# Patient Record
Sex: Male | Born: 1950 | Race: Black or African American | Hispanic: No | Smoking: Never smoker
Health system: Southern US, Community
[De-identification: ages and names within clinical notes are randomized; demographics above are authoritative.]

## PROBLEM LIST (undated history)

## (undated) DIAGNOSIS — I10 Essential (primary) hypertension: Secondary | ICD-10-CM

## (undated) DIAGNOSIS — C61 Malignant neoplasm of prostate: Secondary | ICD-10-CM

## (undated) DIAGNOSIS — K219 Gastro-esophageal reflux disease without esophagitis: Secondary | ICD-10-CM

## (undated) DIAGNOSIS — B1920 Unspecified viral hepatitis C without hepatic coma: Secondary | ICD-10-CM

## (undated) HISTORY — PX: PROSTATE BIOPSY: SHX241

## (undated) HISTORY — DX: Unspecified viral hepatitis C without hepatic coma: B19.20

## (undated) HISTORY — PX: HERNIA REPAIR: SHX51

## (undated) HISTORY — PX: TOTAL HIP ARTHROPLASTY: SHX124

---

## 2000-08-18 ENCOUNTER — Encounter (HOSPITAL_COMMUNITY): Admission: RE | Admit: 2000-08-18 | Discharge: 2000-09-17 | Payer: Self-pay | Admitting: Family Medicine

## 2000-09-09 ENCOUNTER — Encounter: Payer: Self-pay | Admitting: Family Medicine

## 2000-09-09 ENCOUNTER — Ambulatory Visit (HOSPITAL_COMMUNITY): Admission: RE | Admit: 2000-09-09 | Discharge: 2000-09-09 | Payer: Self-pay | Admitting: Family Medicine

## 2000-10-14 ENCOUNTER — Encounter: Payer: Self-pay | Admitting: Family Medicine

## 2000-10-14 ENCOUNTER — Ambulatory Visit (HOSPITAL_COMMUNITY): Admission: RE | Admit: 2000-10-14 | Discharge: 2000-10-14 | Payer: Self-pay | Admitting: Family Medicine

## 2008-07-21 ENCOUNTER — Emergency Department (HOSPITAL_COMMUNITY): Admission: EM | Admit: 2008-07-21 | Discharge: 2008-07-21 | Payer: Self-pay | Admitting: Emergency Medicine

## 2008-09-13 ENCOUNTER — Emergency Department (HOSPITAL_COMMUNITY): Admission: EM | Admit: 2008-09-13 | Discharge: 2008-09-13 | Payer: Self-pay | Admitting: Emergency Medicine

## 2008-12-26 ENCOUNTER — Emergency Department (HOSPITAL_COMMUNITY): Admission: EM | Admit: 2008-12-26 | Discharge: 2008-12-26 | Payer: Self-pay | Admitting: Emergency Medicine

## 2011-09-17 ENCOUNTER — Emergency Department (HOSPITAL_COMMUNITY): Payer: Self-pay

## 2011-09-17 ENCOUNTER — Emergency Department (HOSPITAL_COMMUNITY)
Admission: EM | Admit: 2011-09-17 | Discharge: 2011-09-17 | Disposition: A | Discharge status: Home / Self Care | Payer: Self-pay | Attending: Emergency Medicine | Admitting: Emergency Medicine

## 2011-09-17 ENCOUNTER — Encounter (HOSPITAL_COMMUNITY): Payer: Self-pay | Admitting: *Deleted

## 2011-09-17 DIAGNOSIS — R0602 Shortness of breath: Secondary | ICD-10-CM | POA: Insufficient documentation

## 2011-09-17 DIAGNOSIS — I10 Essential (primary) hypertension: Secondary | ICD-10-CM | POA: Insufficient documentation

## 2011-09-17 DIAGNOSIS — R05 Cough: Secondary | ICD-10-CM

## 2011-09-17 DIAGNOSIS — R059 Cough, unspecified: Secondary | ICD-10-CM | POA: Insufficient documentation

## 2011-09-17 DIAGNOSIS — R079 Chest pain, unspecified: Secondary | ICD-10-CM | POA: Insufficient documentation

## 2011-09-17 DIAGNOSIS — D649 Anemia, unspecified: Secondary | ICD-10-CM | POA: Insufficient documentation

## 2011-09-17 DIAGNOSIS — Z8546 Personal history of malignant neoplasm of prostate: Secondary | ICD-10-CM | POA: Insufficient documentation

## 2011-09-17 HISTORY — DX: Essential (primary) hypertension: I10

## 2011-09-17 HISTORY — DX: Malignant neoplasm of prostate: C61

## 2011-09-17 LAB — BASIC METABOLIC PANEL
BUN: 9 mg/dL (ref 6–23)
CO2: 22 mEq/L (ref 19–32)
Calcium: 9.2 mg/dL (ref 8.4–10.5)
Chloride: 102 mEq/L (ref 96–112)
Creatinine, Ser: 0.82 mg/dL (ref 0.50–1.35)

## 2011-09-17 LAB — CBC
HCT: 26.4 % — ABNORMAL LOW (ref 39.0–52.0)
Hemoglobin: 7.1 g/dL — ABNORMAL LOW (ref 13.0–17.0)
MCH: 17.6 pg — ABNORMAL LOW (ref 26.0–34.0)
MCHC: 26.9 g/dL — ABNORMAL LOW (ref 30.0–36.0)
MCV: 65.5 fL — ABNORMAL LOW (ref 78.0–100.0)
Platelets: 147 10*3/uL — ABNORMAL LOW (ref 150–400)
RBC: 4.03 MIL/uL — ABNORMAL LOW (ref 4.22–5.81)
RDW: 19.4 % — ABNORMAL HIGH (ref 11.5–15.5)
WBC: 5 10*3/uL (ref 4.0–10.5)

## 2011-09-17 MED ORDER — ALBUTEROL SULFATE (5 MG/ML) 0.5% IN NEBU
5.0000 mg | INHALATION_SOLUTION | Freq: Once | RESPIRATORY_TRACT | Status: AC
  Administered 2011-09-17: 5 mg via RESPIRATORY_TRACT
  Filled 2011-09-17: qty 1

## 2011-09-17 MED ORDER — ALBUTEROL SULFATE HFA 108 (90 BASE) MCG/ACT IN AERS
2.0000 | INHALATION_SPRAY | RESPIRATORY_TRACT | Status: DC | PRN
  Administered 2011-09-17: 2 via RESPIRATORY_TRACT
  Filled 2011-09-17: qty 6.7

## 2011-09-17 NOTE — ED Notes (Signed)
C/o cough productive of greenish white phlegm x 1 week or longer; c/o bilateral chest wall pain, worse with cough and deep breaths x 3-4 days

## 2011-09-17 NOTE — ED Notes (Signed)
Amabulated in [place] pt was walking very good, said he was not short of breath like he was.

## 2011-09-17 NOTE — Discharge Instructions (Signed)

## 2011-09-17 NOTE — ED Provider Notes (Signed)
History   This chart was scribed for Joya Gaskins, MD by Clarita Crane. The patient was seen in room APA12/APA12. Patient's care was started at [phone].    CSN: [id number]  Arrival date & time 09/17/11  2841   First MD Initiated Contact with Patient 09/17/11 [phone]      Chief Complaint  Patient presents with  . Cough  . Chest Pain     HPI Jonathan Benitez is a 61 y.o. male who presents to the Emergency Department complaining of waxing and waning moderate chest pain described as soreness with associated cough, SOB, weakness onset 3 weeks ago and persistent since. Patient also notes having blood in stool but states this is baseline for him as a result of his history of crohn's disease. Reports cough has been gradually improving but persistent since he began using Mucinex several days ago. Denies fever, hemoptysis, nausea, vomiting, diarrhea. Patient with h/o HTN, prostate CA, crohn's disease, seasonal allergies. No syncope is reported.  Nothing worsens symptoms.  Rest improves his symptoms He reports chest wall pain only with coughing No hematemesis Past Medical History  Diagnosis Date  . Hypertension   . Prostate ca     History reviewed. No pertinent past surgical history.  No family history on file.  History  Substance Use Topics  . Smoking status: Not on file  . Smokeless tobacco: Not on file  . Alcohol Use:       Review of Systems A complete 10 system review of systems was obtained and all systems are negative except as noted in the HPI and PMH.   Allergies  Review of patient's allergies indicates no known allergies.  Home Medications  No current outpatient prescriptions on file.  BP 137/74  Pulse 114  Temp(Src) 98.3 F (36.8 C) (Oral)  Resp 20  Ht 6' (1.829 m)  Wt 200 lb (90.719 kg)  BMI 27.12 kg/m2  SpO2 99%  Physical Exam CONSTITUTIONAL: Well developed/well nourished HEAD AND FACE: Normocephalic/atraumatic EYES: EOMI/PERRL, conjunctiva pink ENMT:  Mucous membranes moist NECK: supple no meningeal signs SPINE:entire spine nontender CV: S1/S2 noted, no murmurs/rubs/gallops noted Chest - tender to palpation, no crepitance noted LUNGS: decreased BS in the bases, no apparent distress ABDOMEN: soft, nontender, no rebound or guarding REXTAL: hemorrhoids noted, stool color normal, no melena, hemoccult negative, no blood noted, no mass noted NEURO: Pt is awake/alert, moves all extremitiesx4 EXTREMITIES: pulses normal, full ROM, no edema SKIN: warm, color normal PSYCH: no abnormalities of mood noted  ED Course  Procedures  DIAGNOSTIC STUDIES: Oxygen Saturation is 97% on room air, normal by my interpretation.    COORDINATION OF CARE: 7:31AM-Patient informed of current plan for treatment and evaluation and agrees with plan at this time. Patient receiving breathing treatment at this time.  8:08AM- Patient notes SOB improved with administration of breathing treatment. Current clinical impression and plan for treatment discussed with patient. Advised of need for close follow up and return to ED if symptoms persist or worsen. Pt walked in the ED without any complaints, no distress, denied dyspnea on exertion and denies fatigue with walking.  He felt improved with albuterol.  Given chest wall pain from coughing, no further workup needed.  I doubt PE/ACS at this time.  As for anemia, pt reports [facility].  He reports he is supposed to take iron but stopped awhile ago.  No signs of GI bleed at this time.  My suspicion is that this is likely chronic process  and is not acute anemia or acute GI bleed.  [facility], and I encouraged to f/u ASAP.  Also he will restart iron.  He does not appear to be in any distress or for need of blood transfusion at this time.   HR improved but elevated likely due to albuterol.  We discussed strict return precautions.    The patient appears reasonably screened and/or stabilized for  discharge and I doubt any other medical condition or other [facility] requiring further screening, evaluation, or treatment in the ED at this time prior to discharge.    Labs Reviewed  CBC - Abnormal; Notable for the following:    RBC 4.03 (*)    Hemoglobin 7.1 (*)    HCT 26.4 (*)    MCV 65.5 (*)    MCH 17.6 (*)    MCHC 26.9 (*)    RDW 19.4 (*)    Platelets 147 (*)    All other components within normal limits  BASIC METABOLIC PANEL - Abnormal; Notable for the following:    Glucose, Bld 104 (*)    All other components within normal limits   Dg Chest 2 View  09/17/2011  *RADIOLOGY REPORT*  Clinical Data: Cough.  CHEST - 2 VIEW  Comparison: 09/13/2008.  Findings: The cardiac silhouette, mediastinal and hilar contours are within normal limits and stable. The lungs are clear. Minimal basilar scarring.  No pleural effusions.  The bony thorax is intact.  IMPRESSION: No acute cardiopulmonary findings.  Original Report Authenticated By: P. Loralie Champagne, M.D.      MDM  Nursing notes reviewed and considered in documentation xrays reviewed and considered All labs/vitals reviewed and considered       I personally performed the services described in this documentation, which was scribed in my presence. The recorded information has been reviewed and considered.      Joya Gaskins, MD 09/17/11 [phone]

## 2012-06-28 ENCOUNTER — Emergency Department (HOSPITAL_COMMUNITY): Payer: Self-pay

## 2012-06-28 ENCOUNTER — Encounter (HOSPITAL_COMMUNITY): Payer: Self-pay

## 2012-06-28 ENCOUNTER — Observation Stay (HOSPITAL_COMMUNITY)
Admission: EM | Admit: 2012-06-28 | Discharge: 2012-06-29 | Disposition: A | Discharge status: Home / Self Care | Payer: Self-pay | Attending: Internal Medicine | Admitting: Internal Medicine

## 2012-06-28 DIAGNOSIS — D509 Iron deficiency anemia, unspecified: Principal | ICD-10-CM | POA: Insufficient documentation

## 2012-06-28 DIAGNOSIS — R55 Syncope and collapse: Secondary | ICD-10-CM

## 2012-06-28 DIAGNOSIS — F1010 Alcohol abuse, uncomplicated: Secondary | ICD-10-CM

## 2012-06-28 DIAGNOSIS — C61 Malignant neoplasm of prostate: Secondary | ICD-10-CM

## 2012-06-28 DIAGNOSIS — I10 Essential (primary) hypertension: Secondary | ICD-10-CM

## 2012-06-28 DIAGNOSIS — K5090 Crohn's disease, unspecified, without complications: Secondary | ICD-10-CM

## 2012-06-28 DIAGNOSIS — D649 Anemia, unspecified: Secondary | ICD-10-CM

## 2012-06-28 DIAGNOSIS — F1020 Alcohol dependence, uncomplicated: Secondary | ICD-10-CM

## 2012-06-28 DIAGNOSIS — Z8546 Personal history of malignant neoplasm of prostate: Secondary | ICD-10-CM | POA: Insufficient documentation

## 2012-06-28 DIAGNOSIS — R32 Unspecified urinary incontinence: Secondary | ICD-10-CM

## 2012-06-28 DIAGNOSIS — F10929 Alcohol use, unspecified with intoxication, unspecified: Secondary | ICD-10-CM

## 2012-06-28 HISTORY — DX: Gastro-esophageal reflux disease without esophagitis: K21.9

## 2012-06-28 LAB — URINALYSIS, ROUTINE W REFLEX MICROSCOPIC
Ketones, ur: NEGATIVE mg/dL
Leukocytes, UA: NEGATIVE
Nitrite: NEGATIVE
Protein, ur: NEGATIVE mg/dL
pH: 6 (ref 5.0–8.0)

## 2012-06-28 LAB — CBC WITH DIFFERENTIAL/PLATELET
Basophils Absolute: 0.1 10*3/uL (ref 0.0–0.1)
Eosinophils Absolute: 0.1 10*3/uL (ref 0.0–0.7)
HCT: 25.6 % — ABNORMAL LOW (ref 39.0–52.0)
Lymphs Abs: 2 10*3/uL (ref 0.7–4.0)
MCH: 19.4 pg — ABNORMAL LOW (ref 26.0–34.0)
MCHC: 28.9 g/dL — ABNORMAL LOW (ref 30.0–36.0)
MCV: 67.2 fL — ABNORMAL LOW (ref 78.0–100.0)
Monocytes Absolute: 0.3 10*3/uL (ref 0.1–1.0)
Neutro Abs: 1.3 10*3/uL — ABNORMAL LOW (ref 1.7–7.7)
Platelets: 231 10*3/uL (ref 150–400)
RDW: 19.5 % — ABNORMAL HIGH (ref 11.5–15.5)
WBC: 3.8 10*3/uL — ABNORMAL LOW (ref 4.0–10.5)

## 2012-06-28 LAB — COMPREHENSIVE METABOLIC PANEL
AST: 52 U/L — ABNORMAL HIGH (ref 0–37)
Albumin: 3.7 g/dL (ref 3.5–5.2)
BUN: 11 mg/dL (ref 6–23)
Calcium: 8.8 mg/dL (ref 8.4–10.5)
Chloride: 109 mEq/L (ref 96–112)
Creatinine, Ser: 0.88 mg/dL (ref 0.50–1.35)
Total Bilirubin: 0.3 mg/dL (ref 0.3–1.2)
Total Protein: 8.6 g/dL — ABNORMAL HIGH (ref 6.0–8.3)

## 2012-06-28 LAB — ABO/RH: ABO/RH(D): O POS

## 2012-06-28 LAB — ETHANOL: Alcohol, Ethyl (B): 342 mg/dL — ABNORMAL HIGH (ref 0–11)

## 2012-06-28 LAB — IRON AND TIBC: UIBC: >575 ug/dL — ABNORMAL HIGH (ref 125–400)

## 2012-06-28 LAB — GLUCOSE, CAPILLARY: Glucose-Capillary: 86 mg/dL (ref 70–99)

## 2012-06-28 LAB — RETICULOCYTES
RBC.: 3.86 MIL/uL — ABNORMAL LOW (ref 4.22–5.81)
Retic Count, Absolute: 61.8 10*3/uL (ref 19.0–186.0)
Retic Ct Pct: 1.6 % (ref 0.4–3.1)

## 2012-06-28 LAB — FERRITIN: Ferritin: 6 ng/mL — ABNORMAL LOW (ref 22–322)

## 2012-06-28 LAB — FOLATE: Folate: 8.9 ng/mL

## 2012-06-28 MED ORDER — HYDROCHLOROTHIAZIDE 25 MG PO TABS
25.0000 mg | ORAL_TABLET | Freq: Every day | ORAL | Status: DC
  Administered 2012-06-29: 25 mg via ORAL
  Filled 2012-06-28: qty 1

## 2012-06-28 MED ORDER — ONDANSETRON HCL 4 MG PO TABS: 4.0000 mg | ORAL_TABLET | Freq: Four times a day (QID) | ORAL | Status: DC | PRN

## 2012-06-28 MED ORDER — ONDANSETRON HCL 4 MG/2ML IJ SOLN: 4.0000 mg | Freq: Four times a day (QID) | INTRAMUSCULAR | Status: DC | PRN

## 2012-06-28 MED ORDER — OXYMETAZOLINE HCL 0.05 % NA SOLN: 2.0000 | Freq: Two times a day (BID) | NASAL | Status: DC | PRN

## 2012-06-28 MED ORDER — PANTOPRAZOLE SODIUM 40 MG PO TBEC
40.0000 mg | DELAYED_RELEASE_TABLET | Freq: Every day | ORAL | Status: DC
  Administered 2012-06-28 – 2012-06-29 (×2): 40 mg via ORAL
  Filled 2012-06-28 (×2): qty 1

## 2012-06-28 MED ORDER — SODIUM CHLORIDE 0.9 % IV SOLN
INTRAVENOUS | Status: DC
  Administered 2012-06-28: 16:00:00 via INTRAVENOUS

## 2012-06-28 MED ORDER — LISINOPRIL-HYDROCHLOROTHIAZIDE 20-25 MG PO TABS: 1.0000 | ORAL_TABLET | Freq: Every day | ORAL | Status: DC

## 2012-06-28 MED ORDER — LISINOPRIL 10 MG PO TABS
20.0000 mg | ORAL_TABLET | Freq: Every day | ORAL | Status: DC
  Administered 2012-06-29: 20 mg via ORAL
  Filled 2012-06-28: qty 2

## 2012-06-28 NOTE — ED Notes (Signed)
Pt was found lying in the snow from alcohol intoxication. Pt states that has never happed to him before

## 2012-06-28 NOTE — ED Provider Notes (Signed)
History    This chart was scribed for Ward Givens, MD by Melba Coon, ED Scribe. The patient was seen in room APA14/APA14 and the patient's care was started at 12:23PM.    CSN: [id number]  Arrival date & time 06/28/12  1118   First MD Initiated Contact with Patient 06/28/12 1143      Chief complaint syncope  (Consider location/radiation/quality/duration/timing/severity/associated sxs/prior treatment) The history is provided by the patient. No language interpreter was used.   Jonathan Benitez is a 62 y.o. male who presents to the Emergency Department for passing out. He reports he has been drinking alcohol since the 1970's when he was in the [place] War. He has been to detox in the past; last time was 3 years ago. He reports he started drinking at 3:30 AM this morning and drank two 24 oz beers along with other things; he has know idea how much he drank today. He reports today he was walking in the snow and started feeling lightheaded and the next thing he knew he passed out he was laying in the snow. He was able to call 911 on his cell phone. He also reports urinary incontinence for the first time ever today. He states this is the second time he passed out this past month. He states the first time he was with his girlfriend. He states he did not have urinary incontinence that time. He is not sure but thinks she may have said he had some jerking. He does not know if he was jerking or shaking when he had the syncope today; he reports no prior history of seizures. He reports that he now wants to leave and that he enjoys drinking.  Reports chronic right knee pain. Denies HA, fever, neck pain, sore throat, rash, back pain, CP, SOB, abdominal pain, nausea, emesis, diarrhea, dysuria, melena, or extremity edema, weakness, numbness, or tingling. History of GERD, Crohn's disease (reports hematochezia for the past couple of days), and HTN. He reports a history of anemia and blood transfusion last year. No  known allergies. No other pertinent medical symptoms.  PCP: [facility]  Past Medical History  Diagnosis Date  . Hypertension   . Prostate ca     History reviewed. No pertinent past surgical history.  No family history on file.  History  Substance Use Topics  . Smoking status: Not on file  . Smokeless tobacco: Not on file  . Alcohol Use: Yes  He is now retired from [employer].    Review of Systems 10 Systems reviewed and all are negative for acute change except as noted in the HPI.   Allergies  Review of patient's allergies indicates no known allergies.  Home Medications   Current Outpatient Rx  Name  Route  Sig  Dispense  Refill  . lisinopril-hydrochlorothiazide (PRINZIDE,ZESTORETIC) 20-25 MG per tablet   Oral   Take 1 tablet by mouth daily.         Marland Kitchen omeprazole (PRILOSEC) 20 MG capsule   Oral   Take 20 mg by mouth daily.         Marland Kitchen oxymetazoline (AFRIN) 0.05 % nasal spray   Nasal   Place 2 sprays into the nose at bedtime as needed for congestion.           BP 136/79  Pulse 94  Temp(Src) 98.2 F (36.8 C) (Oral)  Ht 6' (1.829 m)  Wt 180 lb (81.647 kg)  BMI 24.41 kg/m2  SpO2 99%  Vital signs  normal   Orthostatic VS negative but patient was unsteady   Physical Exam  Nursing note and vitals reviewed. Constitutional: He is oriented to person, place, and time. He appears well-developed and well-nourished.  Non-toxic appearance. He does not appear ill. No distress.  Appears intoxicated  HENT:  Head: Normocephalic.  Right Ear: External ear normal.  Left Ear: External ear normal.  Nose: Nose normal. No mucosal edema or rhinorrhea.  Mouth/Throat: Oropharynx is clear and moist and mucous membranes are normal. No dental abscesses or edematous.  Dried blood on lower lip but no lesions on the tongue.  Eyes: Conjunctivae and EOM are normal. Pupils are equal, round, and reactive to light.  Neck: Normal range of motion and full passive  range of motion without pain. Neck supple.  Cardiovascular: Normal rate, regular rhythm and normal heart sounds.  Exam reveals no gallop and no friction rub.   No murmur heard. Pulmonary/Chest: Effort normal and breath sounds normal. No respiratory distress. He has no wheezes. He has no rhonchi. He has no rales. He exhibits no tenderness and no crepitus.  Abdominal: Soft. Normal appearance and bowel sounds are normal. He exhibits no distension. There is tenderness. There is no rebound and no guarding.    Mild lower abdominal pain bilaterally.  Genitourinary:  Rectal exam performed with chaperone: Large hemorrhoid from 6 to 12 o' clock. No stool in vault  Musculoskeletal: Normal range of motion. He exhibits tenderness. He exhibits no edema.  Some soreness of the right trapezius. Moves all extremities well.   Neurological: He is alert and oriented to person, place, and time. He has normal strength. No cranial nerve deficit.  Skin: Skin is warm, dry and intact. No rash noted. No erythema. No pallor.  Psychiatric: He has a normal mood and affect. His speech is normal and behavior is normal. His mood appears not anxious.    ED Course  Procedures (including critical care time)  DIAGNOSTIC STUDIES: Oxygen Saturation is 99% on room air, normal by my interpretation.    COORDINATION OF CARE:  12:34PM - head CT without contrast, CBG, CBC with differential, CMP, ETOH, UA, and hemoccult will be ordered for Jonathan Benitez.   1:00PM - lab results reviewed   1:20PM - imaging results reviewed and are relatively unremarkable.  CT head done because patient has history of alcoholism and heavy alcohol use recently. With the urinary incontinence is not clear whether he may have had a unwitnessed seizure or could possibly have had some other underlying intracranial injury such as subdural from falling while intoxicated. His urinary incontinence could also be from the heavy drinking or seizure activity.     Review of chart shows patient was seen in our ED in May of 2013 and at that time was noted to have a hemoglobin of 7. Patient was discharged from the ED and patient states he followed up at the [facility] and was admitted for 3 weeks. He was given 2 units of blood.  14:40 Dr Karilyn Cota will see in ED for admission  Results for orders placed during the hospital encounter of 06/28/12  CBC WITH DIFFERENTIAL      Result Value Range   WBC 3.8 (*) 4.0 - 10.5 K/uL   RBC 3.81 (*) 4.22 - 5.81 MIL/uL   Hemoglobin 7.4 (*) 13.0 - 17.0 g/dL   HCT 16.1 (*) 09.6 - 04.5 %   MCV 67.2 (*) 78.0 - 100.0 fL   MCH 19.4 (*) 26.0 - 34.0 pg  MCHC 28.9 (*) 30.0 - 36.0 g/dL   RDW 40.9 (*) 81.1 - 91.4 %   Platelets 231  150 - 400 K/uL   Neutrophils Relative 34 (*) 43 - 77 %   Lymphocytes Relative 52 (*) 12 - 46 %   Monocytes Relative 9  3 - 12 %   Eosinophils Relative 3  0 - 5 %   Basophils Relative 2 (*) 0 - 1 %   Neutro Abs 1.3 (*) 1.7 - 7.7 K/uL   Lymphs Abs 2.0  0.7 - 4.0 K/uL   Monocytes Absolute 0.3  0.1 - 1.0 K/uL   Eosinophils Absolute 0.1  0.0 - 0.7 K/uL   Basophils Absolute 0.1  0.0 - 0.1 K/uL   RBC Morphology POLYCHROMASIA PRESENT    COMPREHENSIVE METABOLIC PANEL      Result Value Range   Sodium 143  135 - 145 mEq/L   Potassium 3.6  3.5 - 5.1 mEq/L   Chloride 109  96 - 112 mEq/L   CO2 22  19 - 32 mEq/L   Glucose, Bld 92  70 - 99 mg/dL   BUN 11  6 - 23 mg/dL   Creatinine, Ser 7.82  0.50 - 1.35 mg/dL   Calcium 8.8  8.4 - 95.6 mg/dL   Total Protein 8.6 (*) 6.0 - 8.3 g/dL   Albumin 3.7  3.5 - 5.2 g/dL   AST 52 (*) 0 - 37 U/L   ALT 35  0 - 53 U/L   Alkaline Phosphatase 51  39 - 117 U/L   Total Bilirubin 0.3  0.3 - 1.2 mg/dL   GFR calc non Af Amer >90  >90 mL/min   GFR calc Af Amer >90  >90 mL/min  ETHANOL      Result Value Range   Alcohol, Ethyl (B) 342 (*) 0 - 11 mg/dL  URINALYSIS, ROUTINE W REFLEX MICROSCOPIC      Result Value Range   Color, Urine STRAW (*) YELLOW   APPearance CLEAR   CLEAR   Specific Gravity, Urine <1.005 (*) 1.005 - 1.030   pH 6.0  5.0 - 8.0   Glucose, UA NEGATIVE  NEGATIVE mg/dL   Hgb urine dipstick NEGATIVE  NEGATIVE   Bilirubin Urine NEGATIVE  NEGATIVE   Ketones, ur NEGATIVE  NEGATIVE mg/dL   Protein, ur NEGATIVE  NEGATIVE mg/dL   Urobilinogen, UA 0.2  0.0 - 1.0 mg/dL   Nitrite NEGATIVE  NEGATIVE   Leukocytes, UA NEGATIVE  NEGATIVE  GLUCOSE, CAPILLARY      Result Value Range   Glucose-Capillary 86  70 - 99 mg/dL   Hemoccult negative  Laboratory interpretation all normal except alcohol intoxication and anemia   Ct Head Wo Contrast  06/28/2012  *RADIOLOGY REPORT*  Clinical Data: Syncopal episode today. Possible seizure.  CT HEAD WITHOUT CONTRAST  Technique:  Contiguous axial images were obtained from the base of the skull through the vertex without contrast.  Comparison: None.  Findings: There is no evidence for acute infarction, intracranial hemorrhage, mass lesion, hydrocephalus, or extra-axial fluid.  Mild atrophy.  Mild chronic microvascular ischemic change.  Slight vascular calcification.  Calvarium intact.  No scalp hematoma. Clear sinuses and mastoids.  IMPRESSION: Mild atrophy.  No acute intracranial findings.  No skull fracture or intracranial hemorrhage.   Original Report Authenticated By: Davonna Belling, M.D.      1. Anemia   2. Alcohol abuse   3. Alcohol intoxication   4. Syncope   5. Urinary incontinence  Plan admission   Devoria Albe, MD, FACEP    MDM   I personally performed the services described in this documentation, which was scribed in my presence. The recorded information has been reviewed and considered.  Devoria Albe, MD, Jonathan Benitez        Ward Givens, MD 06/28/12 [phone]

## 2012-06-28 NOTE — H&P (Signed)
[facility] History and Physical  Jonathan Benitez XLK:[id number] DOB: 1950/08/03 DOA: 06/28/2012     Chief Complaint: Syncope,alcohol intoxication  HPI: Jonathan Benitez is a 62 y.o. male who presents with syncopal episode related to excess alcohol intake and possible seizure. He reports today he was walking in the snow and started feeling lightheaded and the next thing he knew he passed out he was laying in the snow. He was able to call 911 on his cell phone. He also reports urinary incontinence for the first time ever today. He states this is the second time he passed out this past month. He normally drinks beer daily.He has Crohn's disease and is due to be referred by his PCP at the [facility] to gastroenterology.He also has h/o prostrate cancer.He denies rectal bleeding or hematemesis.   Review of Systems: Apart from HPI,other systems negative.  Past Medical History  Diagnosis Date  . Hypertension   . Prostate ca    History reviewed. No pertinent past surgical history. Social History:  Lives alone,alcoholic,non-smoker except marijuana.  No Known Allergies  No family history on file. Non-contributory.   Prior to Admission medications   Medication Sig Start Date End Date Taking? Authorizing Provider  lisinopril-hydrochlorothiazide (PRINZIDE,ZESTORETIC) 20-25 MG per tablet Take 1 tablet by mouth daily.   Yes Historical Provider, MD  omeprazole (PRILOSEC) 20 MG capsule Take 20 mg by mouth daily.   Yes Historical Provider, MD  oxymetazoline (AFRIN) 0.05 % nasal spray Place 2 sprays into the nose at bedtime as needed for congestion.   Yes Historical Provider, MD   Physical Exam: Filed Vitals:   06/28/12 1134 06/28/12 1355 06/28/12 1355 06/28/12 1357  BP:  133/79 139/77 139/83  Pulse:  75 80 75  Temp:      TempSrc:      Height:      Weight:      SpO2: 99%        General:  Looks pale  Eyes: Pallor.No jaundice  ENT: WNL  Neck: WNL  Cardiovascular: WNL  Respiratory:  Clear  Abdomen: Soft,non-tender,no masses  Skin: no rash  Musculoskeletal: WNL  Psychiatric: Appropriate affect  Neurologic: non-focal.No delerium.  Labs on Admission:  Basic Metabolic Panel:  Recent Labs Lab 06/28/12 1152  NA 143  K 3.6  CL 109  CO2 22  GLUCOSE 92  BUN 11  CREATININE 0.88  CALCIUM 8.8   Liver Function Tests:  Recent Labs Lab 06/28/12 1152  AST 52*  ALT 35  ALKPHOS 51  BILITOT 0.3  PROT 8.6*  ALBUMIN 3.7     CBC:  Recent Labs Lab 06/28/12 1152  WBC 3.8*  NEUTROABS 1.3*  HGB 7.4*  HCT 25.6*  MCV 67.2*  PLT 231     CBG:  Recent Labs Lab 06/28/12 1203  GLUCAP 86    Radiological Exams on Admission: Ct Head Wo Contrast  06/28/2012  *RADIOLOGY REPORT*  Clinical Data: Syncopal episode today. Possible seizure.  CT HEAD WITHOUT CONTRAST  Technique:  Contiguous axial images were obtained from the base of the skull through the vertex without contrast.  Comparison: None.  Findings: There is no evidence for acute infarction, intracranial hemorrhage, mass lesion, hydrocephalus, or extra-axial fluid.  Mild atrophy.  Mild chronic microvascular ischemic change.  Slight vascular calcification.  Calvarium intact.  No scalp hematoma. Clear sinuses and mastoids.  IMPRESSION: Mild atrophy.  No acute intracranial findings.  No skull fracture or intracranial hemorrhage.   Original Report Authenticated By: Davonna Belling, M.D.  Assessment/Plan   1. Syncope,?seizure related to alcohol intoxication. 2. Microcytic anemia,likely GI bleed,chronic. 3. HTN 4. Prostate cancer  PLAN; 1.Admit. 2.2 units blood. 3.Possible discharge tomorrow,to follow up as outpatient at [facility].   Code Status: FULL CODE  Family Communication: Discussed plan with patient at bedside.   Disposition Plan: Home when medically stable   Time spent: 45 mins  Wilson Singer [facility] Pager [phone]  If 7PM-7AM, please contact  night-coverage [email or web address] Password [facility] 06/28/2012, 2:58 PM

## 2012-06-29 LAB — CBC
HCT: 33.1 % — ABNORMAL LOW (ref 39.0–52.0)
MCHC: 29.9 g/dL — ABNORMAL LOW (ref 30.0–36.0)
Platelets: 251 10*3/uL (ref 150–400)
RDW: 20.7 % — ABNORMAL HIGH (ref 11.5–15.5)
WBC: 6.3 10*3/uL (ref 4.0–10.5)

## 2012-06-29 LAB — COMPREHENSIVE METABOLIC PANEL
ALT: 33 U/L (ref 0–53)
Alkaline Phosphatase: 56 U/L (ref 39–117)
BUN: 8 mg/dL (ref 6–23)
CO2: 25 mEq/L (ref 19–32)
Chloride: 101 mEq/L (ref 96–112)
GFR calc Af Amer: >90 mL/min (ref >90)
Glucose, Bld: 86 mg/dL (ref 70–99)
Potassium: 3.3 mEq/L — ABNORMAL LOW (ref 3.5–5.1)
Sodium: 138 mEq/L (ref 135–145)
Total Bilirubin: 0.9 mg/dL (ref 0.3–1.2)
Total Protein: 9.3 g/dL — ABNORMAL HIGH (ref 6.0–8.3)

## 2012-06-29 LAB — TYPE AND SCREEN
Antibody Screen: NEGATIVE
Unit division: 0

## 2012-06-29 LAB — PROTIME-INR: INR: 1.06 (ref 0.00–1.49)

## 2012-06-29 MED ORDER — POTASSIUM CHLORIDE CRYS ER 20 MEQ PO TBCR
40.0000 meq | EXTENDED_RELEASE_TABLET | Freq: Once | ORAL | Status: AC
  Administered 2012-06-29: 40 meq via ORAL
  Filled 2012-06-29: qty 2

## 2012-06-29 NOTE — Discharge Summary (Signed)
Physician Discharge Summary  Jonathan Benitez JYN:[id number] DOB: 13-Dec-1950 DOA: 06/28/2012  [facility]  Admit date: 06/28/2012 Discharge date: 06/29/2012  Time spent: Less than 30 minutes  Recommendations for Outpatient Follow-up:  1. [facility].  Discharge Diagnoses:  1. Iron deficiency anemia, status post 2 units blood transfusion. No active GI bleeding. 2. Alcohol intoxication. No signs of alcohol withdrawal. 3. Hypertension. 4. History prostate cancer, stable.   Discharge Condition: Stable.  Diet recommendation: Regular. No alcohol.  Filed Weights   06/28/12 1128  Weight: 81.647 kg (180 lb)    History of present illness:  This 62 year old veteran presented to the hospital yesterday with a syncopal episode probably related to seizure from alcohol excess. He is known to have a history of alcohol abuse. He is also known to have Crohn's disease. When he presented to the emergency room, he was fully conscious, no post seizure syndrome and he was found to be anemic with a hemoglobin of 7.4, microcytic. Hematinics confirm the presence of iron deficiency anemia. He was given 2 units blood transfusion. He feels well this morning and is keen to go home. He promised me [facility]. He was counseled against alcohol excess. CT scan of his brain did not show any acute pathology. He is stable for discharge .  Hospital Course:  As above.  Procedures:  None.   Consultations:  None.  Discharge Exam: Filed Vitals:   06/28/12 2248 06/28/12 2348 06/29/12 0605 06/29/12 1034  BP: 152/83 162/88 152/92 169/96  Pulse: 85 80 75   Temp: 98.4 F (36.9 C) 98.5 F (36.9 C) 98.4 F (36.9 C)   TempSrc: Oral Oral Oral   Resp: 18 18 20    Height:      Weight:      SpO2: 100% 100% 100%     General: He looks systemically well. He is alert and orientated. Cardiovascular: Heart sounds are present without  murmurs or gallop rhythm. Respiratory: Lung fields are clear.  Discharge Instructions  Discharge Orders   Future Orders Complete By Expires     Diet - low sodium heart healthy  As directed     Increase activity slowly  As directed         Medication List    TAKE these medications       lisinopril-hydrochlorothiazide 20-25 MG per tablet  Commonly known as:  PRINZIDE,ZESTORETIC  Take 1 tablet by mouth daily.     omeprazole 20 MG capsule  Commonly known as:  PRILOSEC  Take 20 mg by mouth daily.     oxymetazoline 0.05 % nasal spray  Commonly known as:  AFRIN  Place 2 sprays into the nose at bedtime as needed for congestion.          The results of significant diagnostics from this hospitalization (including imaging, microbiology, ancillary and laboratory) are listed below for reference.    Significant Diagnostic Studies: Ct Head Wo Contrast  06/28/2012  *RADIOLOGY REPORT*  Clinical Data: Syncopal episode today. Possible seizure.  CT HEAD WITHOUT CONTRAST  Technique:  Contiguous axial images were obtained from the base of the skull through the vertex without contrast.  Comparison: None.  Findings: There is no evidence for acute infarction, intracranial hemorrhage, mass lesion, hydrocephalus, or extra-axial fluid.  Mild atrophy.  Mild chronic microvascular ischemic change.  Slight vascular calcification.  Calvarium intact.  No scalp hematoma. Clear sinuses and mastoids.  IMPRESSION: Mild  atrophy.  No acute intracranial findings.  No skull fracture or intracranial hemorrhage.   Original Report Authenticated By: Davonna Belling, M.D.     Microbiology:    Labs: Basic Metabolic Panel:  Recent Labs Lab 06/28/12 1152 06/29/12 0540  NA 143 138  K 3.6 3.3*  CL 109 101  CO2 22 25  GLUCOSE 92 86  BUN 11 8  CREATININE 0.88 0.70  CALCIUM 8.8 8.9   Liver Function Tests:  Recent Labs Lab 06/28/12 1152 06/29/12 0540  AST 52* 44*  ALT 35 33  ALKPHOS 51 56  BILITOT 0.3 0.9   PROT 8.6* 9.3*  ALBUMIN 3.7 3.9     CBC:  Recent Labs Lab 06/28/12 1152 06/29/12 0540  WBC 3.8* 6.3  NEUTROABS 1.3*  --   HGB 7.4* 9.9*  HCT 25.6* 33.1*  MCV 67.2* 69.5*  PLT 231 251     CBG:  Recent Labs Lab 06/28/12 1203  GLUCAP 86       Signed:  GOSRANI,NIMISH C  [facility] 06/29/2012, 11:00 AM

## 2012-06-29 NOTE — Progress Notes (Signed)
UR Chart Review Completed  

## 2012-06-29 NOTE — Progress Notes (Signed)
Discharge instructions given to pt. With teach back given to RN. Pt. Taken to car via W/C. 

## 2013-03-02 ENCOUNTER — Encounter (HOSPITAL_COMMUNITY): Payer: Self-pay | Admitting: Emergency Medicine

## 2013-03-02 ENCOUNTER — Emergency Department (HOSPITAL_COMMUNITY)
Admission: EM | Admit: 2013-03-02 | Discharge: 2013-03-02 | Disposition: A | Discharge status: Home / Self Care | Payer: Non-veteran care | Attending: Emergency Medicine | Admitting: Emergency Medicine

## 2013-03-02 DIAGNOSIS — K029 Dental caries, unspecified: Secondary | ICD-10-CM | POA: Insufficient documentation

## 2013-03-02 DIAGNOSIS — Z8546 Personal history of malignant neoplasm of prostate: Secondary | ICD-10-CM | POA: Insufficient documentation

## 2013-03-02 DIAGNOSIS — K219 Gastro-esophageal reflux disease without esophagitis: Secondary | ICD-10-CM | POA: Insufficient documentation

## 2013-03-02 DIAGNOSIS — K047 Periapical abscess without sinus: Secondary | ICD-10-CM | POA: Insufficient documentation

## 2013-03-02 DIAGNOSIS — I10 Essential (primary) hypertension: Secondary | ICD-10-CM | POA: Insufficient documentation

## 2013-03-02 DIAGNOSIS — Z79899 Other long term (current) drug therapy: Secondary | ICD-10-CM | POA: Insufficient documentation

## 2013-03-02 MED ORDER — PENICILLIN V POTASSIUM 500 MG PO TABS: 500.0000 mg | ORAL_TABLET | Freq: Four times a day (QID) | ORAL | Status: DC

## 2013-03-02 NOTE — ED Notes (Signed)
Pt states he has an "abscess" to left lower. Nad. No obvious swelling noted.

## 2013-03-02 NOTE — ED Provider Notes (Signed)
This chart was scribed for Layla Maw Ward, DO by Caryn Bee, ED Scribe. This patient was seen in room APA15/APA15.   TIME SEEN: 7:28  CHIEF COMPLAINT: Dental Pain  HPI: Patient is a 62 y.o. M with h/o HTN who presents to ED with throbbing, mild, constant lower left dental pain. Pt believes the pain is due to an abscess forming. He states that he has had similar symptoms before and will get his dentist to extract the tooth. Pt denies fever, nausea or vomiting, diarrhea, abdominal pain, or any other symptoms. Pt sees PCP at [facility]. Patient denies wanting pain medication at this time. Denies any aggravating or alleviating factors. No radiation.  ROS: See HPI Constitutional: no fever  Eyes: no drainage  ENT: no runny nose   Cardiovascular:  no chest pain  Resp: no SOB  GI: no vomiting GU: no dysuria Integumentary: no rash  Allergy: no hives  Musculoskeletal: no leg swelling  Neurological: no slurred speech ROS otherwise negative  PAST MEDICAL HISTORY/PAST SURGICAL HISTORY:  Past Medical History  Diagnosis Date  . Hypertension   . Prostate ca   . GERD (gastroesophageal reflux disease)     MEDICATIONS:  Prior to Admission medications   Medication Sig Start Date End Date Taking? Authorizing Provider  lisinopril-hydrochlorothiazide (PRINZIDE,ZESTORETIC) 20-25 MG per tablet Take 1 tablet by mouth daily.    Historical Provider, MD  omeprazole (PRILOSEC) 20 MG capsule Take 20 mg by mouth daily.    Historical Provider, MD  oxymetazoline (AFRIN) 0.05 % nasal spray Place 2 sprays into the nose at bedtime as needed for congestion.    Historical Provider, MD    ALLERGIES:  No Known Allergies  SOCIAL HISTORY:  History  Substance Use Topics  . Smoking status: Never Smoker   . Smokeless tobacco: Not on file  . Alcohol Use: Yes     Comment: vodka on weekends, beer 2 24oz cans/day    FAMILY HISTORY: Family History  Problem Relation Age of Onset  . Hypertension Mother   .  Hypertension Father   . Stroke Father     EXAM: Triage Vitals: BP 114/97  Pulse 97  Temp(Src) 98.1 F (36.7 C) (Oral)  Resp 19  SpO2 100% CONSTITUTIONAL: Alert and oriented and responds appropriately to questions. Well-appearing; well-nourished HEAD: Normocephalic EYES: Conjunctivae clear, PERRL ENT: normal nose; no rhinorrhea; moist mucous membranes; pharynx without lesions noted, no uvular deviation or tonsillar hypertrophy or exudate, no trismus, no drooling; left lower third molar decay with associated gingival inflammation, no purulent drainage; no obvious abscess is amenable to drainage in the ED NECK: Supple, no meningismus, no LAD  CARD: RRR; S1 and S2 appreciated; no murmurs, no clicks, no rubs, no gallops RESP: Normal chest excursion without splinting or tachypnea; breath sounds clear and equal bilaterally; no wheezes, no rhonchi, no rales,  ABD/GI: Normal bowel sounds; non-distended; soft, non-tender, no rebound, no guarding BACK:  The back appears normal and is non-tender to palpation, there is no CVA tenderness EXT: Normal ROM in all joints; non-tender to palpation; no edema; normal capillary refill; no cyanosis    SKIN: Normal color for age and race; warm NEURO: Moves all extremities equally PSYCH: The patient's mood and manner are appropriate. Grooming and personal hygiene are appropriate.  MEDICAL DECISION MAKING: Patient with likely early dental abscess. He has a Education officer, community for followup. He is hemodynamically stable. No signs of facial cellulitis. No signs of Ludwig angina. No trismus or difficulty swallowing his secretions. Will give  patient prescription for penicillin, return precautions, dental followup information. He denies wanting any pain medication at this time. Instructed patient to alternate between ibuprofen and Tylenol over-the-counter. Patient verbalized understanding and is comfortable with this plan.      Layla Maw Ward, DO 03/02/13 [phone]

## 2013-12-17 ENCOUNTER — Emergency Department (HOSPITAL_COMMUNITY): Payer: Non-veteran care

## 2013-12-17 ENCOUNTER — Emergency Department (HOSPITAL_COMMUNITY)
Admission: EM | Admit: 2013-12-17 | Discharge: 2013-12-17 | Disposition: A | Discharge status: Home / Self Care | Payer: Non-veteran care | Attending: Emergency Medicine | Admitting: Emergency Medicine

## 2013-12-17 ENCOUNTER — Encounter (HOSPITAL_COMMUNITY): Payer: Self-pay | Admitting: Emergency Medicine

## 2013-12-17 DIAGNOSIS — Z792 Long term (current) use of antibiotics: Secondary | ICD-10-CM | POA: Insufficient documentation

## 2013-12-17 DIAGNOSIS — K219 Gastro-esophageal reflux disease without esophagitis: Secondary | ICD-10-CM | POA: Insufficient documentation

## 2013-12-17 DIAGNOSIS — R1032 Left lower quadrant pain: Secondary | ICD-10-CM | POA: Insufficient documentation

## 2013-12-17 DIAGNOSIS — I10 Essential (primary) hypertension: Secondary | ICD-10-CM | POA: Insufficient documentation

## 2013-12-17 DIAGNOSIS — Z79899 Other long term (current) drug therapy: Secondary | ICD-10-CM | POA: Insufficient documentation

## 2013-12-17 DIAGNOSIS — Z8546 Personal history of malignant neoplasm of prostate: Secondary | ICD-10-CM | POA: Insufficient documentation

## 2013-12-17 LAB — CBC WITH DIFFERENTIAL/PLATELET
BASOS PCT: 1 % (ref 0–1)
Basophils Absolute: 0 10*3/uL (ref 0.0–0.1)
Eosinophils Absolute: 0.1 10*3/uL (ref 0.0–0.7)
Eosinophils Relative: 1 % (ref 0–5)
HCT: 36.9 % — ABNORMAL LOW (ref 39.0–52.0)
HEMOGLOBIN: 11.9 g/dL — AB (ref 13.0–17.0)
Lymphocytes Relative: 29 % (ref 12–46)
Lymphs Abs: 1.3 10*3/uL (ref 0.7–4.0)
MCH: 26.9 pg (ref 26.0–34.0)
MCHC: 32.2 g/dL (ref 30.0–36.0)
MCV: 83.3 fL (ref 78.0–100.0)
MONOS PCT: 18 % — AB (ref 3–12)
Monocytes Absolute: 0.8 10*3/uL (ref 0.1–1.0)
NEUTROS PCT: 51 % (ref 43–77)
Neutro Abs: 2.2 10*3/uL (ref 1.7–7.7)
Platelets: 178 10*3/uL (ref 150–400)
RBC: 4.43 MIL/uL (ref 4.22–5.81)
RDW: 19.6 % — ABNORMAL HIGH (ref 11.5–15.5)
WBC: 4.3 10*3/uL (ref 4.0–10.5)

## 2013-12-17 LAB — URINALYSIS, ROUTINE W REFLEX MICROSCOPIC
Bilirubin Urine: NEGATIVE
GLUCOSE, UA: NEGATIVE mg/dL
Hgb urine dipstick: NEGATIVE
Ketones, ur: NEGATIVE mg/dL
LEUKOCYTES UA: NEGATIVE
Nitrite: NEGATIVE
PH: 6 (ref 5.0–8.0)
Protein, ur: NEGATIVE mg/dL
SPECIFIC GRAVITY, URINE: 1.01 (ref 1.005–1.030)
Urobilinogen, UA: 0.2 mg/dL (ref 0.0–1.0)

## 2013-12-17 LAB — COMPREHENSIVE METABOLIC PANEL
ALBUMIN: 3.7 g/dL (ref 3.5–5.2)
ALK PHOS: 59 U/L (ref 39–117)
ALT: 43 U/L (ref 0–53)
AST: 54 U/L — AB (ref 0–37)
Anion gap: 12 (ref 5–15)
BILIRUBIN TOTAL: 0.3 mg/dL (ref 0.3–1.2)
BUN: 7 mg/dL (ref 6–23)
CHLORIDE: 101 meq/L (ref 96–112)
CO2: 24 mEq/L (ref 19–32)
Calcium: 9.1 mg/dL (ref 8.4–10.5)
Creatinine, Ser: 0.68 mg/dL (ref 0.50–1.35)
GFR calc Af Amer: >90 mL/min (ref >90)
GFR calc non Af Amer: >90 mL/min (ref >90)
Glucose, Bld: 110 mg/dL — ABNORMAL HIGH (ref 70–99)
POTASSIUM: 3.2 meq/L — AB (ref 3.7–5.3)
SODIUM: 137 meq/L (ref 137–147)
Total Protein: 8.7 g/dL — ABNORMAL HIGH (ref 6.0–8.3)

## 2013-12-17 LAB — TROPONIN I: Troponin I: <0.3 ng/mL (ref <0.30)

## 2013-12-17 LAB — LIPASE, BLOOD: Lipase: 44 U/L (ref 11–59)

## 2013-12-17 LAB — LACTIC ACID, PLASMA: Lactic Acid, Venous: 0.8 mmol/L (ref 0.5–2.2)

## 2013-12-17 MED ORDER — HYDROGEN PEROXIDE 3 % EX SOLN
CUTANEOUS | Status: AC
  Filled 2013-12-17: qty 473

## 2013-12-17 MED ORDER — SODIUM CHLORIDE 0.9 % IV SOLN
Freq: Once | INTRAVENOUS | Status: AC
  Administered 2013-12-17: 75 mL via INTRAVENOUS

## 2013-12-17 MED ORDER — POTASSIUM CHLORIDE CRYS ER 20 MEQ PO TBCR
40.0000 meq | EXTENDED_RELEASE_TABLET | Freq: Once | ORAL | Status: AC
  Administered 2013-12-17: 40 meq via ORAL
  Filled 2013-12-17: qty 2

## 2013-12-17 NOTE — ED Notes (Signed)
Patient c/o left sided abdominal pain since Sunday; states has had some belching, but denies nausea, vomiting, diarrhea or any urinary symptoms.

## 2013-12-17 NOTE — ED Notes (Signed)
Pt. Reports left side intermittent cramping feeling on left side since last Sunday. Pt. Denies N/V/D. Pt. Reports increased belching and gas.

## 2013-12-17 NOTE — ED Notes (Signed)
Patient transported to X-ray 

## 2013-12-17 NOTE — ED Notes (Signed)
Discharge instructions reviewed with pt, questions answered. Pt verbalized understanding.  

## 2013-12-17 NOTE — ED Provider Notes (Signed)
CSN: [id number]     Arrival date & time 12/17/13  0622 History  This chart was scribed for Sharyon Cable, MD by Erling Conte, ED Scribe. This patient was seen in room APA05/APA05 and the patient's care was started at 7:22 AM.    Chief Complaint  Patient presents with  . Abdominal Pain      Patient is a 63 y.o. male presenting with abdominal pain. The history is provided by the patient. No language interpreter was used.  Abdominal Pain Pain location:  LLQ Pain quality: cramping   Pain radiates to:  Chest Pain severity:  Mild Onset quality:  Gradual Duration:  5 days Timing:  Intermittent Progression:  Waxing and waning Chronicity:  New Relieved by:  Nothing Worsened by:  Movement Ineffective treatments:  None tried Associated symptoms: belching and flatus   Associated symptoms: no chills, no cough, no diarrhea, no dysuria, no fever, no hematochezia, no hematuria, no melena, no nausea, no shortness of breath and no vomiting    HPI Comments: Jonathan Benitez is a 63 y.o. male with a h/o HTN, prostate cancer and GERD who presents to the Emergency Department complaining of intermittent, mild, "cramping", "5/10" LLQ abdominal pain for 5 days. Patient states that the pain radiates from his lower abdomen up to the left side of his chest. States that the pain never starts in his chest. He states he is having associated diaphoresis, belching and increased flatulence. Patient has not tried anything for the pain. States pain is exacerbated by movement. He states he has been having normal bowel movements. He denies any diarrhea, fever, emesis, cough, SOB, dysuria, hematuria, hematochezia, melena.     Past Medical History  Diagnosis Date  . Hypertension   . Prostate ca   . GERD (gastroesophageal reflux disease)    History reviewed. No pertinent past surgical history. Family History  Problem Relation Age of Onset  . Hypertension Mother   . Hypertension Father   . Stroke Father     History  Substance Use Topics  . Smoking status: Never Smoker   . Smokeless tobacco: Not on file  . Alcohol Use: Yes     Comment: "no alcohol in 6months"    Review of Systems  Constitutional: Negative for fever and chills.  Respiratory: Negative for cough and shortness of breath.   Gastrointestinal: Positive for abdominal pain and flatus. Negative for nausea, vomiting, diarrhea, melena and hematochezia.  Genitourinary: Negative for dysuria and hematuria.  All other systems reviewed and are negative.     Allergies  Review of patient's allergies indicates no known allergies.  Home Medications   Prior to Admission medications   Medication Sig Start Date End Date Taking? Authorizing Provider  lisinopril-hydrochlorothiazide (PRINZIDE,ZESTORETIC) 20-25 MG per tablet Take 1 tablet by mouth daily.   Yes Historical Provider, MD  omeprazole (PRILOSEC) 20 MG capsule Take 20 mg by mouth daily.   Yes Historical Provider, MD  oxymetazoline (AFRIN) 0.05 % nasal spray Place 2 sprays into the nose at bedtime as needed for congestion.   Yes Historical Provider, MD  traMADol (ULTRAM) 50 MG tablet Take by mouth 3 (three) times daily.   Yes Historical Provider, MD  penicillin v potassium (VEETID) 500 MG tablet Take 1 tablet (500 mg total) by mouth 4 (four) times daily. 03/02/13   Kristen N Ward, DO   Triage Vitals: BP 143/90  Pulse 84  Temp(Src) 98.5 F (36.9 C) (Oral)  Resp 16  Ht 6' (1.829 m)  [employer] [facility]  197 lb (89.359 kg)  BMI 26.71 kg/m2  SpO2 100%  Physical Exam  Nursing note and vitals reviewed.  CONSTITUTIONAL: Well developed/well nourished HEAD: Normocephalic/atraumatic EYES: EOMI/PERRL ENMT: Mucous membranes moist NECK: supple no meningeal signs SPINE:entire spine nontender CV: S1/S2 noted, no murmurs/rubs/gallops noted LUNGS: Lungs are clear to auscultation bilaterally, no apparent distress ABDOMEN: soft, nontender, no rebound or guarding GU:no cva tenderness. No hernia, no  scrotal tenderness. Chaperone present NEURO: Pt is awake/alert, moves all extremitiesx4 EXTREMITIES: pulses normal, full ROM SKIN: warm, color normal PSYCH: no abnormalities of mood noted   ED Course  Procedures   DIAGNOSTIC STUDIES: Oxygen Saturation is 100% on RA, normal by my interpretation.    COORDINATION OF CARE: 7:31 AM- Will order CXR, EKG, and diagnostic labs. Pt advised of plan for treatment and pt agrees Pt well appearing He insists his pain starts in LLQ and radiates into chest, and usually not severe.  He denies SOB.  I have low suspicion for ACS.  His abdomen is soft and nontender.  Workup pending at this time   Pt improved, well appearing, no distress I feel he is safe/stable for d/c home We discussed strict return precautions Advise close [facility]  Labs Review Labs Reviewed  CBC WITH DIFFERENTIAL - Abnormal; Notable for the following:    Hemoglobin 11.9 (*)    HCT 36.9 (*)    RDW 19.6 (*)    Monocytes Relative 18 (*)    All other components within normal limits  COMPREHENSIVE METABOLIC PANEL - Abnormal; Notable for the following:    Potassium 3.2 (*)    Glucose, Bld 110 (*)    Total Protein 8.7 (*)    AST 54 (*)    All other components within normal limits  LIPASE, BLOOD  LACTIC ACID, PLASMA  TROPONIN I  URINALYSIS, ROUTINE W REFLEX MICROSCOPIC    Imaging Review Dg Chest 2 View  12/17/2013   CLINICAL DATA:  Shortness of breath and left-sided chest pain.  EXAM: CHEST  2 VIEW  COMPARISON:  09/17/2011  FINDINGS: Cardiomediastinal silhouette is within normal limits. Thoracic aortic calcification is again seen. The lungs are well inflated. Minimal opacity in the left lung base is unchanged and may reflect scarring. Lungs are otherwise clear. No pleural effusion or pneumothorax is seen. No acute osseous abnormality is identified.  IMPRESSION: No active cardiopulmonary disease.   Electronically Signed   By: Logan Bores   On: 12/17/2013 07:20      EKG Interpretation   Date/Time:  Friday December 17 2013 06:43:31 EDT Ventricular Rate:  84 PR Interval:  171 QRS Duration: 95 QT Interval:  370 QTC Calculation: 437 R Axis:   -9 Text Interpretation:  Sinus rhythm Borderline T wave abnormalities  borderline LVH Abnormal ekg No old tracing to compare Confirmed by MILLER   MD, [place] ([id number]) on 12/17/2013 7:01:01 AM      MDM   Final diagnoses:  Left lower quadrant pain    Nursing notes including past medical history and social history reviewed and considered in documentation xrays reviewed and considered Labs/vital reviewed and considered  I personally performed the services described in this documentation, which was scribed in my presence. The recorded information has been reviewed and is accurate.       Sharyon Cable, MD 12/17/13 [phone]

## 2013-12-17 NOTE — ED Provider Notes (Signed)
MSE was initiated and I personally evaluated the patient and placed orders (if any) at  6:41 AM on December 17, 2013.  Approximately one week of abdominal discomfort on the left side of the abdomen. He points to the left lower quadrant and states that he has a feeling of gas, distention, abdominal discomfort which radiates up into the left side of his chest. He has had associated diaphoresis which is much more than normal. He denies swelling of the legs, coughing, shortness of breath, nausea, vomiting, diarrhea or blood in his stools. His appetite has been normal. He denies a history of cardiac disease, denies a history of abdominal surgery. On exam he has a soft abdomen which does not appear distended, has no tympanitic sounds to percussion, has no guarding. His lungs and heart normal sounds  Initial laboratory workup and chest x-ray have been ordered including a troponin and an EKG.  Filed Vitals:   12/17/13 0635  BP: 152/104  Pulse: 106  Temp: 98.5 F (36.9 C)  Resp: 20     The patient appears stable so that the remainder of the MSE may be completed by another provider.  Johnna Acosta, MD 12/17/13 [phone]

## 2013-12-17 NOTE — Discharge Instructions (Signed)

## 2014-10-29 ENCOUNTER — Emergency Department (HOSPITAL_COMMUNITY)
Admission: EM | Admit: 2014-10-29 | Discharge: 2014-10-29 | Disposition: A | Discharge status: Home / Self Care | Payer: Non-veteran care | Attending: Emergency Medicine | Admitting: Emergency Medicine

## 2014-10-29 ENCOUNTER — Encounter (HOSPITAL_COMMUNITY): Payer: Self-pay | Admitting: *Deleted

## 2014-10-29 DIAGNOSIS — K219 Gastro-esophageal reflux disease without esophagitis: Secondary | ICD-10-CM | POA: Diagnosis not present

## 2014-10-29 DIAGNOSIS — W540XXA Bitten by dog, initial encounter: Secondary | ICD-10-CM | POA: Diagnosis not present

## 2014-10-29 DIAGNOSIS — Y9389 Activity, other specified: Secondary | ICD-10-CM | POA: Insufficient documentation

## 2014-10-29 DIAGNOSIS — Z79899 Other long term (current) drug therapy: Secondary | ICD-10-CM | POA: Diagnosis not present

## 2014-10-29 DIAGNOSIS — Y998 Other external cause status: Secondary | ICD-10-CM | POA: Diagnosis not present

## 2014-10-29 DIAGNOSIS — S71151A Open bite, right thigh, initial encounter: Secondary | ICD-10-CM | POA: Insufficient documentation

## 2014-10-29 DIAGNOSIS — Y9289 Other specified places as the place of occurrence of the external cause: Secondary | ICD-10-CM | POA: Insufficient documentation

## 2014-10-29 DIAGNOSIS — Z8546 Personal history of malignant neoplasm of prostate: Secondary | ICD-10-CM | POA: Diagnosis not present

## 2014-10-29 DIAGNOSIS — T148XXA Other injury of unspecified body region, initial encounter: Secondary | ICD-10-CM

## 2014-10-29 DIAGNOSIS — I10 Essential (primary) hypertension: Secondary | ICD-10-CM | POA: Insufficient documentation

## 2014-10-29 MED ORDER — AMOXICILLIN-POT CLAVULANATE 875-125 MG PO TABS: 1.0000 | ORAL_TABLET | Freq: Two times a day (BID) | ORAL | Status: DC

## 2014-10-29 NOTE — Discharge Instructions (Signed)

## 2014-10-29 NOTE — ED Notes (Addendum)
Pt states he was bitten in the right side of his groin area last night. [employer] picked up the dog but pt is unsure if the dog was vaccinated. [employer] called and is either sending a officer here or they will call here.

## 2014-10-29 NOTE — ED Provider Notes (Signed)
CSN: [id number]     Arrival date & time 10/29/14  8101 History   First MD Initiated Contact with Patient 10/29/14 [phone]     Chief Complaint  Patient presents with  . Animal Bite     Patient is a 64 y.o. male presenting with animal bite. The history is provided by the patient.  Animal Bite Contact animal:  Dog Location:  Leg Leg injury location:  R upper leg Time since incident:  10 hours Pain details:    Quality:  Aching   Severity:  Mild Animal's rabies vaccination status:  Unknown Tetanus status:  Up to date Relieved by:  Nothing Associated symptoms: no fever   pt was approached by two dogs last night and one of them bit his right thigh He has since cleansed the area after showering and placed peroxide on the wound No fever/vomiting/chills No other complaints at this time  Past Medical History  Diagnosis Date  . Hypertension   . Prostate ca   . GERD (gastroesophageal reflux disease)    History reviewed. No pertinent past surgical history. Family History  Problem Relation Age of Onset  . Hypertension Mother   . Hypertension Father   . Stroke Father    History  Substance Use Topics  . Smoking status: Never Smoker   . Smokeless tobacco: Not on file  . Alcohol Use: Yes    Review of Systems  Constitutional: Negative for fever and chills.  Gastrointestinal: Negative for vomiting.  Skin: Positive for wound.      Allergies  Review of patient's allergies indicates no known allergies.  Home Medications   Prior to Admission medications   Medication Sig Start Date End Date Taking? Authorizing Provider  lisinopril-hydrochlorothiazide (PRINZIDE,ZESTORETIC) 20-25 MG per tablet Take 1 tablet by mouth daily.   Yes Historical Provider, MD  Multiple Vitamin (MULTIVITAMIN WITH MINERALS) TABS tablet Take 1 tablet by mouth daily.   Yes Historical Provider, MD  omeprazole (PRILOSEC) 20 MG capsule Take 20 mg by mouth daily.   Yes Historical Provider, MD  oxymetazoline (AFRIN)  0.05 % nasal spray Place 2 sprays into the nose at bedtime as needed for congestion.   Yes Historical Provider, MD  traMADol (ULTRAM) 50 MG tablet Take by mouth 3 (three) times daily.   Yes Historical Provider, MD  amoxicillin-clavulanate (AUGMENTIN) 875-125 MG per tablet Take 1 tablet by mouth 2 (two) times daily. One po bid x 7 days 10/29/14   Ripley Fraise, MD   BP 121/75 mmHg  Pulse 89  Temp(Src) 98.2 F (36.8 C) (Oral)  Resp 18  Ht 6' (1.829 m)  Wt 180 lb (81.647 kg)  BMI 24.41 kg/m2  SpO2 97% Physical Exam CONSTITUTIONAL: Well developed/well nourished HEAD: Normocephalic/atraumatic EYES: EOMI/PERRL ENMT: Mucous membranes moist NECK: supple no meningeal signs CV: S1/S2 noted, no murmurs/rubs/gallops noted LUNGS: Lungs are clear to auscultation bilaterally, no apparent distress ABDOMEN: soft, nontender, no rebound or guarding, bowel sounds noted throughout abdomen NEURO: Pt is awake/alert/appropriate, moves all extremitiesx4.  No facial droop.   EXTREMITIES: pulses normal/equal, full ROM.  2 wounds noted to right proximal inner thigh.  No bleeding.  No erythema.  No induration noted.  No streaking.  No drainage noted.  Minimal tenderness SKIN: warm, color normal PSYCH: no abnormalities of mood noted, alert and oriented to situation  ED Course  Procedures   Wound appears clean Tetanus UTD Will start augmentin  Nursing spoke to police - one of two dogs captured, the other one is still  being searched for but owner reports dog is fully vaccinated.  At this point, I feel he is low risk for rabies exposure.  I did encourage patient to call police later today to determine if dog has been captured and if not he should return for re-evaluation   We discussed wound care and return precautions including increased pain, redness or fever >100.63F over next 24 hours   MDM   Final diagnoses:  Dog bite of right thigh, initial encounter  Animal bite    Nursing notes including past  medical history and social history reviewed and considered in documentation     Ripley Fraise, MD 10/29/14 [phone]

## 2015-06-17 ENCOUNTER — Emergency Department (HOSPITAL_COMMUNITY): Payer: Non-veteran care

## 2015-06-17 ENCOUNTER — Emergency Department (HOSPITAL_COMMUNITY)
Admission: EM | Admit: 2015-06-17 | Discharge: 2015-06-17 | Disposition: A | Discharge status: Home / Self Care | Payer: Non-veteran care | Attending: Emergency Medicine | Admitting: Emergency Medicine

## 2015-06-17 DIAGNOSIS — Z8546 Personal history of malignant neoplasm of prostate: Secondary | ICD-10-CM | POA: Diagnosis not present

## 2015-06-17 DIAGNOSIS — Z791 Long term (current) use of non-steroidal anti-inflammatories (NSAID): Secondary | ICD-10-CM | POA: Diagnosis not present

## 2015-06-17 DIAGNOSIS — M5412 Radiculopathy, cervical region: Secondary | ICD-10-CM | POA: Insufficient documentation

## 2015-06-17 DIAGNOSIS — Z79899 Other long term (current) drug therapy: Secondary | ICD-10-CM | POA: Insufficient documentation

## 2015-06-17 DIAGNOSIS — K219 Gastro-esophageal reflux disease without esophagitis: Secondary | ICD-10-CM | POA: Insufficient documentation

## 2015-06-17 DIAGNOSIS — M542 Cervicalgia: Secondary | ICD-10-CM | POA: Diagnosis present

## 2015-06-17 DIAGNOSIS — I10 Essential (primary) hypertension: Secondary | ICD-10-CM | POA: Insufficient documentation

## 2015-06-17 MED ORDER — PREDNISONE 20 MG PO TABS: ORAL_TABLET | ORAL | Status: DC

## 2015-06-17 MED ORDER — CYCLOBENZAPRINE HCL 10 MG PO TABS: 10.0000 mg | ORAL_TABLET | Freq: Three times a day (TID) | ORAL | Status: DC | PRN

## 2015-06-17 MED ORDER — HYDROCODONE-ACETAMINOPHEN 5-325 MG PO TABS: 1.0000 | ORAL_TABLET | ORAL | Status: DC | PRN

## 2015-06-17 NOTE — ED Provider Notes (Signed)
CSN: [id number]     Arrival date & time 06/17/15  0540 History   First MD Initiated Contact with Patient 06/17/15 0600     Chief Complaint  Patient presents with  . Neck Pain     (Consider location/radiation/quality/duration/timing/severity/associated sxs/prior Treatment) HPI Comments: Patient presents to the emergency for evaluation of neck pain. Patient reports that he has been having pain for approximately 1-1/2 months. He denies any direct injury. Patient reports that he has noticed that the pain is worsening. He feels popping when he moves his head. Pain is more on the right side than on the left. He has noticed recently that it is starting to radiate down his arm. He has not noticed any weakness in extremities, numbness or tingling.  Patient is a 65 y.o. male presenting with neck pain.  Neck Pain   Past Medical History  Diagnosis Date  . Hypertension   . Prostate ca   . GERD (gastroesophageal reflux disease)    No past surgical history on file. Family History  Problem Relation Age of Onset  . Hypertension Mother   . Hypertension Father   . Stroke Father    Social History  Substance Use Topics  . Smoking status: Never Smoker   . Smokeless tobacco: Not on file  . Alcohol Use: Yes    Review of Systems  Musculoskeletal: Positive for neck pain.  All other systems reviewed and are negative.     Allergies  Review of patient's allergies indicates no known allergies.  Home Medications   Prior to Admission medications   Medication Sig Start Date End Date Taking? Authorizing Provider  lisinopril-hydrochlorothiazide (PRINZIDE,ZESTORETIC) 20-25 MG per tablet Take 1 tablet by mouth daily.   Yes Historical Provider, MD  Multiple Vitamin (MULTIVITAMIN WITH MINERALS) TABS tablet Take 1 tablet by mouth daily.   Yes Historical Provider, MD  omeprazole (PRILOSEC) 20 MG capsule Take 20 mg by mouth daily.   Yes Historical Provider, MD  traMADol (ULTRAM) 50 MG tablet Take by mouth  3 (three) times daily.   Yes Historical Provider, MD  amoxicillin-clavulanate (AUGMENTIN) 875-125 MG per tablet Take 1 tablet by mouth 2 (two) times daily. One po bid x 7 days 10/29/14   Ripley Fraise, MD  cyclobenzaprine (FLEXERIL) 10 MG tablet Take 1 tablet (10 mg total) by mouth 3 (three) times daily as needed for muscle spasms. 06/17/15   Orpah Greek, MD  HYDROcodone-acetaminophen (NORCO/VICODIN) 5-325 MG tablet Take 1-2 tablets by mouth every 4 (four) hours as needed for moderate pain. 06/17/15   Orpah Greek, MD  oxymetazoline (AFRIN) 0.05 % nasal spray Place 2 sprays into the nose at bedtime as needed for congestion.    Historical Provider, MD  predniSONE (DELTASONE) 20 MG tablet 3 tabs po daily x 3 days, then 2 tabs x 3 days, then 1.5 tabs x 3 days, then 1 tab x 3 days, then 0.5 tabs x 3 days 06/17/15   Orpah Greek, MD   BP 138/94 mmHg  Pulse 107  Temp(Src) 98.8 F (37.1 C) (Oral)  Resp 16  SpO2 98% Physical Exam  Constitutional: He is oriented to person, place, and time. He appears well-developed and well-nourished. No distress.  HENT:  Head: Normocephalic and atraumatic.  Right Ear: Hearing normal.  Left Ear: Hearing normal.  Nose: Nose normal.  Mouth/Throat: Oropharynx is clear and moist and mucous membranes are normal.  Eyes: Conjunctivae and EOM are normal. Pupils are equal, round, and reactive to light.  Neck:  Neck supple. Muscular tenderness present. Decreased range of motion present.  Cardiovascular: Regular rhythm, S1 normal and S2 normal.  Exam reveals no gallop and no friction rub.   No murmur heard. Pulmonary/Chest: Effort normal and breath sounds normal. No respiratory distress. He exhibits no tenderness.  Abdominal: Soft. Normal appearance and bowel sounds are normal. There is no hepatosplenomegaly. There is no tenderness. There is no rebound, no guarding, no tenderness at McBurney's point and negative Murphy's sign. No hernia.  Neurological:  He is alert and oriented to person, place, and time. He has normal strength. No cranial nerve deficit or sensory deficit. Coordination normal. GCS eye subscore is 4. GCS verbal subscore is 5. GCS motor subscore is 6.  Skin: Skin is warm, dry and intact. No rash noted. No cyanosis.  Psychiatric: He has a normal mood and affect. His speech is normal and behavior is normal. Thought content normal.  Nursing note and vitals reviewed.   ED Course  Procedures (including critical care time) Labs Review Labs Reviewed - No data to display  Imaging Review Dg Cervical Spine Complete  06/17/2015  CLINICAL DATA:  Neck pain and popping for a couple months. Pain into the right shoulder. EXAM: CERVICAL SPINE - COMPLETE 4+ VIEW COMPARISON:  None. FINDINGS: There is no evidence of cervical spine fracture or prevertebral soft tissue swelling. Alignment is normal. Mild spondylotic change without focal or notable disc narrowing. Degenerative facet spurring at C2-3 at least. No right osseous foraminal stenosis to explain shoulder symptoms. Left foraminal evaluate is limited by obliquity. No evidence of focal bone lesion in this patient with history of prostate cancer. IMPRESSION: 1. No acute finding. 2. Mild spondylosis and facet arthropathy. Electronically Signed   By: Monte Fantasia M.D.   On: 06/17/2015 06:49   I have personally reviewed and evaluated these images and lab results as part of my medical decision-making.   EKG Interpretation None      MDM   Final diagnoses:  Cervical radiculopathy    Presents to the ER for evaluation of progressively worsening neck pain over a period of 1-1/2 months. Patient denies injury. He has normal grip strength, normal sensation, normal motor function in the upper extremities. X-ray shows degenerative changes, no acute findings. Patient treated empirically for cervical radiculopathy.    Orpah Greek, MD 06/17/15 [phone]

## 2015-06-17 NOTE — Discharge Instructions (Signed)

## 2015-06-17 NOTE — ED Notes (Signed)
Pt states his neck has been popping and painful for a couple of months and he just go a new job and feels he needs to find out why.  Pt denies new injury or trauma

## 2016-11-21 ENCOUNTER — Encounter (INDEPENDENT_AMBULATORY_CARE_PROVIDER_SITE_OTHER): Payer: Self-pay | Admitting: Internal Medicine

## 2016-12-02 ENCOUNTER — Ambulatory Visit (INDEPENDENT_AMBULATORY_CARE_PROVIDER_SITE_OTHER): Payer: Non-veteran care | Admitting: Internal Medicine

## 2016-12-12 ENCOUNTER — Ambulatory Visit (INDEPENDENT_AMBULATORY_CARE_PROVIDER_SITE_OTHER): Payer: Non-veteran care | Admitting: Internal Medicine

## 2016-12-18 ENCOUNTER — Ambulatory Visit (INDEPENDENT_AMBULATORY_CARE_PROVIDER_SITE_OTHER): Payer: Non-veteran care | Admitting: Internal Medicine

## 2016-12-18 ENCOUNTER — Encounter (INDEPENDENT_AMBULATORY_CARE_PROVIDER_SITE_OTHER): Payer: Self-pay | Admitting: Internal Medicine

## 2016-12-18 ENCOUNTER — Emergency Department (HOSPITAL_COMMUNITY): Payer: Non-veteran care

## 2016-12-18 ENCOUNTER — Encounter (HOSPITAL_COMMUNITY): Payer: Self-pay | Admitting: Emergency Medicine

## 2016-12-18 ENCOUNTER — Emergency Department (HOSPITAL_COMMUNITY)
Admission: EM | Admit: 2016-12-18 | Discharge: 2016-12-18 | Disposition: A | Discharge status: Home / Self Care | Payer: Non-veteran care | Attending: Emergency Medicine | Admitting: Emergency Medicine

## 2016-12-18 ENCOUNTER — Other Ambulatory Visit (INDEPENDENT_AMBULATORY_CARE_PROVIDER_SITE_OTHER): Payer: Self-pay | Admitting: Internal Medicine

## 2016-12-18 VITALS — BP 150/90 | HR 72 | Temp 98.1°F | Ht 72.0 in | Wt 166.2 lb

## 2016-12-18 DIAGNOSIS — K219 Gastro-esophageal reflux disease without esophagitis: Secondary | ICD-10-CM | POA: Diagnosis not present

## 2016-12-18 DIAGNOSIS — M87052 Idiopathic aseptic necrosis of left femur: Secondary | ICD-10-CM | POA: Insufficient documentation

## 2016-12-18 DIAGNOSIS — I10 Essential (primary) hypertension: Secondary | ICD-10-CM | POA: Insufficient documentation

## 2016-12-18 DIAGNOSIS — B1710 Acute hepatitis C without hepatic coma: Secondary | ICD-10-CM

## 2016-12-18 DIAGNOSIS — M25552 Pain in left hip: Secondary | ICD-10-CM | POA: Diagnosis present

## 2016-12-18 DIAGNOSIS — Z79899 Other long term (current) drug therapy: Secondary | ICD-10-CM | POA: Diagnosis not present

## 2016-12-18 MED ORDER — OXYCODONE-ACETAMINOPHEN 5-325 MG PO TABS: 1.0000 | ORAL_TABLET | Freq: Four times a day (QID) | ORAL | 0 refills | Status: DC | PRN

## 2016-12-18 MED ORDER — KETOROLAC TROMETHAMINE 30 MG/ML IJ SOLN
15.0000 mg | Freq: Once | INTRAMUSCULAR | Status: AC
  Administered 2016-12-18: 15 mg via INTRAMUSCULAR
  Filled 2016-12-18: qty 1

## 2016-12-18 MED ORDER — OXYCODONE-ACETAMINOPHEN 5-325 MG PO TABS
1.0000 | ORAL_TABLET | Freq: Once | ORAL | Status: AC
Start: 2016-12-18 — End: 2016-12-18
  Administered 2016-12-18: 1 via ORAL
  Filled 2016-12-18: qty 1

## 2016-12-18 NOTE — Discharge Instructions (Signed)
You were seen today for left hip pain. 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 This means you will likely need a hip replacement. You need to follow-up closely with your orthopedic surgeon. Contact your primary physician for refills of your pain medication. You will be given a short course of pain medication until you can contact your primary physician.

## 2016-12-18 NOTE — Progress Notes (Signed)
   Subjective:    Patient ID: Jonathan Benitez, male    DOB: [birth year], 66 y.o.   MRN: [id number]  HPI Referred by [facility] for GERD ([place]).  Patient states his acid reflux under control. Has been on Omeprazole for about 10 yrs. If he eats something spicy he will have GERD. If he doesn't eat spicy foods, he does not have GERD. Has been on Omeprazole for about 10 yrs.   His appetite is not good.  He says he has lost about 9 pounds. Denies any abdominal pain. He says he drinks Ensure for weight gain. He also c/o of left hip pain and says he cannot eat or sleep due to the pain. Has been taking Aleve and Tylenol for the pain but now has stopped. 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   His last colonoscopy was at the [facility] in [place] per patient 2 yrs ago and he reports it was normal.  He states he had an EGD about 5 yrs ago at the [facility]. Will get those records. Also states he has Hepatitis C.   Review of Systems Past Medical History:  Diagnosis Date  . GERD (gastroesophageal reflux disease)   . Hypertension   . Prostate CA ([place])     No past surgical history on file.  No Known Allergies  Current Outpatient Prescriptions on File Prior to Visit  Medication Sig Dispense Refill  . lisinopril-hydrochlorothiazide (PRINZIDE,ZESTORETIC) 20-25 MG per tablet Take 1 tablet by mouth daily.    . Multiple Vitamin (MULTIVITAMIN WITH MINERALS) TABS tablet Take 1 tablet by mouth daily.    Marland Kitchen omeprazole (PRILOSEC) 20 MG capsule Take 20 mg by mouth daily.    Marland Kitchen oxymetazoline (AFRIN) 0.05 % nasal spray Place 2 sprays into the nose at bedtime as needed for congestion.    . traMADol (ULTRAM) 50 MG tablet Take by mouth 3 (three) times daily.     No current facility-administered medications on file prior to visit.         Objective:   Physical Exam Blood pressure (!) 150/90, pulse 72, temperature 98.1 F (36.7 C), height 6' (1.829 m), weight 166 lb 3.2 oz  (75.4 kg). Alert and oriented. Skin warm and dry. Oral mucosa is moist.   . Sclera anicteric, conjunctivae is pink. Thyroid not enlarged. No cervical lymphadenopathy. Lungs clear. Heart regular rate and rhythm.  Abdomen is soft. Bowel sounds are positive. No hepatomegaly. No abdominal masses felt. No tenderness.  No edema to lower extremities. Patient is alert and oriented.        Assessment & Plan:  GERD controlled at this time. Has been on long term Omeprazole. I think he needs surveillance to be sure he doesn't have PUD.  Hepatitis C. Am going to get a  Hep C antibody, Hep C quaint, Hep C genotype

## 2016-12-18 NOTE — ED Triage Notes (Signed)
Pt c/o left hip pain since February and is being followed by [facility] for the same. Pt states he missed his last appt due to car trouble.

## 2016-12-18 NOTE — Patient Instructions (Addendum)
The risks of bleeding, perforation and infection were reviewed with patient. Labs for Hepatitis C today

## 2016-12-18 NOTE — ED Provider Notes (Signed)
[place] DEPT Provider Note   CSN: [id number] Arrival date & time: 12/18/16  0533     History   Chief Complaint Chief Complaint  Patient presents with  . Hip Pain    HPI Jonathan Benitez is a 66 y.o. male.  HPI  This is a 66 year old with a history of hypertension and reflux who presents with left hip pain. Patient reports onset of pain since February. Reports fairly atraumatic pain. It is worse with ambulation. He does walk with a cane. He follows at the [facility]. He was supposed to see an orthopedist next week for evaluation. 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 He states that the pain is 10 out of 10 and radiates down to his right knee. No back pain. He usually takes tramadol for pain but has to increase his dosage for effect and has run out. He denies any weakness, numbness, tingling of the lower extremities. He denies any fevers. He does report that he fell last week and thinks he may have hit his left hip exacerbating his pain.  Past Medical History:  Diagnosis Date  . GERD (gastroesophageal reflux disease)   . Hypertension   . Prostate CA [facility])     Patient Active Problem List   Diagnosis Date Noted  . Anemia 06/28/2012  . Prostate cancer ([place]) 06/28/2012  . HTN (hypertension) 06/28/2012  . Crohn's disease ([place]) 06/28/2012  . Alcoholism ([place]) 06/28/2012    History reviewed. No pertinent surgical history.     Home Medications    Prior to Admission medications   Medication Sig Start Date End Date Taking? Authorizing Provider  lisinopril-hydrochlorothiazide (PRINZIDE,ZESTORETIC) 20-25 MG per tablet Take 1 tablet by mouth daily.   Yes [provider]  Multiple Vitamin (MULTIVITAMIN WITH MINERALS) TABS tablet Take 1 tablet by mouth daily.   Yes [provider]  omeprazole (PRILOSEC) 20 MG capsule Take 20 mg by mouth daily.   Yes [provider]  traMADol (ULTRAM) 50 MG tablet Take by mouth 3 (three) times daily.   Yes  [provider]  amoxicillin-clavulanate (AUGMENTIN) 875-125 MG per tablet Take 1 tablet by mouth 2 (two) times daily. One po bid x 7 days 10/29/14   Ripley Fraise, MD  cyclobenzaprine (FLEXERIL) 10 MG tablet Take 1 tablet (10 mg total) by mouth 3 (three) times daily as needed for muscle spasms. 06/17/15   Orpah Greek, MD  HYDROcodone-acetaminophen (NORCO/VICODIN) 5-325 MG tablet Take 1-2 tablets by mouth every 4 (four) hours as needed for moderate pain. 06/17/15   Orpah Greek, MD  oxyCODONE-acetaminophen (PERCOCET/ROXICET) 5-325 MG tablet Take 1 tablet by mouth every 6 (six) hours as needed for severe pain. 12/18/16   Gaddiel Cullens, Barbette Hair, MD  oxymetazoline (AFRIN) 0.05 % nasal spray Place 2 sprays into the nose at bedtime as needed for congestion.    [provider]  predniSONE (DELTASONE) 20 MG tablet 3 tabs po daily x 3 days, then 2 tabs x 3 days, then 1.5 tabs x 3 days, then 1 tab x 3 days, then 0.5 tabs x 3 days 06/17/15   Orpah Greek, MD    Family History Family History  Problem Relation Age of Onset  . Hypertension Mother   . Hypertension Father   . Stroke Father     Social History Social History  Substance Use Topics  . Smoking status: Never Smoker  . Smokeless tobacco: Never Used  . Alcohol use Yes     Allergies  Patient has no known allergies.   Review of Systems Review of Systems  Constitutional: Negative for fever.  Musculoskeletal: Positive for joint swelling.       Hip pain  Neurological: Negative for weakness and numbness.  All other systems reviewed and are negative.    Physical Exam Updated Vital Signs BP (!) 155/113   Pulse (!) 107   Temp 98 F (36.7 C)   Resp 17   Ht 6' (1.829 m)   Wt 77.1 kg (170 lb)   SpO2 98%   BMI 23.06 kg/m   Physical Exam  Constitutional: He is oriented to person, place, and time. He appears well-developed and well-nourished.  No acute distress  HENT:  Head: Normocephalic  and atraumatic.  Cardiovascular: Normal rate, regular rhythm and normal heart sounds.   No murmur heard. Pulmonary/Chest: Effort normal and breath sounds normal. No respiratory distress. He has no wheezes.  Musculoskeletal: He exhibits no edema.  5 out of 5 strength bilateral lower extremities, decreased range of motion left hip secondary to pain  Neurological: He is alert and oriented to person, place, and time.  No clonus noted  Skin: Skin is warm and dry.  Psychiatric: He has a normal mood and affect.  Nursing note and vitals reviewed.    ED Treatments / Results  Labs (all labs ordered are listed, but only abnormal results are displayed) Labs Reviewed - No data to display  EKG  EKG Interpretation None       Radiology Dg Hip Unilat W Or Wo Pelvis 2-3 Views Left  Result Date: 12/18/2016 CLINICAL DATA:  Worsening chronic left hip pain. Recent fall. Initial encounter. EXAM: DG HIP (WITH OR WITHOUT PELVIS) 2-3V LEFT COMPARISON:  None. FINDINGS: There is no evidence of acute fracture or dislocation. Changes of avascular necrosis are noted at the left femoral head, with cortical collapse and joint space narrowing. The right hip joint is unremarkable in appearance. A mesh is noted overlying the left hemipelvis. Scattered vascular calcifications are seen. Degenerative change is noted at the lower lumbar spine. The sacroiliac joints are grossly unremarkable. IMPRESSION: 1. No evidence of acute fracture or dislocation. 2. Changes of avascular necrosis at the left femoral head, with cortical collapse and joint space narrowing. 3. Scattered vascular calcifications seen. Electronically Signed   By: Garald Balding M.D.   On: 12/18/2016 06:37    Procedures Procedures (including critical care time)  Medications Ordered in ED Medications  ketorolac (TORADOL) 30 MG/ML injection 15 mg (15 mg Intramuscular Given 12/18/16 0613)  oxyCODONE-acetaminophen (PERCOCET/ROXICET) 5-325 MG per tablet 1 tablet  (1 tablet Oral Given 12/18/16 1062)     Initial Impression / Assessment and Plan / ED Course  I have reviewed the triage vital signs and the nursing notes.  Pertinent labs & imaging results that were available during my care of the patient were reviewed by me and considered in my medical decision making (see chart for details).     A short presents with acute on chronic left hip pain. Does report recent fall which has exacerbated his pain. He has decreased range of motion. He appears neurologically intact. X-rays show no evidence of fracture. He does have evidence of avascular necrosis. I have no prior plain films to compare to. Patient was given pain medication in the ED. He has run out of his tramadol. I have reviewed the [facility], it appears patient gets monthly tramadol prescriptions from the same provider. Given that he does have fairly severe  avascular necrosis, I will discharge him with a very short course of Percocet. He needs to follow-up with his primary physician for additional pain medications. I also stressed with the patient that he needs to follow-up with orthopedics. He likely needs a hip replacement.  After history, exam, and medical workup I feel the patient has been appropriately medically screened and is safe for discharge home. Pertinent diagnoses were discussed with the patient. Patient was given return precautions.   Final Clinical Impressions(s) / ED Diagnoses   Final diagnoses:  Left hip pain  Avascular necrosis of bone of left hip (HCC)    New Prescriptions New Prescriptions   OXYCODONE-ACETAMINOPHEN (PERCOCET/ROXICET) 5-325 MG TABLET    Take 1 tablet by mouth every 6 (six) hours as needed for severe pain.     Merryl Hacker, MD 12/18/16 [phone]

## 2016-12-19 ENCOUNTER — Encounter (INDEPENDENT_AMBULATORY_CARE_PROVIDER_SITE_OTHER): Payer: Self-pay | Admitting: *Deleted

## 2016-12-19 DIAGNOSIS — K219 Gastro-esophageal reflux disease without esophagitis: Secondary | ICD-10-CM | POA: Insufficient documentation

## 2016-12-19 LAB — HEPATITIS C ANTIBODY: HCV AB: REACTIVE — AB

## 2016-12-21 LAB — HEPATITIS C RNA QUANTITATIVE
HCV Quantitative Log: 6.47 Log IU/mL — ABNORMAL HIGH
HCV Quantitative: 2920000 IU/mL — ABNORMAL HIGH

## 2016-12-23 LAB — HEPATITIS C GENOTYPE

## 2017-01-30 ENCOUNTER — Encounter (INDEPENDENT_AMBULATORY_CARE_PROVIDER_SITE_OTHER): Payer: Self-pay | Admitting: Internal Medicine

## 2017-01-30 NOTE — Progress Notes (Signed)
Patient was given an appointment for 05/07/17 at 1:45pm with Deberah Castle, NP.  A letter was mailed to the patient.

## 2017-04-30 ENCOUNTER — Encounter (HOSPITAL_COMMUNITY): Admission: RE | Payer: Self-pay | Source: Ambulatory Visit

## 2017-04-30 ENCOUNTER — Ambulatory Visit (HOSPITAL_COMMUNITY): Admission: RE | Admit: 2017-04-30 | Payer: Non-veteran care | Source: Ambulatory Visit | Admitting: Internal Medicine

## 2017-04-30 SURGERY — EGD (ESOPHAGOGASTRODUODENOSCOPY)
Anesthesia: Moderate Sedation

## 2017-05-07 ENCOUNTER — Ambulatory Visit (INDEPENDENT_AMBULATORY_CARE_PROVIDER_SITE_OTHER): Payer: Non-veteran care | Admitting: Internal Medicine

## 2017-07-24 ENCOUNTER — Encounter (HOSPITAL_COMMUNITY): Payer: Self-pay | Admitting: Emergency Medicine

## 2017-07-24 ENCOUNTER — Other Ambulatory Visit: Payer: Self-pay

## 2017-07-24 ENCOUNTER — Emergency Department (HOSPITAL_COMMUNITY)
Admission: EM | Admit: 2017-07-24 | Discharge: 2017-07-24 | Disposition: A | Discharge status: Home / Self Care | Payer: Non-veteran care | Attending: Emergency Medicine | Admitting: Emergency Medicine

## 2017-07-24 DIAGNOSIS — I10 Essential (primary) hypertension: Secondary | ICD-10-CM | POA: Diagnosis not present

## 2017-07-24 DIAGNOSIS — Z8546 Personal history of malignant neoplasm of prostate: Secondary | ICD-10-CM | POA: Diagnosis not present

## 2017-07-24 DIAGNOSIS — F1210 Cannabis abuse, uncomplicated: Secondary | ICD-10-CM | POA: Insufficient documentation

## 2017-07-24 DIAGNOSIS — Z79899 Other long term (current) drug therapy: Secondary | ICD-10-CM | POA: Diagnosis not present

## 2017-07-24 DIAGNOSIS — M25552 Pain in left hip: Secondary | ICD-10-CM | POA: Diagnosis not present

## 2017-07-24 MED ORDER — HYDROMORPHONE HCL 1 MG/ML IJ SOLN
1.0000 mg | Freq: Once | INTRAMUSCULAR | Status: AC
  Administered 2017-07-24: 1 mg via INTRAMUSCULAR
  Filled 2017-07-24: qty 1

## 2017-07-24 MED ORDER — HYDROMORPHONE HCL 4 MG PO TABS: 4.0000 mg | ORAL_TABLET | Freq: Four times a day (QID) | ORAL | 0 refills | Status: DC | PRN

## 2017-07-24 NOTE — ED Provider Notes (Signed)
[facility] EMERGENCY DEPARTMENT Provider Note   CSN: [id number] Arrival date & time: 07/24/17  0710     History   Chief Complaint Chief Complaint  Patient presents with  . Hip Pain    HPI Jonathan Benitez is a 67 y.o. male.  Patient complains of left hip pain.  He has avascular necrosis of the left hip and is arranging for surgery to his hip at the [facility].  He has an appointment next week   The history is provided by the patient. No language interpreter was used.  Hip Pain  This is a new problem. The current episode started more than 2 days ago. The problem occurs constantly. The problem has not changed since onset.Pertinent negatives include no chest pain, no abdominal pain and no headaches. Exacerbated by: Movement. Nothing relieves the symptoms. He has tried nothing for the symptoms.    Past Medical History:  Diagnosis Date  . GERD (gastroesophageal reflux disease)   . Hypertension   . Prostate CA [facility])     Patient Active Problem List   Diagnosis Date Noted  . Gastroesophageal reflux disease without esophagitis 12/19/2016  . Anemia 06/28/2012  . Prostate cancer ([place]) 06/28/2012  . HTN (hypertension) 06/28/2012  . Crohn's disease ([place]) 06/28/2012  . Alcoholism ([place]) 06/28/2012    History reviewed. No pertinent surgical history.     Home Medications    Prior to Admission medications   Medication Sig Start Date End Date Taking? Authorizing Provider  ferrous sulfate 325 (65 FE) MG tablet Take by mouth daily with breakfast.    [provider]  HYDROmorphone (DILAUDID) 4 MG tablet Take 1 tablet (4 mg total) by mouth every 6 (six) hours as needed for severe pain. 07/24/17   Milton Ferguson, MD  lisinopril-hydrochlorothiazide (PRINZIDE,ZESTORETIC) 20-25 MG per tablet Take 1 tablet by mouth daily.    [provider]  Multiple Vitamin (MULTIVITAMIN WITH MINERALS) TABS tablet Take 1 tablet by mouth daily.    [provider]  omeprazole  (PRILOSEC) 20 MG capsule Take 20 mg by mouth 2 (two) times daily before a meal.     [provider]  oxyCODONE-acetaminophen (PERCOCET/ROXICET) 5-325 MG tablet Take 1 tablet by mouth every 6 (six) hours as needed for severe pain. 12/18/16   Horton, Barbette Hair, MD  oxymetazoline (AFRIN) 0.05 % nasal spray Place 2 sprays into the nose at bedtime as needed for congestion.    [provider]  traMADol (ULTRAM) 50 MG tablet Take by mouth 3 (three) times daily.    [provider]    Family History Family History  Problem Relation Age of Onset  . Hypertension Mother   . Hypertension Father   . Stroke Father     Social History Social History   Tobacco Use  . Smoking status: Never Smoker  . Smokeless tobacco: Never Used  Substance Use Topics  . Alcohol use: Yes    Comment: 2 beers a day  . Drug use: Yes    Types: Marijuana     Allergies   Patient has no known allergies.   Review of Systems Review of Systems  Constitutional: Negative for appetite change and fatigue.  HENT: Negative for congestion, ear discharge and sinus pressure.   Eyes: Negative for discharge.  Respiratory: Negative for cough.   Cardiovascular: Negative for chest pain.  Gastrointestinal: Negative for abdominal pain and diarrhea.  Genitourinary: Negative for frequency and hematuria.  Musculoskeletal: Negative for back pain.  Left hip pain  Skin: Negative for rash.  Neurological: Negative for seizures and headaches.  Psychiatric/Behavioral: Negative for hallucinations.     Physical Exam Updated Vital Signs BP 126/81 (BP Location: Right Arm)   Pulse (!) 110   Temp 97.7 F (36.5 C) (Oral)   Resp 18   Ht 6' (1.829 m)   Wt 77.1 kg (170 lb)   SpO2 99%   BMI 23.06 kg/m   Physical Exam  Constitutional: He is oriented to person, place, and time. He appears well-developed.  HENT:  Head: Normocephalic.  Eyes: Conjunctivae and EOM are normal. No scleral icterus.  Neck: Neck  supple. No thyromegaly present.  Cardiovascular: Normal rate and regular rhythm. Exam reveals no gallop and no friction rub.  No murmur heard. Pulmonary/Chest: No stridor. He has no wheezes. He has no rales. He exhibits no tenderness.  Abdominal: He exhibits no distension. There is no tenderness. There is no rebound.  Musculoskeletal: Normal range of motion. He exhibits no edema.  Tender left hip with decreased range of motion  Lymphadenopathy:    He has no cervical adenopathy.  Neurological: He is oriented to person, place, and time. He exhibits normal muscle tone. Coordination normal.  Skin: No rash noted. No erythema.  Psychiatric: He has a normal mood and affect. His behavior is normal.     ED Treatments / Results  Labs (all labs ordered are listed, but only abnormal results are displayed) Labs Reviewed - No data to display  EKG  EKG Interpretation None       Radiology No results found.  Procedures Procedures (including critical care time)  Medications Ordered in ED Medications  HYDROmorphone (DILAUDID) injection 1 mg (not administered)     Initial Impression / Assessment and Plan / ED Course  I have reviewed the triage vital signs and the nursing notes.  Pertinent labs & imaging results that were available during my care of the patient were reviewed by me and considered in my medical decision making (see chart for details).     Patient with avascular necrosis of the left hip.  He states that the Ultram is not helping him.  He has also had Percocet without help.  He is prescribed some Dilaudid for discomfort.  He cannot take nonsteroidals because of peptic ulcer problems.  He will follow-up with the [facility] next week  Final Clinical Impressions(s) / ED Diagnoses   Final diagnoses:  Left hip pain    ED Discharge Orders        Ordered    HYDROmorphone (DILAUDID) 4 MG tablet  Every 6 hours PRN     07/24/17 0805       Milton Ferguson, MD 07/24/17 [phone]

## 2017-07-24 NOTE — ED Triage Notes (Signed)
Pt c/o of chronic hip pain since last February. [facility] but [facility] off.

## 2017-07-24 NOTE — Discharge Instructions (Signed)
[facility].  Try to stay off it as much as possible.  You can take Tylenol for additional help with the discomfort.

## 2017-11-11 ENCOUNTER — Inpatient Hospital Stay
Admission: RE | Admit: 2017-11-11 | Discharge: 2017-11-25 | Disposition: A | Discharge status: Home / Self Care | Payer: Medicare Other | Source: Ambulatory Visit | Attending: Internal Medicine | Admitting: Internal Medicine

## 2017-11-12 ENCOUNTER — Other Ambulatory Visit (HOSPITAL_COMMUNITY)
Admission: RE | Admit: 2017-11-12 | Discharge: 2017-11-12 | Disposition: A | Discharge status: Home / Self Care | Payer: Non-veteran care | Source: Skilled Nursing Facility | Attending: Internal Medicine | Admitting: Internal Medicine

## 2017-11-12 ENCOUNTER — Non-Acute Institutional Stay (SKILLED_NURSING_FACILITY): Payer: Self-pay | Admitting: Internal Medicine

## 2017-11-12 ENCOUNTER — Other Ambulatory Visit: Payer: Self-pay

## 2017-11-12 ENCOUNTER — Encounter: Payer: Self-pay | Admitting: Internal Medicine

## 2017-11-12 DIAGNOSIS — R7989 Other specified abnormal findings of blood chemistry: Secondary | ICD-10-CM | POA: Insufficient documentation

## 2017-11-12 DIAGNOSIS — D649 Anemia, unspecified: Secondary | ICD-10-CM

## 2017-11-12 DIAGNOSIS — M87052 Idiopathic aseptic necrosis of left femur: Secondary | ICD-10-CM

## 2017-11-12 DIAGNOSIS — E8809 Other disorders of plasma-protein metabolism, not elsewhere classified: Secondary | ICD-10-CM

## 2017-11-12 LAB — COMPREHENSIVE METABOLIC PANEL
ALBUMIN: 3.2 g/dL — AB (ref 3.5–5.0)
ALT: 34 U/L (ref 0–44)
ANION GAP: 12 (ref 5–15)
AST: 44 U/L — ABNORMAL HIGH (ref 15–41)
Alkaline Phosphatase: 66 U/L (ref 38–126)
BILIRUBIN TOTAL: 1.1 mg/dL (ref 0.3–1.2)
BUN: 9 mg/dL (ref 8–23)
CO2: 25 mmol/L (ref 22–32)
Calcium: 9.6 mg/dL (ref 8.9–10.3)
Chloride: 102 mmol/L (ref 98–111)
Creatinine, Ser: 0.68 mg/dL (ref 0.61–1.24)
GFR calc non Af Amer: >60 mL/min (ref >60)
GLUCOSE: 97 mg/dL (ref 70–99)
POTASSIUM: 3.7 mmol/L (ref 3.5–5.1)
SODIUM: 139 mmol/L (ref 135–145)
TOTAL PROTEIN: 8.4 g/dL — AB (ref 6.5–8.1)

## 2017-11-12 LAB — CBC WITH DIFFERENTIAL/PLATELET
BASOS PCT: 0 %
Basophils Absolute: 0 10*3/uL (ref 0.0–0.1)
EOS ABS: 0.1 10*3/uL (ref 0.0–0.7)
Eosinophils Relative: 1 %
HEMATOCRIT: 31 % — AB (ref 39.0–52.0)
Hemoglobin: 10.1 g/dL — ABNORMAL LOW (ref 13.0–17.0)
Lymphocytes Relative: 13 %
Lymphs Abs: 1 10*3/uL (ref 0.7–4.0)
MCH: 31.4 pg (ref 26.0–34.0)
MCHC: 32.6 g/dL (ref 30.0–36.0)
MCV: 96.3 fL (ref 78.0–100.0)
MONO ABS: 1.2 10*3/uL — AB (ref 0.1–1.0)
Monocytes Relative: 15 %
Neutro Abs: 5.6 10*3/uL (ref 1.7–7.7)
Neutrophils Relative %: 71 %
Platelets: 305 10*3/uL (ref 150–400)
RBC: 3.22 MIL/uL — ABNORMAL LOW (ref 4.22–5.81)
RDW: 13.6 % (ref 11.5–15.5)
WBC: 7.9 10*3/uL (ref 4.0–10.5)

## 2017-11-12 MED ORDER — OXYCODONE HCL 5 MG PO TABS: 5.0000 mg | ORAL_TABLET | ORAL | 0 refills | Status: DC | PRN

## 2017-11-12 NOTE — Telephone Encounter (Signed)
RX Fax for [facility]@ [phone]  

## 2017-11-12 NOTE — Patient Instructions (Signed)
See assessment and plan under each diagnosis in the problem list and acutely for this visit 

## 2017-11-12 NOTE — Assessment & Plan Note (Signed)
Orthopedic follow-up 11/21/17 11/12/17 Lab results will be provided for him to take to the follow-up visit

## 2017-11-12 NOTE — Assessment & Plan Note (Addendum)
11/12/17 hemoglobin 10.1/hematocrit 31, normochromic normocytic indices. No active bleeding dyscrasias. Patient denies history of sickle cell trait

## 2017-11-12 NOTE — Progress Notes (Signed)
Provider:  Unice Cobble Location:   [place] Room Number: 136/P Place of Service:  SNF (31)  PCP: Dr [place] Bl[facility] Extended Emergency Contact Information Primary Emergency Contact: Senaida Ores,  [address] [address] Phone: [phone] Relation: Sister  Code Status: Full Code Goals of Care: Advanced Directive information Advanced Directives 11/12/2017  Does Patient Have a Medical Advance Directive? Yes  Type of Advance Directive (No Data)  Does patient want to make changes to medical advance directive? No - Patient declined  Would patient like information on creating a medical advance directive? -  Pre-existing out of facility DNR order (yellow form or pink MOST form) -   Chief Complaint  Patient presents with  . New Admit To SNF    New Admission Visit   This is a comprehensive admission note to [facility] performed on this date less than 30 days from date of admission. Included are preadmission medical/surgical history; reconciled medication list; family history; social history and comprehensive review of systems.  Corrections and additions to the records were documented. Comprehensive physical exam was also performed. Additionally a clinical summary was entered for each active diagnosis pertinent to this admission in the Problem List to enhance continuity of care.  HPI: Patient was hospitalized at the [facility] 6/28-6 11/11/17. He underwent L hip replacement 6/28 by Dr. Florian Buff. Other than pain & some wound drainage at the op site pain; he had no perioperative complications. Unfortunately after arriving at the SNF last night he had no pain medication orders. He did receive Percocet 5/325 two pills this morning Because the pain he wants to defer PT/OT today. 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 He also has a history of prostate cancer.  Past medical and surgical  history updated to include [facility] records.  Social history includes service in [place] in the [employer]. He does smoke marijuana but has not smoked cigarettes. He states he only drinks 2 beers a day.  Family history reviewed   Review of systems:  His only complaint is the hip pain which kept him awake all night. He does have a history of chronic anemia. No active bleeding dyscrasias or GI symptoms. He did have dark stools while he was on iron. He takes an allergy pill for extrinsic symptoms. He also has pruritic dermatitis. A skin biopsy from the left mandibular area performed by dermatology at the [facility] is pending.  Constitutional: No fever, significant weight change, fatigue  Eyes: No redness, discharge, pain, vision change ENT/mouth: No nasal congestion, purulent discharge, earache, change in hearing, sore throat  Cardiovascular: No chest pain, palpitations, paroxysmal nocturnal dyspnea, claudication, edema  Respiratory: No cough, sputum production, hemoptysis, DOE, significant snoring, apnea Gastrointestinal: No heartburn, dysphagia, abdominal pain, nausea /vomiting, rectal bleeding, melena, change in bowels Genitourinary: No dysuria, hematuria, pyuria, incontinence, nocturia Neurologic: No dizziness, headache, syncope, seizures, numbness, tingling Psychiatric: No significant anxiety, depression, insomnia, anorexia Endocrine: No change in hair/nails, excessive thirst, excessive hunger, excessive urination  Hematologic/lymphatic: No significant bruising, lymphadenopathy, abnormal bleeding Allergy/immunology: No active itchy/watery eyes, significant sneezing,angioedema  Physical exam:  Pertinent or positive findings: Has a mustache and beard which is close cropped. Head is shaven. He has bilateral ptosis. Arcus senilis is noted. He has a slight tachycardia without dysrhythmia. Some serous drainage is noted over the dressing of left hip. He has isolated excoriations of the trunk  and  extremities.  General appearance: Adequately nourished; no acute distress, increased work of breathing is present.   Lymphatic: No lymphadenopathy about the head, neck, axilla. Eyes: No conjunctival inflammation or lid edema is present. There is no scleral icterus. Ears:  External ear exam shows no significant lesions or deformities.   Nose:  External nasal examination shows no deformity or inflammation. Nasal mucosa are pink and moist without lesions, exudates Oral exam: Lips and gums are healthy appearing.There is no oropharyngeal erythema or exudate. Neck:  No thyromegaly, masses, tenderness noted.    Heart:  No murmur, click, rub.  Lungs: Chest clear to auscultation without wheezes, rhonchi, rales, rubs. Abdomen: Bowel sounds are normal.  Abdomen is soft and nontender with no organomegaly, hernias, masses. GU: Deferred  Extremities:  No cyanosis, clubbing, edema. Neurologic exam: Balance, Rhomberg, finger to nose testing could not be completed due to clinical state Skin: Warm & dry w/o tenting. No significant rash.  See clinical summary under each active problem in the Problem List with associated updated therapeutic plan   Past Medical History:  Diagnosis Date  . GERD (gastroesophageal reflux disease)   . Hypertension   . Prostate CA [facility])    History reviewed. No pertinent surgical history.  reports that he has never smoked. He has never used smokeless tobacco. He reports that he drinks alcohol. He reports that he has current or past drug history. Drug: Marijuana. Social History   Socioeconomic History  . Marital status: Single    Spouse name: Not on file  . Number of children: Not on file  . Years of education: Not on file  . Highest education level: Not on file  Occupational History  . Not on file  Social Needs  . Financial resource strain: Not on file  . Food insecurity:    Worry: Not on file    Inability: Not on file  . Transportation needs:    Medical: Not on  file    Non-medical: Not on file  Tobacco Use  . Smoking status: Never Smoker  . Smokeless tobacco: Never Used  Substance and Sexual Activity  . Alcohol use: Yes    Comment: 2 beers a day  . Drug use: Yes    Types: Marijuana  . Sexual activity: Not on file  Lifestyle  . Physical activity:    Days per week: Not on file    Minutes per session: Not on file  . Stress: Not on file  Relationships  . Social connections:    Talks on phone: Not on file    Gets together: Not on file    Attends religious service: Not on file    Active member of club or organization: Not on file    Attends meetings of clubs or organizations: Not on file    Relationship status: Not on file  . Intimate partner violence:    Fear of current or ex partner: Not on file    Emotionally abused: Not on file    Physically abused: Not on file    Forced sexual activity: Not on file  Other Topics Concern  . Not on file  Social History Narrative  . Not on file    Functional Status Survey:    Family History  Problem Relation Age of Onset  . Hypertension Mother   . Hypertension Father   . Stroke Father     Health Maintenance  Topic Date Due  . COLONOSCOPY  12/13/2017 (Originally 01/28/2000)  . TETANUS/TDAP  12/13/2017 (Originally 01/27/1969)  . PNA vac Low Risk Adult (1 of 2 - PCV13) 12/13/2017 (Originally 01/28/2015)  . INFLUENZA VACCINE  12/11/2017  . Hepatitis C Screening  Completed    No Known Allergies  Outpatient Encounter Medications as of 11/12/2017  Medication Sig  . acetaminophen (TYLENOL) 325 MG tablet Take 975 mg by mouth every 8 (eight) hours as needed.  Marland Kitchen aspirin 325 MG EC tablet Take 325 mg by mouth 2 (two) times daily.  . cetirizine (ZYRTEC) 10 MG tablet Take 10 mg by mouth 2 (two) times daily.  . fluticasone (FLONASE) 50 MCG/ACT nasal spray Place 2 sprays into both nostrils daily.  Marland Kitchen gabapentin (NEURONTIN) 300 MG capsule Take 300 mg by mouth every 8 (eight) hours.  Marland Kitchen  lisinopril-hydrochlorothiazide (PRINZIDE,ZESTORETIC) 20-25 MG per tablet Take 1 tablet by mouth daily.  . montelukast (SINGULAIR) 10 MG tablet Take 10 mg by mouth at bedtime.  . Multiple Vitamin (MULTIVITAMIN WITH MINERALS) TABS tablet Take 1 tablet by mouth daily.  Marland Kitchen omeprazole (PRILOSEC) 20 MG capsule Take 20 mg by mouth 2 (two) times daily before a meal.   . ondansetron (ZOFRAN) 4 MG tablet Take 4 mg by mouth every 8 (eight) hours as needed for nausea or vomiting.  Marland Kitchen oxyCODONE (OXY IR/ROXICODONE) 5 MG immediate release tablet Take 5 mg by mouth every 3 (three) hours as needed for severe pain.  Marland Kitchen oxyCODONE (OXY IR/ROXICODONE) 5 MG immediate release tablet Take 10 mg by mouth every 3 (three) hours as needed for severe pain.  Marland Kitchen oxyCODONE (OXY IR/ROXICODONE) 5 MG immediate release tablet Take 15 mg by mouth every 3 (three) hours as needed for severe pain.  Marland Kitchen senna (SENOKOT) 8.6 MG tablet Take 1 tablet by mouth daily.  . tamsulosin (FLOMAX) 0.4 MG CAPS capsule Take 0.4 mg by mouth at bedtime.  . traZODone (DESYREL) 50 MG tablet Take 50 mg by mouth at bedtime.  . [DISCONTINUED] ferrous sulfate 325 (65 FE) MG tablet Take by mouth daily with breakfast.  . [DISCONTINUED] HYDROmorphone (DILAUDID) 4 MG tablet Take 1 tablet (4 mg total) by mouth every 6 (six) hours as needed for severe pain.  . [DISCONTINUED] oxyCODONE-acetaminophen (PERCOCET/ROXICET) 5-325 MG tablet Take 1 tablet by mouth every 6 (six) hours as needed for severe pain.  . [DISCONTINUED] oxymetazoline (AFRIN) 0.05 % nasal spray Place 2 sprays into the nose at bedtime as needed for congestion.  . [DISCONTINUED] traMADol (ULTRAM) 50 MG tablet Take by mouth 3 (three) times daily.   No facility-administered encounter medications on file as of 11/12/2017.      Review of Systems  Vitals:   11/12/17 0951  BP: 136/79  Pulse: 84  Resp: 19  Temp: 97.9 F (36.6 C)  TempSrc: Oral   There is no height or weight on file to calculate  BMI. Physical Exam  Labs reviewed: Basic Metabolic Panel: Recent Labs    11/12/17 0837  NA 139  K 3.7  CL 102  CO2 25  GLUCOSE 97  BUN 9  CREATININE 0.68  CALCIUM 9.6   Liver Function Tests: Recent Labs    11/12/17 0837  AST 44*  ALT 34  ALKPHOS 66  BILITOT 1.1  PROT 8.4*  ALBUMIN 3.2*   No results for input(s): LIPASE, AMYLASE in the last 8760 hours. No results for input(s): AMMONIA in the last 8760 hours. CBC: Recent Labs    11/12/17 0837  WBC 7.9  NEUTROABS 5.6  HGB 10.1*  HCT 31.0*  MCV 96.3  PLT 305   Cardiac Enzymes: No results for input(s): CKTOTAL, CKMB, CKMBINDEX, TROPONINI in the last 8760 hours. BNP: Invalid input(s): POCBNP No results found for: HGBA1C No results found for: TSH Lab Results  Component Value Date   VITAMINB12 726 06/28/2012   Lab Results  Component Value Date   FOLATE 8.9 06/28/2012   Lab Results  Component Value Date   IRON 11 (L) 06/28/2012   TIBC NOT CALC 06/28/2012   FERRITIN 6 (L) 06/28/2012

## 2017-11-12 NOTE — Assessment & Plan Note (Signed)
Nutrition consult

## 2017-11-14 ENCOUNTER — Non-Acute Institutional Stay (SKILLED_NURSING_FACILITY): Payer: Self-pay | Admitting: Internal Medicine

## 2017-11-14 ENCOUNTER — Encounter: Payer: Self-pay | Admitting: Internal Medicine

## 2017-11-14 DIAGNOSIS — M87052 Idiopathic aseptic necrosis of left femur: Secondary | ICD-10-CM

## 2017-11-14 DIAGNOSIS — D649 Anemia, unspecified: Secondary | ICD-10-CM

## 2017-11-14 DIAGNOSIS — I10 Essential (primary) hypertension: Secondary | ICD-10-CM

## 2017-11-14 NOTE — Progress Notes (Signed)
Location:   [place] Room Number: 136/P Place of Service:  SNF (31) Provider:  [place]  System, Provider Not In  Patient Care Team: System, Provider Not In as PCP - General  Extended Emergency Contact Information Primary Emergency Contact: Senaida Ores, [place] [address] [place] Phone: [phone] Relation: Sister  Code Status:  Full Code Goals of care: Advanced Directive information Advanced Directives 11/14/2017  Does Patient Have a Medical Advance Directive? Yes  Type of Advance Directive (No Data)  Does patient want to make changes to medical advance directive? No - Patient declined  Would patient like information on creating a medical advance directive? -  Pre-existing out of facility DNR order (yellow form or pink MOST form) -     Chief Complaint  Patient presents with  . Acute Visit    Patient being seen for Hip Pain Issues    HPI:  Pt is a 67 y.o. male seen today for an acute visit for  Follow-up of left hip pain.  Patient was hospitalized at the [facility] from June 28 2 July 2 he underwent a left hip replacement--in the context of a history of avascular necrosis.  He is currently on oxycodone 10 mg every 4 hours as needed he would like this changed to routine dosing--he says he has significant pain and  routine dosing would be more effective since sometimes he does not get his pain medication every 4 hours promptly when he asks for it--  He actually has called his orthopedic service at the [place] as well.  --I had the opportunity to speak with the service-and appreciate their input--they recommend continuing the as needed dose  I did discuss this with Siah and he is agreeable to this but says he really does need it every 4 hours promptly when he asks for it--he does not complain of any respiratory depression or oversedation-nursing staff has not reported this either  Otherwise he does not have any other  complaints I when I did check his blood pressure it was low normal with a systolic of 403-KV is on a lisinopril  hydro chlorothiazide combination at 20-12.5 mg--he does not report any dizziness syncope previous blood pressures-listed range from  -136/79- 126/75- 120/80    Past Medical History:  Diagnosis Date  . GERD (gastroesophageal reflux disease)   . Hypertension   . Prostate CA [facility])    Past Surgical History:  Procedure Laterality Date  . [place]  . PROSTATE BIOPSY     positive for cancer  . TOTAL HIP ARTHROPLASTY Left    [facility] 11/07/17 Dr. Florian Buff    No Known Allergies  Outpatient Encounter Medications as of 11/14/2017  Medication Sig  . acetaminophen (TYLENOL) 325 MG tablet Take 975 mg by mouth every 8 (eight) hours as needed.  Marland Kitchen aspirin 325 MG EC tablet Take 325 mg by mouth 2 (two) times daily.  . cetirizine (ZYRTEC) 10 MG tablet Take 10 mg by mouth 2 (two) times daily.  . fluticasone (FLONASE) 50 MCG/ACT nasal spray Place 2 sprays into both nostrils daily.  Marland Kitchen gabapentin (NEURONTIN) 300 MG capsule Take 300 mg by mouth every 8 (eight) hours.  Marland Kitchen lisinopril-hydrochlorothiazide (PRINZIDE,ZESTORETIC) 20-25 MG per tablet Take 1 tablet by mouth daily.  . montelukast (SINGULAIR) 10 MG tablet Take 10 mg by mouth at bedtime.  . Multiple Vitamin (MULTIVITAMIN WITH MINERALS) TABS  tablet Take 1 tablet by mouth daily.  Marland Kitchen omeprazole (PRILOSEC) 20 MG capsule Take 20 mg by mouth 2 (two) times daily before a meal.   . ondansetron (ZOFRAN) 4 MG tablet Take 4 mg by mouth every 8 (eight) hours as needed for nausea or vomiting.  . Oxycodone HCl 10 MG TABS Take 10 mg by mouth 4 (four) times daily as needed.  . senna (SENOKOT) 8.6 MG tablet Take 2 tablets by mouth daily.   . tamsulosin (FLOMAX) 0.4 MG CAPS capsule Take 0.4 mg by mouth at bedtime.  . traZODone (DESYREL) 50 MG tablet Take 50 mg by mouth at bedtime.  . [DISCONTINUED] oxyCODONE (OXY IR/ROXICODONE) 5 MG  immediate release tablet Take 10 mg by mouth every 4 (four) hours as needed for severe pain.   . [DISCONTINUED] oxyCODONE (OXY IR/ROXICODONE) 5 MG immediate release tablet Take 15 mg by mouth every 3 (three) hours as needed for severe pain.  . [DISCONTINUED] oxyCODONE (OXY IR/ROXICODONE) 5 MG immediate release tablet Take 1 tablet (5 mg total) by mouth every 3 (three) hours as needed for severe pain.   No facility-administered encounter medications on file as of 11/14/2017.     Review of Systems   In general is not complaining of any fever chills  Skin does have a history of dermatitis with itching but is not complaining of that this morning  Head ears eyes nose mouth and throat is not complaining of sore throat or visual changes  Respiratory does not complain of shortness of breath cough or any respiratory depression  Cardiac does not complain  of chest pain palpitations or edema  Musculoskeletal does complain of some left hip discomfort as noted above\ \ Neurologic is not complaining of dizziness syncope or numbness  Psych does not complain of being overtly anxious or depressed is somewhat concerned about his pain issues      There is no immunization history on file for this patient. Pertinent  Health Maintenance Due  Topic Date Due  . COLONOSCOPY  12/13/2017 (Originally 01/28/2000)  . PNA vac Low Risk Adult (1 of 2 - PCV13) 12/13/2017 (Originally 01/28/2015)  . INFLUENZA VACCINE  12/11/2017   No flowsheet data found. Functional Status Survey:    Vitals:   11/14/17 1219  BP: 120/73  Pulse: 72  Resp: 18  Temp: 98.6 F (37 C)  TempSrc: Oral  SpO2: 100%  Of note manual blood pressure was 104/70 Pulse was in the mid 90s Physical Exam   In general this is a pleasant elderly male in no distress lying comfortably in bed  His skin is warm and dry he does have covering over his left hip surgical site with some serous drainage noted in the dressing  Eyes visual acuity  appears to be intact he does have arcus senilis   Chest is clear to auscultation there is no labored breathing   Heart is regular rate and rhythm without murmur gallop or rub he does not have significant lower extremity edema pedal pulses are intact bilaterally  Abdomen is soft nontender with positive bowel sounds  Musculoskeletal moves all extremities x4 with very limited motion of his left lower extremity status post surgery   Neurologic is grossly intact his speech is clear no lateralizing findings   Psych he is alert and oriented pleasant and appropriate  Labs reviewed: Recent Labs    11/12/17 0837  NA 139  K 3.7  CL 102  CO2 25  GLUCOSE 97  BUN 9  CREATININE 0.68  CALCIUM 9.6   Recent Labs    11/12/17 0837  AST 44*  ALT 34  ALKPHOS 66  BILITOT 1.1  PROT 8.4*  ALBUMIN 3.2*   Recent Labs    11/12/17 0837  WBC 7.9  NEUTROABS 5.6  HGB 10.1*  HCT 31.0*  MCV 96.3  PLT 305   No results found for: TSH No results found for: HGBA1C No results found for: CHOL, HDL, LDLCALC, LDLDIRECT, TRIG, CHOLHDL  Significant Diagnostic Results in last 30 days:  No results found.  Assessment/Plan  #1- history of left hip repair with pain issues--please see discussion above- this has been discussed with his orthopedic service as well and at this point we will continue the oxycodone 10 mg every 4 hours as needed--I will speak with nursing staff about trying to promptly address this when patient does ask for medication--  At this point will monitor--he appears to have tolerated the oxycodone well without signs of respiratory depression-confusion-or sedation   #2 hypertension- blood pressures appear to be stable to low normal-we will do a trial course of reducing his lisinopril hydrochlorothiazide down to 10-12.5 mg--hold for systolic blood pressure less than 105 will update a BMP next week with an eye on his electrolytes and renal function Monitor BP and Pulse Qshift  3  postop anemia- hemoglobin appears to be relatively stable at 10.1- we will update this next week  [id number]

## 2017-11-17 ENCOUNTER — Non-Acute Institutional Stay (SKILLED_NURSING_FACILITY): Payer: Self-pay | Admitting: Internal Medicine

## 2017-11-17 ENCOUNTER — Encounter: Payer: Self-pay | Admitting: Internal Medicine

## 2017-11-17 DIAGNOSIS — I10 Essential (primary) hypertension: Secondary | ICD-10-CM

## 2017-11-17 DIAGNOSIS — D649 Anemia, unspecified: Secondary | ICD-10-CM

## 2017-11-17 DIAGNOSIS — Z96642 Presence of left artificial hip joint: Secondary | ICD-10-CM

## 2017-11-17 DIAGNOSIS — R52 Pain, unspecified: Secondary | ICD-10-CM

## 2017-11-17 NOTE — Progress Notes (Signed)
Location:   [place] Room Number: 136/P Place of Service:  SNF [phone]) Provider:  Veleta Miners  System, Provider Not In  Patient Care Team: System, Provider Not In as PCP - General  Extended Emergency Contact Information Primary Emergency Contact: Senaida Ores, [place] [address] [place] Phone: [phone] Relation: Sister  Code Status:  Full Code Goals of care: Advanced Directive information Advanced Directives 11/17/2017  Does Patient Have a Medical Advance Directive? Yes  Type of Advance Directive (No Data)  Does patient want to make changes to medical advance directive? No - Patient declined  Would patient like information on creating a medical advance directive? -  Pre-existing out of facility DNR order (yellow form or pink MOST form) -     Chief Complaint  Patient presents with  . Acute Visit    Patients c/o Pain    HPI:  Pt is a 67 y.o. male seen today for an acute visit for Pain Management. Patient has h/o HTN, GERD, Anemia, Prostate Cancer And Avascular Necrosis Left hip Patient was hospitalized at the [facility] 6/28-6 11/11/17. He underwent L hip replacement on 6/28 . He is now in SNF for therapy. Nurses wanted him to be seen for Pain Control . Patient is on Oxycodone 10 mg Prn  Q 4 Hr.. But he says his pain is not controlled. He wanted to know if we can give him something else to help his pain beside Narcotics. He is also having some Constipation and is unable to rest at night due to pain  He is doing well with therapy and is Planning to eventually go home with his sister.    Past Medical History:  Diagnosis Date  . GERD (gastroesophageal reflux disease)   . Hypertension   . Prostate CA [facility]    Past Surgical History:  Procedure Laterality Date  . [place]  . PROSTATE BIOPSY     positive for cancer  . TOTAL HIP ARTHROPLASTY Left    [facility] 11/07/17 Dr. Florian Buff    No Known  Allergies  Outpatient Encounter Medications as of 11/17/2017  Medication Sig  . acetaminophen (TYLENOL) 325 MG tablet Take 975 mg by mouth every 8 (eight) hours as needed.  Marland Kitchen aspirin 325 MG EC tablet Take 325 mg by mouth 2 (two) times daily.  . cetirizine (ZYRTEC) 10 MG tablet Take 10 mg by mouth 2 (two) times daily.  . fluticasone (FLONASE) 50 MCG/ACT nasal spray Place 2 sprays into both nostrils daily.  Marland Kitchen gabapentin (NEURONTIN) 300 MG capsule Take 300 mg by mouth every 8 (eight) hours.  Marland Kitchen lisinopril-hydrochlorothiazide (PRINZIDE,ZESTORETIC) 20-25 MG per tablet Take 1 tablet by mouth daily.  . montelukast (SINGULAIR) 10 MG tablet Take 10 mg by mouth at bedtime.  . Multiple Vitamin (MULTIVITAMIN WITH MINERALS) TABS tablet Take 1 tablet by mouth daily.  Marland Kitchen omeprazole (PRILOSEC) 20 MG capsule Take 20 mg by mouth 2 (two) times daily before a meal.   . ondansetron (ZOFRAN) 4 MG tablet Take 4 mg by mouth every 8 (eight) hours as needed for nausea or vomiting.  . Oxycodone HCl 10 MG TABS Take 10 mg by mouth 4 (four) times daily as needed.  . senna (SENOKOT) 8.6 MG tablet Take 2 tablets by mouth daily.   . tamsulosin (FLOMAX) 0.4 MG CAPS capsule Take 0.4 mg by mouth at bedtime.  . traZODone (DESYREL)  50 MG tablet Take 50 mg by mouth at bedtime.   No facility-administered encounter medications on file as of 11/17/2017.      Review of Systems  Review of Systems  Constitutional: Negative for activity change, appetite change, chills, diaphoresis, fatigue and fever.  HENT: Negative for mouth sores, postnasal drip, rhinorrhea, sinus pain and sore throat.   Respiratory: Negative for apnea, cough, chest tightness, shortness of breath and wheezing.   Cardiovascular: Negative for chest pain, palpitations and leg swelling.  Gastrointestinal: Negative for abdominal distention, abdominal pain,  Genitourinary: Negative for dysuria and frequency.  Musculoskeletal: Negative for arthralgias, joint swelling and  myalgias.  Skin: Negative for rash.  Neurological: Negative for dizziness, syncope, weakness, light-headedness and numbness.  Psychiatric/Behavioral: Negative for behavioral problems, confusion     There is no immunization history on file for this patient. Pertinent  Health Maintenance Due  Topic Date Due  . COLONOSCOPY  12/13/2017 (Originally 01/28/2000)  . PNA vac Low Risk Adult (1 of 2 - PCV13) 12/13/2017 (Originally 01/28/2015)  . INFLUENZA VACCINE  12/11/2017   No flowsheet data found. Functional Status Survey:    Vitals:   11/17/17 1036  BP: 104/68  Pulse: 92  Resp: 18  Temp: 98.1 F (36.7 C)  TempSrc: Oral  SpO2: 99%   There is no height or weight on file to calculate BMI. Physical Exam  Constitutional: He is oriented to person, place, and time. He appears well-developed and well-nourished.  HENT:  Head: Normocephalic.  Mouth/Throat: Oropharynx is clear and moist.  Eyes: Pupils are equal, round, and reactive to light.  Neck: Neck supple.  Cardiovascular: Normal rate and regular rhythm.  No murmur heard. Pulmonary/Chest: Effort normal and breath sounds normal. No stridor. No respiratory distress. He has no wheezes.  Abdominal: Soft. Bowel sounds are normal. He exhibits no distension. There is no tenderness. There is no guarding.  Musculoskeletal: He exhibits no edema.  Neurological: He is alert and oriented to person, place, and time.  Skin: Skin is warm and dry.  Psychiatric: He has a normal mood and affect. His behavior is normal.    Labs reviewed: Recent Labs    11/12/17 0837  NA 139  K 3.7  CL 102  CO2 25  GLUCOSE 97  BUN 9  CREATININE 0.68  CALCIUM 9.6   Recent Labs    11/12/17 0837  AST 44*  ALT 34  ALKPHOS 66  BILITOT 1.1  PROT 8.4*  ALBUMIN 3.2*   Recent Labs    11/12/17 0837  WBC 7.9  NEUTROABS 5.6  HGB 10.1*  HCT 31.0*  MCV 96.3  PLT 305   No results found for: TSH No results found for: HGBA1C No results found for: CHOL,  HDL, LDLCALC, LDLDIRECT, TRIG, CHOLHDL  Significant Diagnostic Results in last 30 days:  No results found.  Assessment/Plan  Hypertension His BP better since his Antihypertensive dose was reduced.  S/P hip replacement, left Doing well with Therapy Follow up with Ortho in [place] this week  Pain management D/W patient . At this time we will not increase Oxycodone . Continue 10 mg Q4  But will try Muscle relaxant Robaxin  Normochromic normocytic anemia Hgb Stable Repeat CBC  Constipation Start on Miralax.   Family/ staff Communication:   Labs/tests ordered:   Total time spent in this patient care encounter was 25_ minutes; greater than 50% of the visit spent counseling patient, reviewing records , Labs and coordinating care for problems addressed at this encounter.

## 2017-11-18 ENCOUNTER — Other Ambulatory Visit: Payer: Self-pay

## 2017-11-18 MED ORDER — OXYCODONE HCL 10 MG PO TABS: 10.0000 mg | ORAL_TABLET | Freq: Four times a day (QID) | ORAL | 0 refills | Status: DC | PRN

## 2017-11-18 NOTE — Telephone Encounter (Signed)
RX Fax for [facility]@ [phone]  

## 2017-11-19 DIAGNOSIS — Z96642 Presence of left artificial hip joint: Secondary | ICD-10-CM

## 2017-11-19 HISTORY — DX: Presence of left artificial hip joint: Z96.642

## 2017-11-21 ENCOUNTER — Encounter (HOSPITAL_COMMUNITY)
Admission: RE | Admit: 2017-11-21 | Discharge: 2017-11-21 | Disposition: A | Discharge status: Home / Self Care | Payer: Medicare Other | Source: Skilled Nursing Facility | Attending: *Deleted | Admitting: *Deleted

## 2017-11-21 ENCOUNTER — Encounter (HOSPITAL_COMMUNITY)
Admission: RE | Admit: 2017-11-21 | Discharge: 2017-11-21 | Disposition: A | Discharge status: Home / Self Care | Payer: Medicare Other | Source: Skilled Nursing Facility | Attending: Internal Medicine | Admitting: Internal Medicine

## 2017-11-21 DIAGNOSIS — I10 Essential (primary) hypertension: Secondary | ICD-10-CM | POA: Insufficient documentation

## 2017-11-21 DIAGNOSIS — J309 Allergic rhinitis, unspecified: Secondary | ICD-10-CM | POA: Insufficient documentation

## 2017-11-21 DIAGNOSIS — M1612 Unilateral primary osteoarthritis, left hip: Secondary | ICD-10-CM | POA: Insufficient documentation

## 2017-11-21 DIAGNOSIS — Z471 Aftercare following joint replacement surgery: Secondary | ICD-10-CM | POA: Insufficient documentation

## 2017-11-21 LAB — CBC WITH DIFFERENTIAL/PLATELET
Basophils Absolute: 0 10*3/uL (ref 0.0–0.1)
Basophils Relative: 0 %
EOS ABS: 0.2 10*3/uL (ref 0.0–0.7)
EOS PCT: 2 %
HCT: 28.8 % — ABNORMAL LOW (ref 39.0–52.0)
Hemoglobin: 9.2 g/dL — ABNORMAL LOW (ref 13.0–17.0)
LYMPHS ABS: 1.8 10*3/uL (ref 0.7–4.0)
Lymphocytes Relative: 21 %
MCH: 30.5 pg (ref 26.0–34.0)
MCHC: 31.9 g/dL (ref 30.0–36.0)
MCV: 95.4 fL (ref 78.0–100.0)
MONOS PCT: 8 %
Monocytes Absolute: 0.7 10*3/uL (ref 0.1–1.0)
Neutro Abs: 5.9 10*3/uL (ref 1.7–7.7)
Neutrophils Relative %: 69 %
PLATELETS: 471 10*3/uL — AB (ref 150–400)
RBC: 3.02 MIL/uL — AB (ref 4.22–5.81)
RDW: 14.3 % (ref 11.5–15.5)
WBC: 8.6 10*3/uL (ref 4.0–10.5)

## 2017-11-21 LAB — BASIC METABOLIC PANEL
Anion gap: 6 (ref 5–15)
BUN: 8 mg/dL (ref 8–23)
CALCIUM: 8.7 mg/dL — AB (ref 8.9–10.3)
CO2: 27 mmol/L (ref 22–32)
CREATININE: 0.66 mg/dL (ref 0.61–1.24)
Chloride: 107 mmol/L (ref 98–111)
GFR calc Af Amer: >60 mL/min (ref >60)
GFR calc non Af Amer: >60 mL/min (ref >60)
Glucose, Bld: 116 mg/dL — ABNORMAL HIGH (ref 70–99)
Potassium: 3.3 mmol/L — ABNORMAL LOW (ref 3.5–5.1)
SODIUM: 140 mmol/L (ref 135–145)

## 2017-11-21 LAB — OCCULT BLOOD X 1 CARD TO LAB, STOOL: Fecal Occult Bld: NEGATIVE

## 2017-11-22 ENCOUNTER — Encounter (HOSPITAL_COMMUNITY)
Admission: RE | Admit: 2017-11-22 | Discharge: 2017-11-22 | Disposition: A | Discharge status: Home / Self Care | Payer: Medicare Other | Source: Skilled Nursing Facility | Attending: *Deleted | Admitting: *Deleted

## 2017-11-22 LAB — OCCULT BLOOD X 1 CARD TO LAB, STOOL: Fecal Occult Bld: POSITIVE — AB

## 2017-11-24 ENCOUNTER — Other Ambulatory Visit (HOSPITAL_COMMUNITY)
Admission: RE | Admit: 2017-11-24 | Discharge: 2017-11-24 | Disposition: A | Discharge status: Home / Self Care | Payer: Non-veteran care | Source: Skilled Nursing Facility | Attending: Internal Medicine | Admitting: Internal Medicine

## 2017-11-24 ENCOUNTER — Non-Acute Institutional Stay (SKILLED_NURSING_FACILITY): Payer: Self-pay | Admitting: Internal Medicine

## 2017-11-24 ENCOUNTER — Encounter: Payer: Self-pay | Admitting: Internal Medicine

## 2017-11-24 DIAGNOSIS — K219 Gastro-esophageal reflux disease without esophagitis: Secondary | ICD-10-CM

## 2017-11-24 DIAGNOSIS — Z96642 Presence of left artificial hip joint: Secondary | ICD-10-CM

## 2017-11-24 DIAGNOSIS — R262 Difficulty in walking, not elsewhere classified: Secondary | ICD-10-CM | POA: Insufficient documentation

## 2017-11-24 DIAGNOSIS — M6281 Muscle weakness (generalized): Secondary | ICD-10-CM | POA: Insufficient documentation

## 2017-11-24 DIAGNOSIS — I10 Essential (primary) hypertension: Secondary | ICD-10-CM

## 2017-11-24 DIAGNOSIS — Z471 Aftercare following joint replacement surgery: Secondary | ICD-10-CM | POA: Insufficient documentation

## 2017-11-24 DIAGNOSIS — D649 Anemia, unspecified: Secondary | ICD-10-CM

## 2017-11-24 DIAGNOSIS — M1612 Unilateral primary osteoarthritis, left hip: Secondary | ICD-10-CM | POA: Insufficient documentation

## 2017-11-24 DIAGNOSIS — E876 Hypokalemia: Secondary | ICD-10-CM

## 2017-11-24 LAB — BASIC METABOLIC PANEL
ANION GAP: 9 (ref 5–15)
BUN: 6 mg/dL — ABNORMAL LOW (ref 8–23)
CALCIUM: 9.2 mg/dL (ref 8.9–10.3)
CO2: 25 mmol/L (ref 22–32)
Chloride: 105 mmol/L (ref 98–111)
Creatinine, Ser: 0.72 mg/dL (ref 0.61–1.24)
GFR calc Af Amer: >60 mL/min (ref >60)
Glucose, Bld: 99 mg/dL (ref 70–99)
POTASSIUM: 3.6 mmol/L (ref 3.5–5.1)
SODIUM: 139 mmol/L (ref 135–145)

## 2017-11-24 LAB — CBC WITH DIFFERENTIAL/PLATELET
BASOS ABS: 0 10*3/uL (ref 0.0–0.1)
BASOS PCT: 0 %
EOS ABS: 0.2 10*3/uL (ref 0.0–0.7)
EOS PCT: 3 %
HCT: 33.7 % — ABNORMAL LOW (ref 39.0–52.0)
Hemoglobin: 10.5 g/dL — ABNORMAL LOW (ref 13.0–17.0)
Lymphocytes Relative: 25 %
Lymphs Abs: 1.9 10*3/uL (ref 0.7–4.0)
MCH: 30 pg (ref 26.0–34.0)
MCHC: 31.2 g/dL (ref 30.0–36.0)
MCV: 96.3 fL (ref 78.0–100.0)
MONO ABS: 0.9 10*3/uL (ref 0.1–1.0)
Monocytes Relative: 12 %
Neutro Abs: 4.7 10*3/uL (ref 1.7–7.7)
Neutrophils Relative %: 61 %
Platelets: 564 10*3/uL — ABNORMAL HIGH (ref 150–400)
RBC: 3.5 MIL/uL — AB (ref 4.22–5.81)
RDW: 14.5 % (ref 11.5–15.5)
WBC: 7.7 10*3/uL (ref 4.0–10.5)

## 2017-11-24 LAB — MAGNESIUM: Magnesium: 1.9 mg/dL (ref 1.7–2.4)

## 2017-11-24 LAB — OCCULT BLOOD X 1 CARD TO LAB, STOOL: FECAL OCCULT BLD: NEGATIVE

## 2017-11-24 NOTE — Progress Notes (Signed)
Location:   [facility] [place] Room Number: 136/P Place of Service:  SNF (31)  Provider: Granville Lewis  PCP: System, Provider Not In Patient Care Team: System, Provider Not In as PCP - General  Extended Emergency Contact Information Primary Emergency Contact: Senaida Ores, [place] [address] [place] Phone: [phone] Relation: Sister  Code Status: Full Code Goals of care:  Advanced Directive information Advanced Directives 11/24/2017  Does Patient Have a Medical Advance Directive? Yes  Type of Advance Directive (No Data)  Does patient want to make changes to medical advance directive? No - Patient declined  Would patient like information on creating a medical advance directive? -  Pre-existing out of facility DNR order (yellow form or pink MOST form) -     No Known Allergies  Chief Complaint  Patient presents with  . Discharge Note    This is a Discharge Visit    HPI:  67 y.o. male seen today for scheduled discharge tomorrow from facility  Patient has been here for rehab after undergoing a left hip replacement June 28 at the [place] in [place].  He does have a history of avascular necrosis of the left hip  He has done well with therapy and will be going home he will need continued PT and OT- he will have follow-up at the [place].  Patient's pain control has been somewhat of an issue during his stay here he is on oxycodone 10 mg every 4 hours and usually asks for it every 4 hours-he  also been started on Robaxin and he says the combination appears to give him some relief without any oversedation  He continues on aspirin 325 mg a day for DVT prophylaxis.  He says he is ambulating well and has done well with therapy-  He also was found to be mildly hypokalemic on recent lab 3.3-we did supplement this with 20 mEq potassium- magnesium level was normal at 1.9-he says he has some history of hypokalemia and we advised him to take it  possibly every other day he will need follow-up labs in about a week to ensure stability   He also was found to have a slight drop in hemoglobin down to 9.2 last week however today's lab shows it back up at 10.5--he has been started on iron he states he does have a history of anemia and has been on iron before.  We did order guaic  stools and one actually has come back positive  Appears he did see the GI nurse practitioner Deberah Castle approximately a year ago- his GERD was thought to be under control with the Omeprazole--but thought he would need surveillance to be sure he does not have peptic ulcer disease--- Per patient he thinks he supposed to have at some point an endoscopy- he will need follow-up with GI to follow-up that issue as well as occult positive stool although again his hemoglobin has bounced back  Currently has no complaints she is looking forward to going home vital signs appear stable.  His lisinopril hydrochlorothiazide combination was reduced recently secondary to concerns of some lower blood pressures stable --this appears to be stable now I got a blood pressure of 110/68 today previous blood pressures 111/72 earlier today- 120/66-123/65 116/63 other recent blood pressures.  He will be going home apparently by himself we will need continued PT and OT as well as home health support  Past Medical History:  Diagnosis Date  .  GERD (gastroesophageal reflux disease)   . Hypertension   . Prostate CA [facility])     Past Surgical History:  Procedure Laterality Date  . [place]  . PROSTATE BIOPSY     positive for cancer  . TOTAL HIP ARTHROPLASTY Left    [facility] 11/07/17 Dr. Florian Buff      reports that he has never smoked. He has never used smokeless tobacco. He reports that he drinks alcohol. He reports that he has current or past drug history. Drug: Marijuana. Social History   Socioeconomic History  . Marital status: Widowed    Spouse name: Not on  file  . Number of children: Not on file  . Years of education: Not on file  . Highest education level: Not on file  Occupational History  . Not on file  Social Needs  . Financial resource strain: Not on file  . Food insecurity:    Worry: Not on file    Inability: Not on file  . Transportation needs:    Medical: Not on file    Non-medical: Not on file  Tobacco Use  . Smoking status: Never Smoker  . Smokeless tobacco: Never Used  Substance and Sexual Activity  . Alcohol use: Yes    Comment: 2 beers a day  . Drug use: Yes    Types: Marijuana  . Sexual activity: Not on file  Lifestyle  . Physical activity:    Days per week: Not on file    Minutes per session: Not on file  . Stress: Not on file  Relationships  . Social connections:    Talks on phone: Not on file    Gets together: Not on file    Attends religious service: Not on file    Active member of club or organization: Not on file    Attends meetings of clubs or organizations: Not on file    Relationship status: Not on file  . Intimate partner violence:    Fear of current or ex partner: Not on file    Emotionally abused: Not on file    Physically abused: Not on file    Forced sexual activity: Not on file  Other Topics Concern  . Not on file  Social History Narrative  . Not on file   Functional Status Survey:    No Known Allergies  Pertinent  Health Maintenance Due  Topic Date Due  . COLONOSCOPY  12/13/2017 (Originally 01/28/2000)  . PNA vac Low Risk Adult (1 of 2 - PCV13) 12/13/2017 (Originally 01/28/2015)  . INFLUENZA VACCINE  12/11/2017    Medications: Outpatient Encounter Medications as of 11/24/2017  Medication Sig  . acetaminophen (TYLENOL) 325 MG tablet Take 975 mg by mouth every 8 (eight) hours as needed.  Marland Kitchen aspirin 325 MG EC tablet Take 325 mg by mouth 2 (two) times daily.  . cetirizine (ZYRTEC) 10 MG tablet Take 10 mg by mouth 2 (two) times daily.  . Ferrous Sulfate (IRON) 325 (65 Fe) MG TABS Take  1 tablet by mouth once a day  . fluticasone (FLONASE) 50 MCG/ACT nasal spray Place 2 sprays into both nostrils daily.  Marland Kitchen gabapentin (NEURONTIN) 300 MG capsule Take 300 mg by mouth every 8 (eight) hours.  Marland Kitchen lisinopril-hydrochlorothiazide (PRINZIDE,ZESTORETIC) 10-12.5 MG tablet Take 1 tablet by mouth daily.  . methocarbamol (ROBAXIN) 500 MG tablet Take 500 mg by mouth 3 (three) times daily as needed for muscle spasms.  . montelukast (SINGULAIR) 10  MG tablet Take 10 mg by mouth at bedtime.  . Multiple Vitamin (MULTIVITAMIN WITH MINERALS) TABS tablet Take 1 tablet by mouth daily.  Marland Kitchen omeprazole (PRILOSEC) 20 MG capsule Take 20 mg by mouth 2 (two) times daily before a meal.   . ondansetron (ZOFRAN) 4 MG tablet Take 4 mg by mouth every 8 (eight) hours as needed for nausea or vomiting.  . Oxycodone HCl 10 MG TABS Take 1 tablet (10 mg total) by mouth 4 (four) times daily as needed.  . polyethylene glycol (MIRALAX / GLYCOLAX) packet Take 17 g by mouth daily.  . potassium chloride SA (K-DUR,KLOR-CON) 20 MEQ tablet Take 20 mEq by mouth every evening.  . senna (SENOKOT) 8.6 MG tablet Take 2 tablets by mouth daily.   . tamsulosin (FLOMAX) 0.4 MG CAPS capsule Take 0.4 mg by mouth at bedtime.  . traZODone (DESYREL) 50 MG tablet Take 50 mg by mouth at bedtime.  . [DISCONTINUED] lisinopril-hydrochlorothiazide (PRINZIDE,ZESTORETIC) 20-25 MG per tablet Take 1 tablet by mouth daily.   No facility-administered encounter medications on file as of 11/24/2017.      Review of Systems   In general is not complaining of any fever or chills.  Skin does not complain of rashes or itching he does have a healing surgical scar left hip.  Head ears eyes nose mouth and throat is not complaining of any sore throat or visual changes.  Respiratory is not complaining of shortness breath or cough.  Cardiac is not complaining of any chest pain does not appear to have significant lower extremity edema.  GI at one point  constipation was an issue but this appears stable on Senokot and MiraLAX says he is having regular bowel movements does not complain of any diarrhea abdominal pain nausea or vomiting.  GU is not complaining of dysuria.  Musculoskeletal- has apparently used his oxycodone regularly every 4 hours-he is not really complaining of pain to me today-although apparently he has complained of pain if the oxycodone is not given every 4 hours  Neurologic is not complaining of dizziness headache numbness or syncope  Psych is not complaining of being anxious or depressed appears to be in good spirits  Vitals:   11/24/17 1251  BP: 111/72  Pulse: 88  Resp: 20  Temp: 98.3 F (36.8 C)  TempSrc: Oral  Manual l blood pressure today was 110/68-pulse was 84  Physical Exam   In general this is a pleasant elderly male in no distress   His skin is warm and dry he does have a healing surgical scar left hip I do not see signs of infection with drainage bleeding concerning warmth or tenderness.  Eyes visual acuity appears to be intact sclera and conjunctive are clear.  Oropharynx clear mucous membranes moist.  Chest is clear to auscultation there is no labored breathing.   Heart Is regular rate and rhythm without murmur gallop or rub he does not have significant lower extremity edema Pedal pulses are intact bilaterally  Abdomen is soft nontender with positive bowel sounds  Musculoskeletal is able to move all extremities x4 strength appears to be intact with some weakness status post surgery of the left lower extremity- he is now ambulating with a walker  Neurologic is grossly intact his speech is clear no lateralizing findings  Psych he is alert and oriented pleasant and appropriate   Labs reviewed: Basic Metabolic Panel: Recent Labs    11/12/17 0837 11/21/17 0700 11/24/17 0537  NA 139 140 139  K  3.7 3.3* 3.6  CL 102 107 105  CO2 25 27 25   GLUCOSE 97 116* 99  BUN 9 8 6*  CREATININE 0.68  0.66 0.72  CALCIUM 9.6 8.7* 9.2  MG  --   --  1.9   Liver Function Tests: Recent Labs    11/12/17 0837  AST 44*  ALT 34  ALKPHOS 66  BILITOT 1.1  PROT 8.4*  ALBUMIN 3.2*   No results for input(s): LIPASE, AMYLASE in the last 8760 hours. No results for input(s): AMMONIA in the last 8760 hours. CBC: Recent Labs    11/12/17 0837 11/21/17 0700 11/24/17 0537  WBC 7.9 8.6 7.7  NEUTROABS 5.6 5.9 4.7  HGB 10.1* 9.2* 10.5*  HCT 31.0* 28.8* 33.7*  MCV 96.3 95.4 96.3  PLT 305 471* 564*   Cardiac Enzymes: No results for input(s): CKTOTAL, CKMB, CKMBINDEX, TROPONINI in the last 8760 hours. BNP: Invalid input(s): POCBNP CBG: No results for input(s): GLUCAP in the last 8760 hours.  Procedures and Imaging Studies During Stay: No results found.  Assessment/Plan:     1- history of left hip replacement secondary to avascular necrosis-apparently tolerated the procedure well- pain has been somewhat of an issue but he appears to be doing okay with the oxycodone he is quite insistent on taking it every 4 hours-he also has the Robaxin as needed.  Neurontin 3 times a day orally possible  Continues on aspirin 325 mg a day 1 month status post surgery and he will have follow up by orthopedics at [place]  #2- anemia-apparently there is some chronicity  to this his hemoglobin has bounced back up to 10.5 today--was 9.2 last Friday-.  We have started iron I advised him to take this once a day with follow-up by primary care provider he also continues on omeprazole    He has also has an occult positive stool- he has been followed by GI as noted above will write an order to have follow-up by them upon discharge.  Also update labs in approximately a week to keep an eye on his hemoglobin suspect this will have to be drawn by home health and primary care provider notified of results   #3 hypokalemia this was fairly mild and has resolved with potassium supplementation- he also states some chronicity to  this issue- advised him to take his potassium pill every other day this will need rechecking in about a week as well  #4- history of neuropathy continues on Neurontin 3 times daily is not really complaining of numbness today  5.  History of hypertension-blood pressures were little low when he first came we reduced his lisinopril hydrochlorothiazide- this appears to be stable as noted above --does not show signs of hypotension.  6.-  Constipation- per patient this is not an issue anymore he is on Senokot and MiraLAX.  7.  History of GERD-again he is on Omeprazole--will need follow-up by GI   #8 history of allergic rhinitis--he is on Zyrtec routinely as well as Montelukast--has Flonase as needed stay here.   #9 Insomnia continues on trazodone at night.    [id number] greater than 30 minutes spent on this discharge summary-greater than 50% of time spent coordinating a plan of care for numerous diagnoses

## 2017-12-01 ENCOUNTER — Encounter (HOSPITAL_COMMUNITY)
Admission: RE | Admit: 2017-12-01 | Discharge: 2017-12-01 | Disposition: A | Discharge status: Home / Self Care | Payer: Medicare Other | Source: Skilled Nursing Facility | Attending: *Deleted | Admitting: *Deleted

## 2017-12-16 ENCOUNTER — Emergency Department (HOSPITAL_COMMUNITY): Payer: Non-veteran care

## 2017-12-16 ENCOUNTER — Encounter (HOSPITAL_COMMUNITY): Payer: Self-pay | Admitting: Emergency Medicine

## 2017-12-16 ENCOUNTER — Emergency Department (HOSPITAL_COMMUNITY)
Admission: EM | Admit: 2017-12-16 | Discharge: 2017-12-16 | Disposition: A | Discharge status: Home / Self Care | Payer: Non-veteran care | Attending: Emergency Medicine | Admitting: Emergency Medicine

## 2017-12-16 ENCOUNTER — Other Ambulatory Visit: Payer: Self-pay

## 2017-12-16 DIAGNOSIS — M25552 Pain in left hip: Secondary | ICD-10-CM | POA: Diagnosis not present

## 2017-12-16 DIAGNOSIS — I10 Essential (primary) hypertension: Secondary | ICD-10-CM | POA: Insufficient documentation

## 2017-12-16 DIAGNOSIS — Z7982 Long term (current) use of aspirin: Secondary | ICD-10-CM | POA: Diagnosis not present

## 2017-12-16 DIAGNOSIS — Z96642 Presence of left artificial hip joint: Secondary | ICD-10-CM | POA: Insufficient documentation

## 2017-12-16 DIAGNOSIS — Z79899 Other long term (current) drug therapy: Secondary | ICD-10-CM | POA: Diagnosis not present

## 2017-12-16 MED ORDER — CYCLOBENZAPRINE HCL 10 MG PO TABS: 10.0000 mg | ORAL_TABLET | Freq: Three times a day (TID) | ORAL | 0 refills | Status: DC | PRN

## 2017-12-16 MED ORDER — HYDROMORPHONE HCL 1 MG/ML IJ SOLN
1.0000 mg | Freq: Once | INTRAMUSCULAR | Status: AC
  Administered 2017-12-16: 1 mg via INTRAMUSCULAR
  Filled 2017-12-16: qty 1

## 2017-12-16 NOTE — ED Notes (Signed)
Pt returned from xray

## 2017-12-16 NOTE — ED Notes (Signed)
ED Provider at bedside. 

## 2017-12-16 NOTE — ED Notes (Addendum)
Pt c/o left hip pain with continued pain since surgery a month ago.  Pt prescribed oxycodone for pain and pt states no relief.  C/o tingling and going down leg. Had surgery done at [facility]

## 2017-12-16 NOTE — ED Triage Notes (Addendum)
Patient states he had left hip surgery 1 month ago and was taking 10 mg oxycodone for his pain. States his doctor is out of town so the nurse called in 5 mg oxycodone and "they ain't helping my pain at all, I need 10 mg oxycodone." Denies any recent injury.

## 2017-12-16 NOTE — ED Notes (Signed)
Patient transported to X-ray 

## 2017-12-16 NOTE — Discharge Instructions (Addendum)
Take your oxycodone 5 mg, one every 4 hrs.  Until you follow-up with your primary provider or surgeon.

## 2017-12-19 NOTE — ED Provider Notes (Signed)
[facility] EMERGENCY DEPARTMENT Provider Note   CSN: [id number] Arrival date & time: 12/16/17  0857     History   Chief Complaint Chief Complaint  Patient presents with  . Hip Pain    HPI Jonathan Benitez is a 67 y.o. male.  HPI   Jonathan Benitez is a 67 y.o. male who presents to the Emergency Department requesting pain control of left hip pain.  He is one month post op from a total hip replacement performed at [place] in [place] and now on PT at home.  Was taking 10mg  Oxycodone, ran out of his medication, and his PCP is out of town and he was prescribed 5 mg tablets instead.  He is requesting refill of his 10 mg tablets stating the "5 mg pills ain't helping my pain" states he has tried taking two at once, but pain still not controlled.  He denies fever, chills, abdominal pain, redness or swelling of the surgical site, increased pain and recent injury.   Past Medical History:  Diagnosis Date  . GERD (gastroesophageal reflux disease)   . Hypertension   . Prostate CA [facility])     Patient Active Problem List   Diagnosis Date Noted  . 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 Avascular necrosis of hip, left ([place]) 11/12/2017  . Hypoalbuminemia 11/12/2017  . Gastroesophageal reflux disease without esophagitis 12/19/2016  . Normochromic normocytic anemia 06/28/2012  . Prostate cancer ([place]) 06/28/2012  . HTN (hypertension) 06/28/2012  . Crohn's disease ([place]) 06/28/2012  . Alcoholism ([place]) 06/28/2012    Past Surgical History:  Procedure Laterality Date  . [place]  . PROSTATE BIOPSY     positive for cancer  . TOTAL HIP ARTHROPLASTY Left    [facility] 11/07/17 Dr. Florian Buff        Home Medications    Prior to Admission medications   Medication Sig Start Date End Date Taking? Authorizing Provider  acetaminophen (TYLENOL) 325 MG tablet Take 975 mg by mouth every 8 (eight) hours as needed.    [provider]  aspirin 325 MG EC tablet Take 325 mg  by mouth 2 (two) times daily.    [provider]  cetirizine (ZYRTEC) 10 MG tablet Take 10 mg by mouth 2 (two) times daily.    [provider]  cyclobenzaprine (FLEXERIL) 10 MG tablet Take 1 tablet (10 mg total) by mouth 3 (three) times daily as needed. 12/16/17   Webb Weed, PA-C  Ferrous Sulfate (IRON) 325 (65 Fe) MG TABS Take 1 tablet by mouth once a day    [provider]  fluticasone (FLONASE) 50 MCG/ACT nasal spray Place 2 sprays into both nostrils daily.    [provider]  gabapentin (NEURONTIN) 300 MG capsule Take 300 mg by mouth every 8 (eight) hours.    [provider]  lisinopril-hydrochlorothiazide (PRINZIDE,ZESTORETIC) 10-12.5 MG tablet Take 1 tablet by mouth daily.    [provider]  montelukast (SINGULAIR) 10 MG tablet Take 10 mg by mouth at bedtime.    [provider]  Multiple Vitamin (MULTIVITAMIN WITH MINERALS) TABS tablet Take 1 tablet by mouth daily.    [provider]  omeprazole (PRILOSEC) 20 MG capsule Take 20 mg by mouth 2 (two) times daily before a meal.     [provider]  ondansetron (ZOFRAN) 4 MG tablet Take 4 mg by mouth every 8 (eight) hours as needed for nausea or vomiting.  [provider]  Oxycodone HCl 10 MG TABS Take 1 tablet (10 mg total) by mouth 4 (four) times daily as needed. 11/18/17   Virgie Dad, MD  polyethylene glycol (MIRALAX / GLYCOLAX) packet Take 17 g by mouth daily.    [provider]  potassium chloride SA (K-DUR,KLOR-CON) 20 MEQ tablet Take 20 mEq by mouth every evening.    [provider]  senna (SENOKOT) 8.6 MG tablet Take 2 tablets by mouth daily.     [provider]  tamsulosin (FLOMAX) 0.4 MG CAPS capsule Take 0.4 mg by mouth at bedtime.    [provider]  traZODone (DESYREL) 50 MG tablet Take 50 mg by mouth at bedtime.    [provider]    Family History Family History  Problem Relation Age of  Onset  . Hypertension Mother   . Hypertension Father   . Stroke Father     Social History Social History   Tobacco Use  . Smoking status: Never Smoker  . Smokeless tobacco: Never Used  Substance Use Topics  . Alcohol use: Yes    Comment: 2 beers a day  . Drug use: Yes    Types: Marijuana     Allergies   Patient has no known allergies.   Review of Systems Review of Systems  Constitutional: Negative for chills and fever.  Respiratory: Negative for shortness of breath.   Cardiovascular: Negative for chest pain.  Gastrointestinal: Negative for abdominal pain, nausea and vomiting.  Genitourinary: Negative for difficulty urinating and dysuria.  Musculoskeletal: Positive for arthralgias (left hip pain). Negative for joint swelling.  Skin: Negative for color change and wound.  Neurological: Negative for weakness and numbness.  All other systems reviewed and are negative.    Physical Exam Updated Vital Signs BP (!) 123/98 (BP Location: Left Arm)   Pulse (!) 109   Temp 98.3 F (36.8 C) (Oral)   Resp 16   Ht 6' (1.829 m)   Wt 79.8 kg   SpO2 100%   BMI 23.87 kg/m   Physical Exam  Constitutional: He appears well-developed. No distress.  HENT:  Head: Atraumatic.  Mouth/Throat: Oropharynx is clear and moist.  Neck: Normal range of motion.  Cardiovascular: Normal rate, regular rhythm and intact distal pulses.  Pulmonary/Chest: Effort normal. No respiratory distress.  Abdominal: Soft. He exhibits no distension. There is no tenderness.  Musculoskeletal: Normal range of motion.  Mild ttp of the lateral left hip.  Pt ambulating well.  Using a cane .  Neurological: He is alert. No sensory deficit.  Skin: Skin is warm. Capillary refill takes less than 2 seconds. No rash noted.  Surgical incision of the lateral left hip appears to be healing well.  No edema or erythema.    Nursing note and vitals reviewed.    ED Treatments / Results  Labs (all labs ordered are listed,  but only abnormal results are displayed) Labs Reviewed - No data to display  EKG None  Radiology No results found.  Procedures Procedures (including critical care time)  Medications Ordered in ED Medications  HYDROmorphone (DILAUDID) injection 1 mg (1 mg Intramuscular Given 12/16/17 1245)     Initial Impression / Assessment and Plan / ED Course  I have reviewed the triage vital signs and the nursing notes.  Pertinent labs & imaging results that were available during my care of the patient were reviewed by me and considered in my medical decision making (see chart for details).  Pt has prescription for 5 mg oxycodone.  Left hip pain that is chronic.  No concerning sx's for septic joint.  NV intact and he is ambulatory with a steady giat.  Pt's pain addressed here, he understands that he will not be receiving additional prescription narcotic.  Agrees to close PCP f/u for further pain management.    Final Clinical Impressions(s) / ED Diagnoses   Final diagnoses:  Left hip pain    ED Discharge Orders         Ordered    cyclobenzaprine (FLEXERIL) 10 MG tablet  3 times daily PRN     12/16/17 1246           Kem Parkinson, PA-C 12/19/17 2051    Francine Graven, DO 12/24/17 2155

## 2018-03-25 ENCOUNTER — Telehealth (INDEPENDENT_AMBULATORY_CARE_PROVIDER_SITE_OTHER): Payer: Self-pay | Admitting: *Deleted

## 2018-03-25 ENCOUNTER — Ambulatory Visit (INDEPENDENT_AMBULATORY_CARE_PROVIDER_SITE_OTHER): Payer: Non-veteran care | Admitting: Internal Medicine

## 2018-03-25 ENCOUNTER — Encounter (INDEPENDENT_AMBULATORY_CARE_PROVIDER_SITE_OTHER): Payer: Self-pay | Admitting: *Deleted

## 2018-03-25 ENCOUNTER — Ambulatory Visit (INDEPENDENT_AMBULATORY_CARE_PROVIDER_SITE_OTHER): Payer: No Typology Code available for payment source | Admitting: Internal Medicine

## 2018-03-25 ENCOUNTER — Encounter (INDEPENDENT_AMBULATORY_CARE_PROVIDER_SITE_OTHER): Payer: Self-pay | Admitting: Internal Medicine

## 2018-03-25 VITALS — BP 166/80 | HR 64 | Temp 98.2°F | Ht 72.0 in | Wt 187.2 lb

## 2018-03-25 DIAGNOSIS — B1710 Acute hepatitis C without hepatic coma: Secondary | ICD-10-CM

## 2018-03-25 DIAGNOSIS — D649 Anemia, unspecified: Secondary | ICD-10-CM | POA: Insufficient documentation

## 2018-03-25 DIAGNOSIS — K6289 Other specified diseases of anus and rectum: Secondary | ICD-10-CM | POA: Insufficient documentation

## 2018-03-25 DIAGNOSIS — D508 Other iron deficiency anemias: Secondary | ICD-10-CM | POA: Diagnosis not present

## 2018-03-25 DIAGNOSIS — K625 Hemorrhage of anus and rectum: Secondary | ICD-10-CM | POA: Insufficient documentation

## 2018-03-25 MED ORDER — PEG 3350-KCL-NA BICARB-NACL 420 G PO SOLR: 4000.0000 mL | Freq: Once | ORAL | 0 refills | Status: AC

## 2018-03-25 NOTE — Patient Instructions (Signed)
The risks of bleeding, perforation and infection were reviewed with patient.  

## 2018-03-25 NOTE — Telephone Encounter (Signed)
Patient needs trilyte 

## 2018-03-25 NOTE — Progress Notes (Addendum)
   Subjective:    Patient ID: Jonathan Benitez, male    DOB: 06/22/1950, 67 y.o.   MRN: [id number]  HPI Referred by the [facility] for [place].  No H and H was sent from the [place]. Per records his last colonoscopy was in 2010: Hemorrhoids. Per records, his CBC was normal in June before his hip replacement. Hip replaced 10/2017 Left) He says he saw blood in his stool 2 days ago. Does not occur often. Says color of stools depends on what he eats 02/10/2009 Small bowel biopsy: IDA.  No pathological diagnosis. Appetite is good. No weight loss. No abdominal pain.  No family hx of colon cancer. Patient has hx of Hepatitis C. Has not been treated.  ASA 325mg  daily  12/28/2016 Genotype 1A    Review of Systems Past Medical History:  Diagnosis Date  . GERD (gastroesophageal reflux disease)   . Hypertension   . Prostate CA [facility])     Past Surgical History:  Procedure Laterality Date  . [place]  . PROSTATE BIOPSY     positive for cancer  . TOTAL HIP ARTHROPLASTY Left    [facility] 11/07/17 Dr. Florian Buff    No Known Allergies  Current Outpatient Medications on File Prior to Visit  Medication Sig Dispense Refill  . acetaminophen (TYLENOL) 325 MG tablet Take 975 mg by mouth every 8 (eight) hours as needed.    Marland Kitchen aspirin 325 MG EC tablet Take 325 mg by mouth 2 (two) times daily.    . cetirizine (ZYRTEC) 10 MG tablet Take 10 mg by mouth 2 (two) times daily.    . cyclobenzaprine (FLEXERIL) 10 MG tablet Take 1 tablet (10 mg total) by mouth 3 (three) times daily as needed. 21 tablet 0  . fluticasone (FLONASE) 50 MCG/ACT nasal spray Place 2 sprays into both nostrils daily.    Marland Kitchen gabapentin (NEURONTIN) 300 MG capsule Take 300 mg by mouth every 8 (eight) hours.    Marland Kitchen lisinopril-hydrochlorothiazide (PRINZIDE,ZESTORETIC) 10-12.5 MG tablet Take 1 tablet by mouth daily.    . montelukast (SINGULAIR) 10 MG tablet Take 10 mg by mouth at bedtime.    . Multiple Vitamin (MULTIVITAMIN  WITH MINERALS) TABS tablet Take 1 tablet by mouth daily.    Marland Kitchen omeprazole (PRILOSEC) 20 MG capsule Take 20 mg by mouth 2 (two) times daily before a meal.     . potassium chloride SA (K-DUR,KLOR-CON) 20 MEQ tablet Take 20 mEq by mouth as needed.     . traMADol (ULTRAM) 50 MG tablet Take by mouth every 6 (six) hours as needed.     No current facility-administered medications on file prior to visit.         Objective:   Physical Exam Blood pressure (!) 166/80, pulse 64, temperature 98.2 F (36.8 C), height 6' (1.829 m), weight 187 lb 3.2 oz (84.9 kg).  Alert and oriented. Skin warm and dry. Oral mucosa is moist.   . Sclera anicteric, conjunctivae is pink. Thyroid not enlarged. No cervical lymphadenopathy. Lungs clear. Heart regular rate and rhythm.  Abdomen is soft. Bowel sounds are positive. No hepatomegaly. No abdominal masses felt. No tenderness.  No edema to lower extremities.   Rectal: Rectal mass felt. Grossly guaiac positive.       Assessment & Plan:  ? IDA. Iron studies. CBC Hepatitis C: HIV, Acute hepatitis panel, cmet, afp,  ? Rectal mass: guaiac positive. Colonic neoplasm needs to be ruled out. Colonoscopy

## 2018-03-26 ENCOUNTER — Encounter (INDEPENDENT_AMBULATORY_CARE_PROVIDER_SITE_OTHER): Payer: Self-pay | Admitting: *Deleted

## 2018-03-30 ENCOUNTER — Other Ambulatory Visit: Payer: Self-pay

## 2018-03-30 ENCOUNTER — Encounter (HOSPITAL_COMMUNITY)
Admission: RE | Disposition: A | Discharge status: Home / Self Care | Payer: Self-pay | Source: Ambulatory Visit | Attending: Internal Medicine

## 2018-03-30 ENCOUNTER — Ambulatory Visit (HOSPITAL_COMMUNITY)
Admission: RE | Admit: 2018-03-30 | Discharge: 2018-03-30 | Disposition: A | Discharge status: Home / Self Care | Payer: No Typology Code available for payment source | Source: Ambulatory Visit | Attending: Internal Medicine | Admitting: Internal Medicine

## 2018-03-30 ENCOUNTER — Encounter (HOSPITAL_COMMUNITY): Payer: Self-pay

## 2018-03-30 ENCOUNTER — Telehealth (INDEPENDENT_AMBULATORY_CARE_PROVIDER_SITE_OTHER): Payer: Self-pay | Admitting: Internal Medicine

## 2018-03-30 DIAGNOSIS — I10 Essential (primary) hypertension: Secondary | ICD-10-CM | POA: Diagnosis not present

## 2018-03-30 DIAGNOSIS — D649 Anemia, unspecified: Secondary | ICD-10-CM | POA: Insufficient documentation

## 2018-03-30 DIAGNOSIS — D122 Benign neoplasm of ascending colon: Secondary | ICD-10-CM | POA: Insufficient documentation

## 2018-03-30 DIAGNOSIS — K625 Hemorrhage of anus and rectum: Secondary | ICD-10-CM | POA: Insufficient documentation

## 2018-03-30 DIAGNOSIS — K6289 Other specified diseases of anus and rectum: Secondary | ICD-10-CM | POA: Insufficient documentation

## 2018-03-30 DIAGNOSIS — Z7951 Long term (current) use of inhaled steroids: Secondary | ICD-10-CM | POA: Diagnosis not present

## 2018-03-30 DIAGNOSIS — K644 Residual hemorrhoidal skin tags: Secondary | ICD-10-CM | POA: Insufficient documentation

## 2018-03-30 DIAGNOSIS — K648 Other hemorrhoids: Secondary | ICD-10-CM | POA: Insufficient documentation

## 2018-03-30 DIAGNOSIS — Z8546 Personal history of malignant neoplasm of prostate: Secondary | ICD-10-CM | POA: Diagnosis not present

## 2018-03-30 DIAGNOSIS — Z79899 Other long term (current) drug therapy: Secondary | ICD-10-CM | POA: Diagnosis not present

## 2018-03-30 DIAGNOSIS — Z96642 Presence of left artificial hip joint: Secondary | ICD-10-CM | POA: Insufficient documentation

## 2018-03-30 DIAGNOSIS — K219 Gastro-esophageal reflux disease without esophagitis: Secondary | ICD-10-CM | POA: Insufficient documentation

## 2018-03-30 DIAGNOSIS — D508 Other iron deficiency anemias: Secondary | ICD-10-CM

## 2018-03-30 DIAGNOSIS — K5730 Diverticulosis of large intestine without perforation or abscess without bleeding: Secondary | ICD-10-CM | POA: Insufficient documentation

## 2018-03-30 DIAGNOSIS — B1710 Acute hepatitis C without hepatic coma: Secondary | ICD-10-CM

## 2018-03-30 HISTORY — PX: POLYPECTOMY: SHX5525

## 2018-03-30 HISTORY — PX: COLONOSCOPY: SHX5424

## 2018-03-30 LAB — CBC WITH DIFFERENTIAL/PLATELET
Basophils Absolute: 42 cells/uL (ref 0–200)
Basophils Relative: 0.5 %
EOS ABS: 50 {cells}/uL (ref 15–500)
Eosinophils Relative: 0.6 %
HCT: 38.2 % — ABNORMAL LOW (ref 38.5–50.0)
Hemoglobin: 12.9 g/dL — ABNORMAL LOW (ref 13.2–17.1)
Lymphs Abs: 1982 cells/uL (ref 850–3900)
MCH: 28.9 pg (ref 27.0–33.0)
MCHC: 33.8 g/dL (ref 32.0–36.0)
MCV: 85.5 fL (ref 80.0–100.0)
MPV: 12.8 fL — ABNORMAL HIGH (ref 7.5–12.5)
Monocytes Relative: 17.1 %
NEUTROS PCT: 58.2 %
Neutro Abs: 4889 cells/uL (ref 1500–7800)
PLATELETS: 275 10*3/uL (ref 140–400)
RBC: 4.47 10*6/uL (ref 4.20–5.80)
RDW: 17.9 % — AB (ref 11.0–15.0)
TOTAL LYMPHOCYTE: 23.6 %
WBC: 8.4 10*3/uL (ref 3.8–10.8)
WBCMIX: 1436 {cells}/uL — AB (ref 200–950)

## 2018-03-30 LAB — HEPATITIS PANEL, ACUTE
Hep A IgM: NONREACTIVE
Hep B C IgM: NONREACTIVE
Hepatitis B Surface Ag: NONREACTIVE
Hepatitis C Ab: REACTIVE — AB
SIGNAL TO CUT-OFF: 33 — AB (ref <1.00)

## 2018-03-30 LAB — HIV ANTIBODY (ROUTINE TESTING W REFLEX): HIV: NONREACTIVE

## 2018-03-30 LAB — IRON,TIBC AND FERRITIN PANEL
%SAT: 18 % (calc) — ABNORMAL LOW (ref 20–48)
Ferritin: 39 ng/mL (ref 24–380)
Iron: 89 ug/dL (ref 50–180)
TIBC: 503 mcg/dL (calc) — ABNORMAL HIGH (ref 250–425)

## 2018-03-30 LAB — COMPREHENSIVE METABOLIC PANEL
AG RATIO: 1 (calc) (ref 1.0–2.5)
ALKALINE PHOSPHATASE (APISO): 73 U/L (ref 40–115)
ALT: 91 U/L — ABNORMAL HIGH (ref 9–46)
AST: 60 U/L — ABNORMAL HIGH (ref 10–35)
Albumin: 4.2 g/dL (ref 3.6–5.1)
BILIRUBIN TOTAL: 0.3 mg/dL (ref 0.2–1.2)
BUN: 19 mg/dL (ref 7–25)
CALCIUM: 10.1 mg/dL (ref 8.6–10.3)
CHLORIDE: 103 mmol/L (ref 98–110)
CO2: 22 mmol/L (ref 20–32)
Creat: 0.88 mg/dL (ref 0.70–1.25)
GLOBULIN: 4.4 g/dL — AB (ref 1.9–3.7)
Glucose, Bld: 91 mg/dL (ref 65–139)
POTASSIUM: 3.6 mmol/L (ref 3.5–5.3)
Sodium: 135 mmol/L (ref 135–146)
Total Protein: 8.6 g/dL — ABNORMAL HIGH (ref 6.1–8.1)

## 2018-03-30 LAB — HCV RNA,QUANTITATIVE REAL TIME PCR
HCV QUANT LOG: 6.82 {Log_IU}/mL — AB
HCV RNA, PCR, QN: 6590000 [IU]/mL — AB

## 2018-03-30 LAB — AFP TUMOR MARKER: AFP TUMOR MARKER: 5.7 ng/mL (ref <6.1)

## 2018-03-30 SURGERY — COLONOSCOPY
Anesthesia: Moderate Sedation

## 2018-03-30 MED ORDER — MIDAZOLAM HCL 5 MG/5ML IJ SOLN
INTRAMUSCULAR | Status: AC
  Filled 2018-03-30: qty 10

## 2018-03-30 MED ORDER — STERILE WATER FOR IRRIGATION IR SOLN
Status: DC | PRN
  Administered 2018-03-30: 1.5 mL

## 2018-03-30 MED ORDER — MIDAZOLAM HCL 5 MG/5ML IJ SOLN
INTRAMUSCULAR | Status: DC | PRN
  Administered 2018-03-30: 2 mg via INTRAVENOUS
  Administered 2018-03-30: 1 mg via INTRAVENOUS
  Administered 2018-03-30: 2 mg via INTRAVENOUS
  Administered 2018-03-30: 1 mg via INTRAVENOUS
  Administered 2018-03-30 (×2): 2 mg via INTRAVENOUS

## 2018-03-30 MED ORDER — BENEFIBER DRINK MIX PO PACK: 4.0000 g | PACK | Freq: Every day | ORAL | Status: DC

## 2018-03-30 MED ORDER — SODIUM CHLORIDE 0.9 % IV SOLN
INTRAVENOUS | Status: DC
  Administered 2018-03-30: 07:00:00 via INTRAVENOUS

## 2018-03-30 MED ORDER — MEPERIDINE HCL 50 MG/ML IJ SOLN
INTRAMUSCULAR | Status: AC
  Filled 2018-03-30: qty 1

## 2018-03-30 MED ORDER — MEPERIDINE HCL 50 MG/ML IJ SOLN
INTRAMUSCULAR | Status: DC | PRN
  Administered 2018-03-30 (×2): 25 mg

## 2018-03-30 NOTE — Op Note (Addendum)
[facility] Patient Name: Jonathan Benitez Procedure Date: 03/30/2018 7:03 AM MRN: [id number] Date of Birth: 06-24-1950 Attending MD: Hildred Laser , MD CSN: [id number] Age: 67 Admit Type: Outpatient Procedure:                Colonoscopy Indications:              Rectal bleeding, Abnormal rectal exam Providers:                Hildred Laser, MD, Charlsie Quest. Theda Sers RN, RN, Aram Candela Referring MD:             [place], [place], [place] Medicines:                Meperidine 50 mg IV, Midazolam 10 mg IV Complications:            No immediate complications. Estimated Blood Loss:     Estimated blood loss was minimal. Procedure:                Pre-Anesthesia Assessment:                           - Prior to the procedure, a History and Physical                            was performed, and patient medications and                            allergies were reviewed. The patient's tolerance of                            previous anesthesia was also reviewed. The risks                            and benefits of the procedure and the sedation                            options and risks were discussed with the patient.                            All questions were answered, and informed consent                            was obtained. Prior Anticoagulants: The patient has                            taken no previous anticoagulant or antiplatelet                            agents. ASA Grade Assessment: II - A patient with                            mild systemic disease. After reviewing the risks  and benefits, the patient was deemed in                            satisfactory condition to undergo the procedure.                           After obtaining informed consent, the colonoscope                            was passed under direct vision. Throughout the                            procedure, the patient's blood pressure, pulse, and                             oxygen saturations were monitored continuously. The                            [id number]-[id number] ([id number]) scope was introduced through                            the anus and advanced to the the cecum, identified                            by appendiceal orifice and ileocecal valve. The                            colonoscopy was performed without difficulty. The                            patient tolerated the procedure well. The quality                            of the bowel preparation was good. The ileocecal                            valve, appendiceal orifice, and rectum were                            photographed. Scope In: 7:42:37 AM Scope Out: 8:05:35 AM Scope Withdrawal Time: 0 hours 18 minutes 22 seconds  Total Procedure Duration: 0 hours 22 minutes 58 seconds  Findings:      Skin tags were found on perianal exam.      The digital rectal exam was normal. No mass noted.      A 6 mm polyp was found in the proximal ascending colon. The polyp was       sessile. The polyp was removed with a cold snare. Resection and       retrieval were complete.      A few medium-mouthed diverticula were found in the sigmoid colon and       transverse colon.      External and internal hemorrhoids were found during retroflexion. The       hemorrhoids were medium-sized. Impression:               -  Perianal skin tags found on perianal exam.                           - One 6 mm polyp in the proximal ascending colon,                            removed with a cold snare. Resected and retrieved.                           - Diverticulosis in the sigmoid colon and in the                            transverse colon.                           - External and internal hemorrhoids. Moderate Sedation:      Moderate (conscious) sedation was administered by the endoscopy nurse       and supervised by the endoscopist. The following parameters were       monitored: oxygen saturation, heart  rate, blood pressure, CO2       capnography and response to care. Total physician intraservice time was       30 minutes. Recommendation:           - Patient has a contact number available for                            emergencies. The signs and symptoms of potential                            delayed complications were discussed with the                            patient. Return to normal activities tomorrow.                            Written discharge instructions were provided to the                            patient.                           - High fiber diet today.                           - Continue present medications.                           - Use Benefiber 4 g PO daily.                           - No aspirin, ibuprofen, naproxen, or other                            non-steroidal anti-inflammatory drugs for 1 day.                           -  Await pathology results. Procedure Code(s):        --- Professional ---                           [phone], Colonoscopy, flexible; with removal of                            tumor(s), polyp(s), or other lesion(s) by snare                            technique                           99153, Moderate sedation; each additional 15                            minutes intraservice time                           G0500, Moderate sedation services provided by the                            same physician or other qualified health care                            professional performing a gastrointestinal                            endoscopic service that sedation supports,                            requiring the presence of an independent trained                            observer to assist in the monitoring of the                            patient's level of consciousness and physiological                            status; initial 15 minutes of intra-service time;                            patient age 69 years or older (additional time may                             be reported with [phone], as appropriate) Diagnosis Code(s):        --- Professional ---                           D12.2, Benign neoplasm of ascending colon                           K64.8, Other hemorrhoids  K64.4, Residual hemorrhoidal skin tags                           K62.5, Hemorrhage of anus and rectum                           K62.89, Other specified diseases of anus and rectum                           K57.30, Diverticulosis of large intestine without                            perforation or abscess without bleeding CPT copyright 2018 [employer]. All rights reserved. The codes documented in this report are preliminary and upon coder review may  be revised to meet current compliance requirements. Hildred Laser, MD Hildred Laser, MD 03/30/2018 8:21:18 AM This report has been signed electronically. Number of Addenda: 0

## 2018-03-30 NOTE — Telephone Encounter (Signed)
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 Hepatitis C

## 2018-03-30 NOTE — Discharge Instructions (Signed)
No aspirin or NSAIDs for 24 hours. Resume other medications as before. High-fiber diet. Benefiber 4 g by mouth daily at bedtime. No driving for 24 hours. Physician will call with biopsy results.   Colonoscopy, Adult, Care After This sheet gives you information about how to care for yourself after your procedure. Your doctor may also give you more specific instructions. If you have problems or questions, call your doctor. Follow these instructions at home: General instructions   For the first 24 hours after the procedure: ? Do not drive or use machinery. ? Do not sign important documents. ? Do not drink alcohol. ? Do your daily activities more slowly than normal. ? Eat foods that are soft and easy to digest. ? Rest often.  Take over-the-counter or prescription medicines only as told by your doctor.  It is up to you to get the results of your procedure. Ask your doctor, or the department performing the procedure, when your results will be ready. To help cramping and bloating:  Try walking around.  Put heat on your belly (abdomen) as told by your doctor. Use a heat source that your doctor recommends, such as a moist heat pack or a heating pad. ? Put a towel between your skin and the heat source. ? Leave the heat on for 20-30 minutes. ? Remove the heat if your skin turns bright red. This is especially important if you cannot feel pain, heat, or cold. You can get burned. Eating and drinking  Drink enough fluid to keep your pee (urine) clear or pale yellow.  Return to your normal diet as told by your doctor. Avoid heavy or fried foods that are hard to digest.  Avoid drinking alcohol for as long as told by your doctor. Contact a doctor if:  You have blood in your poop (stool) 2-3 days after the procedure. Get help right away if:  You have more than a small amount of blood in your poop.  You see large clumps of tissue (blood clots) in your poop.  Your belly is swollen.  You  feel sick to your stomach (nauseous).  You throw up (vomit).  You have a fever.  You have belly pain that gets worse, and medicine does not help your pain. This information is not intended to replace advice given to you by your health care provider. Make sure you discuss any questions you have with your health care provider. Document Released: 06/01/2010 Document Revised: 01/22/2016 Document Reviewed: 01/22/2016 Elsevier Interactive Patient Education  2017 [place].  Colon Polyps Polyps are tissue growths inside the body. Polyps can grow in many places, including the large intestine (colon). A polyp may be a round bump or a mushroom-shaped growth. You could have one polyp or several. Most colon polyps are noncancerous (benign). However, some colon polyps can become cancerous over time. What are the causes? The exact cause of colon polyps is not known. What increases the risk? This condition is more likely to develop in people who:  Have a family history of colon cancer or colon polyps.  Are older than 63 or older than 45 if they are African American.  Have inflammatory bowel disease, such as ulcerative colitis or Crohn disease.  Are overweight.  Smoke cigarettes.  Do not get enough exercise.  Drink too much alcohol.  Eat a diet that is: ? High in fat and red meat. ? Low in fiber.  Had childhood cancer that was treated with abdominal radiation.  What are the signs  or symptoms? Most polyps do not cause symptoms. If you have symptoms, they may include:  Blood coming from your rectum when having a bowel movement.  Blood in your stool.The stool may look dark red or black.  A change in bowel habits, such as constipation or diarrhea.  How is this diagnosed? This condition is diagnosed with a colonoscopy. This is a procedure that uses a lighted, flexible scope to look at the inside of your colon. How is this treated? Treatment for this condition involves removing any  polyps that are found. Those polyps will then be tested for cancer. If cancer is found, your health care provider will talk to you about options for colon cancer treatment. Follow these instructions at home: Diet  Eat plenty of fiber, such as fruits, vegetables, and whole grains.  Eat foods that are high in calcium and vitamin D, such as milk, cheese, yogurt, eggs, liver, fish, and broccoli.  Limit foods high in fat, red meats, and processed meats, such as hot dogs, sausage, bacon, and lunch meats.  Maintain a healthy weight, or lose weight if recommended by your health care provider. General instructions  Do not smoke cigarettes.  Do not drink alcohol excessively.  Keep all follow-up visits as told by your health care provider. This is important. This includes keeping regularly scheduled colonoscopies. Talk to your health care provider about when you need a colonoscopy.  Exercise every day or as told by your health care provider. Contact a health care provider if:  You have new or worsening bleeding during a bowel movement.  You have new or increased blood in your stool.  You have a change in bowel habits.  You unexpectedly lose weight. This information is not intended to replace advice given to you by your health care provider. Make sure you discuss any questions you have with your health care provider. Document Released: 01/24/2004 Document Revised: 10/05/2015 Document Reviewed: 03/20/2015 Elsevier Interactive Patient Education  [employer].  Diverticulosis Diverticulosis is a condition that develops when small pouches (diverticula) form in the wall of the large intestine (colon). The colon is where water is absorbed and stool is formed. The pouches form when the inside layer of the colon pushes through weak spots in the outer layers of the colon. You may have a few pouches or many of them. What are the causes? The cause of this condition is not known. What increases  the risk? The following factors may make you more likely to develop this condition:  Being older than age 45. Your risk for this condition increases with age. Diverticulosis is rare among people younger than age 42. By age 46, many people have it.  Eating a low-fiber diet.  Having frequent constipation.  Being overweight.  Not getting enough exercise.  Smoking.  Taking over-the-counter pain medicines, like aspirin and ibuprofen.  Having a family history of diverticulosis.  What are the signs or symptoms? In most people, there are no symptoms of this condition. If you do have symptoms, they may include:  Bloating.  Cramps in the abdomen.  Constipation or diarrhea.  Pain in the lower left side of the abdomen.  How is this diagnosed? This condition is most often diagnosed during an exam for other colon problems. Because diverticulosis usually has no symptoms, it often cannot be diagnosed independently. This condition may be diagnosed by:  Using a flexible scope to examine the colon (colonoscopy).  Taking an X-ray of the colon after dye has been put  into the colon (barium enema).  Doing a CT scan.  How is this treated? You may not need treatment for this condition if you have never developed an infection related to diverticulosis. If you have had an infection before, treatment may include:  Eating a high-fiber diet. This may include eating more fruits, vegetables, and grains.  Taking a fiber supplement.  Taking a live bacteria supplement (probiotic).  Taking medicine to relax your colon.  Taking antibiotic medicines.  Follow these instructions at home:  Drink 6-8 glasses of water or more each day to prevent constipation.  Try not to strain when you have a bowel movement.  If you have had an infection before: ? Eat more fiber as directed by your health care provider or your diet and nutrition specialist (dietitian). ? Take a fiber supplement or probiotic, if  your health care provider approves.  Take over-the-counter and prescription medicines only as told by your health care provider.  If you were prescribed an antibiotic, take it as told by your health care provider. Do not stop taking the antibiotic even if you start to feel better.  Keep all follow-up visits as told by your health care provider. This is important. Contact a health care provider if:  You have pain in your abdomen.  You have bloating.  You have cramps.  You have not had a bowel movement in 3 days. Get help right away if:  Your pain gets worse.  Your bloating becomes very bad.  You have a fever or chills, and your symptoms suddenly get worse.  You vomit.  You have bowel movements that are bloody or black.  You have bleeding from your rectum. Summary  Diverticulosis is a condition that develops when small pouches (diverticula) form in the wall of the large intestine (colon).  You may have a few pouches or many of them.  This condition is most often diagnosed during an exam for other colon problems.  If you have had an infection related to diverticulosis, treatment may include increasing the fiber in your diet, taking supplements, or taking medicines. This information is not intended to replace advice given to you by your health care provider. Make sure you discuss any questions you have with your health care provider. Document Released: 01/25/2004 Document Revised: 03/18/2016 Document Reviewed: 03/18/2016 Elsevier Interactive Patient Education  2017 [place].  Hemorrhoids Hemorrhoids are swollen veins in and around the rectum or anus. There are two types of hemorrhoids:  Internal hemorrhoids. These occur in the veins that are just inside the rectum. They may poke through to the outside and become irritated and painful.  External hemorrhoids. These occur in the veins that are outside of the anus and can be felt as a painful swelling or hard lump near the  anus.  Most hemorrhoids do not cause serious problems, and they can be managed with home treatments such as diet and lifestyle changes. If home treatments do not help your symptoms, procedures can be done to shrink or remove the hemorrhoids. What are the causes? This condition is caused by increased pressure in the anal area. This pressure may result from various things, including:  Constipation.  Straining to have a bowel movement.  Diarrhea.  Pregnancy.  Obesity.  Sitting for long periods of time.  Heavy lifting or other activity that causes you to strain.  Anal sex.  What are the signs or symptoms? Symptoms of this condition include:  Pain.  Anal itching or irritation.  Rectal bleeding.  Leakage of stool (feces).  Anal swelling.  One or more lumps around the anus.  How is this diagnosed? This condition can often be diagnosed through a visual exam. Other exams or tests may also be done, such as:  Examination of the rectal area with a gloved hand (digital rectal exam).  Examination of the anal canal using a small tube (anoscope).  A blood test, if you have lost a significant amount of blood.  A test to look inside the colon (sigmoidoscopy or colonoscopy).  How is this treated? This condition can usually be treated at home. However, various procedures may be done if dietary changes, lifestyle changes, and other home treatments do not help your symptoms. These procedures can help make the hemorrhoids smaller or remove them completely. Some of these procedures involve surgery, and others do not. Common procedures include:  Rubber band ligation. Rubber bands are placed at the base of the hemorrhoids to cut off the blood supply to them.  Sclerotherapy. Medicine is injected into the hemorrhoids to shrink them.  Infrared coagulation. A type of light energy is used to get rid of the hemorrhoids.  Hemorrhoidectomy surgery. The hemorrhoids are surgically removed, and  the veins that supply them are tied off.  Stapled hemorrhoidopexy surgery. A circular stapling device is used to remove the hemorrhoids and use staples to cut off the blood supply to them.  Follow these instructions at home: Eating and drinking  Eat foods that have a lot of fiber in them, such as whole grains, beans, nuts, fruits, and vegetables. Ask your health care provider about taking products that have added fiber (fiber supplements).  Drink enough fluid to keep your urine clear or pale yellow. Managing pain and swelling  Take warm sitz baths for 20 minutes, 3-4 times a day to ease pain and discomfort.  If directed, apply ice to the affected area. Using ice packs between sitz baths may be helpful. ? Put ice in a plastic bag. ? Place a towel between your skin and the bag. ? Leave the ice on for 20 minutes, 2-3 times a day. General instructions  Take over-the-counter and prescription medicines only as told by your health care provider.  Use medicated creams or suppositories as told.  Exercise regularly.  Go to the bathroom when you have the urge to have a bowel movement. Do not wait.  Avoid straining to have bowel movements.  Keep the anal area dry and clean. Use wet toilet paper or moist towelettes after a bowel movement.  Do not sit on the toilet for long periods of time. This increases blood pooling and pain. Contact a health care provider if:  You have increasing pain and swelling that are not controlled by treatment or medicine.  You have uncontrolled bleeding.  You have difficulty having a bowel movement, or you are unable to have a bowel movement.  You have pain or inflammation outside the area of the hemorrhoids. This information is not intended to replace advice given to you by your health care provider. Make sure you discuss any questions you have with your health care provider. Document Released: 04/26/2000 Document Revised: 09/27/2015 Document Reviewed:  01/11/2015 Elsevier Interactive Patient Education  [employer].

## 2018-03-30 NOTE — H&P (Signed)
Jonathan Benitez is an 67 y.o. male.   Chief Complaint: Patient is here for colonoscopy. HPI: She is 67 year old F American male who presents with 1 year history of intermittent rectal bleeding.  Generally has been small amount of blood.  He denies abdominal pain change in bowel habits or weight loss.  Last colonoscopy was normal 10 years ago.  Patient was seen in office last week.  He was noted to have rectal mass.  His hemoglobin was 12.9 g.  He was noted to have elevated transaminases.  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  HCV RNA is pending. Family history is negative for CRC.  Past Medical History:  Diagnosis Date  . GERD (gastroesophageal reflux disease)   . Hepatitis C   . Hypertension   . Prostate CA [facility])     Past Surgical History:  Procedure Laterality Date  . [place]  . PROSTATE BIOPSY     positive for cancer  . TOTAL HIP ARTHROPLASTY Left    [facility] 11/07/17 Dr. Florian Buff    Family History  Problem Relation Age of Onset  . Hypertension Mother   . Hypertension Father   . Stroke Father    Social History:  reports that he has never smoked. He has never used smokeless tobacco. He reports that he drinks alcohol. He reports that he has current or past drug history. Drug: Marijuana.  Allergies: No Known Allergies  Medications Prior to Admission  Medication Sig Dispense Refill  . cetirizine (ZYRTEC) 10 MG tablet Take 10 mg by mouth 2 (two) times daily.    . fluticasone (FLONASE) 50 MCG/ACT nasal spray Place 2 sprays into both nostrils daily as needed for allergies.     . Menthol-Methyl Salicylate (THERA-GESIC EX) Apply 1 application topically daily as needed (for pain).    . Multiple Vitamin (MULTIVITAMIN WITH MINERALS) TABS tablet Take 1 tablet by mouth daily.    . naproxen sodium (ALEVE) 220 MG tablet Take 220 mg by mouth daily as needed (for pain or headache).    Marland Kitchen omeprazole (PRILOSEC) 20 MG capsule Take 20 mg by mouth 2 (two) times daily before a  meal.     . potassium chloride SA (K-DUR,KLOR-CON) 20 MEQ tablet Take 20 mEq by mouth daily as needed (for cramps).     . sildenafil (REVATIO) 20 MG tablet Take 20 mg by mouth daily as needed (for ED).    Marland Kitchen traMADol (ULTRAM) 50 MG tablet Take 50 mg by mouth 3 (three) times daily as needed for moderate pain.     . cyclobenzaprine (FLEXERIL) 10 MG tablet Take 1 tablet (10 mg total) by mouth 3 (three) times daily as needed. (Patient not taking: Reported on 03/26/2018) 21 tablet 0  . lisinopril-hydrochlorothiazide (PRINZIDE,ZESTORETIC) 20-25 MG tablet Take 1 tablet by mouth daily.       No results found for this or any previous visit (from the past 48 hour(s)). No results found.  ROS  Blood pressure (!) 156/81, pulse 80, temperature 98.2 F (36.8 C), temperature source Oral, resp. rate 16, SpO2 100 %. Physical Exam  Constitutional: He appears well-developed and well-nourished.  HENT:  Mouth/Throat: Oropharynx is clear and moist.  Eyes: Conjunctivae are normal. No scleral icterus.  Neck: No thyromegaly present.  Cardiovascular: Normal rate, regular rhythm and normal heart sounds.  No murmur heard. Respiratory: Effort normal and breath sounds normal.  GI:  Abdomen is flat.  A small infraumbilical scar noted along with scar in  right inguinal area.  Abdomen is soft and nontender with organomegaly or masses.  Musculoskeletal: He exhibits no edema.  Lymphadenopathy:    He has no cervical adenopathy.  Neurological: He is alert.  Skin: Skin is warm and dry.     Assessment/Plan Rectal bleeding. Abnormal rectal exam suggesting a mass. Diagnostic colonoscopy.  Hildred Laser, MD 03/30/2018, 7:30 AM

## 2018-03-31 NOTE — Telephone Encounter (Signed)
[place] sch'd 04/06/18 at [phone] ([phone]), patient aware

## 2018-04-03 ENCOUNTER — Encounter (HOSPITAL_COMMUNITY): Payer: Self-pay | Admitting: Internal Medicine

## 2018-04-06 ENCOUNTER — Ambulatory Visit (HOSPITAL_COMMUNITY): Payer: Medicare Other

## 2018-04-30 ENCOUNTER — Ambulatory Visit (HOSPITAL_COMMUNITY): Payer: Non-veteran care

## 2019-03-02 ENCOUNTER — Other Ambulatory Visit: Payer: Self-pay

## 2019-03-02 ENCOUNTER — Ambulatory Visit
Admission: EM | Admit: 2019-03-02 | Discharge: 2019-03-02 | Disposition: A | Discharge status: Home / Self Care | Payer: No Typology Code available for payment source

## 2019-03-02 DIAGNOSIS — H9201 Otalgia, right ear: Secondary | ICD-10-CM

## 2019-03-02 NOTE — Discharge Instructions (Signed)
Rest and drink plenty of fluids Right ear does not appear infected Take OTC ibuprofen and/ or tylenol as needed for pain control Follow up with PCP if symptoms persists Return here or go to the ER if you have any new or worsening symptoms fever, chills, nausea, vomiting, chest pain, shortness of breath, difficulty swallowing, trouble breathing, etc..Marland Kitchen

## 2019-03-02 NOTE — ED Triage Notes (Signed)
Pt presents with right sided ear and jaw pain for past 3 days

## 2019-03-02 NOTE — ED Provider Notes (Signed)
[place]   [id number] 03/02/19 Arrival Time: 1006  CC: EAR PAIN  SUBJECTIVE: History from: patient.  Jonathan Benitez is a 68 y.o. male who presents with right ear pain for the past 3 days.  Denies a precipitating event, such as swimming or wearing ear plugs.  Patient has NOT tried OTC medications.  Denies aggravating factors. Reports similar symptoms in the past that improved with antibiotics.  Complains of associated RT sided facial pain.  Denies fever, chills, fatigue, sinus pain, rhinorrhea, ear discharge, sore throat, SOB, wheezing, chest pain, nausea, changes in bowel or bladder habits.    ROS: As per HPI.  All other pertinent ROS negative.     Past Medical History:  Diagnosis Date  . GERD (gastroesophageal reflux disease)   . Hepatitis C   . Hypertension   . Prostate CA [facility])    Past Surgical History:  Procedure Laterality Date  . COLONOSCOPY N/A 03/30/2018   Procedure: COLONOSCOPY;  Surgeon: Rogene Houston, MD;  Location: [facility];  Service: Endoscopy;  Laterality: N/A;  12:45  . [place]  . POLYPECTOMY  03/30/2018   Procedure: POLYPECTOMY;  Surgeon: Rogene Houston, MD;  Location: [facility] ENDO SUITE;  Service: Endoscopy;;  colon  . PROSTATE BIOPSY     positive for cancer  . TOTAL HIP ARTHROPLASTY Left    [facility] 11/07/17 Dr. Florian Buff   No Known Allergies No current facility-administered medications on file prior to encounter.    Current Outpatient Medications on File Prior to Encounter  Medication Sig Dispense Refill  . cetirizine (ZYRTEC) 10 MG tablet Take 10 mg by mouth 2 (two) times daily.    . fluticasone (FLONASE) 50 MCG/ACT nasal spray Place 2 sprays into both nostrils daily as needed for allergies.     Marland Kitchen lisinopril-hydrochlorothiazide (PRINZIDE,ZESTORETIC) 20-25 MG tablet Take 1 tablet by mouth daily.     . traMADol (ULTRAM) 50 MG tablet Take 50 mg by mouth 3 (three) times daily as needed for moderate pain.     .  Menthol-Methyl Salicylate (THERA-GESIC EX) Apply 1 application topically daily as needed (for pain).    . Multiple Vitamin (MULTIVITAMIN WITH MINERALS) TABS tablet Take 1 tablet by mouth daily.    . naproxen sodium (ALEVE) 220 MG tablet Take 220 mg by mouth daily as needed (for pain or headache).    Marland Kitchen omeprazole (PRILOSEC) 20 MG capsule Take 20 mg by mouth 2 (two) times daily before a meal.     . sildenafil (REVATIO) 20 MG tablet Take 20 mg by mouth daily as needed (for ED).    . Wheat Dextrin (BENEFIBER DRINK MIX) PACK Take 4 g by mouth at bedtime.     Social History   Socioeconomic History  . Marital status: Widowed    Spouse name: Not on file  . Number of children: Not on file  . Years of education: Not on file  . Highest education level: Not on file  Occupational History  . Not on file  Social Needs  . Financial resource strain: Not on file  . Food insecurity    Worry: Not on file    Inability: Not on file  . Transportation needs    Medical: Not on file    Non-medical: Not on file  Tobacco Use  . Smoking status: Never Smoker  . Smokeless tobacco: Never Used  Substance and Sexual Activity  . Alcohol use: Yes    Comment:  2 beers a day  . Drug use: Yes    Types: Marijuana    Comment: smoked yesterday  . Sexual activity: Not on file  Lifestyle  . Physical activity    Days per week: Not on file    Minutes per session: Not on file  . Stress: Not on file  Relationships  . Social Herbalist on phone: Not on file    Gets together: Not on file    Attends religious service: Not on file    Active member of club or organization: Not on file    Attends meetings of clubs or organizations: Not on file    Relationship status: Not on file  . Intimate partner violence    Fear of current or ex partner: Not on file    Emotionally abused: Not on file    Physically abused: Not on file    Forced sexual activity: Not on file  Other Topics Concern  . Not on file  Social  History Narrative  . Not on file   Family History  Problem Relation Age of Onset  . Hypertension Mother   . Hypertension Father   . Stroke Father     OBJECTIVE:  Vitals:   03/02/19 1019  BP: 119/79  Pulse: 73  Resp: 20  Temp: 98.1 F (36.7 C)  SpO2: 98%    General appearance: alert; well-appearing HEENT: NCAT Ears: EACs clear, TMs pearly gray with visible cone of light, without erythema; Eyes: PERRL, EOMI grossly;  Nose: patent without rhinorrhea; Throat: oropharynx clear, tonsils not enlarged and not erythematous without white tonsillar exudates, uvula midline Neck: supple without LAD Lungs: unlabored respirations, symmetrical air entry; cough: absent; no respiratory distress Heart: regular rate and rhythm.   Skin: warm and dry Psychological: alert and cooperative; normal mood and affect   ASSESSMENT & PLAN:  1. Acute otalgia, right    Rest and drink plenty of fluids Right ear does not appear infected Take OTC ibuprofen and/ or tylenol as needed for pain control Follow up with PCP if symptoms persists Return here or go to the ER if you have any new or worsening symptoms fever, chills, nausea, vomiting, chest pain, shortness of breath, difficulty swallowing, trouble breathing, etc...  Reviewed expectations re: course of current medical issues. Questions answered. Outlined signs and symptoms indicating need for more acute intervention. Patient verbalized understanding. After Visit Summary given.         Lestine Box, PA-C 03/02/19 1037

## 2019-07-02 IMAGING — DX DG HIP (WITH OR WITHOUT PELVIS) 2-3V*L*
3 series · 3 of 3 positions shown · non-contrast
Comparison: None.

CLINICAL DATA: Worsening chronic left hip pain. Recent fall.
Initial encounter.

EXAM:
DG HIP (WITH OR WITHOUT PELVIS) 2-3V LEFT

[pelvis ap]
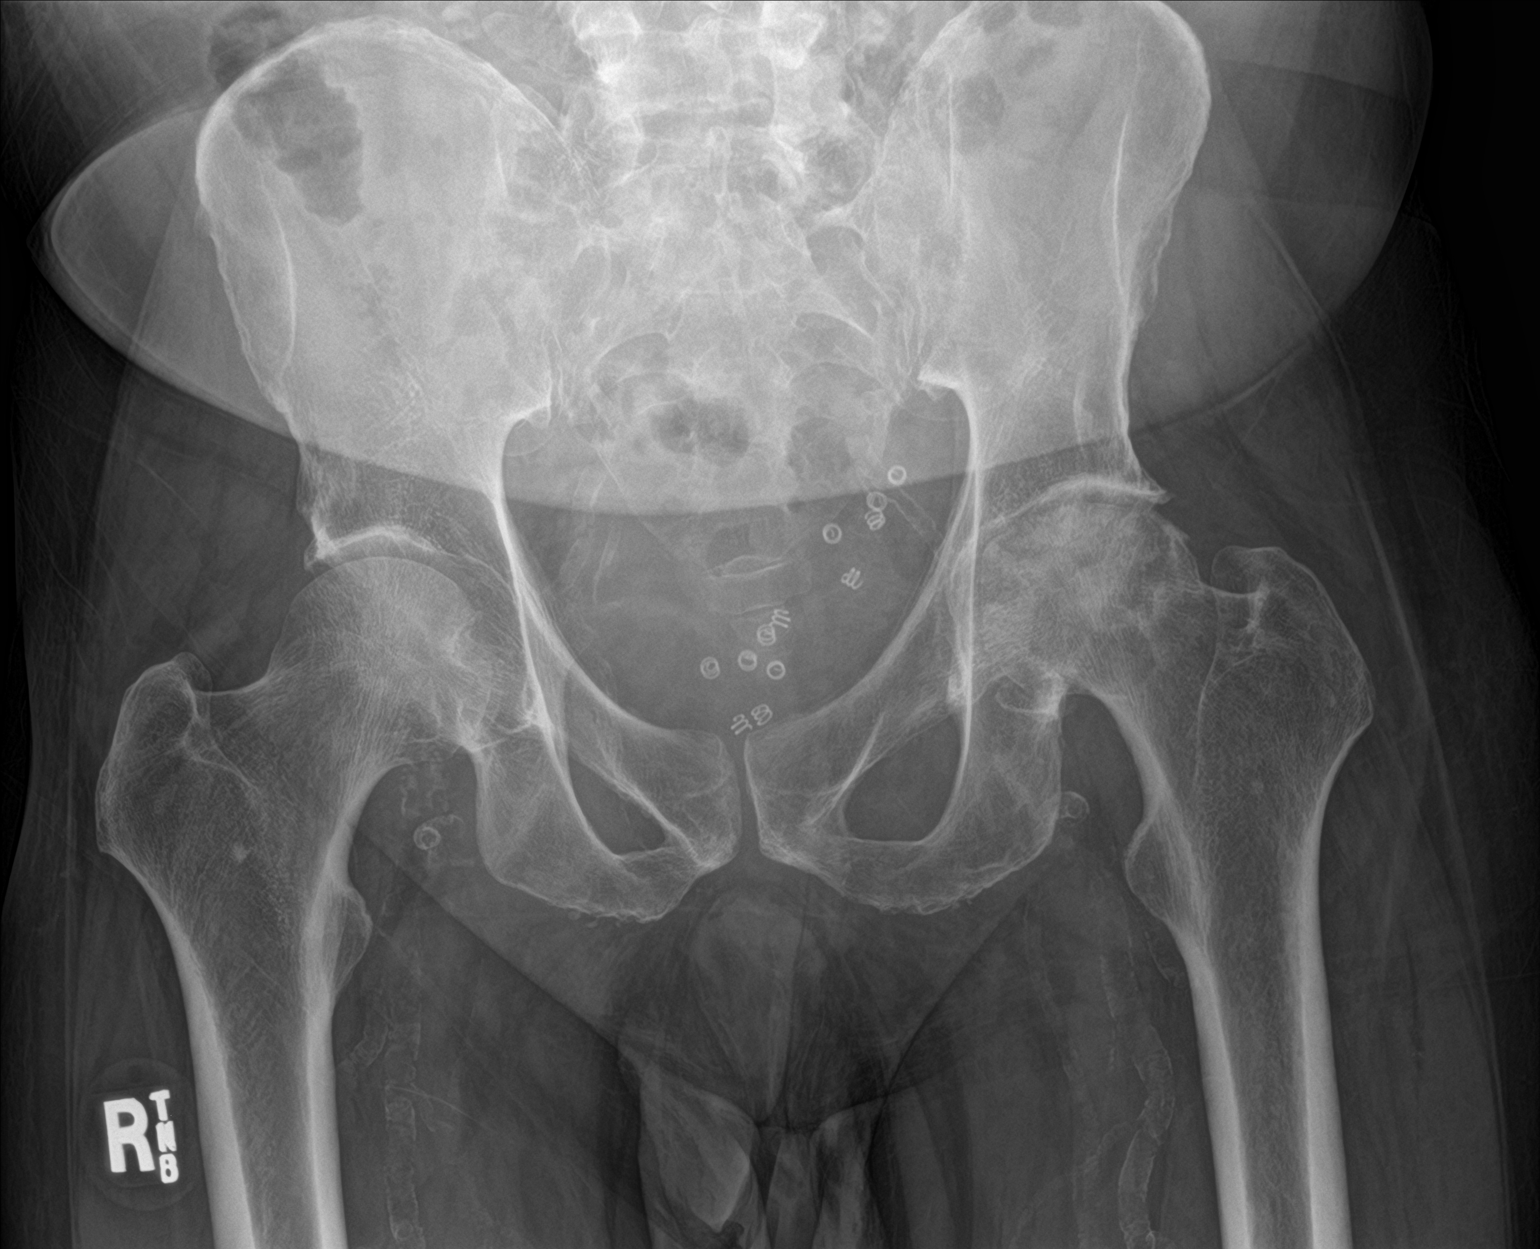

[hip ap]
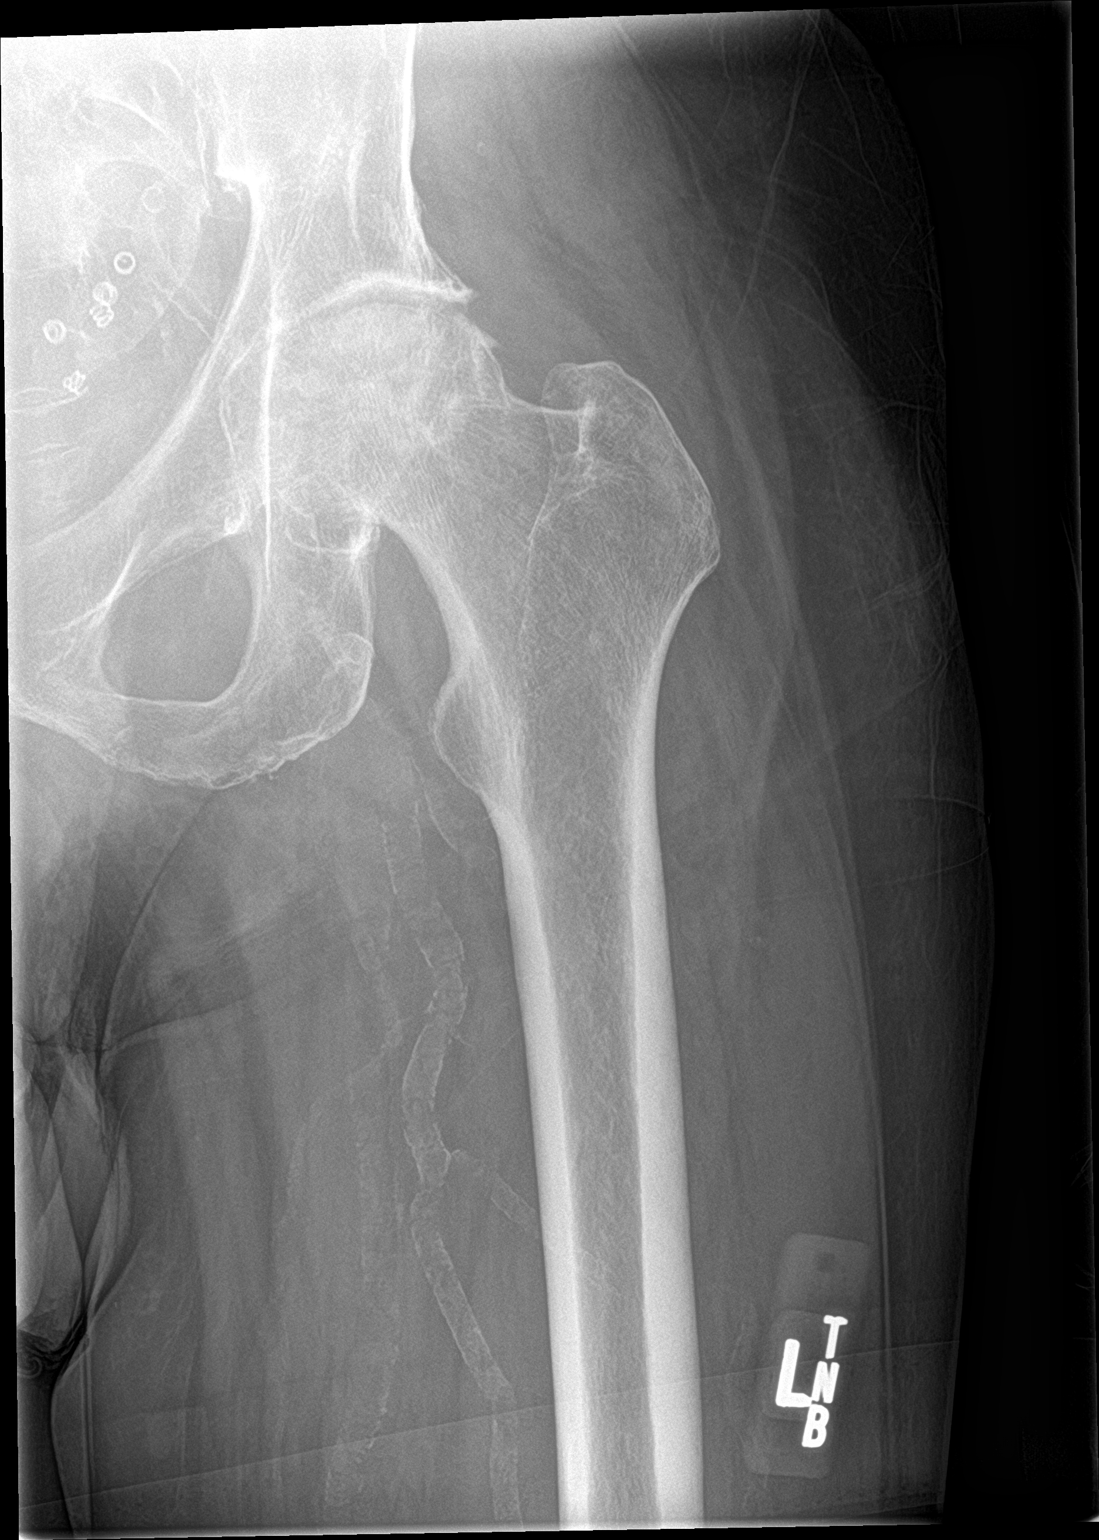

[hip lat]
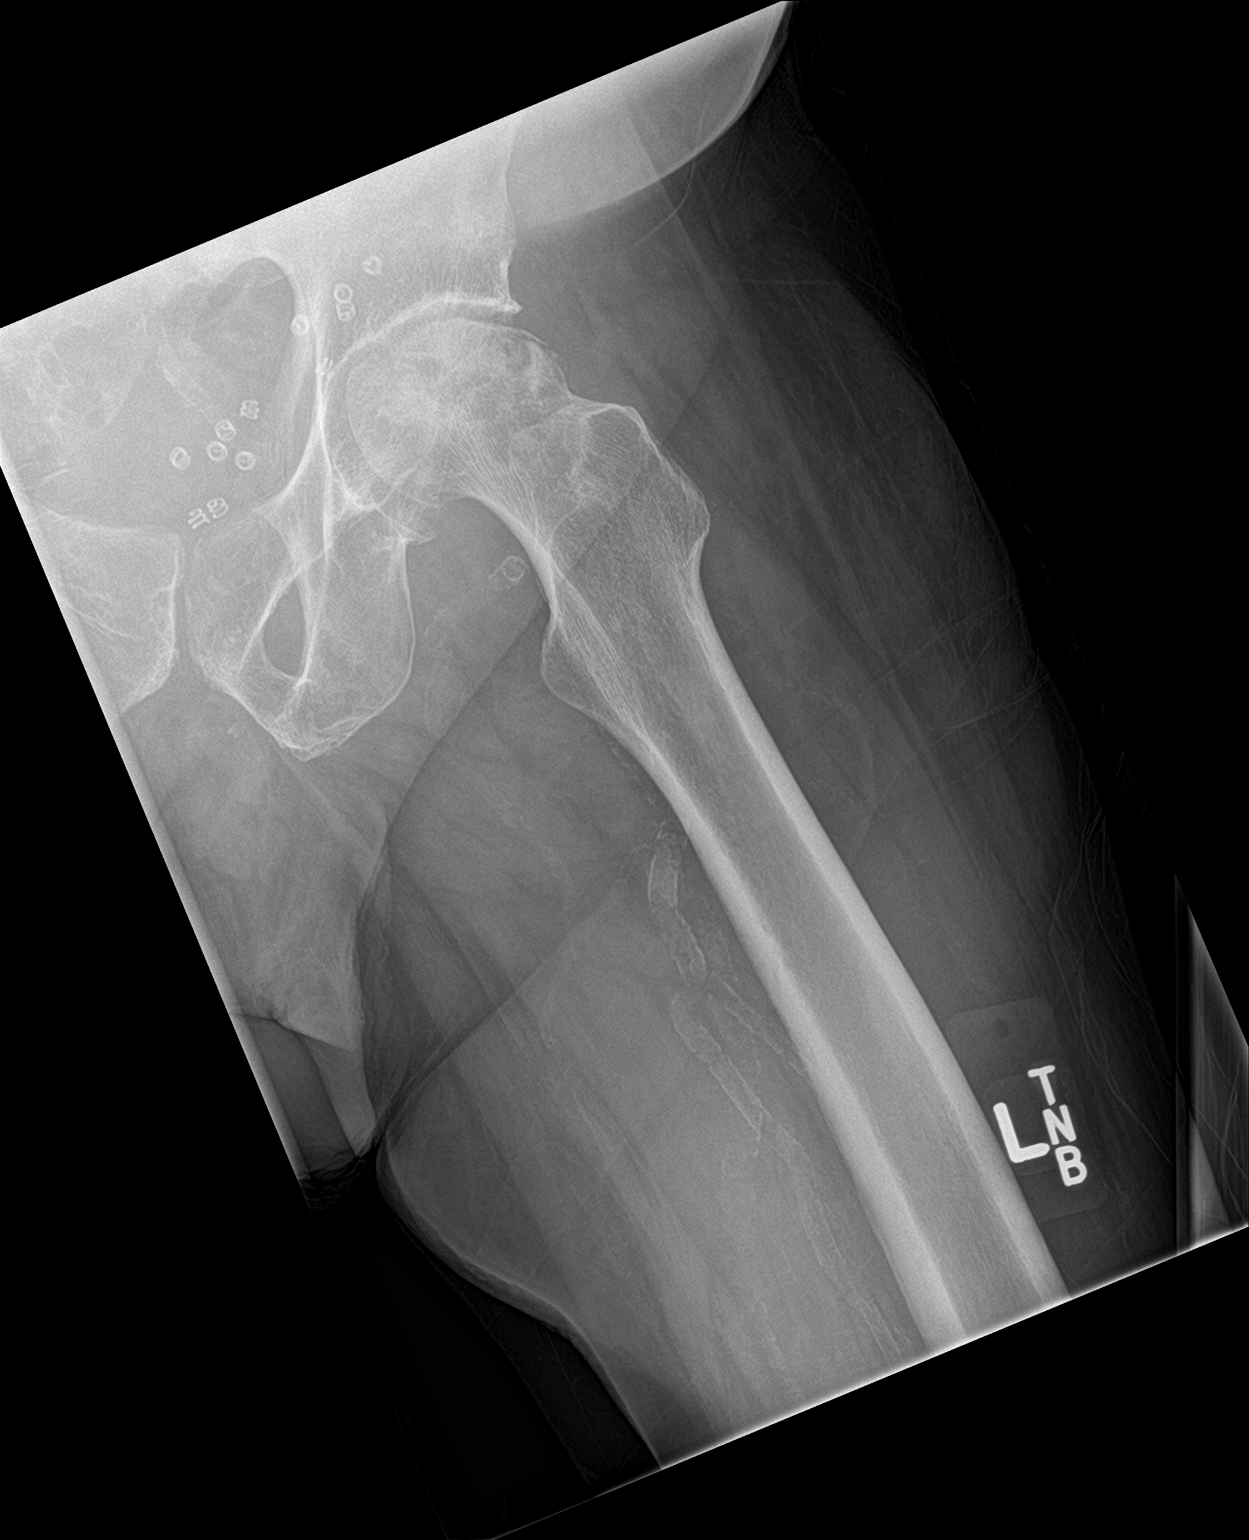

[3 of 3 positions shown; findings below may reference images not displayed]

FINDINGS: There is no evidence of acute fracture or dislocation. Changes of
avascular necrosis are noted at the left femoral head, with cortical
collapse and joint space narrowing.

The right hip joint is unremarkable in appearance. A mesh is noted
overlying the left hemipelvis. Scattered vascular calcifications are
seen. Degenerative change is noted at the lower lumbar spine. The
sacroiliac joints are grossly unremarkable.
IMPRESSION: 1. No evidence of acute fracture or dislocation.
2. Changes of avascular necrosis at the left femoral head, with
cortical collapse and joint space narrowing.
3. Scattered vascular calcifications seen.

## 2020-02-10 ENCOUNTER — Encounter (HOSPITAL_COMMUNITY): Payer: Self-pay | Admitting: Emergency Medicine

## 2020-02-10 ENCOUNTER — Other Ambulatory Visit: Payer: Self-pay

## 2020-02-10 ENCOUNTER — Emergency Department (HOSPITAL_COMMUNITY)
Admission: EM | Admit: 2020-02-10 | Discharge: 2020-02-10 | Disposition: A | Discharge status: Home / Self Care | Payer: No Typology Code available for payment source | Attending: Emergency Medicine | Admitting: Emergency Medicine

## 2020-02-10 DIAGNOSIS — R1013 Epigastric pain: Secondary | ICD-10-CM | POA: Diagnosis present

## 2020-02-10 DIAGNOSIS — Z96642 Presence of left artificial hip joint: Secondary | ICD-10-CM | POA: Insufficient documentation

## 2020-02-10 DIAGNOSIS — I10 Essential (primary) hypertension: Secondary | ICD-10-CM | POA: Diagnosis not present

## 2020-02-10 DIAGNOSIS — R110 Nausea: Secondary | ICD-10-CM

## 2020-02-10 DIAGNOSIS — R112 Nausea with vomiting, unspecified: Secondary | ICD-10-CM | POA: Diagnosis not present

## 2020-02-10 DIAGNOSIS — R531 Weakness: Secondary | ICD-10-CM | POA: Diagnosis not present

## 2020-02-10 DIAGNOSIS — Z8546 Personal history of malignant neoplasm of prostate: Secondary | ICD-10-CM | POA: Insufficient documentation

## 2020-02-10 LAB — CBC WITH DIFFERENTIAL/PLATELET
Abs Immature Granulocytes: 0.01 10*3/uL (ref 0.00–0.07)
Basophils Absolute: 0.1 10*3/uL (ref 0.0–0.1)
Basophils Relative: 1 %
Eosinophils Absolute: 0 10*3/uL (ref 0.0–0.5)
Eosinophils Relative: 0 %
HCT: 33.2 % — ABNORMAL LOW (ref 39.0–52.0)
Hemoglobin: 11.1 g/dL — ABNORMAL LOW (ref 13.0–17.0)
Immature Granulocytes: 0 %
Lymphocytes Relative: 33 %
Lymphs Abs: 1.6 10*3/uL (ref 0.7–4.0)
MCH: 31.9 pg (ref 26.0–34.0)
MCHC: 33.4 g/dL (ref 30.0–36.0)
MCV: 95.4 fL (ref 80.0–100.0)
Monocytes Absolute: 0.5 10*3/uL (ref 0.1–1.0)
Monocytes Relative: 10 %
Neutro Abs: 2.6 10*3/uL (ref 1.7–7.7)
Neutrophils Relative %: 56 %
Platelets: 276 10*3/uL (ref 150–400)
RBC: 3.48 MIL/uL — ABNORMAL LOW (ref 4.22–5.81)
RDW: 16.5 % — ABNORMAL HIGH (ref 11.5–15.5)
WBC: 4.8 10*3/uL (ref 4.0–10.5)
nRBC: 0 % (ref 0.0–0.2)

## 2020-02-10 LAB — COMPREHENSIVE METABOLIC PANEL
ALT: 21 U/L (ref 0–44)
AST: 54 U/L — ABNORMAL HIGH (ref 15–41)
Albumin: 3.9 g/dL (ref 3.5–5.0)
Alkaline Phosphatase: 52 U/L (ref 38–126)
Anion gap: 14 (ref 5–15)
BUN: 8 mg/dL (ref 8–23)
CO2: 22 mmol/L (ref 22–32)
Calcium: 8.7 mg/dL — ABNORMAL LOW (ref 8.9–10.3)
Chloride: 103 mmol/L (ref 98–111)
Creatinine, Ser: 0.76 mg/dL (ref 0.61–1.24)
GFR calc Af Amer: >60 mL/min (ref >60)
GFR calc non Af Amer: >60 mL/min (ref >60)
Glucose, Bld: 84 mg/dL (ref 70–99)
Potassium: 3.5 mmol/L (ref 3.5–5.1)
Sodium: 139 mmol/L (ref 135–145)
Total Bilirubin: 0.3 mg/dL (ref 0.3–1.2)
Total Protein: 8.3 g/dL — ABNORMAL HIGH (ref 6.5–8.1)

## 2020-02-10 LAB — LIPASE, BLOOD: Lipase: 29 U/L (ref 11–51)

## 2020-02-10 MED ORDER — ONDANSETRON 4 MG PO TBDP: 4.0000 mg | ORAL_TABLET | Freq: Three times a day (TID) | ORAL | 0 refills | Status: DC | PRN

## 2020-02-10 MED ORDER — SODIUM CHLORIDE 0.9 % IV BOLUS
1000.0000 mL | Freq: Once | INTRAVENOUS | Status: AC
  Administered 2020-02-10: 1000 mL via INTRAVENOUS

## 2020-02-10 MED ORDER — ALUM & MAG HYDROXIDE-SIMETH 400-400-40 MG/5ML PO SUSP: 10.0000 mL | Freq: Four times a day (QID) | ORAL | 0 refills | Status: DC | PRN

## 2020-02-10 MED ORDER — ONDANSETRON HCL 4 MG/2ML IJ SOLN
4.0000 mg | Freq: Once | INTRAMUSCULAR | Status: AC
  Administered 2020-02-10: 4 mg via INTRAVENOUS
  Filled 2020-02-10: qty 2

## 2020-02-10 MED ORDER — PANTOPRAZOLE SODIUM 40 MG IV SOLR
40.0000 mg | Freq: Once | INTRAVENOUS | Status: AC
  Administered 2020-02-10: 40 mg via INTRAVENOUS
  Filled 2020-02-10: qty 40

## 2020-02-10 MED ORDER — FAMOTIDINE 40 MG PO TABS: 40.0000 mg | ORAL_TABLET | Freq: Every day | ORAL | 0 refills | Status: DC

## 2020-02-10 MED ORDER — ALUM & MAG HYDROXIDE-SIMETH 200-200-20 MG/5ML PO SUSP
15.0000 mL | Freq: Once | ORAL | Status: AC
  Administered 2020-02-10: 15 mL via ORAL
  Filled 2020-02-10: qty 30

## 2020-02-10 NOTE — Discharge Instructions (Signed)
You were seen in the emergency room today with abdominal pain with nausea and poor appetite.  I have called in some additional medications to help with your symptoms.  You should return to the emergency department if you develop chest pain, shortness of breath, new/suddenly severe symptoms in your abdomen.  Please call your primary care doctor and gastroenterologist to schedule follow-up appointments and return to the emergency department any new or suddenly worsening symptoms.

## 2020-02-10 NOTE — ED Provider Notes (Signed)
Emergency Department Provider Note   I have reviewed the triage vital signs and the nursing notes.   HISTORY  Chief Complaint Abdominal Pain   HPI Strother JOHNMATTHEW SOLORIO is a 69 y.o. male with PMH of Hep C, HTN, and GERD presents to the emergency department with epigastric abdominal pain with nausea and vomiting.  Patient is followed primarily at the [place] but felt very weak and that he could not drive that far today.  He continues to have epigastric pain without radiation of symptoms.  He is having nausea vomiting along with very poor appetite.  He reports unintentional weight loss.  He has had CT scan of his chest, abdomen, pelvis in the past 60 days as well as upper and lower endoscopies at the [place].  He provides me with reports to review at the bedside.  He has been taking Bentyl along with omeprazole with no relief in symptoms.  He does not take medication for nausea and vomiting.  He has not experienced any new or suddenly worsening symptoms but feels like he is not getting enough calories and is dehydrated which prompted his ED visit.   Past Medical History:  Diagnosis Date  . GERD (gastroesophageal reflux disease)   . Hepatitis C   . Hypertension   . Prostate CA [facility])     Patient Active Problem List   Diagnosis Date Noted  . Absolute anemia 03/25/2018  . Rectal mass 03/25/2018  . Rectal bleeding 03/25/2018  . 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 Hypoalbuminemia 11/12/2017  . Gastroesophageal reflux disease without esophagitis 12/19/2016  . Normochromic normocytic anemia 06/28/2012  . Prostate cancer ([place]) 06/28/2012  . HTN (hypertension) 06/28/2012  . Crohn's disease ([place]) 06/28/2012  . Alcoholism ([place]) 06/28/2012    Past Surgical History:  Procedure Laterality Date  . COLONOSCOPY N/A 03/30/2018   Procedure: COLONOSCOPY;  Surgeon: Rogene Houston, MD;  [facility];  Service: Endoscopy;  Laterality: N/A;   12:45  . [place]  . POLYPECTOMY  03/30/2018   Procedure: POLYPECTOMY;  Surgeon: Rogene Houston, MD;  Location[facility];  Service: Endoscopy;;  colon  . PROSTATE BIOPSY     positive for cancer  . TOTAL HIP ARTHROPLASTY Left    [facility] 11/07/17 Dr. Florian Buff    Allergies Patient has no known allergies.  Family History  Problem Relation Age of Onset  . Hypertension Mother   . Hypertension Father   . Stroke Father     Social History Social History   Tobacco Use  . Smoking status: Never Smoker  . Smokeless tobacco: Never Used  Substance Use Topics  . Alcohol use: Yes    Comment: 2 beers a day  . Drug use: Yes    Types: Marijuana    Comment: smoked yesterday    Review of Systems  Constitutional: No fever/chills Eyes: No visual changes. ENT: No sore throat. Cardiovascular: Denies chest pain. Respiratory: Denies shortness of breath. Gastrointestinal: Positive epigastric abdominal pain. Positive nausea, vomiting.  No diarrhea.  No constipation. Genitourinary: Negative for dysuria. Musculoskeletal: Negative for back pain. Skin: Negative for rash. Neurological: Negative for headaches, focal weakness or numbness.  10-point ROS otherwise negative.  ____________________________________________   PHYSICAL EXAM:  VITAL SIGNS: ED Triage Vitals  Enc Vitals Group     BP 02/10/20 0639 (!) 154/94     Pulse Rate 02/10/20 0639 100  Resp 02/10/20 0639 20     Temp 02/10/20 0639 98.9 F (37.2 C)     Temp src --      SpO2 02/10/20 0639 100 %     Weight 02/10/20 0640 154 lb (69.9 kg)     Height 02/10/20 0640 6\' 4"  (1.93 m)   Constitutional: Alert and oriented. Well appearing and in no acute distress. Eyes: Conjunctivae are normal.  Head: Atraumatic. Nose: No congestion/rhinnorhea. Mouth/Throat: Mucous membranes are moist. Neck: No stridor.   Cardiovascular: Normal rate, regular rhythm. Good peripheral circulation. Grossly normal heart  sounds.   Respiratory: Normal respiratory effort.  No retractions. Lungs CTAB. Gastrointestinal: Soft with mild epigastric tenderness. No rebound or guarding. No distention.  Musculoskeletal:  No gross deformities of extremities. Neurologic:  Normal speech and language. Skin:  Skin is warm, dry and intact. No rash noted.   ____________________________________________   LABS (all labs ordered are listed, but only abnormal results are displayed)  Labs Reviewed  COMPREHENSIVE METABOLIC PANEL - Abnormal; Notable for the following components:      Result Value   Calcium 8.7 (*)    Total Protein 8.3 (*)    AST 54 (*)    All other components within normal limits  CBC WITH DIFFERENTIAL/PLATELET - Abnormal; Notable for the following components:   RBC 3.48 (*)    Hemoglobin 11.1 (*)    HCT 33.2 (*)    RDW 16.5 (*)    All other components within normal limits  LIPASE, BLOOD   ____________________________________________  RADIOLOGY  None  ____________________________________________   PROCEDURES  Procedure(s) performed:   Procedures  None  ____________________________________________   INITIAL IMPRESSION / ASSESSMENT AND PLAN / ED COURSE  Pertinent labs & imaging results that were available during my care of the patient were reviewed by me and considered in my medical decision making (see chart for details).   Patient presents to the emergency department for evaluation of epigastric abdominal pain, weight loss, nausea, vomiting.  He is recently had CT imaging along with upper and lower endoscopies.  The upper endoscopy describes gastritis.  Polyps were removed and the lower endoscopy but no evidence of Crohn's or ulcerative colitis.  Patient's abdominal exam is relatively reassuring.  Hold on additional imaging for now.  Plan for screening lab work and treating symptoms with plan for close GI follow-up at the [place].   Labs reviewed. No acute findings. Patient feeling better after  IVF and zofran. Will discharge home with H2 blocker, Maalox, and Zofran. Patient to f/u with PCP and GI as an outpatient.   At this time, I do not feel there is any life-threatening condition present. I have reviewed and discussed all results (EKG, imaging, lab, urine as appropriate), exam findings with patient. I have reviewed nursing notes and appropriate previous records.  I feel the patient is safe to be discharged home without further emergent workup. Discussed usual and customary return precautions. Patient and family (if present) verbalize understanding and are comfortable with this plan.  Patient will follow-up with their primary care provider. If they do not have a primary care provider, information for follow-up has been provided to them. All questions have been answered.  ____________________________________________  FINAL CLINICAL IMPRESSION(S) / ED DIAGNOSES  Final diagnoses:  Epigastric pain  Nausea     MEDICATIONS GIVEN DURING THIS VISIT:  Medications  sodium chloride 0.9 % bolus 1,000 mL (1,000 mLs Intravenous New Bag/Given (Non-Interop) 02/10/20 0844)  ondansetron (ZOFRAN) injection 4 mg (4 mg Intravenous  Given 02/10/20 0845)  pantoprazole (PROTONIX) injection 40 mg (40 mg Intravenous Given 02/10/20 0845)  alum & mag hydroxide-simeth (MAALOX/MYLANTA) 200-200-20 MG/5ML suspension 15 mL (15 mLs Oral Given 02/10/20 0846)     NEW OUTPATIENT MEDICATIONS STARTED DURING THIS VISIT:  New Prescriptions   ALUM & MAG HYDROXIDE-SIMETH (MAALOX ADVANCED MAX ST) 400-400-40 MG/5ML SUSPENSION    Take 10 mLs by mouth every 6 (six) hours as needed for indigestion.   FAMOTIDINE (PEPCID) 40 MG TABLET    Take 1 tablet (40 mg total) by mouth daily.   ONDANSETRON (ZOFRAN ODT) 4 MG DISINTEGRATING TABLET    Take 1 tablet (4 mg total) by mouth every 8 (eight) hours as needed for nausea or vomiting.    Note:  This document was prepared using Dragon voice recognition software and may include  unintentional dictation errors.  Nanda Quinton, MD, [facility] Emergency Medicine    Shera Laubach, Wonda Olds, MD 02/10/20 [phone]

## 2020-02-10 NOTE — ED Triage Notes (Signed)
Pt c/o continued abd pain with nausea and vomiting. Pt usually seen at the [place], states he had an appt this morning but he is too weak to drive that far this morning. Pt recently had CT completed there that showed gastritis.

## 2020-02-10 NOTE — ED Notes (Signed)
2 cups of water given

## 2020-04-26 ENCOUNTER — Ambulatory Visit: Payer: No Typology Code available for payment source | Admitting: Internal Medicine

## 2020-04-26 ENCOUNTER — Other Ambulatory Visit: Payer: Self-pay

## 2020-04-26 ENCOUNTER — Emergency Department (HOSPITAL_COMMUNITY)
Admission: EM | Admit: 2020-04-26 | Discharge: 2020-04-26 | Disposition: A | Discharge status: Home / Self Care | Payer: No Typology Code available for payment source | Attending: Emergency Medicine | Admitting: Emergency Medicine

## 2020-04-26 ENCOUNTER — Emergency Department (HOSPITAL_COMMUNITY): Payer: No Typology Code available for payment source

## 2020-04-26 ENCOUNTER — Encounter (HOSPITAL_COMMUNITY): Payer: Self-pay

## 2020-04-26 DIAGNOSIS — R000 Tachycardia, unspecified: Secondary | ICD-10-CM | POA: Diagnosis not present

## 2020-04-26 DIAGNOSIS — Z8546 Personal history of malignant neoplasm of prostate: Secondary | ICD-10-CM | POA: Diagnosis not present

## 2020-04-26 DIAGNOSIS — Z96642 Presence of left artificial hip joint: Secondary | ICD-10-CM | POA: Diagnosis not present

## 2020-04-26 DIAGNOSIS — Z79899 Other long term (current) drug therapy: Secondary | ICD-10-CM | POA: Insufficient documentation

## 2020-04-26 DIAGNOSIS — K219 Gastro-esophageal reflux disease without esophagitis: Secondary | ICD-10-CM | POA: Diagnosis not present

## 2020-04-26 DIAGNOSIS — R112 Nausea with vomiting, unspecified: Secondary | ICD-10-CM | POA: Insufficient documentation

## 2020-04-26 DIAGNOSIS — R109 Unspecified abdominal pain: Secondary | ICD-10-CM | POA: Insufficient documentation

## 2020-04-26 DIAGNOSIS — F1099 Alcohol use, unspecified with unspecified alcohol-induced disorder: Secondary | ICD-10-CM | POA: Insufficient documentation

## 2020-04-26 DIAGNOSIS — R5383 Other fatigue: Secondary | ICD-10-CM | POA: Insufficient documentation

## 2020-04-26 DIAGNOSIS — R7401 Elevation of levels of liver transaminase levels: Secondary | ICD-10-CM | POA: Diagnosis not present

## 2020-04-26 DIAGNOSIS — R059 Cough, unspecified: Secondary | ICD-10-CM | POA: Diagnosis not present

## 2020-04-26 DIAGNOSIS — M791 Myalgia, unspecified site: Secondary | ICD-10-CM | POA: Insufficient documentation

## 2020-04-26 DIAGNOSIS — R7989 Other specified abnormal findings of blood chemistry: Secondary | ICD-10-CM

## 2020-04-26 DIAGNOSIS — R1115 Cyclical vomiting syndrome unrelated to migraine: Secondary | ICD-10-CM

## 2020-04-26 DIAGNOSIS — E876 Hypokalemia: Secondary | ICD-10-CM | POA: Diagnosis not present

## 2020-04-26 DIAGNOSIS — R093 Abnormal sputum: Secondary | ICD-10-CM | POA: Diagnosis not present

## 2020-04-26 DIAGNOSIS — I10 Essential (primary) hypertension: Secondary | ICD-10-CM | POA: Diagnosis not present

## 2020-04-26 DIAGNOSIS — Z20822 Contact with and (suspected) exposure to covid-19: Secondary | ICD-10-CM | POA: Insufficient documentation

## 2020-04-26 DIAGNOSIS — Z789 Other specified health status: Secondary | ICD-10-CM

## 2020-04-26 LAB — CBC WITH DIFFERENTIAL/PLATELET
Abs Immature Granulocytes: 0.01 10*3/uL (ref 0.00–0.07)
Basophils Absolute: 0 10*3/uL (ref 0.0–0.1)
Basophils Relative: 0 %
Eosinophils Absolute: 0 10*3/uL (ref 0.0–0.5)
Eosinophils Relative: 0 %
HCT: 35.5 % — ABNORMAL LOW (ref 39.0–52.0)
Hemoglobin: 12 g/dL — ABNORMAL LOW (ref 13.0–17.0)
Immature Granulocytes: 0 %
Lymphocytes Relative: 33 %
Lymphs Abs: 1.5 10*3/uL (ref 0.7–4.0)
MCH: 32.7 pg (ref 26.0–34.0)
MCHC: 33.8 g/dL (ref 30.0–36.0)
MCV: 96.7 fL (ref 80.0–100.0)
Monocytes Absolute: 0.7 10*3/uL (ref 0.1–1.0)
Monocytes Relative: 15 %
Neutro Abs: 2.4 10*3/uL (ref 1.7–7.7)
Neutrophils Relative %: 52 %
Platelets: 213 10*3/uL (ref 150–400)
RBC: 3.67 MIL/uL — ABNORMAL LOW (ref 4.22–5.81)
RDW: 16.2 % — ABNORMAL HIGH (ref 11.5–15.5)
WBC: 4.7 10*3/uL (ref 4.0–10.5)
nRBC: 0 % (ref 0.0–0.2)

## 2020-04-26 LAB — COMPREHENSIVE METABOLIC PANEL
ALT: 49 U/L — ABNORMAL HIGH (ref 0–44)
AST: 79 U/L — ABNORMAL HIGH (ref 15–41)
Albumin: 4.2 g/dL (ref 3.5–5.0)
Alkaline Phosphatase: 63 U/L (ref 38–126)
Anion gap: 17 — ABNORMAL HIGH (ref 5–15)
BUN: 15 mg/dL (ref 8–23)
CO2: 20 mmol/L — ABNORMAL LOW (ref 22–32)
Calcium: 8.8 mg/dL — ABNORMAL LOW (ref 8.9–10.3)
Chloride: 96 mmol/L — ABNORMAL LOW (ref 98–111)
Creatinine, Ser: 1.2 mg/dL (ref 0.61–1.24)
GFR, Estimated: >60 mL/min (ref >60)
Glucose, Bld: 89 mg/dL (ref 70–99)
Potassium: 3.2 mmol/L — ABNORMAL LOW (ref 3.5–5.1)
Sodium: 133 mmol/L — ABNORMAL LOW (ref 135–145)
Total Bilirubin: 1.2 mg/dL (ref 0.3–1.2)
Total Protein: 8.6 g/dL — ABNORMAL HIGH (ref 6.5–8.1)

## 2020-04-26 LAB — URINALYSIS, ROUTINE W REFLEX MICROSCOPIC
Bilirubin Urine: NEGATIVE
Glucose, UA: NEGATIVE mg/dL
Hgb urine dipstick: NEGATIVE
Ketones, ur: NEGATIVE mg/dL
Leukocytes,Ua: NEGATIVE
Nitrite: NEGATIVE
Protein, ur: NEGATIVE mg/dL
Specific Gravity, Urine: 1.01 (ref 1.005–1.030)
pH: 6 (ref 5.0–8.0)

## 2020-04-26 LAB — MAGNESIUM: Magnesium: 1.8 mg/dL (ref 1.7–2.4)

## 2020-04-26 LAB — RESP PANEL BY RT-PCR (FLU A&B, COVID) ARPGX2
Influenza A by PCR: NEGATIVE
Influenza B by PCR: NEGATIVE
SARS Coronavirus 2 by RT PCR: NEGATIVE

## 2020-04-26 MED ORDER — ONDANSETRON HCL 4 MG/2ML IJ SOLN
4.0000 mg | Freq: Once | INTRAMUSCULAR | Status: AC
  Administered 2020-04-26: 09:00:00 4 mg via INTRAVENOUS
  Filled 2020-04-26: qty 2

## 2020-04-26 MED ORDER — POTASSIUM CHLORIDE CRYS ER 20 MEQ PO TBCR: 40.0000 meq | EXTENDED_RELEASE_TABLET | Freq: Every day | ORAL | 0 refills | Status: DC

## 2020-04-26 MED ORDER — POTASSIUM CHLORIDE CRYS ER 20 MEQ PO TBCR
40.0000 meq | EXTENDED_RELEASE_TABLET | Freq: Once | ORAL | Status: AC
  Administered 2020-04-26: 12:00:00 40 meq via ORAL
  Filled 2020-04-26: qty 2

## 2020-04-26 MED ORDER — LORAZEPAM 2 MG/ML IJ SOLN
1.0000 mg | Freq: Once | INTRAMUSCULAR | Status: AC
  Administered 2020-04-26: 12:00:00 1 mg via INTRAVENOUS
  Filled 2020-04-26: qty 1

## 2020-04-26 MED ORDER — POTASSIUM CHLORIDE CRYS ER 20 MEQ PO TBCR
40.0000 meq | EXTENDED_RELEASE_TABLET | Freq: Once | ORAL | Status: AC
  Administered 2020-04-26: 10:00:00 40 meq via ORAL
  Filled 2020-04-26: qty 2

## 2020-04-26 MED ORDER — SODIUM CHLORIDE 0.9 % IV BOLUS
500.0000 mL | Freq: Once | INTRAVENOUS | Status: AC
  Administered 2020-04-26: 09:00:00 500 mL via INTRAVENOUS

## 2020-04-26 MED ORDER — ONDANSETRON 4 MG PO TBDP: ORAL_TABLET | ORAL | 0 refills | Status: DC

## 2020-04-26 MED ORDER — ACETAMINOPHEN 500 MG PO TABS
1000.0000 mg | ORAL_TABLET | Freq: Once | ORAL | Status: AC
  Administered 2020-04-26: 10:00:00 1000 mg via ORAL
  Filled 2020-04-26: qty 2

## 2020-04-26 MED ORDER — FENTANYL CITRATE (PF) 100 MCG/2ML IJ SOLN
50.0000 ug | INTRAMUSCULAR | Status: DC | PRN
  Administered 2020-04-26: 50 ug via INTRAVENOUS
  Filled 2020-04-26: qty 2

## 2020-04-26 MED ORDER — IOHEXOL 300 MG/ML  SOLN
100.0000 mL | Freq: Once | INTRAMUSCULAR | Status: AC | PRN
  Administered 2020-04-26: 11:00:00 100 mL via INTRAVENOUS

## 2020-04-26 MED ORDER — SODIUM CHLORIDE 0.9 % IV BOLUS
1000.0000 mL | Freq: Once | INTRAVENOUS | Status: AC
  Administered 2020-04-26: 09:00:00 1000 mL via INTRAVENOUS

## 2020-04-26 MED ORDER — SODIUM CHLORIDE 0.9 % IV BOLUS
1000.0000 mL | Freq: Once | INTRAVENOUS | Status: AC
  Administered 2020-04-26: 12:00:00 1000 mL via INTRAVENOUS

## 2020-04-26 NOTE — ED Notes (Signed)
MD aware of BP

## 2020-04-26 NOTE — ED Provider Notes (Signed)
[facility] EMERGENCY DEPARTMENT Provider Note   CSN: [id number] Arrival date & time: 04/26/20  0701     History Chief Complaint  Patient presents with  . Vomiting    Jonathan Benitez is a 69 y.o. male.  Patient with history of reflux, hepatitis C, alcohol use, prostate cancer, Crohn's presents with persistent nausea vomiting and body aches for the past 3 to 4 days. Mild cough with productive sputum. Patient was around someone with a cough recently. COVID vaccines up-to-date.        Past Medical History:  Diagnosis Date  . GERD (gastroesophageal reflux disease)   . Hepatitis C   . Hypertension   . Prostate CA [facility])     Patient Active Problem List   Diagnosis Date Noted  . Absolute anemia 03/25/2018  . Rectal mass 03/25/2018  . Rectal bleeding 03/25/2018  . 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 Hypoalbuminemia 11/12/2017  . Gastroesophageal reflux disease without esophagitis 12/19/2016  . Normochromic normocytic anemia 06/28/2012  . Prostate cancer ([place]) 06/28/2012  . HTN (hypertension) 06/28/2012  . Crohn's disease ([place]) 06/28/2012  . Alcoholism ([place]) 06/28/2012    Past Surgical History:  Procedure Laterality Date  . COLONOSCOPY N/A 03/30/2018   Procedure: COLONOSCOPY;  Surgeon: Rogene Houston, MD;  [facility];  Service: Endoscopy;  Laterality: N/A;  12:45  . [place]  . POLYPECTOMY  03/30/2018   Procedure: POLYPECTOMY;  Surgeon: Rogene Houston, MD;  Location[facility];  Service: Endoscopy;;  colon  . PROSTATE BIOPSY     positive for cancer  . TOTAL HIP ARTHROPLASTY Left    [facility] 11/07/17 Dr. Florian Buff       Family History  Problem Relation Age of Onset  . Hypertension Mother   . Hypertension Father   . Stroke Father     Social History   Tobacco Use  . Smoking status: Never Smoker  . Smokeless tobacco: Never Used  Vaping Use  . Vaping Use:  Never used  Substance Use Topics  . Alcohol use: Yes    Comment: occasionally  . Drug use: Yes    Types: Marijuana    Comment: last used this morning    Home Medications Prior to Admission medications   Medication Sig Start Date End Date Taking? Authorizing Provider  alum & mag hydroxide-simeth (MAALOX ADVANCED MAX ST) 400-400-40 MG/5ML suspension Take 10 mLs by mouth every 6 (six) hours as needed for indigestion. 02/10/20   Long, Wonda Olds, MD  cetirizine (ZYRTEC) 10 MG tablet Take 10 mg by mouth 2 (two) times daily.    [provider]  famotidine (PEPCID) 40 MG tablet Take 1 tablet (40 mg total) by mouth daily. 02/10/20   Long, Wonda Olds, MD  fluticasone (FLONASE) 50 MCG/ACT nasal spray Place 2 sprays into both nostrils daily as needed for allergies.     [provider]  lisinopril-hydrochlorothiazide (PRINZIDE,ZESTORETIC) 20-25 MG tablet Take 1 tablet by mouth daily.     [provider]  Menthol-Methyl Salicylate (THERA-GESIC EX) Apply 1 application topically daily as needed (for pain).    [provider]  Multiple Vitamin (MULTIVITAMIN WITH MINERALS) TABS tablet Take 1 tablet by mouth daily.    [provider]  naproxen sodium (ALEVE) 220 MG tablet Take 220 mg by mouth daily as needed (for pain or headache).    [provider]  omeprazole (PRILOSEC) 20 MG capsule Take 20 mg by mouth 2 (two) times daily before a meal.     [provider]  ondansetron (ZOFRAN ODT) 4 MG disintegrating tablet Take 1 tablet (4 mg total) by mouth every 8 (eight) hours as needed for nausea or vomiting. 02/10/20   Long, Wonda Olds, MD  ondansetron (ZOFRAN ODT) 4 MG disintegrating tablet 4mg  ODT q4 hours prn nausea/vomit 04/26/20   Elnora Morrison, MD  potassium chloride SA (KLOR-CON) 20 MEQ tablet Take 2 tablets (40 mEq total) by mouth daily. 04/27/20   Elnora Morrison, MD  sildenafil (REVATIO) 20 MG tablet Take 20 mg by mouth daily as needed (for ED).     [provider]  traMADol (ULTRAM) 50 MG tablet Take 50 mg by mouth 3 (three) times daily as needed for moderate pain.     [provider]  Wheat Dextrin (BENEFIBER DRINK MIX) PACK Take 4 g by mouth at bedtime. 03/30/18   Rogene Houston, MD    Allergies    Patient has no known allergies.  Review of Systems   Review of Systems  Constitutional: Positive for appetite change and chills. Negative for fever.  HENT: Negative for congestion.   Eyes: Negative for visual disturbance.  Respiratory: Positive for cough. Negative for shortness of breath.   Cardiovascular: Negative for chest pain.  Gastrointestinal: Positive for abdominal pain, nausea and vomiting.  Genitourinary: Negative for dysuria and flank pain.  Musculoskeletal: Negative for back pain, neck pain and neck stiffness.  Skin: Negative for rash.  Neurological: Positive for light-headedness. Negative for headaches.    Physical Exam Updated Vital Signs BP (!) 151/90   Pulse (!) 104   Temp 100 F (37.8 C) (Rectal)   Resp 17   Ht 6' (1.829 m)   Wt 69.4 kg   SpO2 100%   BMI 20.75 kg/m   Physical Exam Vitals and nursing note reviewed.  Constitutional:      Appearance: He is well-developed and well-nourished.  HENT:     Head: Normocephalic and atraumatic.     Mouth/Throat:     Mouth: Mucous membranes are dry.  Eyes:     General:        Right eye: No discharge.        Left eye: No discharge.     Conjunctiva/sclera: Conjunctivae normal.  Neck:     Trachea: No tracheal deviation.  Cardiovascular:     Rate and Rhythm: Regular rhythm. Tachycardia present.  Pulmonary:     Effort: Pulmonary effort is normal.     Breath sounds: Normal breath sounds.  Abdominal:     General: There is no distension.     Palpations: Abdomen is soft.     Tenderness: There is abdominal tenderness (central). There is no guarding.  Musculoskeletal:        General: No edema.     Cervical back: Normal range of motion and  neck supple.  Skin:    General: Skin is warm.     Findings: No rash.  Neurological:     General: No focal deficit present.     Mental Status: He is alert and oriented to person, place, and time.  Psychiatric:        Mood and Affect: Mood and affect normal.     Comments: fatigued     ED Results / Procedures / Treatments   Labs (all labs ordered are listed, but only abnormal results are displayed) Labs Reviewed  COMPREHENSIVE METABOLIC PANEL -  Abnormal; Notable for the following components:      Result Value   Sodium 133 (*)    Potassium 3.2 (*)    Chloride 96 (*)    CO2 20 (*)    Calcium 8.8 (*)    Total Protein 8.6 (*)    AST 79 (*)    ALT 49 (*)    Anion gap 17 (*)    All other components within normal limits  CBC WITH DIFFERENTIAL/PLATELET - Abnormal; Notable for the following components:   RBC 3.67 (*)    Hemoglobin 12.0 (*)    HCT 35.5 (*)    RDW 16.2 (*)    All other components within normal limits  RESP PANEL BY RT-PCR (FLU A&B, COVID) ARPGX2  URINE CULTURE  MAGNESIUM  URINALYSIS, ROUTINE W REFLEX MICROSCOPIC    EKG EKG Interpretation  Date/Time:  Wednesday April 26 2020 08:09:06 EST Ventricular Rate:  117 PR Interval:    QRS Duration: 87 QT Interval:  345 QTC Calculation: 482 R Axis:   -52 Text Interpretation: Sinus tachycardia Left anterior fascicular block Borderline T wave abnormalities Borderline prolonged QT interval Baseline wander in lead(s) I III aVL V1 Confirmed by Elnora Morrison [phone]) on 04/26/2020 8:34:42 AM   Radiology CT ABDOMEN PELVIS W CONTRAST  Result Date: 04/26/2020 CLINICAL DATA:  Abdominal pain with vomiting EXAM: CT ABDOMEN AND PELVIS WITH CONTRAST TECHNIQUE: Multidetector CT imaging of the abdomen and pelvis was performed using the standard protocol following bolus administration of intravenous contrast. CONTRAST:  159mL OMNIPAQUE IOHEXOL 300 MG/ML  SOLN COMPARISON:  None. FINDINGS: Lower chest: Lung bases are clear. There  are foci of coronary artery calcification. There is a small hiatal hernia. Hepatobiliary: There is hepatic steatosis. No focal liver lesions are evident. Gallbladder wall is not appreciably thickened. There is no appreciable biliary duct dilatation. Pancreas: There is no evident pancreatic mass or inflammatory focus. Spleen: No splenic lesions are evident. Adrenals/Urinary Tract: Adrenals bilaterally appear normal. There is a cyst in the lower pole of the right kidney measuring 0.9 x 0.8 cm. There is no hydronephrosis on either side. No appreciable renal or ureteral calculus on either side. Urinary bladder is midline with wall thickness within normal limits. Stomach/Bowel: There are scattered sigmoid and descending colonic diverticula without diverticulitis. There is no appreciable bowel wall or mesenteric thickening. No bowel obstruction is appreciable. The terminal ileum appears normal. There is no demonstrable free air or portal venous air. No evident bowel pneumatosis. Vascular/Lymphatic: No aneurysm evident. There is aortic and iliac artery atherosclerotic calcification. Scattered foci of calcification noted in major mesenteric arterial vessels with moderate calcification in particular in the proximal celiac and superior mesenteric artery regions. Major venous structures appear patent. There is no evident adenopathy in the abdomen or pelvis. Reproductive: Prostate and seminal vesicles appear normal with respect to size and contour. Other: Appendix appears unremarkable. No evident abscess or ascites in the abdomen or pelvis. There is mild fat in the umbilicus. Musculoskeletal: Total hip replacement noted on the left. Foci of degenerative change noted in the lumbar spine. No blastic or lytic bone lesions appreciable. No evident intramuscular lesions. IMPRESSION: 1. No bowel obstruction. Appendix appears unremarkable. No abscess in the abdomen or pelvis. 2.  Fairly small hiatal hernia present. 3. No evident renal  or ureteral calculus. No hydronephrosis. Urinary bladder wall thickness normal. 4. Aortic Atherosclerosis (ICD10-I70.0). Fairly extensive calcification in the proximal celiac and superior mesenteric arteries. Foci of pelvic arterial vascular calcification as well as coronary  artery calcification noted. 5.  Hepatic steatosis. Electronically Signed   By: Lowella Grip III M.D.   On: 04/26/2020 11:41   DG Chest Portable 1 View  Result Date: 04/26/2020 CLINICAL DATA:  Cough and tachycardia EXAM: PORTABLE CHEST 1 VIEW COMPARISON:  December 17, 2013 FINDINGS: Lungs are clear. Heart size and pulmonary vascularity are normal. No adenopathy. There is aortic atherosclerosis. There are several prior healed rib fractures on the right. IMPRESSION: Lungs clear.  Heart size normal. Aortic Atherosclerosis (ICD10-I70.0). Electronically Signed   By: Lowella Grip III M.D.   On: 04/26/2020 08:23    Procedures Procedures (including critical care time)  Medications Ordered in ED Medications  fentaNYL (SUBLIMAZE) injection 50 mcg (50 mcg Intravenous Given 04/26/20 0930)  sodium chloride 0.9 % bolus 500 mL (0 mLs Intravenous Stopped 04/26/20 0915)  ondansetron (ZOFRAN) injection 4 mg (4 mg Intravenous Given 04/26/20 0839)  sodium chloride 0.9 % bolus 1,000 mL (0 mLs Intravenous Stopped 04/26/20 1017)  potassium chloride SA (KLOR-CON) CR tablet 40 mEq (40 mEq Oral Given 04/26/20 0931)  acetaminophen (TYLENOL) tablet 1,000 mg (1,000 mg Oral Given 04/26/20 1017)  iohexol (OMNIPAQUE) 300 MG/ML solution 100 mL (100 mLs Intravenous Contrast Given 04/26/20 1116)  potassium chloride SA (KLOR-CON) CR tablet 40 mEq (40 mEq Oral Given 04/26/20 1203)  sodium chloride 0.9 % bolus 1,000 mL (1,000 mLs Intravenous New Bag/Given 04/26/20 1214)  LORazepam (ATIVAN) injection 1 mg (1 mg Intravenous Given 04/26/20 1214)    ED Course  I have reviewed the triage vital signs and the nursing notes.  Pertinent labs & imaging  results that were available during my care of the patient were reviewed by me and considered in my medical decision making (see chart for details).    MDM Rules/Calculators/A&P                          Patient presents with persistent vomiting, body aches, cough and generally feeling unwell for 3 to 4 days. Differential diagnosis including viral syndrome, Covid, bacterial pneumonia, cholecystitis, gastritis, acute Crohn's flare, urine infection/[place], other. Plan for blood work, urinalysis, IV fluid bolus. Covid/flu test returned negative. Patient is having abdominal pain and vomiting and with his age and worsening symptoms for 4 days CT scan ordered for further delineation. Chest x-ray reviewed no obvious infiltrate.  Naitik EDREI NORGAARD was evaluated in Emergency Department on 04/26/2020 for the symptoms described in the history of present illness. He was evaluated in the context of the global COVID-19 pandemic, which necessitated consideration that the patient might be at risk for infection with the SARS-CoV-2 virus that causes COVID-19. Institutional protocols and algorithms that pertain to the evaluation of patients at risk for COVID-19 are in a state of rapid change based on information released by regulatory bodies including the CDC and federal and state organizations. These policies and algorithms were followed during the patient's care in the ED.  Blood work reviewed showing sodium 133, potassium 3.2, hemoglobin 12, liver function mild elevation likely secondary to alcohol use.  PO K given. Other electrolytes from decreased oral intake and dehydration. Patient has outpatient follow-up, improved in the ER tolerating oral solids and liquids.  Repeat IV fluids given, heart rate improved to 104.  Patient stable for close outpatient follow-up.  CT scan reviewed no acute abnormalities.  Final Clinical Impression(s) / ED Diagnoses Final diagnoses:  Vomiting, persistent, in adult  Alcohol use  Acute  abdominal pain  LFT elevation  Hypokalemia    Rx / DC Orders ED Discharge Orders         Ordered    potassium chloride SA (KLOR-CON) 20 MEQ tablet  Daily        04/26/20 1259    ondansetron (ZOFRAN ODT) 4 MG disintegrating tablet        04/26/20 1259           Elnora Morrison, MD 04/26/20 1300

## 2020-04-26 NOTE — ED Triage Notes (Signed)
Pt reports generalized body aches, vomiting, diarrhea, and cough x 4 days.  Reports cough productive at times with greenish colored sputum.

## 2020-04-26 NOTE — Discharge Instructions (Signed)
Use Zofran as needed for nausea and vomiting. Seek help for alcohol use when you are ready. Recheck by primary care doctor in the next week. Take oral potassium as directed and tolerated. Return for new or worsening signs or symptoms.

## 2020-04-27 LAB — URINE CULTURE: Culture: <10000 — AB

## 2020-05-01 ENCOUNTER — Other Ambulatory Visit: Payer: Self-pay

## 2020-05-01 ENCOUNTER — Emergency Department (HOSPITAL_COMMUNITY)
Admission: EM | Admit: 2020-05-01 | Discharge: 2020-05-01 | Disposition: A | Discharge status: Home / Self Care | Payer: No Typology Code available for payment source | Attending: Emergency Medicine | Admitting: Emergency Medicine

## 2020-05-01 DIAGNOSIS — R112 Nausea with vomiting, unspecified: Secondary | ICD-10-CM

## 2020-05-01 DIAGNOSIS — R000 Tachycardia, unspecified: Secondary | ICD-10-CM | POA: Insufficient documentation

## 2020-05-01 DIAGNOSIS — F1590 Other stimulant use, unspecified, uncomplicated: Secondary | ICD-10-CM | POA: Insufficient documentation

## 2020-05-01 DIAGNOSIS — F12188 Cannabis abuse with other cannabis-induced disorder: Secondary | ICD-10-CM | POA: Insufficient documentation

## 2020-05-01 DIAGNOSIS — F1290 Cannabis use, unspecified, uncomplicated: Secondary | ICD-10-CM

## 2020-05-01 DIAGNOSIS — Z79899 Other long term (current) drug therapy: Secondary | ICD-10-CM | POA: Diagnosis not present

## 2020-05-01 DIAGNOSIS — M25562 Pain in left knee: Secondary | ICD-10-CM

## 2020-05-01 DIAGNOSIS — Z8546 Personal history of malignant neoplasm of prostate: Secondary | ICD-10-CM | POA: Insufficient documentation

## 2020-05-01 DIAGNOSIS — G8929 Other chronic pain: Secondary | ICD-10-CM | POA: Insufficient documentation

## 2020-05-01 DIAGNOSIS — M25561 Pain in right knee: Secondary | ICD-10-CM | POA: Diagnosis present

## 2020-05-01 LAB — URINALYSIS, ROUTINE W REFLEX MICROSCOPIC
Bilirubin Urine: NEGATIVE
Glucose, UA: NEGATIVE mg/dL
Hgb urine dipstick: NEGATIVE
Ketones, ur: NEGATIVE mg/dL
Leukocytes,Ua: NEGATIVE
Nitrite: NEGATIVE
Protein, ur: NEGATIVE mg/dL
Specific Gravity, Urine: 1.006 (ref 1.005–1.030)
pH: 7 (ref 5.0–8.0)

## 2020-05-01 LAB — CBC WITH DIFFERENTIAL/PLATELET
Abs Immature Granulocytes: 0.02 10*3/uL (ref 0.00–0.07)
Basophils Absolute: 0.1 10*3/uL (ref 0.0–0.1)
Basophils Relative: 1 %
Eosinophils Absolute: 0 10*3/uL (ref 0.0–0.5)
Eosinophils Relative: 0 %
HCT: 31.7 % — ABNORMAL LOW (ref 39.0–52.0)
Hemoglobin: 10.6 g/dL — ABNORMAL LOW (ref 13.0–17.0)
Immature Granulocytes: 0 %
Lymphocytes Relative: 22 %
Lymphs Abs: 1.2 10*3/uL (ref 0.7–4.0)
MCH: 33.1 pg (ref 26.0–34.0)
MCHC: 33.4 g/dL (ref 30.0–36.0)
MCV: 99.1 fL (ref 80.0–100.0)
Monocytes Absolute: 0.4 10*3/uL (ref 0.1–1.0)
Monocytes Relative: 7 %
Neutro Abs: 3.9 10*3/uL (ref 1.7–7.7)
Neutrophils Relative %: 70 %
Platelets: 166 10*3/uL (ref 150–400)
RBC: 3.2 MIL/uL — ABNORMAL LOW (ref 4.22–5.81)
RDW: 16.1 % — ABNORMAL HIGH (ref 11.5–15.5)
WBC: 5.6 10*3/uL (ref 4.0–10.5)
nRBC: 0 % (ref 0.0–0.2)

## 2020-05-01 LAB — COMPREHENSIVE METABOLIC PANEL
ALT: 41 U/L (ref 0–44)
AST: 65 U/L — ABNORMAL HIGH (ref 15–41)
Albumin: 3.9 g/dL (ref 3.5–5.0)
Alkaline Phosphatase: 54 U/L (ref 38–126)
Anion gap: 14 (ref 5–15)
BUN: 15 mg/dL (ref 8–23)
CO2: 21 mmol/L — ABNORMAL LOW (ref 22–32)
Calcium: 8.5 mg/dL — ABNORMAL LOW (ref 8.9–10.3)
Chloride: 102 mmol/L (ref 98–111)
Creatinine, Ser: 0.98 mg/dL (ref 0.61–1.24)
GFR, Estimated: >60 mL/min (ref >60)
Glucose, Bld: 93 mg/dL (ref 70–99)
Potassium: 3.3 mmol/L — ABNORMAL LOW (ref 3.5–5.1)
Sodium: 137 mmol/L (ref 135–145)
Total Bilirubin: 0.5 mg/dL (ref 0.3–1.2)
Total Protein: 8.1 g/dL (ref 6.5–8.1)

## 2020-05-01 LAB — C-REACTIVE PROTEIN: CRP: 1.1 mg/dL — ABNORMAL HIGH (ref <1.0)

## 2020-05-01 LAB — SEDIMENTATION RATE: Sed Rate: 48 mm/hr — ABNORMAL HIGH (ref 0–16)

## 2020-05-01 LAB — LIPASE, BLOOD: Lipase: 30 U/L (ref 11–51)

## 2020-05-01 MED ORDER — SODIUM CHLORIDE 0.9 % IV BOLUS
1000.0000 mL | Freq: Once | INTRAVENOUS | Status: AC
  Administered 2020-05-01: 20:00:00 1000 mL via INTRAVENOUS

## 2020-05-01 MED ORDER — CAPSAICIN 0.05 % EX CREA: 1.0000 g | TOPICAL_CREAM | Freq: Four times a day (QID) | CUTANEOUS | 0 refills | Status: DC | PRN

## 2020-05-01 MED ORDER — DICLOFENAC SODIUM 1 % EX GEL: 2.0000 g | Freq: Four times a day (QID) | CUTANEOUS | 0 refills | Status: DC

## 2020-05-01 MED ORDER — TRAMADOL HCL 50 MG PO TABS
50.0000 mg | ORAL_TABLET | Freq: Once | ORAL | Status: AC
  Administered 2020-05-01: 21:00:00 50 mg via ORAL
  Filled 2020-05-01: qty 1

## 2020-05-01 MED ORDER — ONDANSETRON HCL 4 MG/2ML IJ SOLN
4.0000 mg | Freq: Once | INTRAMUSCULAR | Status: AC
  Administered 2020-05-01: 20:00:00 4 mg via INTRAVENOUS
  Filled 2020-05-01: qty 2

## 2020-05-01 MED ORDER — POTASSIUM CHLORIDE CRYS ER 20 MEQ PO TBCR
40.0000 meq | EXTENDED_RELEASE_TABLET | Freq: Once | ORAL | Status: AC
  Administered 2020-05-01: 22:00:00 40 meq via ORAL
  Filled 2020-05-01: qty 2

## 2020-05-01 NOTE — ED Provider Notes (Addendum)
[facility] EMERGENCY DEPARTMENT Provider Note   CSN: [id number] Arrival date & time: 05/01/20  1815     History Chief Complaint  Patient presents with   Nausea     Knee pain     Jonathan Benitez is a 69 y.o. male with past medical history significant for hepatitis C, prostate cancer, alcohol use disorder, and Graves' disease who returns to the ED with complaints of persistent nausea symptoms in addition to bilateral knee pain.  I reviewed patient's medical record and he was evaluated on 04/26/2020 for similar complaints.  He is fully immunized for COVID-19, but had a close exposure to a known COVID-19 positive individual.  He was also endorsing a cough.  Mild hypokalemia 3.2 was repleted, no other significant derangements.  CT scan was obtained without any acute abnormalities.  Elevated heart rate improved to 104 with IV fluids.  Ultimately he was discharged home with close outpatient follow-up with his primary care provider.  On my exam, patient reports that he has not been able to tolerate anything per mouth x 7 days, including last ED encounter.  He states that he has an appointment on 05/03/2020 with [facility].  He states that he feels very fatigued and is having difficulty standing and walking around due to his weakness, he suspects in context of no food/drink.  He reports that he had a few french fries yesterday that he was able to keep down, but that they quickly moved through his GI tract and resulted in small, loose BM.  He endorses hematochezia and hemorrhoids.  He denies any hemoptysis, hematemesis, or melena.  Patient also complains of mild dyspnea with exertion.  His bilateral knee pain is acute on chronic from his time in the service, but is contributing to his unsteadiness and difficulty ambulating.  He adamantly denies any urinary symptoms.  HPI     Past Medical History:  Diagnosis Date   GERD (gastroesophageal reflux disease)    Hepatitis C     Hypertension    Prostate CA [facility])     Patient Active Problem List   Diagnosis Date Noted   Absolute anemia 03/25/2018   Rectal mass 03/25/2018   Rectal bleeding 03/25/2018   S/P hip replacement, left 11/19/2017   Avascular necrosis of hip, left ([place]) 11/12/2017   Hypoalbuminemia 11/12/2017   Gastroesophageal reflux disease without esophagitis 12/19/2016   Normochromic normocytic anemia 06/28/2012   Prostate cancer ([place]) 06/28/2012   HTN (hypertension) 06/28/2012   Crohn's disease ([place]) 06/28/2012   Alcoholism ([place]) 06/28/2012    Past Surgical History:  Procedure Laterality Date   COLONOSCOPY N/A 03/30/2018   Procedure: COLONOSCOPY;  Surgeon: Rogene Houston, MD;  Location[facility];  Service: Endoscopy;  Laterality: N/A;  12:45   [place]   POLYPECTOMY  03/30/2018   Procedure: POLYPECTOMY;  Surgeon: Rogene Houston, MD;  Location: [facility] ENDO [facility]ITE;  Service: Endoscopy;;  colon   PROSTATE BIOPSY     positive for cancer   TOTAL HIP ARTHROPLASTY Left    [facility] 11/07/17 Dr. Florian Buff       Family History  Problem Relation Age of Onset   Hypertension Mother    Hypertension Father    Stroke Father     Social History   Tobacco Use   Smoking status: Never Smoker   Smokeless tobacco: Never Used  Vaping Use   Vaping Use: Never used  Substance Use Topics   Alcohol  use: Yes    Comment: occasionally   Drug use: Yes    Types: Marijuana    Comment: last used this morning    Home Medications Prior to Admission medications   Medication Sig Start Date End Date Taking? Authorizing Provider  cetirizine (ZYRTEC) 10 MG tablet Take 10 mg by mouth 2 (two) times daily.   Yes [provider]  famotidine (PEPCID) 40 MG tablet Take 1 tablet (40 mg total) by mouth daily. 02/10/20  Yes Long, Wonda Olds, MD  fluticasone (FLONASE) 50 MCG/ACT nasal spray Place 2 sprays into both nostrils daily as needed for allergies.    Yes  [provider]  lisinopril-hydrochlorothiazide (PRINZIDE,ZESTORETIC) 20-25 MG tablet Take 1 tablet by mouth daily.    Yes [provider]  Menthol-Methyl Salicylate (THERA-GESIC EX) Apply 1 application topically daily as needed (for pain).   Yes [provider]  potassium chloride SA (KLOR-CON) 20 MEQ tablet Take 2 tablets (40 mEq total) by mouth daily. 04/27/20  Yes Elnora Morrison, MD  alum & mag hydroxide-simeth (MAALOX ADVANCED MAX ST) 277-412-87 MG/5ML suspension Take 10 mLs by mouth every 6 (six) hours as needed for indigestion. Patient not taking: No sig reported 02/10/20   Long, Wonda Olds, MD  Capsaicin 0.05 % CREA Apply 1 g topically 4 (four) times daily as needed (abdominal discomfort). 05/01/20   Corena Herter, PA-C  diclofenac Sodium (VOLTAREN) 1 % GEL Apply 2 g topically 4 (four) times daily. 05/01/20   Corena Herter, PA-C  ondansetron (ZOFRAN ODT) 4 MG disintegrating tablet Take 1 tablet (4 mg total) by mouth every 8 (eight) hours as needed for nausea or vomiting. Patient not taking: No sig reported 02/10/20   Long, Wonda Olds, MD  ondansetron (ZOFRAN ODT) 4 MG disintegrating tablet 4mg  ODT q4 hours prn nausea/vomit Patient not taking: No sig reported 04/26/20   Elnora Morrison, MD  sildenafil (REVATIO) 20 MG tablet Take 20 mg by mouth daily as needed (for ED).    [provider]  traMADol (ULTRAM) 50 MG tablet Take 50 mg by mouth 3 (three) times daily as needed for moderate pain.     [provider]  Wheat Dextrin (BENEFIBER DRINK MIX) PACK Take 4 g by mouth at bedtime. Patient not taking: Reported on 05/01/2020 03/30/18   Rogene Houston, MD    Allergies    Patient has no known allergies.  Review of Systems   Review of Systems  All other systems reviewed and are negative.   Physical Exam Updated Vital Signs BP (!) 170/110    Pulse (!) 115    Temp 97.7 F (36.5 C) (Oral)    Resp 14    SpO2 100%   Physical Exam Vitals and  nursing note reviewed. Exam conducted with a chaperone present.  HENT:     Head: Normocephalic and atraumatic.     Mouth/Throat:     Mouth: Mucous membranes are dry.  Eyes:     General: No scleral icterus.    Conjunctiva/sclera: Conjunctivae normal.  Cardiovascular:     Rate and Rhythm: Regular rhythm. Tachycardia present.     Pulses: Normal pulses.     Heart sounds: Normal heart sounds.  Pulmonary:     Effort: Pulmonary effort is normal. No respiratory distress.     Breath sounds: Normal breath sounds. No wheezing or rales.  Abdominal:     General: Abdomen is flat. There is no distension.     Palpations: Abdomen is soft.  Tenderness: There is no abdominal tenderness. There is no guarding.     Comments: No areas of tenderness or guarding.  Musculoskeletal:     Comments: Knees, bilateral: No significant TTP.  No swelling skin changes.  ROM fully intact.  Peripheral pulses intact and symmetric.  Sensation intact throughout.  Skin:    General: Skin is dry.     Capillary Refill: Capillary refill takes less than 2 seconds.  Neurological:     General: No focal deficit present.     Mental Status: He is alert and oriented to person, place, and time.     GCS: GCS eye subscore is 4. GCS verbal subscore is 5. GCS motor subscore is 6.  Psychiatric:        Mood and Affect: Mood normal.        Behavior: Behavior normal.        Thought Content: Thought content normal.     ED Results / Procedures / Treatments   Labs (all labs ordered are listed, but only abnormal results are displayed) Labs Reviewed  CBC WITH DIFFERENTIAL/PLATELET - Abnormal; Notable for the following components:      Result Value   RBC 3.20 (*)    Hemoglobin 10.6 (*)    HCT 31.7 (*)    RDW 16.1 (*)    All other components within normal limits  COMPREHENSIVE METABOLIC PANEL - Abnormal; Notable for the following components:   Potassium 3.3 (*)    CO2 21 (*)    Calcium 8.5 (*)    AST 65 (*)    All other  components within normal limits  URINALYSIS, ROUTINE W REFLEX MICROSCOPIC - Abnormal; Notable for the following components:   Color, Urine STRAW (*)    All other components within normal limits  SEDIMENTATION RATE - Abnormal; Notable for the following components:   Sed Rate 48 (*)    All other components within normal limits  C-REACTIVE PROTEIN - Abnormal; Notable for the following components:   CRP 1.1 (*)    All other components within normal limits  LIPASE, BLOOD    EKG None  Radiology No results found.  Procedures Procedures (including critical care time)  Medications Ordered in ED Medications  potassium chloride SA (KLOR-CON) CR tablet 40 mEq (has no administration in time range)  sodium chloride 0.9 % bolus 1,000 mL (0 mLs Intravenous Stopped 05/01/20 2051)  ondansetron (ZOFRAN) injection 4 mg (4 mg Intravenous Given 05/01/20 1939)  traMADol (ULTRAM) tablet 50 mg (50 mg Oral Given 05/01/20 2051)    ED Course  I have reviewed the triage vital signs and the nursing notes.  Pertinent labs & imaging results that were available during my care of the patient were reviewed by me and considered in my medical decision making (see chart for details).    MDM Rules/Calculators/A&P                          Patient is clinically dehydrated in setting of nausea and emesis.  He states that he cannot keep anything down despite prescribed Zofran ODT. He did not try it at home today, but didn't help him yesterday.  Labs CBC: No leukocytosis.  Mild anemia when compared to labs obtained 5 days ago.  Acute on chronic. CMP: Mildly worsening hypercalcemia to 8.5, but otherwise all lab values are improved.  Only mild hypokalemia 3.3. Lipase: Within normal limits.  Patient was given 1 L IV NS in addition to tramadol  for pain and 4 mg IV Zofran for nausea symptoms.  Patient does admit to regular, daily marijuana use.  On reevaluation, I was anticipating that patient's nausea would not be  controlled as his history is concerning for cannabinoid induced hyperemesis syndrome.  While I was planning on providing 1 mg IV Haldol, his nausea symptoms have seemingly improved with fluids and Zofran.  His knee exams were entirely unremarkable.  ROM fully intact.  Low suspicion for septic or inflammatory arthritis.  No overlying skin changes.  He admits that this is acute on chronic, stemming from his time in the service.  Likely osteoarthritis.  Do not feel as though x-rays are warranted at this time.  On final examination, he states that [facility].  He feels better after tramadol here.  He states that he just took too many of his pills this month because his hip, back, and knees have been bothering him.  He will follow up with his pain doctor.  Discussed case with Dr. Roderic Palau.  No further work-up or intervention warranted.    ED return precautions discussed.  Patient voices understanding and is agreeable to the plan.  While patient was hypertensive here in the ED, he states that he has been unable to tolerate his antihypertensive medications these past 3 days.  He will take them when he gets home.  He will continue with his prescribed Zofran ODT, as directed.  AVS completed, pending PO challenge for discharge.   Final Clinical Impression(s) / ED Diagnoses Final diagnoses:  Cannabinoid hyperemesis syndrome  Chronic pain of both knees    Rx / DC Orders ED Discharge Orders         Ordered    Capsaicin 0.05 % CREA  4 times daily PRN        05/01/20 2136    diclofenac Sodium (VOLTAREN) 1 % GEL  4 times daily        05/01/20 2136           Corena Herter, PA-C 05/01/20 2138    Corena Herter, PA-C 05/01/20 2140    Milton Ferguson, MD 05/02/20 1140

## 2020-05-01 NOTE — Discharge Instructions (Addendum)
Please apply the Voltaren gel topically to your knees bilaterally.  Follow-up with your pain symptoms.  You will need to follow-up with your primary care provider/pain specialist at the [facility] for pain medication refill.  As for your nausea and emesis, suspect that there may be an element of cannabis induced hyperemesis syndrome.  Please read the attachment.  I have prescribed you topical capsaicin cream which can apply to the abdomen as needed for abdominal discomfort.  Please go to your gastroenterology appointment on Thursday, as scheduled.  Return to the ED or seek immediate medical attention should you experience any new or worsening symptoms.

## 2020-05-01 NOTE — ED Triage Notes (Signed)
Nausea with bilateral knee, loss of appetite

## 2020-05-01 NOTE — ED Notes (Signed)
Pt will not drink he says it makes him sick.

## 2020-05-03 ENCOUNTER — Ambulatory Visit: Payer: No Typology Code available for payment source | Admitting: Internal Medicine

## 2020-05-04 ENCOUNTER — Ambulatory Visit: Payer: No Typology Code available for payment source | Admitting: Internal Medicine

## 2020-05-18 ENCOUNTER — Ambulatory Visit: Payer: No Typology Code available for payment source | Admitting: Internal Medicine

## 2020-05-19 ENCOUNTER — Encounter: Payer: Self-pay | Admitting: Internal Medicine

## 2020-05-19 ENCOUNTER — Other Ambulatory Visit: Payer: Self-pay

## 2020-05-19 ENCOUNTER — Ambulatory Visit (INDEPENDENT_AMBULATORY_CARE_PROVIDER_SITE_OTHER): Payer: No Typology Code available for payment source | Admitting: Internal Medicine

## 2020-05-19 VITALS — BP 135/89 | HR 132 | Temp 97.9°F | Resp 20 | Ht 72.0 in | Wt 127.0 lb

## 2020-05-19 DIAGNOSIS — R1115 Cyclical vomiting syndrome unrelated to migraine: Secondary | ICD-10-CM

## 2020-05-19 DIAGNOSIS — K219 Gastro-esophageal reflux disease without esophagitis: Secondary | ICD-10-CM

## 2020-05-19 DIAGNOSIS — E440 Moderate protein-calorie malnutrition: Secondary | ICD-10-CM

## 2020-05-19 DIAGNOSIS — C61 Malignant neoplasm of prostate: Secondary | ICD-10-CM

## 2020-05-19 DIAGNOSIS — D649 Anemia, unspecified: Secondary | ICD-10-CM

## 2020-05-19 DIAGNOSIS — I10 Essential (primary) hypertension: Secondary | ICD-10-CM

## 2020-05-19 DIAGNOSIS — Z7689 Persons encountering health services in other specified circumstances: Secondary | ICD-10-CM

## 2020-05-19 DIAGNOSIS — K529 Noninfective gastroenteritis and colitis, unspecified: Secondary | ICD-10-CM

## 2020-05-19 DIAGNOSIS — M1710 Unilateral primary osteoarthritis, unspecified knee: Secondary | ICD-10-CM

## 2020-05-19 MED ORDER — ONDANSETRON 4 MG PO TBDP: 4.0000 mg | ORAL_TABLET | Freq: Three times a day (TID) | ORAL | 0 refills | Status: DC | PRN

## 2020-05-19 NOTE — Assessment & Plan Note (Signed)
On Pepcid 

## 2020-05-19 NOTE — Assessment & Plan Note (Signed)
Care established Previous chart reviewed History and medications reviewed with the patient 

## 2020-05-19 NOTE — Assessment & Plan Note (Signed)
S/p radiation therapy No active urinary complaints Check PSA

## 2020-05-19 NOTE — Assessment & Plan Note (Signed)
Likely in the setting of Marijuana use Avoid Marijuana use Zofran prescribed

## 2020-05-19 NOTE — Assessment & Plan Note (Signed)
Likely due to cyclic vomiting and diarrhea CT abdomen not suggestive of any active etiology Advised to avoid Marijuana Ensure supplements advised If persistent weight loss, will recheck CT chest, abdomen and pelvis

## 2020-05-19 NOTE — Assessment & Plan Note (Signed)
Hgb stable around 10 C/o chronic intermittent GI blood loss Advised to take iron supplements and Vitamin B12

## 2020-05-19 NOTE — Progress Notes (Signed)
New Patient Office Visit  Subjective:  Patient ID: Jonathan Benitez, male    DOB: 05-09-1951  Age: 70 y.o. MRN: [id number]  CC:  Chief Complaint  Patient presents with  . New Patient (Initial Visit)  . Diarrhea    X 6 months, no appetite, hx of reflux     HPI Jonathan Benitez is a 70 year old male with PMH of HTN, GERD, knee arthritis, prostate cancer s/p radiotherapy, and chronic intermittent diarrhea who presents for establishing care.  He complains of chronic intermittent nausea and vomiting and has been losing weight for the last 6 months. Patient also complains of chronic intermittent diarrhea, which is loose BM to watery diarrhea at times.  He also mentions noticing blurred occasionally in the stool.  He denies any melena or hematemesis.  He had colonoscopy in 2019, which revealed colonic polyp.  CT abdomen did not reveal any acute pathology suggestive of diarrhea.  Of note, patient reports that he smokes marijuana every few days.  BP is well-controlled. Takes medications regularly. Patient denies headache, dizziness, chest pain, dyspnea or palpitations.  He also complains of chronic knee pain, for which he takes tramadol as needed and uses Voltaren gel.  He has an appointment with [place] orthopedic surgeon.  Patient has had 2 doses of COVID vaccine and flu vaccine.   Past Medical History:  Diagnosis Date  . GERD (gastroesophageal reflux disease)   . Hepatitis C   . Hypertension   . Prostate CA [facility])     Past Surgical History:  Procedure Laterality Date  . COLONOSCOPY N/A 03/30/2018   Procedure: COLONOSCOPY;  Surgeon: Rogene Houston, MD;  Location[facility];  Service: Endoscopy;  Laterality: N/A;  12:45  . [place]  . POLYPECTOMY  03/30/2018   Procedure: POLYPECTOMY;  Surgeon: Rogene Houston, MD;  Location: [facility];  Service: Endoscopy;;  colon  . PROSTATE BIOPSY     positive for cancer  . TOTAL HIP ARTHROPLASTY Left    [facility]  11/07/17 Dr. Florian Buff    Family History  Problem Relation Age of Onset  . Hypertension Mother   . Hypertension Father   . Stroke Father     Social History   Socioeconomic History  . Marital status: Widowed    Spouse name: Not on file  . Number of children: Not on file  . Years of education: Not on file  . Highest education level: Not on file  Occupational History  . Not on file  Tobacco Use  . Smoking status: Never Smoker  . Smokeless tobacco: Never Used  Vaping Use  . Vaping Use: Never used  Substance and Sexual Activity  . Alcohol use: Yes    Comment: occasionally  . Drug use: Yes    Types: Marijuana    Comment: last used this morning  . Sexual activity: Not on file  Other Topics Concern  . Not on file  Social History Narrative  . Not on file   Social Determinants of Health   Financial Resource Strain: Not on file  Food Insecurity: Not on file  Transportation Needs: Not on file  Physical Activity: Not on file  Stress: Not on file  Social Connections: Not on file  Intimate Partner Violence: Not on file    ROS Review of Systems  Constitutional: Positive for unexpected weight change. Negative for chills and fever.  HENT: Negative for congestion and sore throat.   Eyes:  Negative for pain and discharge.  Respiratory: Negative for cough and shortness of breath.   Cardiovascular: Negative for chest pain and palpitations.  Gastrointestinal: Positive for diarrhea, nausea and vomiting. Negative for constipation.  Endocrine: Negative for polydipsia and polyuria.  Genitourinary: Negative for dysuria and hematuria.  Musculoskeletal: Positive for arthralgias. Negative for neck pain and neck stiffness.  Skin: Negative for rash.  Neurological: Negative for dizziness, weakness, numbness and headaches.  Psychiatric/Behavioral: Negative for agitation and behavioral problems.    Objective:   Today's Vitals: BP 135/89   Pulse (!) 132   Temp 97.9 F (36.6 C)    Resp 20   Ht 6' (1.829 m)   Wt 127 lb (57.6 kg)   SpO2 98%   BMI 17.22 kg/m   Physical Exam Vitals reviewed.  Constitutional:      General: He is not in acute distress.    Appearance: He is not diaphoretic.  HENT:     Head: Normocephalic and atraumatic.     Nose: Nose normal.     Mouth/Throat:     Mouth: Mucous membranes are moist.  Eyes:     General: No scleral icterus.    Extraocular Movements: Extraocular movements intact.     Pupils: Pupils are equal, round, and reactive to light.  Cardiovascular:     Rate and Rhythm: Regular rhythm. Tachycardia present.     Pulses: Normal pulses.     Heart sounds: Normal heart sounds. No murmur heard.   Pulmonary:     Breath sounds: Normal breath sounds. No wheezing or rales.  Abdominal:     Palpations: Abdomen is soft.     Tenderness: There is no abdominal tenderness. There is no guarding or rebound.  Musculoskeletal:     Cervical back: Neck supple. No tenderness.     Right lower leg: No edema.     Left lower leg: No edema.  Skin:    General: Skin is warm.     Findings: No rash.  Neurological:     General: No focal deficit present.     Mental Status: He is alert and oriented to person, place, and time.     Sensory: No sensory deficit.     Motor: No weakness.  Psychiatric:        Mood and Affect: Mood normal.        Behavior: Behavior normal.     Assessment & Plan:   Problem List Items Addressed This Visit      Encounter to establish care - Primary   Care established Previous chart reviewed History and medications reviewed with the patient     Relevant Orders  CBC with Differential  CMP14+EGFR  HgB A1c  TSH + free T4  Vitamin D (25 hydroxy)  Lipid Profile    Cardiovascular and Mediastinum   HTN (hypertension)    BP Readings from Last 1 Encounters:  05/19/20 135/89   Well-controlled with Lisinopril-HCTZ Counseled for compliance with the medications Advised DASH diet and moderate exercise/walking, at  least 150 mins/week         Digestive   Gastroesophageal reflux disease without esophagitis    On Pepcid      Relevant Medications   ondansetron (ZOFRAN ODT) 4 MG disintegrating tablet   Chronic diarrhea    C/o chronic intermittent watery diarrhea No systemic symptoms like fever, chills Occasional hematochezia, no melena CT abdomen negative any acute inflammatory change Advised to avoid illicit drug use Ensure supplements      Relevant Orders  HIV antibody (with reflex)   Cyclic vomiting syndrome    Likely in the setting of Marijuana use Avoid Marijuana use Zofran prescribed      Relevant Medications   ondansetron (ZOFRAN ODT) 4 MG disintegrating tablet     Musculoskeletal and Integument   Arthritis of knee    Takes Tramadol PRN Has an appointment with [place] [facility] surgeon        Genitourinary   Prostate cancer [facility])    S/p radiation therapy No active urinary complaints Check PSA      Relevant Medications   ondansetron (ZOFRAN ODT) 4 MG disintegrating tablet   Other Relevant Orders   PSA     Other   Normochromic normocytic anemia    Hgb stable around 10 C/o chronic intermittent GI blood loss Advised to take iron supplements and Vitamin B12         Moderate protein-calorie malnutrition (HCC)    Likely due to cyclic vomiting and diarrhea CT abdomen not suggestive of any active etiology Advised to avoid Marijuana Ensure supplements advised If persistent weight loss, will recheck CT chest, abdomen and pelvis         Outpatient Encounter Medications as of 05/19/2020  Medication Sig  . alum & mag hydroxide-simeth (MAALOX ADVANCED MAX ST) 400-400-40 MG/5ML suspension Take 10 mLs by mouth every 6 (six) hours as needed for indigestion.  . Capsaicin 0.05 % CREA Apply 1 g topically 4 (four) times daily as needed (abdominal discomfort).  . cetirizine (ZYRTEC) 10 MG tablet Take 10 mg by mouth 2 (two) times daily.  . diclofenac Sodium (VOLTAREN) 1 % GEL  Apply 2 g topically 4 (four) times daily.  . famotidine (PEPCID) 40 MG tablet Take 1 tablet (40 mg total) by mouth daily.  . fluticasone (FLONASE) 50 MCG/ACT nasal spray Place 2 sprays into both nostrils daily as needed for allergies.   Marland Kitchen lisinopril-hydrochlorothiazide (PRINZIDE,ZESTORETIC) 20-25 MG tablet Take 1 tablet by mouth daily.   . Menthol-Methyl Salicylate (THERA-GESIC EX) Apply 1 application topically daily as needed (for pain).  . potassium chloride SA (KLOR-CON) 20 MEQ tablet Take 2 tablets (40 mEq total) by mouth daily.  . sildenafil (REVATIO) 20 MG tablet Take 20 mg by mouth daily as needed (for ED).  Marland Kitchen traMADol (ULTRAM) 50 MG tablet Take 50 mg by mouth 3 (three) times daily as needed for moderate pain.   . Wheat Dextrin (BENEFIBER DRINK MIX) PACK Take 4 g by mouth at bedtime.  . [DISCONTINUED] ondansetron (ZOFRAN ODT) 4 MG disintegrating tablet Take 1 tablet (4 mg total) by mouth every 8 (eight) hours as needed for nausea or vomiting.  . [DISCONTINUED] ondansetron (ZOFRAN ODT) 4 MG disintegrating tablet 4mg  ODT q4 hours prn nausea/vomit  . ondansetron (ZOFRAN ODT) 4 MG disintegrating tablet Take 1 tablet (4 mg total) by mouth every 8 (eight) hours as needed for nausea or vomiting.   No facility-administered encounter medications on file as of 05/19/2020.    Follow-up: Return in about 6 weeks (around 06/30/2020).   Lindell Spar, MD

## 2020-05-19 NOTE — Assessment & Plan Note (Signed)
C/o chronic intermittent watery diarrhea No systemic symptoms like fever, chills Occasional hematochezia, no melena CT abdomen negative any acute inflammatory change Advised to avoid illicit drug use Ensure supplements

## 2020-05-19 NOTE — Assessment & Plan Note (Signed)
BP Readings from Last 1 Encounters:  05/19/20 135/89   Well-controlled with Lisinopril-HCTZ Counseled for compliance with the medications Advised DASH diet and moderate exercise/walking, at least 150 mins/week

## 2020-05-19 NOTE — Assessment & Plan Note (Signed)
Takes Tramadol PRN Has an appointment with [place] [facility] surgeon

## 2020-05-19 NOTE — Patient Instructions (Signed)
Please continue to take medications as prescribed.  Please take Ferrous sulphate 325 mg once daily and Vitamin B12 1000 mcg once daily.  Please avoid using Marijuana as it can cause cyclic vomiting.  Please follow up with GI as scheduled.  Please take Ensure supplements as protein supplement.

## 2020-06-30 ENCOUNTER — Encounter: Payer: Self-pay | Admitting: Nurse Practitioner

## 2020-06-30 ENCOUNTER — Ambulatory Visit (INDEPENDENT_AMBULATORY_CARE_PROVIDER_SITE_OTHER): Payer: No Typology Code available for payment source | Admitting: Nurse Practitioner

## 2020-06-30 ENCOUNTER — Other Ambulatory Visit: Payer: Self-pay

## 2020-06-30 VITALS — BP 107/74 | HR 90 | Temp 97.9°F | Resp 18 | Ht 72.0 in | Wt 144.0 lb

## 2020-06-30 DIAGNOSIS — M1710 Unilateral primary osteoarthritis, unspecified knee: Secondary | ICD-10-CM

## 2020-06-30 DIAGNOSIS — R1115 Cyclical vomiting syndrome unrelated to migraine: Secondary | ICD-10-CM | POA: Diagnosis not present

## 2020-06-30 DIAGNOSIS — I10 Essential (primary) hypertension: Secondary | ICD-10-CM | POA: Diagnosis not present

## 2020-06-30 MED ORDER — OMEPRAZOLE 40 MG PO CPDR: 40.0000 mg | DELAYED_RELEASE_CAPSULE | Freq: Every day | ORAL | 3 refills | Status: DC

## 2020-06-30 MED ORDER — MELOXICAM 7.5 MG PO TABS: 7.5000 mg | ORAL_TABLET | Freq: Every day | ORAL | 0 refills | Status: DC

## 2020-06-30 NOTE — Assessment & Plan Note (Signed)
-  well controlled today 

## 2020-06-30 NOTE — Progress Notes (Signed)
Acute Office Visit  Subjective:    Patient ID: Jonathan Benitez, male    DOB: 1950-07-28, 70 y.o.   MRN: [id number]  Chief Complaint  Patient presents with  . Follow-up    GERD   . Arthritis    Pt in a lot of pain, ankles and knees.     HPI Patient is in today for follow-up for chronic N/V. He was also having chronic intermittent diarrhea with occasional blood in the stool. He endorses the use of MJ.  He states he recently went to the [place] and was treated for his GI issues. He states he still has some diarrhea.  He states he is here today for arthritis. Arthritis is affecting both knees and ankles. He states he is not able to get sleep. He states he was given some tylenol for his arthritic pain. He has tramadol, and he states he usually takes 1-2 tramadol per day.  Past Medical History:  Diagnosis Date  . GERD (gastroesophageal reflux disease)   . Hepatitis C   . Hypertension   . Prostate CA [facility])     Past Surgical History:  Procedure Laterality Date  . COLONOSCOPY N/A 03/30/2018   Procedure: COLONOSCOPY;  Surgeon: Rogene Houston, MD;  Location: [facility];  Service: Endoscopy;  Laterality: N/A;  12:45  . [place]  . POLYPECTOMY  03/30/2018   Procedure: POLYPECTOMY;  Surgeon: Rogene Houston, MD;  Location: [facility];  Service: Endoscopy;;  colon  . PROSTATE BIOPSY     positive for cancer  . TOTAL HIP ARTHROPLASTY Left    [facility] 11/07/17 Dr. Florian Buff    Family History  Problem Relation Age of Onset  . Hypertension Mother   . Hypertension Father   . Stroke Father     Social History   Socioeconomic History  . Marital status: Widowed    Spouse name: Not on file  . Number of children: Not on file  . Years of education: Not on file  . Highest education level: Not on file  Occupational History  . Not on file  Tobacco Use  . Smoking status: Never Smoker  . Smokeless tobacco: Never Used  Vaping Use  . Vaping Use: Never used   Substance and Sexual Activity  . Alcohol use: Yes    Comment: occasionally  . Drug use: Yes    Types: Marijuana    Comment: last used this morning  . Sexual activity: Not on file  Other Topics Concern  . Not on file  Social History Narrative  . Not on file   Social Determinants of Health   Financial Resource Strain: Not on file  Food Insecurity: Not on file  Transportation Needs: Not on file  Physical Activity: Not on file  Stress: Not on file  Social Connections: Not on file  Intimate Partner Violence: Not on file    Outpatient Medications Prior to Visit  Medication Sig Dispense Refill  . alum & mag hydroxide-simeth (MAALOX ADVANCED MAX ST) 400-400-40 MG/5ML suspension Take 10 mLs by mouth every 6 (six) hours as needed for indigestion. 355 mL 0  . Capsaicin 0.05 % CREA Apply 1 g topically 4 (four) times daily as needed (abdominal discomfort). 57 g 0  . cetirizine (ZYRTEC) 10 MG tablet Take 10 mg by mouth 2 (two) times daily.    . diclofenac Sodium (VOLTAREN) 1 % GEL Apply 2 g topically 4 (four) times daily. 100  g 0  . famotidine (PEPCID) 40 MG tablet Take 1 tablet (40 mg total) by mouth daily. 30 tablet 0  . fluticasone (FLONASE) 50 MCG/ACT nasal spray Place 2 sprays into both nostrils daily as needed for allergies.     Marland Kitchen lisinopril-hydrochlorothiazide (PRINZIDE,ZESTORETIC) 20-25 MG tablet Take 1 tablet by mouth daily.     . Menthol-Methyl Salicylate (THERA-GESIC EX) Apply 1 application topically daily as needed (for pain).    . ondansetron (ZOFRAN ODT) 4 MG disintegrating tablet Take 1 tablet (4 mg total) by mouth every 8 (eight) hours as needed for nausea or vomiting. 20 tablet 0  . potassium chloride SA (KLOR-CON) 20 MEQ tablet Take 2 tablets (40 mEq total) by mouth daily. 6 tablet 0  . sildenafil (REVATIO) 20 MG tablet Take 20 mg by mouth daily as needed (for ED).    Marland Kitchen traMADol (ULTRAM) 50 MG tablet Take 50 mg by mouth 3 (three) times daily as needed for moderate pain.      . Wheat Dextrin (BENEFIBER DRINK MIX) PACK Take 4 g by mouth at bedtime.     No facility-administered medications prior to visit.    No Known Allergies  Review of Systems     Objective:    Physical Exam  BP 107/74   Pulse 90   Temp 97.9 F (36.6 C)   Resp 18   Ht 6' (1.829 m)   Wt 144 lb (65.3 kg)   SpO2 98%   BMI 19.53 kg/m  Wt Readings from Last 3 Encounters:  06/30/20 144 lb (65.3 kg)  05/19/20 127 lb (57.6 kg)  04/26/20 153 lb (69.4 kg)    Health Maintenance Due  Topic Date Due  . COVID-19 Vaccine (1) Never done  . TETANUS/TDAP  Never done  . PNA vac Low Risk Adult (1 of 2 - PCV13) Never done  . INFLUENZA VACCINE  Never done    There are no preventive care reminders to display for this patient.   No results found for: TSH Lab Results  Component Value Date   WBC 5.6 05/01/2020   HGB 10.6 (L) 05/01/2020   HCT 31.7 (L) 05/01/2020   MCV 99.1 05/01/2020   PLT 166 05/01/2020   Lab Results  Component Value Date   NA 137 05/01/2020   K 3.3 (L) 05/01/2020   CO2 21 (L) 05/01/2020   GLUCOSE 93 05/01/2020   BUN 15 05/01/2020   CREATININE 0.98 05/01/2020   BILITOT 0.5 05/01/2020   ALKPHOS 54 05/01/2020   AST 65 (H) 05/01/2020   ALT 41 05/01/2020   PROT 8.1 05/01/2020   ALBUMIN 3.9 05/01/2020   CALCIUM 8.5 (L) 05/01/2020   ANIONGAP 14 05/01/2020   No results found for: CHOL No results found for: HDL No results found for: LDLCALC No results found for: TRIG No results found for: CHOLHDL No results found for: HGBA1C     Assessment & Plan:   Problem List Items Addressed This Visit      Cardiovascular and Mediastinum   HTN (hypertension)    -well controlled today        Digestive   Cyclic vomiting syndrome - Primary    -referral to GI      Relevant Orders   Ambulatory referral to Gastroenterology     Musculoskeletal and Integument   Arthritis of knee    -Rx. Tylenol -Rx. meloxicam -we discussed that NSAIDs may make his reflux worse,  so we will start low and make changes as long as  his symptoms aren't worse -he may benefit from autoimmune workup if pain does not improve      Relevant Medications   meloxicam (MOBIC) 7.5 MG tablet       Meds ordered this encounter  Medications  . DISCONTD: omeprazole (PRILOSEC) 40 MG capsule    Sig: Take 1 capsule (40 mg total) by mouth daily.    Dispense:  30 capsule    Refill:  3  . DISCONTD: meloxicam (MOBIC) 7.5 MG tablet    Sig: Take 1 tablet (7.5 mg total) by mouth daily.    Dispense:  30 tablet    Refill:  0  . meloxicam (MOBIC) 7.5 MG tablet    Sig: Take 1 tablet (7.5 mg total) by mouth daily.    Dispense:  30 tablet    Refill:  0  . omeprazole (PRILOSEC) 40 MG capsule    Sig: Take 1 capsule (40 mg total) by mouth daily.    Dispense:  30 capsule    Refill:  [place], NP

## 2020-06-30 NOTE — Assessment & Plan Note (Signed)
-  referral to GI

## 2020-06-30 NOTE — Assessment & Plan Note (Addendum)
-  Rx. Tylenol -Rx. meloxicam -we discussed that NSAIDs may make his reflux worse, so we will start low and make changes as long as his symptoms aren't worse -he may benefit from autoimmune workup if pain does not improve

## 2020-07-28 ENCOUNTER — Ambulatory Visit: Payer: No Typology Code available for payment source | Admitting: Internal Medicine

## 2020-08-09 ENCOUNTER — Ambulatory Visit (INDEPENDENT_AMBULATORY_CARE_PROVIDER_SITE_OTHER): Payer: No Typology Code available for payment source | Admitting: Orthopedic Surgery

## 2020-08-09 ENCOUNTER — Encounter: Payer: Self-pay | Admitting: Orthopedic Surgery

## 2020-08-09 ENCOUNTER — Ambulatory Visit: Payer: No Typology Code available for payment source

## 2020-08-09 ENCOUNTER — Other Ambulatory Visit: Payer: Self-pay

## 2020-08-09 VITALS — BP 114/75 | HR 108 | Ht 72.0 in | Wt 142.0 lb

## 2020-08-09 DIAGNOSIS — G8929 Other chronic pain: Secondary | ICD-10-CM

## 2020-08-09 DIAGNOSIS — M5136 Other intervertebral disc degeneration, lumbar region: Secondary | ICD-10-CM | POA: Diagnosis not present

## 2020-08-09 DIAGNOSIS — M5441 Lumbago with sciatica, right side: Secondary | ICD-10-CM | POA: Diagnosis not present

## 2020-08-09 DIAGNOSIS — Z96642 Presence of left artificial hip joint: Secondary | ICD-10-CM | POA: Diagnosis not present

## 2020-08-09 DIAGNOSIS — M25551 Pain in right hip: Secondary | ICD-10-CM

## 2020-08-09 DIAGNOSIS — M7062 Trochanteric bursitis, left hip: Secondary | ICD-10-CM

## 2020-08-09 DIAGNOSIS — M5442 Lumbago with sciatica, left side: Secondary | ICD-10-CM

## 2020-08-09 MED ORDER — GABAPENTIN 100 MG PO CAPS
100.0000 mg | ORAL_CAPSULE | Freq: Three times a day (TID) | ORAL | 2 refills | Status: DC
Start: 2020-08-09 — End: 2021-01-09

## 2020-08-09 NOTE — Patient Instructions (Addendum)
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 Bursae are small fluid-filled sacs that absorb shock and prevent bones from rubbing against each other. Hip bursitis can cause mild to moderate pain, and symptoms often come and go over time. What are the causes? This condition results from increased friction between the hip bones and the tendons around the hip joint. This condition can happen if you:  Overuse your hip muscles.  Injure your hip.  Have weak buttocks muscles.  Have bone spurs.  Have an infection. In some cases, the cause may not be known. What increases the risk? You are more likely to develop this condition if:  You injured your hip previously or had hip surgery.  You have a medical condition, such as arthritis, gout, diabetes, or thyroid disease.  You have spine problems.  You have one leg that is shorter than the other.  You participate in athletic activities that include repetitive motion, like running.  You participate in sports where there is a risk of injury or falling, such as football, martial arts, or skiing. What are the signs or symptoms? Symptoms may come and go, and they often include:  Pain in the hip or groin area. Pain may get worse with movement.  Tenderness and swelling of the hip. In rare cases, the bursa may become infected. If this happens, you may get a fever, as well as warmth and redness in the hip area. How is this diagnosed? This condition may be diagnosed based on:  Your symptoms.  Your medical history.  A physical exam.  Imaging tests, such  as: ? X-rays to check your bones. ? MRI or ultrasound to check your tendons and muscles. ? Bone scan.  A biopsy to remove fluid from your inflamed bursa for testing. How is this treated? This condition is treated by resting, icing, applying pressure (compression), and raising (elevating) the injured area. This is called RICE treatment. In some cases, RICE treatment may not be enough to make your symptoms go away. Treatment may also include:  Taking medicine to help with swelling and pain.  Using crutches, a cane, or a walker to decrease the strain on your hip.  Getting a shot of cortisone medicine to help reduce swelling.  Taking other medicines if the bursa is infected.  Draining fluid out of the bursa to help relieve swelling.  Having surgery to remove a damaged or infected bursa. This is rare. Long-term treatment may include:  Physical therapy exercises for strength and flexibility.  Lifestyle changes, such as weight loss, to reduce the strain on the hip. Follow these instructions at home: Managing pain, stiffness, and swelling  If directed, put ice on the painful area. ? Put ice in a plastic bag. ? Place a towel between your skin and the bag. ? Leave the ice on for 20 minutes, 2-3 times a day.  Raise (elevate) your hip as much as you can without pain. To do this, put a pillow under your hips while you lie down.  If directed, apply heat to the affected area as  often as told by your health care provider. Use the heat source that your health care provider recommends, such as a moist heat pack or a heating pad. ? Place a towel between your skin and the heat source. ? Leave the heat on for 20-30 minutes. ? Remove the heat if your skin turns bright red. This is especially important if you are unable to feel pain, heat, or cold. You may have a greater risk of getting burned.      Activity  Do not use your hip to support your body weight until your health care provider says  that you can. Use crutches, a cane, or a walker as told by your health care provider.  If the affected leg is one that you use to drive, ask your health care provider if it is safe to drive.  Rest and protect your hip as much as possible until your pain and swelling get better.  Return to your normal activities as told by your health care provider. Ask your health care provider what activities are safe for you.  Do exercises as told by your health care provider. General instructions  Take over-the-counter and prescription medicines only as told by your health care provider.  Gently massage and stretch your injured area as often as is comfortable.  Wear compression wraps only as told by your health care provider.  If one of your legs is shorter than the other, get fitted for a shoe insert or orthotic.  Maintain a healthy weight. Follow instructions from your health care provider for weight control. These may include dietary restrictions.  Keep all follow-up visits as told by your health care provider. This is important. How is this prevented?  Exercise regularly, as told by your health care provider.  Wear supportive footwear that is appropriate for your sport.  Warm up and stretch before being active. Cool down and stretch after being active.  Take breaks regularly from repetitive activity.  If an activity irritates your hip or causes pain, avoid the activity as much as possible.  Avoid sitting down for long periods at a time. Where to find more information  American Academy [employer]opa[employer]geons: orthoinfo.aaos.org Contact a health care provider if:  You have a fever.  You develop new symptoms.  You have trouble walking or doing everyday activities.  You have pain that gets worse or does not get better with medicine.  You develop red skin or a feeling of warmth in your hip area. Get help right away if:  You cannot move your hip.  You have severe pain.  You  cannot control the muscles in your feet. Summary  Hip bursitis is the inflammation of one or more bursae in the hip joint. Bursae are small fluid-filled sacs that absorb shock and prevent bones from rubbing against each other.  Hip bursitis can cause hip or groin pain, and symptoms often come and go over time.  This condition is often treated by resting, icing, applying pressure (compression), and raising (elevating) the injured area. Other treatments may be needed. This information is not intended to replace advice given to you by your health care provider. Make sure you discuss any questions you have with your health care provider. Document Revised: 03/01/2019 Document Reviewed: 01/05/2018 Elsevier Patient Education  2021 [place].  Degenerative Disk Disease  Degenerative disk disease is a condition caused by changes that occur in the spinal disks as a person ages. Spinal disks are soft and compressible disks located  between the bones of your spine (vertebrae). These disks act like shock absorbers. Degenerative disk disease can affect the whole spine. However, the neck and lower back are most often affected. Many changes can occur in the spinal disks with aging, such as:  The spinal disks may dry and shrink.  Small tears may occur in the tough, outer covering of the disk (annulus).  The disk space may become smaller due to loss of water.  Abnormal growths in the bone (spurs) may occur. This can put pressure on the nerve roots exiting the spinal canal, causing pain.  The spinal canal may become narrowed. What are the causes? This condition may be caused by:  Normal degeneration with age.  Injuries.  Certain activities and sports that cause damage. What increases the risk? The following factors may make you more likely to develop this condition:  Being overweight.  Having a family history of degenerative disk disease.  Smoking and use of products that contain nicotine  and tobacco.  Sudden injury.  Doing work that requires heavy lifting. What are the signs or symptoms? Symptoms of this condition include:  Pain that varies in intensity. Some people have no pain, while others have severe pain. The location of the pain depends on the part of your backbone that is affected. You may have: ? Pain in your neck or arm if a disk in your neck area is affected. ? Pain in your back, buttocks, or legs if a disk in your lower back is affected.  Pain that becomes worse while bending or reaching up, or with twisting movements.  Pain that may start gradually and worsen as time passes. It may also start after a major or minor injury.  Numbness or tingling in the arms or legs. How is this diagnosed? This condition may be diagnosed based on:  Your symptoms and medical history.  A physical exam.  Imaging tests, including: ? X-ray of the spine. ? CT scan. ? MRI. How is this treated? This condition may be treated with:  Medicines.  Injection of steroids into the back.  Rehabilitation exercises. These activities aim to strengthen muscles in your back and abdomen to better support your spine. If treatments do not help to relieve your symptoms or you have severe pain, you may need surgery. Follow these instructions at home: Medicines  Take over-the-counter and prescription medicines only as told by your health care provider.  Ask your health care provider if the medicine prescribed to you: ? Requires you to avoid driving or using machinery. ? Can cause constipation. You may need to take these actions to prevent or treat constipation:  Drink enough fluid to keep your urine pale yellow.  Take over-the-counter or prescription medicines.  Eat foods that are high in fiber, such as beans, whole grains, and fresh fruits and vegetables.  Limit foods that are high in fat and processed sugars, such as fried or sweet foods. Activity  Rest as told by your health  care provider.  Avoid sitting for a long time without moving. Get up to take short walks every 1-2 hours. This is important to improve blood flow and breathing. Ask for help if you feel weak or unsteady.  Return to your normal activities as told by your health care provider. Ask your health care provider what activities are safe for you.  Perform relaxation exercises as told by your health care provider.  Maintain good posture.  Do not lift anything that is heavier than 10 lb (  4.5 kg), or the limit that you are told, until your health care provider says that it is safe.  Follow proper lifting and walking techniques as told by your health care provider. Managing pain, stiffness, and swelling  If directed, put ice on the painful area. Icing can help to relieve pain. To do this: ? Put ice in a plastic bag. ? Place a towel between your skin and the bag. ? Leave the ice on for 20 minutes, 2-3 times a day. ? Remove the ice if your skin turns bright red. This is very important. If you cannot feel pain, heat, or cold, you have a greater risk of damage to the area.  If directed, apply heat to the painful area as often as told by your health care provider. Heat can reduce the stiffness of your muscles. Use the heat source that your health care provider recommends, such as a moist heat pack or a heating pad. ? Place a towel between your skin and the heat source. ? Leave the heat on for 20-30 minutes. ? Remove the heat if your skin turns bright red. This is especially important if you are unable to feel pain, heat, or cold. You may have a greater risk of getting burned.      General instructions  Change your sitting, standing, and sleeping habits as told by your health care provider.  Avoid sitting in the same position for long periods of time. Change positions frequently.  Lose weight or maintain a healthy weight as told by your health care provider.  Do not use any products that contain  nicotine or tobacco, such as cigarettes, e-cigarettes, and chewing tobacco. If you need help quitting, ask your health care provider.  Wear supportive footwear.  Keep all follow-up visits. This is important. This may include visits for physical therapy. Contact a health care provider if you:  Have pain that does not go away within 1-4 weeks.  Lose your appetite.  Lose weight without trying. Get help right away if you:  Have severe pain.  Notice weakness in your arms, hands, or legs.  Begin to lose control of your bladder or bowel movements.  Have fevers or night sweats. Summary  Degenerative disk disease is a condition caused by changes that occur in the spinal disks as a person ages.  This condition can affect the whole spine. However, the neck and lower back are most often affected.  Take over-the-counter and prescription medicines only as told by your health care provider. This information is not intended to replace advice given to you by your health care provider. Make sure you discuss any questions you have with your health care provider. Document Revised: 08/12/2019 Document Reviewed: 08/12/2019 Elsevier Patient Education  2021 [employer].

## 2020-08-09 NOTE — Progress Notes (Signed)
NEW PROBLEM//OFFICE VISIT  Summary assessment and plan:   Encounter Diagnoses  Name Primary?  . Chronic bilateral low back pain with bilateral sciatica Yes  . Degenerative disc disease, lumbar   . Trochanteric bursitis, left hip     Inject left hip bursitis  Physical therapy for the bilateral leg pain from his back disease  Gabapentin to help with his leg pain    Chief Complaint  Patient presents with  . Knee Pain    Bilateral / also has hip pain advised visit today for knees, may need to discuss hip next office visit (history of left hip replacement states needs right hip replacement)    70 year old Benitez be a veteran presents with initially bilateral knee pain but is actually bilateral leg pain he is already had a left total hip in 2011 but complains of pain radiating from his hips down to his feet both sides with complaints of right groin pain as well  Review of systems denies chest pain or shortness of breath complains of diarrhea no fever chills or weight loss no numbness or tingling  He does have hypertension is on multiple medications     Review of Systems  All other systems reviewed and are negative.    Past Medical History:  Diagnosis Date  . GERD (gastroesophageal reflux disease)   . Hepatitis C   . Hypertension   . Prostate CA [facility])     Past Surgical History:  Procedure Laterality Date  . COLONOSCOPY N/A 03/30/2018   Procedure: COLONOSCOPY;  Surgeon: Rogene Houston, MD;  [facility];  Service: Endoscopy;  Laterality: N/A;  12:45  . [place]  . POLYPECTOMY  03/30/2018   Procedure: POLYPECTOMY;  Surgeon: Rogene Houston, MD;  Location: [facility];  Service: Endoscopy;;  colon  . PROSTATE BIOPSY     positive for cancer  . TOTAL HIP ARTHROPLASTY Left    [facility] 11/07/17 Dr. Florian Buff    Family History  Problem Relation Age of Onset  . Hypertension Mother   . Hypertension Father   . Stroke Father     Social History   Tobacco Use  . Smoking status: Never Smoker  . Smokeless tobacco: Never Used  Vaping Use  . Vaping Use: Never used  Substance Use Topics  . Alcohol use: Yes    Comment: occasionally  . Drug use: Yes    Types: Marijuana    Comment: last used this morning    Allergies  Allergen Reactions  . Nsaids     Other reaction(s): Anemia, Gastrointestinal hemorrhage, Anemia, Gastrointestinal hemorrhage    Current Meds  Medication Sig  . gabapentin (NEURONTIN) 100 MG capsule Take 1 capsule (100 mg total) by mouth 3 (three) times daily.    BP 114/75   Pulse (!) 108   Ht 6' (1.829 m)   Wt 142 lb (64.4 kg)   BMI 19.26 kg/m   Physical Exam  General appearance: Well-developed well-nourished no gross deformities ectomorphic  Cardiovascular normal pulse and perfusion normal color without edema  Neurologically o sensation loss or deficits or pathologic reflexes  Psychological: Awake alert and oriented x3 mood and affect normal  Skin no lacerations or ulcerations no nodularity no palpable masses, no erythema or nodularity  Musculoskeletal:   Right knee left knee no effusion normal range of motion no pain or tenderness no instability muscle tone normal  Left hip flexion is normal but he is tender over  the left greater trochanter  Right hip flexion improved produces groin pain  He also has tenderness in his lower back central and right and left of midline     MEDICAL DECISION MAKING  A.  Encounter Diagnoses  Name Primary?  . Chronic bilateral low back pain with bilateral sciatica Yes  . Degenerative disc disease, lumbar   . Trochanteric bursitis, left hip     B. DATA ANALYSED:   IMAGING: Interpretation of images: External patient presented with a disc showing 4 views of both knees.  There are no abnormalities seen on either of these x-rays    Orders: X-ray pelvis and lumbar spine  Outside records reviewed: None   C. MANAGEMENT    PT  GABAPENTIN  Procedure note injection for left hip bursitis  Verbal consent was obtained for injection of the  left hip   Timeout was completed to confirm the injection site  The medications used were 40 mg of CELESTONE 6 MG and 1% lidocaine 3 cc  Anesthesia was provided by ethyl chloride and the skin was prepped with alcohol.  After cleaning the skin with alcohol a 25-gauge needle was used to inject the left hip greater trochanteric bursa  Meds ordered this encounter  Medications  . gabapentin (NEURONTIN) 100 MG capsule    Sig: Take 1 capsule (100 mg total) by mouth 3 (three) times daily.    Dispense:  90 capsule    Refill:  2   Encounter Diagnoses  Name Primary?  . Chronic bilateral low back pain with bilateral sciatica Yes  . Degenerative disc disease, lumbar   . Trochanteric bursitis, left hip       Arther Abbott, MD  08/09/2020 9:08 AM

## 2020-09-05 ENCOUNTER — Ambulatory Visit (HOSPITAL_COMMUNITY): Payer: No Typology Code available for payment source | Attending: Orthopedic Surgery | Admitting: Physical Therapy

## 2020-09-07 ENCOUNTER — Ambulatory Visit (INDEPENDENT_AMBULATORY_CARE_PROVIDER_SITE_OTHER): Payer: No Typology Code available for payment source | Admitting: Internal Medicine

## 2020-09-07 ENCOUNTER — Other Ambulatory Visit: Payer: Self-pay | Admitting: *Deleted

## 2020-09-07 ENCOUNTER — Telehealth: Payer: Self-pay | Admitting: *Deleted

## 2020-09-07 ENCOUNTER — Encounter: Payer: Self-pay | Admitting: Internal Medicine

## 2020-09-07 ENCOUNTER — Other Ambulatory Visit: Payer: Self-pay

## 2020-09-07 VITALS — BP 121/82 | HR 88 | Temp 97.9°F | Resp 18 | Ht 72.0 in | Wt 136.0 lb

## 2020-09-07 DIAGNOSIS — M1710 Unilateral primary osteoarthritis, unspecified knee: Secondary | ICD-10-CM

## 2020-09-07 DIAGNOSIS — R634 Abnormal weight loss: Secondary | ICD-10-CM

## 2020-09-07 DIAGNOSIS — Z114 Encounter for screening for human immunodeficiency virus [HIV]: Secondary | ICD-10-CM

## 2020-09-07 DIAGNOSIS — E559 Vitamin D deficiency, unspecified: Secondary | ICD-10-CM

## 2020-09-07 DIAGNOSIS — F5101 Primary insomnia: Secondary | ICD-10-CM

## 2020-09-07 DIAGNOSIS — C61 Malignant neoplasm of prostate: Secondary | ICD-10-CM

## 2020-09-07 DIAGNOSIS — E440 Moderate protein-calorie malnutrition: Secondary | ICD-10-CM | POA: Diagnosis not present

## 2020-09-07 DIAGNOSIS — I10 Essential (primary) hypertension: Secondary | ICD-10-CM

## 2020-09-07 DIAGNOSIS — D649 Anemia, unspecified: Secondary | ICD-10-CM

## 2020-09-07 DIAGNOSIS — R7989 Other specified abnormal findings of blood chemistry: Secondary | ICD-10-CM

## 2020-09-07 DIAGNOSIS — R1115 Cyclical vomiting syndrome unrelated to migraine: Secondary | ICD-10-CM

## 2020-09-07 DIAGNOSIS — Z131 Encounter for screening for diabetes mellitus: Secondary | ICD-10-CM

## 2020-09-07 MED ORDER — MIRTAZAPINE 15 MG PO TABS: 15.0000 mg | ORAL_TABLET | Freq: Every day | ORAL | 2 refills | Status: DC

## 2020-09-07 MED ORDER — MELOXICAM 7.5 MG PO TABS: 7.5000 mg | ORAL_TABLET | Freq: Every day | ORAL | 0 refills | Status: DC

## 2020-09-07 MED ORDER — ENSURE PO POWD: 15.0000 g | Freq: Every day | ORAL | 3 refills | Status: AC

## 2020-09-07 NOTE — Assessment & Plan Note (Signed)
Refilled Meloxicam Advised to follow up with Orthopedic surgeon

## 2020-09-07 NOTE — Assessment & Plan Note (Signed)
Likely in the setting of Marijuana use Avoid Marijuana use - has cut down Zofran prescribed Referred to GI

## 2020-09-07 NOTE — Patient Instructions (Addendum)
Please get CT chest and abdomen done as scheduled.  Please take Meloxicam as needed for knee pain.  Please schedule follow up appointment with Dr Aline Brochure for knee and hip pain.  Please start taking Mirtazepine for sleep and appetite. Avoid skipping any meal. Please take at least 64 ounces of fluid in a day.  Please continue taking other medications as prescribed.  Please bring medications in the next visit.

## 2020-09-07 NOTE — Assessment & Plan Note (Signed)
C/o chronic intermittent watery diarrhea No systemic symptoms like fever, chills Advised to avoid illicit drug use Ensure supplements

## 2020-09-07 NOTE — Telephone Encounter (Signed)
Attempted to let pt know about the CT scheduled at [facility] will need to swing by and pick up contrast and also npo 4 hours before NA NVM

## 2020-09-07 NOTE — Progress Notes (Signed)
Established Patient Office Visit  Subjective:  Patient ID: Jonathan Benitez, male    DOB: [birth year]  Age: 70 y.o. MRN: [id number]  CC:  Chief Complaint  Patient presents with  . Follow-up    Follow up stomach still hurting not getting better and pt is losing lots of weight also his knees are killing     HPI Jonathan Benitez is a 70 year old male with PMH of HTN, GERD, knee arthritis, prostate cancer s/p radiotherapy, and chronic intermittent diarrhea who presents for follow up of chronic medical conditions.  Weight loss: He has lost about 6 lbs since the last visit. Denies any recent fever, chills, URTI, dysuria or hematuria. He has lost appetite, but he tries to eat regularly. Denies any night sweats, but feels tired most of the day.  HTN: BP is well-controlled. Takes medications regularly. Patient denies headache, dizziness, chest pain, dyspnea or palpitations.  Knee and hip pain: Has been having chronic knee and hip pain. Was seen by Orthopedic surgeon recently and steroid injection in the hip was given. He states that he started having similar pain again recently.  Cyclic vomiting and diarrhea: He still c/o intermittent vomiting and diarrhea, has had mild hematochezia sometimes. He has not been able to see GI yet, will refer to his previous GI. He takes Omeprazole for GERD.  Past Medical History:  Diagnosis Date  . GERD (gastroesophageal reflux disease)   . Hepatitis C   . Hypertension   . Prostate CA ([place])   . 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 COLONOSCOPY N/A 03/30/2018   Procedure: COLONOSCOPY;  Surgeon: Rogene Houston, MD;  [facility];  Service: Endoscopy;  Laterality: N/A;  12:45  . [place]  . POLYPECTOMY  03/30/2018   Procedure: POLYPECTOMY;  Surgeon: Rogene Houston, MD;  [facility];  Service: Endoscopy;;  colon  . PROSTATE BIOPSY     positive for  cancer  . TOTAL HIP ARTHROPLASTY Left    [facility] 11/07/17 Dr. Florian Buff    Family History  Problem Relation Age of Onset  . Hypertension Mother   . Hypertension Father   . Stroke Father     Social History   Socioeconomic History  . Marital status: Widowed    Spouse name: Not on file  . Number of children: Not on file  . Years of education: Not on file  . Highest education level: Not on file  Occupational History  . Not on file  Tobacco Use  . Smoking status: Never Smoker  . Smokeless tobacco: Never Used  Vaping Use  . Vaping Use: Never used  Substance and Sexual Activity  . Alcohol use: Yes    Comment: occasionally  . Drug use: Yes    Types: Marijuana    Comment: last used this morning  . Sexual activity: Not on file  Other Topics Concern  . Not on file  Social History Narrative  . Not on file   Social Determinants of Health   Financial Resource Strain: Not on file  Food Insecurity: Not on file  Transportation Needs: Not on file  Physical Activity: Not on file  Stress: Not on file  Social Connections: Not on file  Intimate Partner Violence: Not on file    Outpatient Medications Prior to Visit  Medication Sig Dispense Refill  . alum & mag hydroxide-simeth (MAALOX ADVANCED  MAX ST) 400-400-40 MG/5ML suspension Take 10 mLs by mouth every 6 (six) hours as needed for indigestion. 355 mL 0  . Capsaicin 0.05 % CREA Apply 1 g topically 4 (four) times daily as needed (abdominal discomfort). 57 g 0  . cetirizine (ZYRTEC) 10 MG tablet Take 10 mg by mouth 2 (two) times daily.    . diclofenac Sodium (VOLTAREN) 1 % GEL Apply 2 g topically 4 (four) times daily. 100 g 0  . fluticasone (FLONASE) 50 MCG/ACT nasal spray Place 2 sprays into both nostrils daily as needed for allergies.     Marland Kitchen gabapentin (NEURONTIN) 100 MG capsule Take 1 capsule (100 mg total) by mouth 3 (three) times daily. 90 capsule 2  . lisinopril-hydrochlorothiazide (PRINZIDE,ZESTORETIC) 20-25 MG tablet  Take 1 tablet by mouth daily.     . Menthol-Methyl Salicylate (THERA-GESIC EX) Apply 1 application topically daily as needed (for pain).    Marland Kitchen omeprazole (PRILOSEC) 40 MG capsule Take 1 capsule (40 mg total) by mouth daily. 30 capsule 3  . ondansetron (ZOFRAN ODT) 4 MG disintegrating tablet Take 1 tablet (4 mg total) by mouth every 8 (eight) hours as needed for nausea or vomiting. 20 tablet 0  . potassium chloride SA (KLOR-CON) 20 MEQ tablet Take 2 tablets (40 mEq total) by mouth daily. 6 tablet 0  . sildenafil (REVATIO) 20 MG tablet Take 20 mg by mouth daily as needed (for ED).    Marland Kitchen traMADol (ULTRAM) 50 MG tablet Take 50 mg by mouth 3 (three) times daily as needed for moderate pain.     . Wheat Dextrin (BENEFIBER DRINK MIX) PACK Take 4 g by mouth at bedtime.    . famotidine (PEPCID) 40 MG tablet Take 1 tablet (40 mg total) by mouth daily. 30 tablet 0  . meloxicam (MOBIC) 7.5 MG tablet Take 1 tablet (7.5 mg total) by mouth daily. 30 tablet 0   No facility-administered medications prior to visit.    Allergies  Allergen Reactions  . Nsaids     Other reaction(s): Anemia, Gastrointestinal hemorrhage, Anemia, Gastrointestinal hemorrhage    ROS Review of Systems  Constitutional: Positive for unexpected weight change. Negative for chills and fever.  HENT: Negative for congestion and sore throat.   Eyes: Negative for pain and discharge.  Respiratory: Negative for cough and shortness of breath.   Cardiovascular: Negative for chest pain and palpitations.  Gastrointestinal: Positive for diarrhea, nausea and vomiting. Negative for constipation.  Endocrine: Negative for polydipsia and polyuria.  Genitourinary: Negative for dysuria and hematuria.  Musculoskeletal: Positive for arthralgias. Negative for neck pain and neck stiffness.  Skin: Negative for rash.  Neurological: Negative for dizziness, weakness, numbness and headaches.  Psychiatric/Behavioral: Negative for agitation and behavioral  problems.      Objective:    Physical Exam Vitals reviewed.  Constitutional:      General: He is not in acute distress.    Appearance: He is not diaphoretic.  HENT:     Head: Normocephalic and atraumatic.     Nose: Nose normal.     Mouth/Throat:     Mouth: Mucous membranes are moist.  Eyes:     General: No scleral icterus.    Extraocular Movements: Extraocular movements intact.  Cardiovascular:     Rate and Rhythm: Normal rate and regular rhythm.     Pulses: Normal pulses.     Heart sounds: Normal heart sounds. No murmur heard.   Pulmonary:     Breath sounds: Normal breath sounds. No wheezing or  rales.  Abdominal:     Palpations: Abdomen is soft.     Tenderness: There is no abdominal tenderness. There is no guarding or rebound.  Musculoskeletal:     Cervical back: Neck supple. No tenderness.     Right lower leg: No edema.     Left lower leg: No edema.  Skin:    General: Skin is warm.     Findings: No rash.  Neurological:     General: No focal deficit present.     Mental Status: He is alert and oriented to person, place, and time.     Sensory: No sensory deficit.     Motor: No weakness.  Psychiatric:        Mood and Affect: Mood normal.        Behavior: Behavior normal.     BP 121/82 (BP Location: Right Arm, Patient Position: Sitting, Cuff Size: Normal)   Pulse 88   Temp 97.9 F (36.6 C) (Oral)   Resp 18   Ht 6' (1.829 m)   Wt 136 lb (61.7 kg)   SpO2 99%   BMI 18.44 kg/m  Wt Readings from Last 3 Encounters:  09/07/20 136 lb (61.7 kg)  08/09/20 142 lb (64.4 kg)  06/30/20 144 lb (65.3 kg)     Health Maintenance Due  Topic Date Due  . COVID-19 Vaccine (3 - Pfizer risk 4-dose series) 08/04/2019    There are no preventive care reminders to display for this patient.  No results found for: TSH Lab Results  Component Value Date   WBC 5.6 05/01/2020   HGB 10.6 (L) 05/01/2020   HCT 31.7 (L) 05/01/2020   MCV 99.1 05/01/2020   PLT 166 05/01/2020    Lab Results  Component Value Date   NA 137 05/01/2020   K 3.3 (L) 05/01/2020   CO2 21 (L) 05/01/2020   GLUCOSE 93 05/01/2020   BUN 15 05/01/2020   CREATININE 0.98 05/01/2020   BILITOT 0.5 05/01/2020   ALKPHOS 54 05/01/2020   AST 65 (H) 05/01/2020   ALT 41 05/01/2020   PROT 8.1 05/01/2020   ALBUMIN 3.9 05/01/2020   CALCIUM 8.5 (L) 05/01/2020   ANIONGAP 14 05/01/2020   No results found for: CHOL No results found for: HDL No results found for: LDLCALC No results found for: TRIG No results found for: CHOLHDL No results found for: HGBA1C    Assessment & Plan:   Problem List Items Addressed This Visit      Weight loss - Primary   Unclear etiology Patient has cut down marijuana use, has been trying to eat regularly Considering his h/o prostate ca, will check CT chest, abdomen/pelvis to r/o neoplastic process Ensure supplements for now Started Remeron     Relevant Medications  mirtazapine (REMERON) 15 MG tablet  Nutritional Supplements (ENSURE) POWD  Other Relevant Orders  CT CHEST ABDOMEN PELVIS W CONTRAST  Ambulatory referral to Gastroenterology    Cardiovascular and Mediastinum   HTN (hypertension)    BP Readings from Last 1 Encounters:  09/07/20 121/82   Well-controlled with Lisinopril-HCTZ Counseled for compliance with the medications Advised DASH diet and moderate exercise/walking as tolerated        Digestive   Cyclic vomiting syndrome    Likely in the setting of Marijuana use Avoid Marijuana use - has cut down Zofran prescribed Referred to GI      Relevant Orders   Ambulatory referral to Gastroenterology     Musculoskeletal and Integument   Arthritis of  knee    Refilled Meloxicam Advised to follow up with Orthopedic surgeon      Relevant Medications   meloxicam (MOBIC) 7.5 MG tablet     Genitourinary   Prostate cancer ([place])   Relevant Orders   CT CHEST ABDOMEN PELVIS W CONTRAST     Other   Moderate protein-calorie malnutrition  (HCC)    C/o chronic intermittent watery diarrhea No systemic symptoms like fever, chills Advised to avoid illicit drug use Ensure supplements         Primary insomnia    Started Remeron considering his concurrent weight loss      Relevant Medications   mirtazapine (REMERON) 15 MG tablet      Meds ordered this encounter  Medications  . mirtazapine (REMERON) 15 MG tablet    Sig: Take 1 tablet (15 mg total) by mouth at bedtime.    Dispense:  30 tablet    Refill:  2  . meloxicam (MOBIC) 7.5 MG tablet    Sig: Take 1 tablet (7.5 mg total) by mouth daily.    Dispense:  30 tablet    Refill:  0  . Nutritional Supplements (ENSURE) POWD    Sig: Take 15 g by mouth daily.    Dispense:  397 g    Refill:  3    Follow-up: Return in about 4 months (around 01/07/2021) for Weight check and HTN.    Lindell Spar, MD

## 2020-09-07 NOTE — Assessment & Plan Note (Signed)
BP Readings from Last 1 Encounters:  09/07/20 121/82   Well-controlled with Lisinopril-HCTZ Counseled for compliance with the medications Advised DASH diet and moderate exercise/walking as tolerated

## 2020-09-07 NOTE — Assessment & Plan Note (Signed)
Started Remeron considering his concurrent weight loss

## 2020-09-07 NOTE — Assessment & Plan Note (Signed)
Unclear etiology Patient has cut down marijuana use, has been trying to eat regularly Considering his h/o prostate ca, will check CT chest, abdomen/pelvis to r/o neoplastic process Ensure supplements for now Started Remeron

## 2020-09-08 LAB — CMP14+EGFR
ALT: 32 IU/L (ref 0–44)
AST: 54 IU/L — ABNORMAL HIGH (ref 0–40)
Albumin/Globulin Ratio: 1.2 (ref 1.2–2.2)
Albumin: 4 g/dL (ref 3.8–4.8)
Alkaline Phosphatase: 72 IU/L (ref 44–121)
BUN/Creatinine Ratio: 9 — ABNORMAL LOW (ref 10–24)
BUN: 8 mg/dL (ref 8–27)
Bilirubin Total: 0.8 mg/dL (ref 0.0–1.2)
CO2: 16 mmol/L — ABNORMAL LOW (ref 20–29)
Calcium: 8.8 mg/dL (ref 8.6–10.2)
Chloride: 102 mmol/L (ref 96–106)
Creatinine, Ser: 0.93 mg/dL (ref 0.76–1.27)
Globulin, Total: 3.4 g/dL (ref 1.5–4.5)
Glucose: 93 mg/dL (ref 65–99)
Potassium: 3.7 mmol/L (ref 3.5–5.2)
Sodium: 140 mmol/L (ref 134–144)
Total Protein: 7.4 g/dL (ref 6.0–8.5)
eGFR: 88 mL/min/{1.73_m2} (ref >59)

## 2020-09-08 LAB — LIPID PANEL
Chol/HDL Ratio: 1.8 ratio (ref 0.0–5.0)
Cholesterol, Total: 129 mg/dL (ref 100–199)
HDL: 73 mg/dL (ref >39)
LDL Chol Calc (NIH): 42 mg/dL (ref 0–99)
Triglycerides: 69 mg/dL (ref 0–149)
VLDL Cholesterol Cal: 14 mg/dL (ref 5–40)

## 2020-09-08 LAB — CBC
Hematocrit: 31.9 % — ABNORMAL LOW (ref 37.5–51.0)
Hemoglobin: 10.7 g/dL — ABNORMAL LOW (ref 13.0–17.7)
MCH: 34.1 pg — ABNORMAL HIGH (ref 26.6–33.0)
MCHC: 33.5 g/dL (ref 31.5–35.7)
MCV: 102 fL — ABNORMAL HIGH (ref 79–97)
Platelets: 171 10*3/uL (ref 150–450)
RBC: 3.14 x10E6/uL — ABNORMAL LOW (ref 4.14–5.80)
RDW: 13.1 % (ref 11.6–15.4)
WBC: 3.9 10*3/uL (ref 3.4–10.8)

## 2020-09-08 LAB — TSH+FREE T4
Free T4: 1.1 ng/dL (ref 0.82–1.77)
TSH: 2.64 u[IU]/mL (ref 0.450–4.500)

## 2020-09-08 LAB — VITAMIN D 25 HYDROXY (VIT D DEFICIENCY, FRACTURES): Vit D, 25-Hydroxy: 31.6 ng/mL (ref 30.0–100.0)

## 2020-09-08 LAB — HEMOGLOBIN A1C
Est. average glucose Bld gHb Est-mCnc: 85 mg/dL
Hgb A1c MFr Bld: 4.6 % — ABNORMAL LOW (ref 4.8–5.6)

## 2020-09-08 LAB — PSA: Prostate Specific Ag, Serum: 0.3 ng/mL (ref 0.0–4.0)

## 2020-09-08 LAB — HIV ANTIBODY (ROUTINE TESTING W REFLEX): HIV Screen 4th Generation wRfx: NONREACTIVE

## 2020-09-09 LAB — VITAMIN B12: Vitamin B-12: 454 pg/mL (ref 232–1245)

## 2020-09-09 LAB — SPECIMEN STATUS REPORT

## 2020-09-09 LAB — FOLATE: Folate: 10.5 ng/mL (ref >3.0)

## 2020-10-05 ENCOUNTER — Ambulatory Visit (HOSPITAL_COMMUNITY): Admission: RE | Admit: 2020-10-05 | Payer: No Typology Code available for payment source | Source: Ambulatory Visit

## 2020-10-10 ENCOUNTER — Ambulatory Visit (HOSPITAL_COMMUNITY): Admission: RE | Admit: 2020-10-10 | Payer: No Typology Code available for payment source | Source: Ambulatory Visit

## 2020-11-20 ENCOUNTER — Encounter (INDEPENDENT_AMBULATORY_CARE_PROVIDER_SITE_OTHER): Payer: Self-pay | Admitting: *Deleted

## 2020-11-27 ENCOUNTER — Telehealth: Payer: Self-pay

## 2020-11-27 ENCOUNTER — Ambulatory Visit: Payer: No Typology Code available for payment source | Admitting: Orthopedic Surgery

## 2020-11-27 NOTE — Telephone Encounter (Signed)
[employer] Disability forms  Noted Copied Sleeved

## 2020-12-13 ENCOUNTER — Ambulatory Visit: Payer: No Typology Code available for payment source | Admitting: Internal Medicine

## 2021-01-02 ENCOUNTER — Emergency Department (HOSPITAL_COMMUNITY)
Admission: EM | Admit: 2021-01-02 | Discharge: 2021-01-02 | Disposition: A | Discharge status: Home / Self Care | Payer: No Typology Code available for payment source | Attending: Emergency Medicine | Admitting: Emergency Medicine

## 2021-01-02 ENCOUNTER — Encounter (HOSPITAL_COMMUNITY): Payer: Self-pay | Admitting: Emergency Medicine

## 2021-01-02 ENCOUNTER — Other Ambulatory Visit: Payer: Self-pay

## 2021-01-02 DIAGNOSIS — M791 Myalgia, unspecified site: Secondary | ICD-10-CM | POA: Diagnosis present

## 2021-01-02 DIAGNOSIS — R638 Other symptoms and signs concerning food and fluid intake: Secondary | ICD-10-CM | POA: Diagnosis not present

## 2021-01-02 DIAGNOSIS — Z5321 Procedure and treatment not carried out due to patient leaving prior to being seen by health care provider: Secondary | ICD-10-CM | POA: Diagnosis not present

## 2021-01-02 NOTE — ED Triage Notes (Signed)
Pt c/o generalized body aches and poor po intake for months. Pt states it has gotten worse over last 3 days.

## 2021-01-03 ENCOUNTER — Encounter (HOSPITAL_COMMUNITY): Payer: Self-pay | Admitting: Emergency Medicine

## 2021-01-03 ENCOUNTER — Emergency Department (HOSPITAL_COMMUNITY): Payer: No Typology Code available for payment source

## 2021-01-03 ENCOUNTER — Other Ambulatory Visit: Payer: Self-pay

## 2021-01-03 ENCOUNTER — Emergency Department (HOSPITAL_COMMUNITY)
Admission: EM | Admit: 2021-01-03 | Discharge: 2021-01-03 | Disposition: A | Discharge status: Home / Self Care | Payer: No Typology Code available for payment source | Attending: Emergency Medicine | Admitting: Emergency Medicine

## 2021-01-03 DIAGNOSIS — Z20822 Contact with and (suspected) exposure to covid-19: Secondary | ICD-10-CM | POA: Diagnosis not present

## 2021-01-03 DIAGNOSIS — I10 Essential (primary) hypertension: Secondary | ICD-10-CM | POA: Insufficient documentation

## 2021-01-03 DIAGNOSIS — I7090 Unspecified atherosclerosis: Secondary | ICD-10-CM

## 2021-01-03 DIAGNOSIS — K219 Gastro-esophageal reflux disease without esophagitis: Secondary | ICD-10-CM | POA: Insufficient documentation

## 2021-01-03 DIAGNOSIS — Z8546 Personal history of malignant neoplasm of prostate: Secondary | ICD-10-CM | POA: Insufficient documentation

## 2021-01-03 DIAGNOSIS — Z79899 Other long term (current) drug therapy: Secondary | ICD-10-CM | POA: Insufficient documentation

## 2021-01-03 DIAGNOSIS — Z96642 Presence of left artificial hip joint: Secondary | ICD-10-CM | POA: Insufficient documentation

## 2021-01-03 DIAGNOSIS — R1084 Generalized abdominal pain: Secondary | ICD-10-CM | POA: Insufficient documentation

## 2021-01-03 DIAGNOSIS — I708 Atherosclerosis of other arteries: Secondary | ICD-10-CM

## 2021-01-03 LAB — RESP PANEL BY RT-PCR (FLU A&B, COVID) ARPGX2
Influenza A by PCR: NEGATIVE
Influenza B by PCR: NEGATIVE
SARS Coronavirus 2 by RT PCR: NEGATIVE

## 2021-01-03 LAB — CBC WITH DIFFERENTIAL/PLATELET
Abs Immature Granulocytes: 0.03 10*3/uL (ref 0.00–0.07)
Basophils Absolute: 0 10*3/uL (ref 0.0–0.1)
Basophils Relative: 0 %
Eosinophils Absolute: 0 10*3/uL (ref 0.0–0.5)
Eosinophils Relative: 1 %
HCT: 30.5 % — ABNORMAL LOW (ref 39.0–52.0)
Hemoglobin: 10.6 g/dL — ABNORMAL LOW (ref 13.0–17.0)
Immature Granulocytes: 1 %
Lymphocytes Relative: 20 %
Lymphs Abs: 1.1 10*3/uL (ref 0.7–4.0)
MCH: 33.4 pg (ref 26.0–34.0)
MCHC: 34.8 g/dL (ref 30.0–36.0)
MCV: 96.2 fL (ref 80.0–100.0)
Monocytes Absolute: 0.5 10*3/uL (ref 0.1–1.0)
Monocytes Relative: 10 %
Neutro Abs: 3.7 10*3/uL (ref 1.7–7.7)
Neutrophils Relative %: 68 %
Platelets: 113 10*3/uL — ABNORMAL LOW (ref 150–400)
RBC: 3.17 MIL/uL — ABNORMAL LOW (ref 4.22–5.81)
RDW: 14.1 % (ref 11.5–15.5)
WBC: 5.4 10*3/uL (ref 4.0–10.5)
nRBC: 0 % (ref 0.0–0.2)

## 2021-01-03 LAB — COMPREHENSIVE METABOLIC PANEL
ALT: 31 U/L (ref 0–44)
AST: 41 U/L (ref 15–41)
Albumin: 3.6 g/dL (ref 3.5–5.0)
Alkaline Phosphatase: 58 U/L (ref 38–126)
Anion gap: 11 (ref 5–15)
BUN: 8 mg/dL (ref 8–23)
CO2: 25 mmol/L (ref 22–32)
Calcium: 8.1 mg/dL — ABNORMAL LOW (ref 8.9–10.3)
Chloride: 95 mmol/L — ABNORMAL LOW (ref 98–111)
Creatinine, Ser: 1.07 mg/dL (ref 0.61–1.24)
GFR, Estimated: >60 mL/min (ref >60)
Glucose, Bld: 99 mg/dL (ref 70–99)
Potassium: 2.9 mmol/L — ABNORMAL LOW (ref 3.5–5.1)
Sodium: 131 mmol/L — ABNORMAL LOW (ref 135–145)
Total Bilirubin: 1.4 mg/dL — ABNORMAL HIGH (ref 0.3–1.2)
Total Protein: 7.5 g/dL (ref 6.5–8.1)

## 2021-01-03 LAB — URINALYSIS, ROUTINE W REFLEX MICROSCOPIC
Bilirubin Urine: NEGATIVE
Glucose, UA: NEGATIVE mg/dL
Hgb urine dipstick: NEGATIVE
Ketones, ur: NEGATIVE mg/dL
Leukocytes,Ua: NEGATIVE
Nitrite: NEGATIVE
Protein, ur: NEGATIVE mg/dL
Specific Gravity, Urine: 1.001 — ABNORMAL LOW (ref 1.005–1.030)
pH: 6 (ref 5.0–8.0)

## 2021-01-03 LAB — LIPASE, BLOOD: Lipase: 54 U/L — ABNORMAL HIGH (ref 11–51)

## 2021-01-03 MED ORDER — HYDROMORPHONE HCL 1 MG/ML IJ SOLN
0.5000 mg | Freq: Once | INTRAMUSCULAR | Status: AC
Start: 2021-01-03 — End: 2021-01-03
  Administered 2021-01-03: 0.5 mg via INTRAVENOUS
  Filled 2021-01-03: qty 1

## 2021-01-03 MED ORDER — SODIUM CHLORIDE 0.9 % IV BOLUS
500.0000 mL | Freq: Once | INTRAVENOUS | Status: AC
  Administered 2021-01-03: 500 mL via INTRAVENOUS

## 2021-01-03 MED ORDER — IOHEXOL 350 MG/ML SOLN
75.0000 mL | Freq: Once | INTRAVENOUS | Status: AC | PRN
  Administered 2021-01-03: 75 mL via INTRAVENOUS

## 2021-01-03 MED ORDER — POTASSIUM CHLORIDE 10 MEQ/100ML IV SOLN
10.0000 meq | Freq: Once | INTRAVENOUS | Status: AC
  Administered 2021-01-03: 10 meq via INTRAVENOUS
  Filled 2021-01-03: qty 100

## 2021-01-03 MED ORDER — ONDANSETRON HCL 4 MG/2ML IJ SOLN
4.0000 mg | Freq: Once | INTRAMUSCULAR | Status: AC
  Administered 2021-01-03: 4 mg via INTRAVENOUS
  Filled 2021-01-03: qty 2

## 2021-01-03 MED ORDER — IOHEXOL 350 MG/ML SOLN
100.0000 mL | Freq: Once | INTRAVENOUS | Status: AC | PRN
  Administered 2021-01-03: 75 mL via INTRAVENOUS

## 2021-01-03 NOTE — ED Notes (Signed)
Pt c/o of abdominal pain/ bloating. States he feels like his GERD is flared up. MD made aware.

## 2021-01-03 NOTE — ED Triage Notes (Signed)
Pt c/o generalized body aches and poor intake for the past month.

## 2021-01-03 NOTE — ED Provider Notes (Signed)
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  This discussed with on-call vascular surgeon Dr.Brabham, given there is good collateral flow and the chronic nature of this pain, will recommend follow-up in his clinic within the week.  Recommending cessation of smoking and aspirin at home.  Recommending immediate return for worsening pain fevers or any additional concerns.   Luna Fuse, MD 01/03/21 1806

## 2021-01-03 NOTE — Discharge Instructions (Addendum)
Taking daily aspirin 81 mg.  Follow-up with vascular surgery within clinic within the week.  Return back to the ER if you have worsening pain fevers or any additional concerns.

## 2021-01-03 NOTE — ED Provider Notes (Signed)
[facility] EMERGENCY DEPARTMENT Provider Note   CSN: [id number] Arrival date & time: 01/03/21  0532     History Chief Complaint  Patient presents with   Generalized Body Aches    Jonathan Benitez is a 70 y.o. male.  Patient complains of abdominal pain for 6 months with loose stools and some vomiting.  The history is provided by the patient and medical records. No language interpreter was used.  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    Patient Active Problem List   Diagnosis Date Noted   Weight loss 09/07/2020   Primary insomnia 09/07/2020   Chronic diarrhea 05/19/2020   Arthritis of knee 05/19/2020   Moderate protein-calorie malnutrition (HCC) [id number]   Cyclic vomiting syndrome 05/19/2020   Rectal mass 03/25/2018   Rectal bleeding 03/25/2018   Avascular necrosis of hip, left ([place]) 11/12/2017   Hypoalbuminemia 11/12/2017   Gastroesophageal reflux disease without esophagitis 12/19/2016   Normochromic normocytic anemia 06/28/2012   Prostate cancer ([place]) 06/28/2012   HTN (hypertension) 06/28/2012   Crohn's disease ([place]) 06/28/2012   Alcoholism ([place]) 06/28/2012    Past Surgical History:  Procedure Laterality Date   COLONOSCOPY N/A 03/30/2018   Procedure: COLONOSCOPY;  Surgeon: Rogene Houston, MD;  Location: [facility];  Service: Endoscopy;  Laterality: N/A;  12:45   [place]   POLYPECTOMY  03/30/2018   Procedure: POLYPECTOMY;  Surgeon: Rogene Houston, MD;  Location: [facility];  Service: Endoscopy;;  colon   PROSTATE BIOPSY     positive for cancer   TOTAL HIP ARTHROPLASTY Left    [facility] 11/07/17 Dr. Florian Buff       Family History  Problem Relation Age of Onset   Hypertension Mother    Hypertension Father    Stroke Father     Social History   Tobacco Use   Smoking status: Never   Smokeless tobacco: Never  Vaping Use   Vaping Use: Never used  Substance Use Topics   Alcohol use: Yes    Comment: occasionally   Drug use: Yes    Types: Marijuana    Comment: last used this morning    Home Medications Prior to Admission medications   Medication Sig Start Date End Date Taking? Authorizing Provider  alum & mag hydroxide-simeth (MAALOX ADVANCED MAX ST) 400-400-40 MG/5ML suspension Take 10 mLs by mouth every 6 (six) hours as needed for indigestion. 02/10/20  Yes Long, Wonda Olds, MD  cetirizine (ZYRTEC) 10 MG tablet Take 10 mg by mouth 2 (two) times daily.   Yes [provider]  fluticasone (FLONASE) 50 MCG/ACT nasal spray Place 2 sprays into both nostrils daily as needed for allergies.    Yes [provider]  lisinopril-hydrochlorothiazide (PRINZIDE,ZESTORETIC) 20-25 MG tablet Take 1 tablet by mouth daily.    Yes [provider]  meloxicam (MOBIC) 7.5 MG tablet Take 1 tablet (7.5 mg  total) by mouth daily. 09/07/20  Yes Lindell Spar, MD  Menthol-Methyl Salicylate (THERA-GESIC EX) Apply 1 application topically daily as needed (for pain).   Yes [provider]  mirtazapine (REMERON) 15 MG tablet Take 1 tablet (15 mg total) by mouth at bedtime. 09/07/20  Yes Lindell Spar, MD  omeprazole (PRILOSEC) 40 MG capsule Take 1 capsule (40 mg total) by mouth daily. 06/30/20  Yes Noreene Larsson, NP  potassium chloride SA (KLOR-CON) 20 MEQ tablet Take 2 tablets (40 mEq total) by mouth daily. Patient taking differently: Take 20 mEq by mouth daily. 04/27/20  Yes Elnora Morrison, MD  sildenafil  (REVATIO) 20 MG tablet Take 20 mg by mouth daily as needed (for ED).   Yes [provider]  traMADol (ULTRAM) 50 MG tablet Take 50 mg by mouth 3 (three) times daily as needed for moderate pain.    Yes [provider]  Capsaicin 0.05 % CREA Apply 1 g topically 4 (four) times daily as needed (abdominal discomfort). Patient not taking: No sig reported 05/01/20   Krista Blue L, PA-C  diclofenac Sodium (VOLTAREN) 1 % GEL Apply 2 g topically 4 (four) times daily. Patient not taking: No sig reported 05/01/20   Krista Blue L, PA-C  gabapentin (NEURONTIN) 100 MG capsule Take 1 capsule (100 mg total) by mouth 3 (three) times daily. Patient not taking: No sig reported 08/09/20   Carole Civil, MD  ondansetron (ZOFRAN ODT) 4 MG disintegrating tablet Take 1 tablet (4 mg total) by mouth every 8 (eight) hours as needed for nausea or vomiting. Patient not taking: No sig reported 05/19/20   Lindell Spar, MD  Wheat Dextrin (BENEFIBER DRINK MIX) PACK Take 4 g by mouth at bedtime. Patient not taking: Reported on 01/03/2021 03/30/18   Rogene Houston, MD    Allergies    Nsaids  Review of Systems   Review of Systems  Constitutional:  Negative for appetite change and fatigue.  HENT:  Negative for congestion, ear discharge and sinus pressure.   Eyes:  Negative for discharge.  Respiratory:  Negative for cough.   Cardiovascular:  Negative for chest pain.  Gastrointestinal:  Positive for abdominal pain. Negative for diarrhea.  Genitourinary:  Negative for frequency and hematuria.  Musculoskeletal:  Negative for back pain.  Skin:  Negative for rash.  Neurological:  Negative for seizures and headaches.  Psychiatric/Behavioral:  Negative for hallucinations.    Physical Exam Updated Vital Signs BP (!) 160/99   Pulse 78   Temp 98.5 F (36.9 C) (Oral)   Resp 16   Ht 6' (1.829 m)   Wt 61 kg   SpO2 100%   BMI 18.24 kg/m   Physical Exam Vitals and nursing note reviewed.   Constitutional:      Appearance: He is well-developed.  HENT:     Head: Normocephalic.     Nose: Nose normal.  Eyes:     General: No scleral icterus.    Conjunctiva/sclera: Conjunctivae normal.  Neck:     Thyroid: No thyromegaly.  Cardiovascular:     Rate and Rhythm: Normal rate and regular rhythm.     Heart sounds: No murmur heard.   No friction rub. No gallop.  Pulmonary:     Breath sounds: No stridor. No wheezing or rales.  Chest:     Chest wall: No tenderness.  Abdominal:     General: There is no distension.     Tenderness: There is abdominal tenderness. There is  no rebound.  Musculoskeletal:        General: Normal range of motion.     Cervical back: Neck supple.  Lymphadenopathy:     Cervical: No cervical adenopathy.  Skin:    Findings: No erythema or rash.  Neurological:     Mental Status: He is alert and oriented to person, place, and time.     Motor: No abnormal muscle tone.     Coordination: Coordination normal.  Psychiatric:        Behavior: Behavior normal.    ED Results / Procedures / Treatments   Labs (all labs ordered are listed, but only abnormal results are displayed) Labs Reviewed  CBC WITH DIFFERENTIAL/PLATELET - Abnormal; Notable for the following components:      Result Value   RBC 3.17 (*)    Hemoglobin 10.6 (*)    HCT 30.5 (*)    Platelets 113 (*)    All other components within normal limits  COMPREHENSIVE METABOLIC PANEL - Abnormal; Notable for the following components:   Sodium 131 (*)    Potassium 2.9 (*)    Chloride 95 (*)    Calcium 8.1 (*)    Total Bilirubin 1.4 (*)    All other components within normal limits  LIPASE, BLOOD - Abnormal; Notable for the following components:   Lipase 54 (*)    All other components within normal limits  URINALYSIS, ROUTINE W REFLEX MICROSCOPIC - Abnormal; Notable for the following components:   Color, Urine STRAW (*)    Specific Gravity, Urine 1.001 (*)    All other components within normal  limits  RESP PANEL BY RT-PCR (FLU A&B, COVID) [id number]    EKG None  Radiology CT ABDOMEN PELVIS W CONTRAST  Result Date: 01/03/2021 CLINICAL DATA:  Abdominal pain and bloating for 6 months. EXAM: CT ABDOMEN AND PELVIS WITH CONTRAST TECHNIQUE: Multidetector CT imaging of the abdomen and pelvis was performed using the standard protocol following bolus administration of intravenous contrast. CONTRAST:  74m OMNIPAQUE IOHEXOL 350 MG/ML SOLN COMPARISON:  April 26, 2020. FINDINGS: Lower chest: No acute abnormality. Hepatobiliary: Hepatic steatosis is noted. No gallstones or biliary dilatation is noted. Pancreas: Unremarkable. No pancreatic ductal dilatation or surrounding inflammatory changes. Spleen: Normal in size without focal abnormality. Adrenals/Urinary Tract: Adrenal glands are unremarkable. Kidneys are normal, without renal calculi, focal lesion, or hydronephrosis. Bladder is unremarkable. Stomach/Bowel: The stomach is unremarkable. Diverticulosis of descending and sigmoid colon is noted without inflammation. Dilatation of the cecum and proximal colon is noted. There appears to be focal wall thickening and narrowing of the distal transverse colon; neoplasm can not be excluded. Vascular/Lymphatic: Aortic atherosclerosis. No enlarged abdominal or pelvic lymph nodes. 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 Moderate stenosis is seen involving the origin of the superior mesenteric artery secondary to heavily calcified plaque. Reproductive: Prostate is unremarkable. Other: Status post left inguinal hernia repair. No definite recurrent hernia is noted. No ascites is noted. Musculoskeletal: No acute or significant osseous findings. IMPRESSION: Hepatic steatosis. Focal wall thickening and narrowing of the distal transverse colon is noted with dilatation of the cecum and more proximal colon. Colonic malignancy in this area can not be excluded, and  correlation with colonoscopy is recommended. 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 Aortic Atherosclerosis (ICD10-I70.0). Electronically Signed   By: JMarijo ConceptionM.D.   On: 01/03/2021 13:16  Procedures Procedures   Medications Ordered in ED Medications  sodium chloride 0.9 % bolus 500 mL (500 mLs Intravenous Bolus 01/03/21 1209)  HYDROmorphone (DILAUDID) injection 0.5 mg (0.5 mg Intravenous Given 01/03/21 1210)  ondansetron (ZOFRAN) injection 4 mg (4 mg Intravenous Given 01/03/21 1211)  sodium chloride 0.9 % bolus 500 mL (500 mLs Intravenous Bolus 01/03/21 1209)  potassium chloride 10 mEq in 100 mL IVPB (0 mEq Intravenous Stopped 01/03/21 1412)  iohexol (OMNIPAQUE) 350 MG/ML injection 75 mL (75 mLs Intravenous Contrast Given 01/03/21 1221)    ED Course  I have reviewed the triage vital signs and the nursing notes.  Pertinent labs & imaging results that were available during my care of the patient were reviewed by me and considered in my medical decision making (see chart for details).    MDM Rules/Calculators/A&P                           CT scan shows possible colon cancer and bowel ischemia.  Patient will get a CT angio of the abdomen and results will be discussed with GI.  I have already spoke with GI, dr. Wonda Amis and they are willing to see the patient at 56 tomorrow in their office Final Clinical Impression(s) / ED Diagnoses Final diagnoses:  None    Rx / DC Orders ED Discharge Orders     None        Milton Ferguson, MD 01/05/21 [phone]

## 2021-01-03 NOTE — ED Notes (Signed)
Checked on pt. Pt said that he was having stomach pain. I informed the nurse of his stomach pain.

## 2021-01-03 NOTE — ED Notes (Signed)
Checked on pt to see if he needed anything. Pt said he was good. And collected urine.

## 2021-01-04 ENCOUNTER — Telehealth (INDEPENDENT_AMBULATORY_CARE_PROVIDER_SITE_OTHER): Payer: Self-pay | Admitting: Internal Medicine

## 2021-01-04 ENCOUNTER — Encounter (INDEPENDENT_AMBULATORY_CARE_PROVIDER_SITE_OTHER): Payer: Self-pay | Admitting: Internal Medicine

## 2021-01-04 ENCOUNTER — Ambulatory Visit (INDEPENDENT_AMBULATORY_CARE_PROVIDER_SITE_OTHER): Payer: No Typology Code available for payment source | Admitting: Gastroenterology

## 2021-01-04 ENCOUNTER — Ambulatory Visit (INDEPENDENT_AMBULATORY_CARE_PROVIDER_SITE_OTHER): Payer: No Typology Code available for payment source | Admitting: Internal Medicine

## 2021-01-04 NOTE — Telephone Encounter (Signed)
I was able to reach patient. I also asked Dr. Ronney Asters to review CT films.  He feels patient may have significant stenosis to SMA but flow appears to be satisfactory as celiac trunk and IMA. Therefore will hold off on evaluation by IR. Patient agreeable to come for office visit on Monday next week. Will arrange.

## 2021-01-04 NOTE — Telephone Encounter (Signed)
Patient was seen in emergency room yesterday by Dr. Milton Ferguson contacted me. Reviewed abdominal pelvic CT and recommended CTA abdomen pelvis has yet dense calcification to take off of celiac trunk and SMA.  This study revealed significant stenosis and patent IMA. Regular CT thickening to distal transverse colon but CT abdomen pelvis did not reveal any thickening to the distal transverse colon and instead showed changes of cecal inflammation. Patient was to be seen in the office today but he was no-show. I called his sister Orbie Hurst but there was no answer. Patient needs visceral angiography and stenting if feasible as soon as possible. Will keep trying to reach the patient.

## 2021-01-08 ENCOUNTER — Ambulatory Visit: Payer: No Typology Code available for payment source | Admitting: Internal Medicine

## 2021-01-09 ENCOUNTER — Other Ambulatory Visit: Payer: Self-pay

## 2021-01-09 ENCOUNTER — Ambulatory Visit (INDEPENDENT_AMBULATORY_CARE_PROVIDER_SITE_OTHER): Payer: No Typology Code available for payment source | Admitting: Internal Medicine

## 2021-01-09 ENCOUNTER — Encounter (INDEPENDENT_AMBULATORY_CARE_PROVIDER_SITE_OTHER): Payer: Self-pay | Admitting: Internal Medicine

## 2021-01-09 VITALS — BP 149/96 | HR 87 | Temp 98.2°F | Ht 72.0 in | Wt 143.0 lb

## 2021-01-09 DIAGNOSIS — D649 Anemia, unspecified: Secondary | ICD-10-CM | POA: Diagnosis not present

## 2021-01-09 DIAGNOSIS — R935 Abnormal findings on diagnostic imaging of other abdominal regions, including retroperitoneum: Secondary | ICD-10-CM | POA: Diagnosis not present

## 2021-01-09 NOTE — Progress Notes (Signed)
Presenting complaint;  Follow-up to recent ER visit. Low hemoglobin and abnormal CT.  Database and subjective:  Patient is 70 year old African-American male who has chronic GERD, history of hepatitis C treated through the [place] system with SVR(per patient) who was last seen in our office by Ms. Deberah Castle, NP on 03/25/2028 for iron deficiency anemia and rectal bleeding.  He underwent colonoscopy on 03/30/2018 with removal of single 6 mm tubular adenoma.  He was noted to have very prominent internal and external hemorrhoids as well as diverticuli descending and sigmoid colon.  Patient presented to emergency room on 01/02/2021 for generalized body aches and poor appetite.  He was also complaining of abdominal pain and diarrhea for sporadic vomiting.  He was evaluated by Dr. Milton Ferguson.  He was noted to be anemic with hemoglobin of 10.6 hematocrit of 30.5.  His platelet count was low at 113 K.  Electrolytes were pertinent for serum potassium of 2.9.  His bilirubin was 1.4 but transaminases were normal.  He was given IV fluids and KCl. Abdominal pelvic CT with contrast was obtained revealing fatty liver, focal wall thickening and narrowing to distal transverse colon and dilation of proximal colon.  This study also showed moderate to severe stenosis to origin of celiac trunk and SMA with heavy calcified plaque. Dr. Roderic Palau contacted me for advice.  Given patient's symptoms I recommended CTA abdomen and pelvis.  This study did not reveal wall thickening to distal transverse colon or splenic flexure which showed wall thickening to ascending colon and cecum concerning for colitis.  This study once again showed heavily calcified stenosis to celiac trunk origin but patent patent SMA and IMA.  There was significant calcification in the proximal SMA as well.  This study did not reveal changes of diverticulitis or ascites.  Dr. Trula Slade of vascular surgery was consulted over the phone and he recommended outpatient  follow-up. He had trouble reaching patient for this appointment.  Patient states he feels much better.  He used to drink excessive amount of alcohol.  He stopped drinking liquor about 6 months ago and he has stopped drinking beer as of 6 days ago.  He was drinking at least 40 ounces every day.  He states he has been under a lot of stress for personal reasons.  He stated a lot of stress is coming from his daughter who is 63 years old.  He feels he is reached the point that he is more worried about himself and others.  He has had poor appetite for 6 months but over the last few days he has been eating 3 meals a day.  He has not had any abdominal pain since the ER visit.  His diarrhea has resolved and he is passing formed stools daily.  He is complaining of heartburn.  He states omeprazole at a dose of 40 mg daily is not working.  He is going to call his physician at [facility] so the medication could be changed.  He would like to get it done through the [place] system as it does not cost him anything.  He denies dysphagia hematemesis melena or rectal bleeding. He has history of hepatitis C and he is certain that he was treated through the [place] system and he had SVR. He has never smoked cigarettes. He is divorced.  He lives alone.  He has 3 grownup children in good health.  He has 1 daughter and 2 sons. He has 4 brothers and 1 sister living.  2 brothers died  of CVA 52 was 73 years old and the other 1 was 23.  He states most of his brothers have had hypertension.  1 sister died of uterine cancer at age 42. Mother lived to be 16 and father died of CVA at age 68.   Current Medications: Outpatient Encounter Medications as of 01/09/2021  Medication Sig   cetirizine (ZYRTEC) 10 MG tablet Take 10 mg by mouth 2 (two) times daily.   diclofenac Sodium (VOLTAREN) 1 % GEL Apply 2 g topically 4 (four) times daily.   fluticasone (FLONASE) 50 MCG/ACT nasal spray Place 2 sprays into both nostrils daily as needed for allergies.     gabapentin (NEURONTIN) 100 MG capsule Take 1 capsule (100 mg total) by mouth 3 (three) times daily.   lisinopril-hydrochlorothiazide (PRINZIDE,ZESTORETIC) 20-25 MG tablet Take 1 tablet by mouth daily.    meloxicam (MOBIC) 7.5 MG tablet Take 1 tablet (7.5 mg total) by mouth daily.   Menthol-Methyl Salicylate (THERA-GESIC EX) Apply 1 application topically daily as needed (for pain).   mirtazapine (REMERON) 15 MG tablet Take 1 tablet (15 mg total) by mouth at bedtime.   potassium chloride SA (KLOR-CON) 20 MEQ tablet Take 2 tablets (40 mEq total) by mouth daily. (Patient taking differently: Take 20 mEq by mouth daily.)   sildenafil (REVATIO) 20 MG tablet Take 20 mg by mouth daily as needed (for ED).   traMADol (ULTRAM) 50 MG tablet Take 50 mg by mouth 3 (three) times daily as needed for moderate pain.    alum & mag hydroxide-simeth (MAALOX ADVANCED MAX ST) 400-400-40 MG/5ML suspension Take 10 mLs by mouth every 6 (six) hours as needed for indigestion. (Patient not taking: Reported on 01/09/2021)   Capsaicin 0.05 % CREA Apply 1 g topically 4 (four) times daily as needed (abdominal discomfort). (Patient not taking: No sig reported)   omeprazole (PRILOSEC) 40 MG capsule Take 1 capsule (40 mg total) by mouth daily. (Patient not taking: Reported on 01/09/2021)   ondansetron (ZOFRAN ODT) 4 MG disintegrating tablet Take 1 tablet (4 mg total) by mouth every 8 (eight) hours as needed for nausea or vomiting. (Patient not taking: No sig reported)   Wheat Dextrin (BENEFIBER DRINK MIX) PACK Take 4 g by mouth at bedtime. (Patient not taking: No sig reported)   No facility-administered encounter medications on file as of 01/09/2021.   Past Medical History:  Diagnosis Date   GERD (gastroesophageal reflux disease)    Hepatitis C treated through [place] system according to patient.    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  Past Surgical History:   Procedure Laterality Date   COLONOSCOPY N/A 03/30/2018   Procedure: COLONOSCOPY;  Surgeon: Rogene Houston, MD;  Location[facility];  Service: Endoscopy;  Laterality: N/A;  12:45   [place]   POLYPECTOMY  03/30/2018   Procedure: POLYPECTOMY;  Surgeon: Rogene Houston, MD;  Location: [facility];  Service: Endoscopy;;  colon   PROSTATE BIOPSY     positive for cancer   TOTAL HIP ARTHROPLASTY Left    [facility] 11/07/17 Dr. Florian Buff    Objective: Blood pressure (!) 149/96, pulse 87, temperature 98.2 F (36.8 C), temperature source Oral, height 6' (1.829 m), weight 143 lb (64.9 kg). Patient is alert and in no acute distress. He does not have tremors. Conjunctiva is pink. Sclera is nonicteric Oropharyngeal  mucosa is normal. No neck masses or thyromegaly noted. Cardiac exam with regular rhythm normal S1 and S2. No murmur or gallop noted. Lungs are clear to auscultation. Abdomen is symmetrical.  Bowel sounds are normal.  Abdomen is soft with mild tenderness in epigastric region and right lower quadrant there is no guarding.  No organomegaly or masses. No LE edema or clubbing noted.  Labs/studies Results:   CBC Latest Ref Rng & Units 01/03/2021 09/07/2020 05/01/2020  WBC 4.0 - 10.5 K/uL 5.4 3.9 5.6  Hemoglobin 13.0 - 17.0 g/dL 10.6(L) 10.7(L) 10.6(L)  Hematocrit 39.0 - 52.0 % 30.5(L) 31.9(L) 31.7(L)  Platelets 150 - 400 K/uL 113(L) 171 166    CMP Latest Ref Rng & Units 01/03/2021 09/07/2020 05/01/2020  Glucose 70 - 99 mg/dL 99 93 93  BUN 8 - 23 mg/dL $Remove'8 8 15  'PcuOsHO$ Creatinine 0.61 - 1.24 mg/dL 1.07 0.93 0.98  Sodium 135 - 145 mmol/L 131(L) 140 137  Potassium 3.5 - 5.1 mmol/L 2.9(L) 3.7 3.3(L)  Chloride 98 - 111 mmol/L 95(L) 102 102  CO2 22 - 32 mmol/L 25 16(L) 21(L)  Calcium 8.9 - 10.3 mg/dL 8.1(L) 8.8 8.5(L)  Total Protein 6.5 - 8.1 g/dL 7.5 7.4 8.1  Total Bilirubin 0.3 - 1.2 mg/dL 1.4(H) 0.8 0.5  Alkaline Phos 38 - 126 U/L 58 72 54  AST 15 - 41 U/L 41  54(H) 65(H)  ALT 0 - 44 U/L 31 32 41    Hepatic Function Latest Ref Rng & Units 01/03/2021 09/07/2020 05/01/2020  Total Protein 6.5 - 8.1 g/dL 7.5 7.4 8.1  Albumin 3.5 - 5.0 g/dL 3.6 4.0 3.9  AST 15 - 41 U/L 41 54(H) 65(H)  ALT 0 - 44 U/L 31 32 41  Alk Phosphatase 38 - 126 U/L 58 72 54  Total Bilirubin 0.3 - 1.2 mg/dL 1.4(H) 0.8 0.5    Lab data from 09/07/2020 B12 level 454  Folate level 10.5.  Assessment:  #1.  Abdominal pain and diarrhea has resolved since he quit drinking alcohol.  He had abdominal pelvic CT with contrast as well as CTA abdomen and pelvis on 01/03/2021.  Wall thickening in distal transverse colon only seen on first CT and therefore would appear to be spasm.  However he has changes of proximal colitis and second CT which warrant further investigation.  I am concerned that he may have ischemic injury.    #2.  Celiac trunk and SMA stenosis.  IMA is patent.  He does not have typical symptoms of abdominal angina.  Acute symptoms have resolved since he quit drinking alcohol.  Nevertheless he is at risk of progressive disease and should follow-up with Dr. Georgia Lopes of vascular surgery.  #3.  History of hepatitis C.  Recent transaminases normal.  Patient tells me that he has been treated at the [facility] clinic with SVR.  Would like to know when he was treated.  #4.  Anemia.  He has history of iron deficiency anemia.  B12 and folate levels were normal 4 months ago.  Will check iron studies.  #5.  Thrombocytopenia.  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  We will follow.  #6 GERD.  Omeprazole is not working.  He is to contact his physician at [facility] for PPI to be changed.  Plan:  Patient will go to the lab for CBC, serum iron TIBC and ferritin levels. Would recommend diagnostic colonoscopy.  He would like to get it authorized by the [place] system.  Patient advised to obtain information as to timing of hep C therapy and follow-up blood test  results. Patient was given Dr. Stephens Shire office number so they can call and make an appointment. Date of office visit to be determined.

## 2021-01-09 NOTE — Progress Notes (Signed)
error 

## 2021-01-09 NOTE — Patient Instructions (Signed)
Physician will call with results of blood work.  Dr. Trula Slade VE:[phone]

## 2021-01-10 ENCOUNTER — Other Ambulatory Visit (HOSPITAL_COMMUNITY)
Admission: RE | Admit: 2021-01-10 | Discharge: 2021-01-10 | Disposition: A | Discharge status: Home / Self Care | Payer: No Typology Code available for payment source | Source: Ambulatory Visit | Attending: Internal Medicine | Admitting: Internal Medicine

## 2021-01-10 DIAGNOSIS — D649 Anemia, unspecified: Secondary | ICD-10-CM | POA: Insufficient documentation

## 2021-01-10 LAB — IRON AND TIBC
Iron: 83 ug/dL (ref 45–182)
Saturation Ratios: 32 % (ref 17.9–39.5)
TIBC: 262 ug/dL (ref 250–450)
UIBC: 179 ug/dL

## 2021-01-10 LAB — CBC
HCT: 29 % — ABNORMAL LOW (ref 39.0–52.0)
Hemoglobin: 9.6 g/dL — ABNORMAL LOW (ref 13.0–17.0)
MCH: 33.9 pg (ref 26.0–34.0)
MCHC: 33.1 g/dL (ref 30.0–36.0)
MCV: 102.5 fL — ABNORMAL HIGH (ref 80.0–100.0)
Platelets: 222 10*3/uL (ref 150–400)
RBC: 2.83 MIL/uL — ABNORMAL LOW (ref 4.22–5.81)
RDW: 14.8 % (ref 11.5–15.5)
WBC: 3.9 10*3/uL — ABNORMAL LOW (ref 4.0–10.5)
nRBC: 0 % (ref 0.0–0.2)

## 2021-01-10 LAB — FERRITIN: Ferritin: 99 ng/mL (ref 24–336)

## 2021-01-22 ENCOUNTER — Emergency Department (HOSPITAL_COMMUNITY)
Admission: EM | Admit: 2021-01-22 | Discharge: 2021-01-22 | Disposition: A | Discharge status: Home / Self Care | Payer: No Typology Code available for payment source | Attending: Emergency Medicine | Admitting: Emergency Medicine

## 2021-01-22 ENCOUNTER — Encounter (HOSPITAL_COMMUNITY): Payer: Self-pay

## 2021-01-22 ENCOUNTER — Other Ambulatory Visit: Payer: Self-pay

## 2021-01-22 DIAGNOSIS — Y908 Blood alcohol level of 240 mg/100 ml or more: Secondary | ICD-10-CM | POA: Insufficient documentation

## 2021-01-22 DIAGNOSIS — F10929 Alcohol use, unspecified with intoxication, unspecified: Secondary | ICD-10-CM

## 2021-01-22 DIAGNOSIS — F10129 Alcohol abuse with intoxication, unspecified: Secondary | ICD-10-CM | POA: Diagnosis not present

## 2021-01-22 DIAGNOSIS — Z96642 Presence of left artificial hip joint: Secondary | ICD-10-CM | POA: Insufficient documentation

## 2021-01-22 DIAGNOSIS — I10 Essential (primary) hypertension: Secondary | ICD-10-CM | POA: Insufficient documentation

## 2021-01-22 DIAGNOSIS — Z8546 Personal history of malignant neoplasm of prostate: Secondary | ICD-10-CM | POA: Diagnosis not present

## 2021-01-22 DIAGNOSIS — Z79899 Other long term (current) drug therapy: Secondary | ICD-10-CM | POA: Insufficient documentation

## 2021-01-22 LAB — COMPREHENSIVE METABOLIC PANEL
ALT: 27 U/L (ref 0–44)
AST: 66 U/L — ABNORMAL HIGH (ref 15–41)
Albumin: 4 g/dL (ref 3.5–5.0)
Alkaline Phosphatase: 64 U/L (ref 38–126)
Anion gap: 14 (ref 5–15)
BUN: 7 mg/dL — ABNORMAL LOW (ref 8–23)
CO2: 20 mmol/L — ABNORMAL LOW (ref 22–32)
Calcium: 8.2 mg/dL — ABNORMAL LOW (ref 8.9–10.3)
Chloride: 107 mmol/L (ref 98–111)
Creatinine, Ser: 0.81 mg/dL (ref 0.61–1.24)
GFR, Estimated: >60 mL/min (ref >60)
Glucose, Bld: 90 mg/dL (ref 70–99)
Potassium: 4.1 mmol/L (ref 3.5–5.1)
Sodium: 141 mmol/L (ref 135–145)
Total Bilirubin: 1 mg/dL (ref 0.3–1.2)
Total Protein: 8 g/dL (ref 6.5–8.1)

## 2021-01-22 LAB — CBC WITH DIFFERENTIAL/PLATELET
Abs Immature Granulocytes: 0.02 10*3/uL (ref 0.00–0.07)
Basophils Absolute: 0 10*3/uL (ref 0.0–0.1)
Basophils Relative: 1 %
Eosinophils Absolute: 0 10*3/uL (ref 0.0–0.5)
Eosinophils Relative: 1 %
HCT: 31.1 % — ABNORMAL LOW (ref 39.0–52.0)
Hemoglobin: 10.4 g/dL — ABNORMAL LOW (ref 13.0–17.0)
Immature Granulocytes: 1 %
Lymphocytes Relative: 37 %
Lymphs Abs: 1.3 10*3/uL (ref 0.7–4.0)
MCH: 32.7 pg (ref 26.0–34.0)
MCHC: 33.4 g/dL (ref 30.0–36.0)
MCV: 97.8 fL (ref 80.0–100.0)
Monocytes Absolute: 0.2 10*3/uL (ref 0.1–1.0)
Monocytes Relative: 5 %
Neutro Abs: 2 10*3/uL (ref 1.7–7.7)
Neutrophils Relative %: 55 %
Platelets: 122 10*3/uL — ABNORMAL LOW (ref 150–400)
RBC: 3.18 MIL/uL — ABNORMAL LOW (ref 4.22–5.81)
RDW: 15 % (ref 11.5–15.5)
WBC: 3.6 10*3/uL — ABNORMAL LOW (ref 4.0–10.5)
nRBC: 0 % (ref 0.0–0.2)

## 2021-01-22 LAB — ETHANOL: Alcohol, Ethyl (B): 337 mg/dL (ref <10)

## 2021-01-22 NOTE — ED Triage Notes (Addendum)
Pt presents to ED via [place] [employer]. Pt failed breathalyzer and needs medical clearance to go to jail. Pt denies SI/HI

## 2021-01-22 NOTE — Discharge Instructions (Addendum)
Please refrain from heavy alcohol use. Do not drive while intoxicated  - you put yours and other's lives in jeopardy by doing so. Listed below are some alcohol sobriety resources.  Substance Abuse Treatment Programs  Intensive Outpatient Programs [facility]     [address] [place], [place]       [facility] [place], [place]  [place] [facility]     (Inpatient and outpatient)     [address]           [place] [phone] (Suboxone and Methadone)  [place], [place] [address]      [place] [address] [phone] [place], [place]  [facility] [place] (Outpatient/Inpatient, Chemical)    (insurance only) [phone]             [facility] ([place]) [place], [place]     [facility]     [address]     [place], [place]       [facility] (for caregivers and family) [phone] [address] Kristeen Mans. [place], [place]      Residential Treatment Programs [facility]      [address] [place], [place] [address]  [phone]       [facility] [address] [place], [place] [address] [phone]  [facility] [place]        [phone]       [facility] [place] [phone]  [facility] ([place]             [place], [place] or [place], [address] [phone]  [facility]    [address]      [place], [place]       [facility] [address] [place], [place]  [facility]   [address] [place], [place] [address]     [phone]      Admissions: 8am-3pm M-F  [facility] (RTS) [address] [place], [place]  BATS Program: Residential Program [phone] Days)   [place], [place] or [phone]     ADAT[facility]: [facility] [place], [place] (Walk in Hours over the weekend or by referral)  [facility], [place], [place] [address] [phone]  Crisis Mobile: [facility]:  [phone] (for crisis response 24 hours a day) [facility] Hotline:      [phone] Outpatient Psychiatry and Counseling  [facility]: Mobile Crisis Management 24 hours:  [phone]  [facility] of the [employer] sliding scale fee and walk in schedule: M-F 8am-12pm/1pm-3pm [place], [place] [address] [place], [place] [address] [phone]  [facility] ([facility] as [facility] [employer])- new patient walk-in appointments available Monday - Friday 8am -3pm.          [address] [place], [place] [address] [phone] or crisis line- [facility]/ Intensive Outpatient Therapy Program [place], [place] [address] [place]      [phone] [address] [place], [place] [address]                 [place]   [facility] [phone]. [place], [place] [address]   [employer] of Care          [address] Johnette Abraham  [place], [place] [address]       [phone]  [place], Naomi [place]ter, [place] [address] [phone]  [facility]    [address] [place], [place]  [address]     [place], [place] Joycelyn Man     [place] [address]     [phone]       [facility] [place] [address]  [facility]     [address], [place], [place], [place] [address] [place]     [address]     [place], [place] [address]     [phone]       [facility] [address] [place], [place] [address] [phone] Resources for Temporary Residential Assistance/Crisis [facility]) M-F 8am-3pm   [address], [place] [address]   [phone] Services include: laundry, barbering, support groups, case management, phone  & computer access, showers, AA/NA mtgs, mental health/substance abuse nurse, job skills class, disability information, VA assistance, spiritual classes, etc.   [facility]OMEL[facility]   [address], [place]     [place] (women and children)       [place]. [place], [place] [address] [phone] [email or web address] for application and process Application Required  [facility]   [address]    [place] [address]     [phone]                    [place] [address] [place], [place] [address] [id number] application appt.)  Application Required  [employer] (women only)    [place], [place] [address]     [phone]      Intake starts 6pm daily Need valid ID, SSC, & Police report [employer] [address] [place], [place] [id number] Application Required  [employer] (men only)     [place].       [place], [place]       [place] (Pregnant women only) [address] [employer], [place]  [facility]      [address]i [address].      [place], [place] [address]     [phone]             [facility] [address] [place], [place] 90 day commitment/SA/Application process  [facility](men only)     [address]     [place], [place]       Check-in at [facility] [address] [place], [place] [address] [phone] Men/Women/Women and Children must be there by 7 pm  [place], [place]
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
## 2021-02-14 ENCOUNTER — Ambulatory Visit (INDEPENDENT_AMBULATORY_CARE_PROVIDER_SITE_OTHER): Payer: No Typology Code available for payment source | Admitting: Internal Medicine

## 2021-02-14 ENCOUNTER — Telehealth (INDEPENDENT_AMBULATORY_CARE_PROVIDER_SITE_OTHER): Payer: Self-pay | Admitting: Internal Medicine

## 2021-02-14 ENCOUNTER — Other Ambulatory Visit: Payer: Self-pay

## 2021-02-14 ENCOUNTER — Encounter: Payer: Self-pay | Admitting: Internal Medicine

## 2021-02-14 VITALS — BP 131/80 | HR 93 | Temp 98.0°F | Resp 18 | Ht 72.0 in | Wt 158.0 lb

## 2021-02-14 DIAGNOSIS — D500 Iron deficiency anemia secondary to blood loss (chronic): Secondary | ICD-10-CM | POA: Diagnosis not present

## 2021-02-14 DIAGNOSIS — R1115 Cyclical vomiting syndrome unrelated to migraine: Secondary | ICD-10-CM | POA: Diagnosis not present

## 2021-02-14 DIAGNOSIS — M21611 Bunion of right foot: Secondary | ICD-10-CM

## 2021-02-14 DIAGNOSIS — E440 Moderate protein-calorie malnutrition: Secondary | ICD-10-CM

## 2021-02-14 DIAGNOSIS — I10 Essential (primary) hypertension: Secondary | ICD-10-CM | POA: Diagnosis not present

## 2021-02-14 DIAGNOSIS — K219 Gastro-esophageal reflux disease without esophagitis: Secondary | ICD-10-CM

## 2021-02-14 DIAGNOSIS — M542 Cervicalgia: Secondary | ICD-10-CM | POA: Diagnosis not present

## 2021-02-14 MED ORDER — CYCLOBENZAPRINE HCL 5 MG PO TABS: 5.0000 mg | ORAL_TABLET | Freq: Three times a day (TID) | ORAL | 0 refills | Status: DC | PRN

## 2021-02-14 NOTE — Patient Instructions (Addendum)
Please check with GI for colonoscopy.  Please continue to take medications as prescribed.  Please contact Orthopedic surgeon's office for injections for joint pain.

## 2021-02-14 NOTE — Assessment & Plan Note (Signed)
On Pepcid 
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
## 2021-02-14 NOTE — Telephone Encounter (Signed)
Patient came by office after having an appointment with Dr Posey Pronto - stated he needs to schedule a colonoscopy - was seen by Dr Laural Golden on 8/30 - looks like Dr Laural Golden requested patient to have a colonoscopy - he is covered under the V.A - looks like the referral will cover - please advise - ph# [phone]

## 2021-02-14 NOTE — Assessment & Plan Note (Addendum)
Weight has improved now No systemic symptoms like fever, chills Advised to avoid illicit drug use Continue Remeron Ensure supplements

## 2021-02-14 NOTE — Assessment & Plan Note (Signed)
Could be inflamed bunion Gout could be possible as he has a history of alcohol abuse Advised to contact if swelling recurs Advised to wear soft and wide based shoes Avoid alcohol use

## 2021-02-14 NOTE — Assessment & Plan Note (Addendum)
Needs to take iron supplement Had recent GI visit for possible colonic wall thickening noted on CT abdomen - needs colonoscopy, advised to f/u with GI
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
## 2021-02-14 NOTE — Assessment & Plan Note (Signed)
BP Readings from Last 1 Encounters:  02/14/21 131/80   Well-controlled with Lisinopril-HCTZ Counseled for compliance with the medications Advised DASH diet and moderate exercise/walking as tolerated

## 2021-02-14 NOTE — Assessment & Plan Note (Signed)
Likely in the setting of Marijuana use Avoid Marijuana use - has cut down 

## 2021-02-22 ENCOUNTER — Encounter (INDEPENDENT_AMBULATORY_CARE_PROVIDER_SITE_OTHER): Payer: Self-pay

## 2021-02-22 ENCOUNTER — Other Ambulatory Visit (INDEPENDENT_AMBULATORY_CARE_PROVIDER_SITE_OTHER): Payer: Self-pay

## 2021-02-22 DIAGNOSIS — D649 Anemia, unspecified: Secondary | ICD-10-CM

## 2021-02-23 ENCOUNTER — Encounter (INDEPENDENT_AMBULATORY_CARE_PROVIDER_SITE_OTHER): Payer: Self-pay

## 2021-03-01 ENCOUNTER — Other Ambulatory Visit: Payer: Self-pay | Admitting: Internal Medicine

## 2021-03-01 ENCOUNTER — Telehealth: Payer: Self-pay | Admitting: Internal Medicine

## 2021-03-01 DIAGNOSIS — M542 Cervicalgia: Secondary | ICD-10-CM

## 2021-03-01 MED ORDER — CYCLOBENZAPRINE HCL 5 MG PO TABS: 5.0000 mg | ORAL_TABLET | Freq: Three times a day (TID) | ORAL | 2 refills | Status: DC | PRN

## 2021-03-01 NOTE — Telephone Encounter (Signed)
Patient is requesting refill on cyclobenzaprine. Please send in to pharmacy if you are ok with refill

## 2021-03-01 NOTE — Telephone Encounter (Signed)
Pt called in for refills on muscle relaxer

## 2021-03-02 ENCOUNTER — Encounter (HOSPITAL_COMMUNITY)
Admission: RE | Admit: 2021-03-02 | Discharge: 2021-03-02 | Disposition: A | Discharge status: Home / Self Care | Payer: No Typology Code available for payment source | Source: Ambulatory Visit | Attending: Internal Medicine | Admitting: Internal Medicine

## 2021-03-02 ENCOUNTER — Encounter (HOSPITAL_COMMUNITY): Payer: Self-pay

## 2021-03-02 NOTE — Patient Instructions (Signed)
   Your procedure is scheduled on: 03/07/2021  Report to [facility] at     11:30 AM.  Call this number if you have problems the morning of surgery: [phone]   Remember:              Follow Directions on the letter you received from Your Physician's office regarding the Bowel Prep              No Smoking the day of Procedure :   Take these medicines the morning of surgery with A SIP OF WATER: Zyrtec, flexeril, flonase and omeprazole   Do not wear jewelry, make-up or nail polish.    Do not bring valuables to the hospital.  Contacts, dentures or bridgework may not be worn into surgery.  .   Patients discharged the day of surgery will not be allowed to drive home.     Colonoscopy, Adult, Care After This sheet gives you information about how to care for yourself after your procedure. Your health care provider may also give you more specific instructions. If you have problems or questions, contact your health care provider. What can I expect after the procedure? After the procedure, it is common to have: A small amount of blood in your stool for 24 hours after the procedure. Some gas. Mild abdominal cramping or bloating.  Follow these instructions at home: General instructions  For the first 24 hours after the procedure: Do not drive or use machinery. Do not sign important documents. Do not drink alcohol. Do your regular daily activities at a slower pace than normal. Eat soft, easy-to-digest foods. Rest often. Take over-the-counter or prescription medicines only as told by your health care provider. It is up to you to get the results of your procedure. Ask your health care provider, or the department performing the procedure, when your results will be ready. Relieving cramping and bloating Try walking around when you have cramps or feel bloated. Apply heat to your abdomen as told by your health care provider. Use a heat source that your health care provider recommends, such  as a moist heat pack or a heating pad. Place a towel between your skin and the heat source. Leave the heat on for 20-30 minutes. Remove the heat if your skin turns bright red. This is especially important if you are unable to feel pain, heat, or cold. You may have a greater risk of getting burned. Eating and drinking Drink enough fluid to keep your urine clear or pale yellow. Resume your normal diet as instructed by your health care provider. Avoid heavy or fried foods that are hard to digest. Avoid drinking alcohol for as long as instructed by your health care provider. Contact a health care provider if: You have blood in your stool 2-3 days after the procedure. Get help right away if: You have more than a small spotting of blood in your stool. You pass large blood clots in your stool. Your abdomen is swollen. You have nausea or vomiting. You have a fever. You have increasing abdominal pain that is not relieved with medicine. This information is not intended to replace advice given to you by your health care provider. Make sure you discuss any questions you have with your health care provider. Document Released: 12/12/2003 Document Revised: 01/22/2016 Document Reviewed: 07/11/2015 Elsevier Interactive Patient Education  [employer].

## 2021-03-07 ENCOUNTER — Ambulatory Visit (HOSPITAL_COMMUNITY)
Admission: RE | Admit: 2021-03-07 | Payer: No Typology Code available for payment source | Source: Home / Self Care | Admitting: Internal Medicine

## 2021-03-07 ENCOUNTER — Encounter (HOSPITAL_COMMUNITY): Admission: RE | Payer: Self-pay | Source: Home / Self Care

## 2021-03-07 SURGERY — COLONOSCOPY WITH PROPOFOL
Anesthesia: Monitor Anesthesia Care

## 2021-03-21 ENCOUNTER — Encounter (INDEPENDENT_AMBULATORY_CARE_PROVIDER_SITE_OTHER): Payer: Self-pay

## 2021-03-21 ENCOUNTER — Other Ambulatory Visit: Payer: Self-pay

## 2021-03-21 ENCOUNTER — Other Ambulatory Visit (INDEPENDENT_AMBULATORY_CARE_PROVIDER_SITE_OTHER): Payer: Self-pay

## 2021-03-22 ENCOUNTER — Encounter (INDEPENDENT_AMBULATORY_CARE_PROVIDER_SITE_OTHER): Payer: Self-pay

## 2021-03-26 ENCOUNTER — Ambulatory Visit (INDEPENDENT_AMBULATORY_CARE_PROVIDER_SITE_OTHER): Payer: No Typology Code available for payment source

## 2021-03-26 ENCOUNTER — Other Ambulatory Visit: Payer: Self-pay

## 2021-03-26 DIAGNOSIS — Z0000 Encounter for general adult medical examination without abnormal findings: Secondary | ICD-10-CM | POA: Diagnosis not present

## 2021-03-26 NOTE — Progress Notes (Signed)
Subjective:   Jonathan Benitez is a 70 y.o. male who presents for Medicare Annual/Subsequent preventive examination.  I connected with  Jonathan Benitez on 03/26/21 by a telephone enabled telemedicine application and verified that I am speaking with the correct person using two identifiers.   I discussed the limitations of evaluation and management by telemedicine. The patient expressed understanding and agreed to proceed.    Review of Systems     Cardiac Risk Factors include: hypertension;advanced age (>58men, >72 women)     Objective:    Today's Vitals   03/26/21 0855  PainSc: 0-No pain   There is no height or weight on file to calculate BMI.  Advanced Directives 03/26/2021 01/22/2021 01/03/2021 01/02/2021 05/01/2020 04/26/2020 02/10/2020  Does Patient Have a Medical Advance Directive? No No No No No No No  Type of Advance Directive - - - - - - -  Does patient want to make changes to medical advance directive? - - - - - - -  Would patient like information on creating a medical advance directive? No - Patient declined - No - Patient declined No - Patient declined - - -  Pre-existing out of facility DNR order (yellow form or pink MOST form) - - - - - - -    Current Medications (verified) Outpatient Encounter Medications as of 03/26/2021  Medication Sig   allopurinol (ZYLOPRIM) 100 MG tablet Take 100 mg by mouth daily.   cetirizine (ZYRTEC) 10 MG tablet Take 10 mg by mouth 2 (two) times daily.   cyclobenzaprine (FLEXERIL) 5 MG tablet Take 1 tablet (5 mg total) by mouth 3 (three) times daily as needed for muscle spasms.   diclofenac Sodium (VOLTAREN) 1 % GEL Apply 2 g topically 4 (four) times daily.   fluticasone (FLONASE) 50 MCG/ACT nasal spray Place 2 sprays into both nostrils daily as needed for allergies.    lisinopril (ZESTRIL) 20 MG tablet Take 20 mg by mouth daily.   lisinopril-hydrochlorothiazide (PRINZIDE,ZESTORETIC) 20-25 MG tablet Take 1 tablet by mouth daily.     mirtazapine (REMERON) 15 MG tablet Take 1 tablet (15 mg total) by mouth at bedtime.   omeprazole (PRILOSEC) 40 MG capsule Take 1 capsule (40 mg total) by mouth daily.   potassium chloride SA (KLOR-CON) 20 MEQ tablet Take 2 tablets (40 mEq total) by mouth daily. (Patient taking differently: Take 20 mEq by mouth 2 (two) times daily.)   traMADol (ULTRAM) 50 MG tablet Take 50 mg by mouth 3 (three) times daily as needed for moderate pain.   No facility-administered encounter medications on file as of 03/26/2021.    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 Florian Buff   Family History  Problem Relation Age of Onset  Hypertension Mother    Hypertension Father    Stroke Father    Social History   Socioeconomic History   Marital status: Widowed    Spouse name: Not on file   Number of children: Not on file   Years of education: Not on file   Highest education level: Not on file  Occupational History   Not on file  Tobacco Use   Smoking status: Never   Smokeless tobacco: Never  Vaping Use   Vaping Use: Never used  Substance and Sexual Activity   Alcohol use: Yes    Comment: occasionally   Drug use: Yes    Types: Marijuana    Comment: last used this morning   Sexual activity: Not on file  Other Topics Concern   Not on file   Social History Narrative   Not on file   Social Determinants of Health   Financial Resource Strain: Medium Risk   Difficulty of Paying Living Expenses: Somewhat hard  Food Insecurity: Food Insecurity Present   Worried About Running Out of Food in the Last Year: Sometimes true   Ran Out of Food in the Last Year: Sometimes true  Transportation Needs: Unmet Transportation Needs   Lack of Transportation (Medical): Yes   Lack of Transportation (Non-Medical): No  Physical Activity: Insufficiently Active   Days of Exercise per Week: 7 days   Minutes of Exercise per Session: 20 min  Stress: No Stress Concern Present   Feeling of Stress : Not at all  Social Connections: Socially Isolated   Frequency of Communication with Friends and Family: More than three times a week   Frequency of Social Gatherings with Friends and Family: More than three times a week   Attends Religious Services: Never   Marine scientist or Organizations: No   Attends Music therapist: Never   Marital Status: Divorced    Tobacco Counseling Counseling given: Not Answered   Clinical Intake:  Pre-visit preparation completed: No  Pain : No/denies pain Pain Score: 0-No pain     Nutritional Status: BMI of 19-24  Normal Diabetes: No  How often do you need to have someone help you when you read instructions, pamphlets, or other written materials from your doctor or pharmacy?: 1 - Never What is the last grade level you completed in school?: college  Diabetic?no  Interpreter Needed?: No      Activities of Daily Living In your present state of health, do you have any difficulty performing the following activities: 03/26/2021 09/07/2020  Hearing? N N  Vision? N N  Difficulty concentrating or making decisions? N N  Walking or climbing stairs? N N  Dressing or bathing? N N  Doing errands, shopping? N N  Preparing Food and eating ? N -  Using the Toilet? N -  In the past six months, have  you accidently leaked urine? N -  Do you have problems with loss of bowel control? N -  Managing your Medications? N -  Managing your Finances? N -  Housekeeping or managing your Housekeeping? N -  Some recent data might be hidden    Patient Care Team: Lindell Spar, MD as PCP - General (Internal Medicine)  Indicate any recent Medical Services you may have received from other than [facility] providers in the past year (date may be approximate).     Assessment:   This is a routine wellness examination for [place].  Hearing/Vision screen No results found.  Dietary issues and  exercise activities discussed: Current Exercise Habits: Home exercise routine, Type of exercise: walking, Time (Minutes): 20, Frequency (Times/Week): 7, Weekly Exercise (Minutes/Week): 140, Intensity: Mild, Exercise limited by: orthopedic condition(s)   Goals Addressed             This Visit's Progress    DIET - EAT MORE FRUITS AND VEGETABLES       DIET - INCREASE WATER INTAKE       Prevent falls         Depression Screen PHQ 2/9 Scores 02/14/2021 09/07/2020 06/30/2020  PHQ - 2 Score 0 0 0    Fall Risk Fall Risk  03/26/2021 02/14/2021 09/07/2020 06/30/2020 05/19/2020  Falls in the past year? 0 0 0 1 1  Number falls in past yr: 0 0 0 0 0  Injury with Fall? 0 0 0 1 0  Risk for fall due to : - No Fall Risks No Fall Risks Impaired balance/gait;Other (Comment) Impaired balance/gait  Risk for fall due to: Comment - - - fell on ice -  Follow up - Falls evaluation completed Falls evaluation completed Falls evaluation completed -    FALL RISK PREVENTION PERTAINING TO THE HOME:  Any stairs in or around the home? No  If so, are there any without handrails? No  Home free of loose throw rugs in walkways, pet beds, electrical cords, etc? yes Adequate lighting in your home to reduce risk of falls? Yes   ASSISTIVE DEVICES UTILIZED TO PREVENT FALLS:  Life alert? No  Use of a cane, walker or w/c? No  Grab bars in the  bathroom? No  Shower chair or bench in shower? No  Elevated toilet seat or a handicapped toilet? No   TIMED UP AND GO:  Was the test performed? No .  Length of time to ambulate 10 feet: 0 sec.     Cognitive Function:     6CIT Screen 03/26/2021  What Year? 0 points  What month? 0 points  What time? 0 points  Count back from 20 0 points  Months in reverse 0 points  Repeat phrase 0 points  Total Score 0    Immunizations Immunization History  Administered Date(s) Administered   Hepatitis B, adult 04/20/2018, 05/25/2018, 05/19/2019   Influenza, High Dose Seasonal PF 02/15/2015, 06/21/2016, 02/21/2017, 03/16/2018, 03/23/2019   Influenza, Seasonal, Injecte, Preservative Fre 02/28/2009, 03/29/2011, 07/01/2012, 05/03/2013   PFIZER(Purple Top)SARS-COV-2 Vaccination 06/15/2019, 07/07/2019   Pneumococcal Conjugate-13 06/21/2016   Pneumococcal Polysaccharide-23 09/09/2007, 07/01/2012, 03/16/2018   Tdap 03/29/2011    TDAP status: Up to date  Flu Vaccine status: Up to date  Pneumococcal vaccine status: Up to date  Covid-19 vaccine status: Completed vaccines  Qualifies for Shingles Vaccine? Yes   Zostavax completed No   Shingrix Completed?: No.    Education has been provided regarding the importance of this vaccine. Patient has been advised to call insurance company to determine out of pocket expense if they have not yet received this vaccine. Advised may also receive vaccine at local pharmacy or Health Dept. Verbalized acceptance and understanding.  Screening Tests Health Maintenance  Topic Date Due   Zoster Vaccines- Shingrix (1 of 2) Never done   TETANUS/TDAP  03/28/2021   COVID-19 Vaccine (4 - Booster for Pfizer series) 04/30/2021   COLONOSCOPY (Pts 45-58yrs Insurance coverage will need to be confirmed)  03/31/2023   Pneumonia Vaccine 77+ Years old  Completed   INFLUENZA VACCINE  Completed   Hepatitis C Screening  Completed   HPV VACCINES  Aged Out    Health  Maintenance  Health Maintenance Due  Topic Date Due   Zoster Vaccines- Shingrix (1 of 2) Never done   Colonoscopy: completed  Lung Cancer Screening: (Low Dose CT Chest recommended if Age 80-80 years, 30 pack-year currently smoking OR have quit w/in 15years.) does not qualify.   Lung Cancer Screening Referral: does not qualify   Additional Screening:  Hepatitis C Screening: does not qualify; Completed   Vision Screening: Recommended annual ophthalmology exams for early detection of glaucoma and other disorders of the eye. Is the patient up to date with their annual eye exam?  Yes  Who is the provider or what is the [facility] of the [facility] in which the [facility]  If pt is not established with a provider, would they like to be referred to a provider to establish care? No .   Dental Screening: Recommended annual dental exams for proper oral hygiene  Community Resource Referral / Chronic Care Management: CRR required this visit?  No   CCM required this visit?  Yes - patient declined services     Plan:     I have personally reviewed and noted the following in the patient's chart:   Medical and social history Use of alcohol, tobacco or illicit drugs  Current medications and supplements including opioid prescriptions. Patient is not currently taking opioid prescriptions. Functional ability and status Nutritional status Physical activity Advanced directives List of other physicians Hospitalizations, surgeries, and ER visits in previous 12 months Vitals Screenings to include cognitive, depression, and falls Referrals and appointments  In addition, I have reviewed and discussed with patient certain preventive protocols, quality metrics, and best practice recommendations. A written personalized care plan for preventive services as well as general preventive health recommendations were provided to patient.     Kate Sable, LPN, LPN   30/13/1438   Nurse  Notes: Visit performed by telephone. Patient at home and supervising provider Posey Pronto) in the office. Patient consents to telephone visit. Time spent with pt 25 mins.

## 2021-03-26 NOTE — Patient Instructions (Signed)
[name] , Thank you for taking time to come for your Medicare Wellness Visit. I appreciate your ongoing commitment to your health goals. Please review the following plan we discussed and let me know if I can assist you in the future.   Screening recommendations/referrals: Colonoscopy: up to date until 2024 Recommended yearly ophthalmology/optometry visit for glaucoma screening and checkup Recommended yearly dental visit for hygiene and checkup  Vaccinations: Influenza vaccine: up to date  Pneumococcal vaccine: up to date  Tdap vaccine: up to date  Shingles vaccine: can get at local pharmacy    Advanced directives: none on file   Conditions/risks identified: financial, transportation. Declined care management referral at this time.  Next appointment: Wellness in 1 year   Preventive Care 9 Years and Older, Male Preventive care refers to lifestyle choices and visits with your health care provider that can promote health and wellness. What does preventive care include? A yearly physical exam. This is also called an annual well check. Dental exams once or twice a year. Routine eye exams. Ask your health care provider how often you should have your eyes checked. Personal lifestyle choices, including: Daily care of your teeth and gums. Regular physical activity. Eating a healthy diet. Avoiding tobacco and drug use. Limiting alcohol use. Practicing safe sex. Taking low doses of aspirin every day. Taking vitamin and mineral supplements as recommended by your health care provider. What happens during an annual well check? The services and screenings done by your health care provider during your annual well check will depend on your age, overall health, lifestyle risk factors, and family history of disease. Counseling  Your health care provider may ask you questions about your: Alcohol use. Tobacco use. Drug use. Emotional well-being. Home and relationship well-being. Sexual  activity. Eating habits. History of falls. Memory and ability to understand (cognition). Work and work Statistician. Screening  You may have the following tests or measurements: Height, weight, and BMI. Blood pressure. Lipid and cholesterol levels. These may be checked every 5 years, or more frequently if you are over 36 years old. Skin check. Lung cancer screening. You may have this screening every year starting at age 11 if you have a 30-pack-year history of smoking and currently smoke or have quit within the past 15 years. Fecal occult blood test (FOBT) of the stool. You may have this test every year starting at age 71. Flexible sigmoidoscopy or colonoscopy. You may have a sigmoidoscopy every 5 years or a colonoscopy every 10 years starting at age 71. Prostate cancer screening. Recommendations will vary depending on your family history and other risks. Hepatitis C blood test. Hepatitis B blood test. Sexually transmitted disease (STD) testing. Diabetes screening. This is done by checking your blood sugar (glucose) after you have not eaten for a while (fasting). You may have this done every 1-3 years. Abdominal aortic aneurysm (AAA) screening. You may need this if you are a current or former smoker. Osteoporosis. You may be screened starting at age 71 if you are at high risk. Talk with your health care provider about your test results, treatment options, and if necessary, the need for more tests. Vaccines  Your health care provider may recommend certain vaccines, such as: Influenza vaccine. This is recommended every year. Tetanus, diphtheria, and acellular pertussis (Tdap, Td) vaccine. You may need a Td booster every 10 years. Zoster vaccine. You may need this after age 40. Pneumococcal 13-valent conjugate (PCV13) vaccine. One dose is recommended after age 56. Pneumococcal polysaccharide (PPSV23)  vaccine. One dose is recommended after age 11. Talk to your health care provider about which  screenings and vaccines you need and how often you need them. This information is not intended to replace advice given to you by your health care provider. Make sure you discuss any questions you have with your health care provider. Document Released: 05/26/2015 Document Revised: 01/17/2016 Document Reviewed: 02/28/2015 Elsevier Interactive Patient Education  2017 [place] Prevention in the Home Falls can cause injuries. They can happen to people of all ages. There are many things you can do to make your home safe and to help prevent falls. What can I do on the outside of my home? Regularly fix the edges of walkways and driveways and fix any cracks. Remove anything that might make you trip as you walk through a door, such as a raised step or threshold. Trim any bushes or trees on the path to your home. Use bright outdoor lighting. Clear any walking paths of anything that might make someone trip, such as rocks or tools. Regularly check to see if handrails are loose or broken. Make sure that both sides of any steps have handrails. Any raised decks and porches should have guardrails on the edges. Have any leaves, snow, or ice cleared regularly. Use sand or salt on walking paths during winter. Clean up any spills in your garage right away. This includes oil or grease spills. What can I do in the bathroom? Use night lights. Install grab bars by the toilet and in the tub and shower. Do not use towel bars as grab bars. Use non-skid mats or decals in the tub or shower. If you need to sit down in the shower, use a plastic, non-slip stool. Keep the floor dry. Clean up any water that spills on the floor as soon as it happens. Remove soap buildup in the tub or shower regularly. Attach bath mats securely with double-sided non-slip rug tape. Do not have throw rugs and other things on the floor that can make you trip. What can I do in the bedroom? Use night lights. Make sure that you have a  light by your bed that is easy to reach. Do not use any sheets or blankets that are too big for your bed. They should not hang down onto the floor. Have a firm chair that has side arms. You can use this for support while you get dressed. Do not have throw rugs and other things on the floor that can make you trip. What can I do in the kitchen? Clean up any spills right away. Avoid walking on wet floors. Keep items that you use a lot in easy-to-reach places. If you need to reach something above you, use a strong step stool that has a grab bar. Keep electrical cords out of the way. Do not use floor polish or wax that makes floors slippery. If you must use wax, use non-skid floor wax. Do not have throw rugs and other things on the floor that can make you trip. What can I do with my stairs? Do not leave any items on the stairs. Make sure that there are handrails on both sides of the stairs and use them. Fix handrails that are broken or loose. Make sure that handrails are as long as the stairways. Check any carpeting to make sure that it is firmly attached to the stairs. Fix any carpet that is loose or worn. Avoid having throw rugs at the top or bottom  of the stairs. If you do have throw rugs, attach them to the floor with carpet tape. Make sure that you have a light switch at the top of the stairs and the bottom of the stairs. If you do not have them, ask someone to add them for you. What else can I do to help prevent falls? Wear shoes that: Do not have high heels. Have rubber bottoms. Are comfortable and fit you well. Are closed at the toe. Do not wear sandals. If you use a stepladder: Make sure that it is fully opened. Do not climb a closed stepladder. Make sure that both sides of the stepladder are locked into place. Ask someone to hold it for you, if possible. Clearly mark and make sure that you can see: Any grab bars or handrails. First and last steps. Where the edge of each step  is. Use tools that help you move around (mobility aids) if they are needed. These include: Canes. Walkers. Scooters. Crutches. Turn on the lights when you go into a dark area. Replace any light bulbs as soon as they burn out. Set up your furniture so you have a clear path. Avoid moving your furniture around. If any of your floors are uneven, fix them. If there are any pets around you, be aware of where they are. Review your medicines with your doctor. Some medicines can make you feel dizzy. This can increase your chance of falling. Ask your doctor what other things that you can do to help prevent falls. This information is not intended to replace advice given to you by your health care provider. Make sure you discuss any questions you have with your health care provider. Document Released: 02/23/2009 Document Revised: 10/05/2015 Document Reviewed: 06/03/2014 Elsevier Interactive Patient Education  2017 [employer].

## 2021-03-27 ENCOUNTER — Ambulatory Visit (INDEPENDENT_AMBULATORY_CARE_PROVIDER_SITE_OTHER): Payer: No Typology Code available for payment source | Admitting: Internal Medicine

## 2021-03-29 ENCOUNTER — Other Ambulatory Visit (INDEPENDENT_AMBULATORY_CARE_PROVIDER_SITE_OTHER): Payer: Self-pay

## 2021-03-29 DIAGNOSIS — D649 Anemia, unspecified: Secondary | ICD-10-CM

## 2021-04-03 NOTE — Patient Instructions (Signed)
   Your procedure is scheduled on: 04/11/2021  Report to [facility] at    6:15 AM.  Call this number if you have problems the morning of surgery: [phone]   Remember:              Follow Directions on the letter you received from Your Physician's office regarding the Bowel Prep              No Smoking the day of Procedure :   Take these medicines the morning of surgery with A SIP OF WATER: none   Do not wear jewelry, make-up or nail polish.    Do not bring valuables to the hospital.  Contacts, dentures or bridgework may not be worn into surgery.  .   Patients discharged the day of surgery will not be allowed to drive home.     Colonoscopy, Adult, Care After This sheet gives you information about how to care for yourself after your procedure. Your health care provider may also give you more specific instructions. If you have problems or questions, contact your health care provider. What can I expect after the procedure? After the procedure, it is common to have: A small amount of blood in your stool for 24 hours after the procedure. Some gas. Mild abdominal cramping or bloating.  Follow these instructions at home: General instructions  For the first 24 hours after the procedure: Do not drive or use machinery. Do not sign important documents. Do not drink alcohol. Do your regular daily activities at a slower pace than normal. Eat soft, easy-to-digest foods. Rest often. Take over-the-counter or prescription medicines only as told by your health care provider. It is up to you to get the results of your procedure. Ask your health care provider, or the department performing the procedure, when your results will be ready. Relieving cramping and bloating Try walking around when you have cramps or feel bloated. Apply heat to your abdomen as told by your health care provider. Use a heat source that your health care provider recommends, such as a moist heat pack or a  heating pad. Place a towel between your skin and the heat source. Leave the heat on for 20-30 minutes. Remove the heat if your skin turns bright red. This is especially important if you are unable to feel pain, heat, or cold. You may have a greater risk of getting burned. Eating and drinking Drink enough fluid to keep your urine clear or pale yellow. Resume your normal diet as instructed by your health care provider. Avoid heavy or fried foods that are hard to digest. Avoid drinking alcohol for as long as instructed by your health care provider. Contact a health care provider if: You have blood in your stool 2-3 days after the procedure. Get help right away if: You have more than a small spotting of blood in your stool. You pass large blood clots in your stool. Your abdomen is swollen. You have nausea or vomiting. You have a fever. You have increasing abdominal pain that is not relieved with medicine. This information is not intended to replace advice given to you by your health care provider. Make sure you discuss any questions you have with your health care provider. Document Released: 12/12/2003 Document Revised: 01/22/2016 Document Reviewed: 07/11/2015 Elsevier Interactive Patient Education  [employer].

## 2021-04-09 ENCOUNTER — Encounter (INDEPENDENT_AMBULATORY_CARE_PROVIDER_SITE_OTHER): Payer: Self-pay

## 2021-04-09 ENCOUNTER — Telehealth (INDEPENDENT_AMBULATORY_CARE_PROVIDER_SITE_OTHER): Payer: Self-pay

## 2021-04-09 ENCOUNTER — Encounter (HOSPITAL_COMMUNITY)
Admission: RE | Admit: 2021-04-09 | Discharge: 2021-04-09 | Disposition: A | Discharge status: Home / Self Care | Payer: No Typology Code available for payment source | Source: Ambulatory Visit | Attending: Internal Medicine | Admitting: Internal Medicine

## 2021-04-09 DIAGNOSIS — K625 Hemorrhage of anus and rectum: Secondary | ICD-10-CM

## 2021-04-09 DIAGNOSIS — D500 Iron deficiency anemia secondary to blood loss (chronic): Secondary | ICD-10-CM

## 2021-04-09 DIAGNOSIS — D649 Anemia, unspecified: Secondary | ICD-10-CM

## 2021-04-09 MED ORDER — PEG 3350-KCL-NA BICARB-NACL 420 G PO SOLR: 4000.0000 mL | ORAL | 0 refills | Status: DC

## 2021-04-09 NOTE — Telephone Encounter (Signed)
Jonathan Benitez, CMA  

## 2021-04-23 ENCOUNTER — Telehealth: Payer: Self-pay

## 2021-04-23 ENCOUNTER — Other Ambulatory Visit: Payer: Self-pay | Admitting: Internal Medicine

## 2021-04-23 DIAGNOSIS — M542 Cervicalgia: Secondary | ICD-10-CM

## 2021-04-23 DIAGNOSIS — M1710 Unilateral primary osteoarthritis, unspecified knee: Secondary | ICD-10-CM

## 2021-04-23 MED ORDER — TRAMADOL HCL 50 MG PO TABS: 50.0000 mg | ORAL_TABLET | Freq: Three times a day (TID) | ORAL | 0 refills | Status: AC | PRN

## 2021-04-23 NOTE — Telephone Encounter (Signed)
Patient called and needs tramadol 10 mg  Pharmacy: [facility]

## 2021-04-24 NOTE — Telephone Encounter (Signed)
Pt states that the [place] has changed and will no longer prescribe controlled substances, He states he will talk to you about this at his appt in February

## 2021-05-01 ENCOUNTER — Other Ambulatory Visit: Payer: Self-pay

## 2021-05-01 ENCOUNTER — Encounter: Payer: Self-pay | Admitting: Internal Medicine

## 2021-05-01 ENCOUNTER — Ambulatory Visit (INDEPENDENT_AMBULATORY_CARE_PROVIDER_SITE_OTHER): Payer: No Typology Code available for payment source | Admitting: Internal Medicine

## 2021-05-01 DIAGNOSIS — M1990 Unspecified osteoarthritis, unspecified site: Secondary | ICD-10-CM | POA: Insufficient documentation

## 2021-05-01 DIAGNOSIS — M10072 Idiopathic gout, left ankle and foot: Secondary | ICD-10-CM

## 2021-05-01 MED ORDER — COLCHICINE 0.6 MG PO TABS
ORAL_TABLET | ORAL | 0 refills | Status: DC
Start: 2021-05-01 — End: 2021-06-19

## 2021-05-01 NOTE — Assessment & Plan Note (Signed)
Started Colchicine Continue allopurinol Avoid alcohol Avoid red meat products Dietary recommendations provided

## 2021-05-01 NOTE — Progress Notes (Signed)
Virtual Visit via Telephone Note   This visit type was conducted due to national recommendations for restrictions regarding the COVID-19 Pandemic (e.g. social distancing) in an effort to limit this patient's exposure and mitigate transmission in our community.  Due to his co-morbid illnesses, this patient is at least at moderate risk for complications without adequate follow up.  This format is felt to be most appropriate for this patient at this time.  The patient did not have access to video technology/had technical difficulties with video requiring transitioning to audio format only (telephone).  All issues noted in this document were discussed and addressed.  No physical exam could be performed with this format.  Evaluation Performed:  Follow-up visit  Date:  05/01/2021   ID:  Jonathan Benitez, Jonathan Benitez 11-06-1950, MRN [id number]  Patient Location: Home Provider Location: Office/Clinic  Participants: Patient Location of Patient: Home Location of Provider: Telehealth Consent was obtain for visit to be over via telehealth. I verified that I am speaking with the correct person using two identifiers.  PCP:  Lindell Spar, MD   Chief Complaint: Gout  History of Present Illness:    Jonathan Benitez is a 70 y.o. male who has a televisit for complaint of left ankle swelling for last 4 days, with burning pain around the area.  He has a history of gout, for which he takes allopurinol.  He admits that he had a hamburger before the swelling and pain started.  Denies major alcohol intake recently.  The patient does not have symptoms concerning for COVID-19 infection (fever, chills, cough, or new shortness of breath).   Past Medical, Surgical, Social History, Allergies, and Medications have been Reviewed.  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 Florian Buff     Current Meds  Medication Sig   allopurinol (ZYLOPRIM) 100 MG tablet Take 100 mg by mouth daily as needed (gout).   cetirizine (ZYRTEC) 10 MG tablet Take 10 mg by mouth 2 (two) times daily.   cyclobenzaprine (FLEXERIL) 5 MG tablet Take 1 tablet (5 mg total) by mouth 3 (three) times daily as needed for muscle spasms.   diclofenac Sodium (VOLTAREN) 1 % GEL Apply 2 g topically 4 (four) times daily. (Patient taking differently: Apply 2 g topically daily as needed (pain).)   fluticasone (FLONASE) 50 MCG/ACT nasal spray Place 2 sprays into both nostrils daily as needed for allergies.    lisinopril (ZESTRIL) 20 MG tablet Take 20 mg by mouth daily.   mirtazapine (REMERON) 15 MG tablet Take 1 tablet (15 mg total) by mouth at bedtime.   omeprazole (PRILOSEC) 40 MG capsule Take 1 capsule (40 mg total) by mouth daily.   polyethylene glycol-electrolytes (TRILYTE) 420 g solution Take 4,000 mLs by mouth as  directed.   potassium chloride SA (KLOR-CON) 20 MEQ tablet Take 2 tablets (40 mEq total) by mouth daily. (Patient taking differently: Take 20 mEq by mouth 2 (two) times daily.)   traMADol (ULTRAM) 50 MG tablet Take 1 tablet (50 mg total) by mouth every 8 (eight) hours as needed for up to 10 days.     Allergies:   Nsaids   ROS:   Please see the history of present illness.     All other systems reviewed and are negative.   Labs/Other Tests and Data Reviewed:    Recent Labs: 09/07/2020: TSH 2.640 01/22/2021: ALT  27; BUN 7; Creatinine, Ser 0.81; Hemoglobin 10.4; Platelets 122; Potassium 4.1; Sodium 141   Recent Lipid Panel Lab Results  Component Value Date/Time   CHOL 129 09/07/2020 09:36 AM   TRIG 69 09/07/2020 09:36 AM   HDL 73 09/07/2020 09:36 AM   CHOLHDL 1.8 09/07/2020 09:36 AM   LDLCALC 42 09/07/2020 09:36 AM    Wt Readings from Last 3 Encounters:  02/14/21 158 lb 0.6 oz (71.7 kg)  01/22/21 142 lb 1.6 oz (64.5 kg)  01/09/21 143 lb (64.9 kg)     ASSESSMENT & PLAN:    Acute idiopathic gout of left ankle Started Colchicine Continue allopurinol Avoid alcohol Avoid red meat products Dietary recommendations provided    Time:   Today, I have spent 12 minutes reviewing the chart, including problem list, medications, and with the patient with telehealth technology discussing the above problems.   Medication Adjustments/Labs and Tests Ordered: Current medicines are reviewed at length with the patient today.  Concerns regarding medicines are outlined above.   Tests Ordered: No orders of the defined types were placed in this encounter.   Medication Changes: No orders of the defined types were placed in this encounter.    Note: This dictation was prepared with Dragon dictation along with smaller phrase technology. Similar sounding words can be transcribed inadequately or may not be corrected upon review. Any transcriptional errors that result from this process are unintentional.      Disposition:  Follow up  Signed, Lindell Spar, MD  05/01/2021 12:00 PM     [place]

## 2021-05-17 NOTE — Patient Instructions (Signed)
Jonathan Benitez  05/17/2021     @PREFPERIOPPHARMACY @   Your procedure is scheduled on  05/23/2021.   Report to [facility] at  [phone] A.M.   Call this number if you have problems the morning of surgery:  [phone]   Remember:  Follow the diet and prep instructions given to you by the office.    Take these medicines the morning of surgery with A SIP OF WATER                 allopurinol, flexeril(If needed), prilosec.     Do not wear jewelry, make-up or nail polish.  Do not wear lotions, powders, or perfumes, or deodorant.  Do not shave 48 hours prior to surgery.  Men may shave face and neck.  Do not bring valuables to the hospital.  [facility] is not responsible for any belongings or valuables.  Contacts, dentures or bridgework may not be worn into surgery.  Leave your suitcase in the car.  After surgery it may be brought to your room.  For patients admitted to the hospital, discharge time will be determined by your treatment team.  Patients discharged the day of surgery will not be allowed to drive home and must have someone with them for 24 hours.    Special instructions:   DO NOT smoke tobacco or vape for 24 hours before your procedure.  Please read over the following fact sheets that you were given. Anesthesia Post-op Instructions and Care and Recovery After Surgery      Colonoscopy, Adult, Care After This sheet gives you information about how to care for yourself after your procedure. Your health care provider may also give you more specific instructions. If you have problems or questions, contact your health care provider. What can I expect after the procedure? After the procedure, it is common to have: A small amount of blood in your stool for 24 hours after the procedure. Some gas. Mild cramping or bloating of your abdomen. Follow these instructions at home: Eating and drinking  Drink enough fluid to keep your urine pale yellow. Follow  instructions from your health care provider about eating or drinking restrictions. Resume your normal diet as instructed by your health care provider. Avoid heavy or fried foods that are hard to digest. Activity Rest as told by your health care provider. Avoid sitting for a long time without moving. Get up to take short walks every 1-2 hours. This is important to improve blood flow and breathing. Ask for help if you feel weak or unsteady. Return to your normal activities as told by your health care provider. Ask your health care provider what activities are safe for you. Managing cramping and bloating  Try walking around when you have cramps or feel bloated. Apply heat to your abdomen as told by your health care provider. Use the heat source that your health care provider recommends, such as a moist heat pack or a heating pad. Place a towel between your skin and the heat source. Leave the heat on for 20-30 minutes. Remove the heat if your skin turns bright red. This is especially important if you are unable to feel pain, heat, or cold. You may have a greater risk of getting burned. General instructions If you were given a sedative during the procedure, it can affect you for several hours. Do not drive or operate machinery until your health care provider says that it is safe. For the first  24 hours after the procedure: Do not sign important documents. Do not drink alcohol. Do your regular daily activities at a slower pace than normal. Eat soft foods that are easy to digest. Take over-the-counter and prescription medicines only as told by your health care provider. Keep all follow-up visits as told by your health care provider. This is important. Contact a health care provider if: You have blood in your stool 2-3 days after the procedure. Get help right away if you have: More than a small spotting of blood in your stool. Large blood clots in your stool. Swelling of your abdomen. Nausea or  vomiting. A fever. Increasing pain in your abdomen that is not relieved with medicine. Summary After the procedure, it is common to have a small amount of blood in your stool. You may also have mild cramping and bloating of your abdomen. If you were given a sedative during the procedure, it can affect you for several hours. Do not drive or operate machinery until your health care provider says that it is safe. Get help right away if you have a lot of blood in your stool, nausea or vomiting, a fever, or increased pain in your abdomen. This information is not intended to replace advice given to you by your health care provider. Make sure you discuss any questions you have with your health care provider. Document Revised: 03/05/2019 Document Reviewed: 11/23/2018 Elsevier Patient Education  [place] After This sheet gives you information about how to care for yourself after your procedure. Your health care provider may also give you more specific instructions. If you have problems or questions, contact your health care provider. What can I expect after the procedure? After the procedure, it is common to have: Tiredness. Forgetfulness about what happened after the procedure. Impaired judgment for important decisions. Nausea or vomiting. Some difficulty with balance. Follow these instructions at home: For the time period you were told by your health care provider:   Rest as needed. Do not participate in activities where you could fall or become injured. Do not drive or use machinery. Do not drink alcohol. Do not take sleeping pills or medicines that cause drowsiness. Do not make important decisions or sign legal documents. Do not take care of children on your own. Eating and drinking Follow the diet that is recommended by your health care provider. Drink enough fluid to keep your urine pale yellow. If you vomit: Drink water, juice, or soup when  you can drink without vomiting. Make sure you have little or no nausea before eating solid foods. General instructions Have a responsible adult stay with you for the time you are told. It is important to have someone help care for you until you are awake and alert. Take over-the-counter and prescription medicines only as told by your health care provider. If you have sleep apnea, surgery and certain medicines can increase your risk for breathing problems. Follow instructions from your health care provider about wearing your sleep device: Anytime you are sleeping, including during daytime naps. While taking prescription pain medicines, sleeping medicines, or medicines that make you drowsy. Avoid smoking. Keep all follow-up visits as told by your health care provider. This is important. Contact a health care provider if: You keep feeling nauseous or you keep vomiting. You feel light-headed. You are still sleepy or having trouble with balance after 24 hours. You develop a rash. You have a fever. You have redness or swelling around the IV  site. Get help right away if: You have trouble breathing. You have new-onset confusion at home. Summary For several hours after your procedure, you may feel tired. You may also be forgetful and have poor judgment. Have a responsible adult stay with you for the time you are told. It is important to have someone help care for you until you are awake and alert. Rest as told. Do not drive or operate machinery. Do not drink alcohol or take sleeping pills. Get help right away if you have trouble breathing, or if you suddenly become confused. This information is not intended to replace advice given to you by your health care provider. Make sure you discuss any questions you have with your health care provider. Document Revised: 01/13/2020 Document Reviewed: 04/01/2019 Elsevier Patient Education  2022 [employer].

## 2021-05-21 ENCOUNTER — Encounter (HOSPITAL_COMMUNITY)
Admission: RE | Admit: 2021-05-21 | Discharge: 2021-05-21 | Disposition: A | Discharge status: Home / Self Care | Payer: No Typology Code available for payment source | Source: Ambulatory Visit | Attending: Internal Medicine | Admitting: Internal Medicine

## 2021-05-21 ENCOUNTER — Encounter (HOSPITAL_COMMUNITY): Payer: Self-pay

## 2021-05-23 ENCOUNTER — Ambulatory Visit (HOSPITAL_COMMUNITY)
Admission: RE | Admit: 2021-05-23 | Payer: No Typology Code available for payment source | Source: Home / Self Care | Admitting: Internal Medicine

## 2021-05-23 ENCOUNTER — Encounter (HOSPITAL_COMMUNITY): Admission: RE | Payer: Self-pay | Source: Home / Self Care

## 2021-05-23 SURGERY — COLONOSCOPY WITH PROPOFOL
Anesthesia: Monitor Anesthesia Care

## 2021-05-30 ENCOUNTER — Emergency Department (HOSPITAL_COMMUNITY)
Admission: EM | Admit: 2021-05-30 | Discharge: 2021-05-30 | Disposition: A | Discharge status: Home / Self Care | Payer: No Typology Code available for payment source | Attending: Emergency Medicine | Admitting: Emergency Medicine

## 2021-05-30 ENCOUNTER — Encounter (HOSPITAL_COMMUNITY): Payer: Self-pay

## 2021-05-30 ENCOUNTER — Other Ambulatory Visit: Payer: Self-pay

## 2021-05-30 DIAGNOSIS — M79671 Pain in right foot: Secondary | ICD-10-CM | POA: Diagnosis present

## 2021-05-30 DIAGNOSIS — M10071 Idiopathic gout, right ankle and foot: Secondary | ICD-10-CM | POA: Insufficient documentation

## 2021-05-30 DIAGNOSIS — M10072 Idiopathic gout, left ankle and foot: Secondary | ICD-10-CM | POA: Diagnosis not present

## 2021-05-30 DIAGNOSIS — M1000 Idiopathic gout, unspecified site: Secondary | ICD-10-CM

## 2021-05-30 MED ORDER — PREDNISONE 10 MG PO TABS: 20.0000 mg | ORAL_TABLET | Freq: Every day | ORAL | 0 refills | Status: DC

## 2021-05-30 MED ORDER — METHYLPREDNISOLONE SODIUM SUCC 125 MG IJ SOLR
125.0000 mg | Freq: Once | INTRAMUSCULAR | Status: AC
  Administered 2021-05-30: 125 mg via INTRAMUSCULAR
  Filled 2021-05-30: qty 2

## 2021-05-30 MED ORDER — HYDROCODONE-ACETAMINOPHEN 5-325 MG PO TABS: 1.0000 | ORAL_TABLET | Freq: Four times a day (QID) | ORAL | 0 refills | Status: DC | PRN

## 2021-05-30 NOTE — Discharge Instructions (Signed)
Follow-up with your doctor next week if not improving °

## 2021-05-30 NOTE — ED Triage Notes (Signed)
Pt said that his foot hurts from the big toe all across the top of his foot, said it feels like gout. Has been hurting for the last 3 days

## 2021-05-30 NOTE — ED Provider Notes (Signed)
[name] Provider Note   CSN: [id number] Arrival date & time: 05/30/21  3329     History  Chief Complaint  Patient presents with   Foot Pain    Jonathan Benitez is a 71 y.o. male.  Patient with a history of gout.  Patient states that both of his feet hurt on the top and in the toes.  He states this is usually his gout.  The history is provided by the patient and medical records. No language interpreter was used.  Foot Pain This is a recurrent problem. The current episode started 12 to 24 hours ago. The problem occurs constantly. The problem has not changed since onset.Pertinent negatives include no chest pain, no abdominal pain and no headaches. Nothing aggravates the symptoms. Nothing relieves the symptoms. He has tried nothing for the symptoms. The treatment provided no relief.      Home Medications Prior to Admission medications   Medication Sig Start Date End Date Taking? Authorizing Provider  HYDROcodone-acetaminophen (NORCO/VICODIN) 5-325 MG tablet Take 1 tablet by mouth every 6 (six) hours as needed. 05/30/21  Yes Milton Ferguson, MD  predniSONE (DELTASONE) 10 MG tablet Take 2 tablets (20 mg total) by mouth daily. 05/30/21  Yes Milton Ferguson, MD  allopurinol (ZYLOPRIM) 100 MG tablet Take 100 mg by mouth daily.    [provider]  cetirizine (ZYRTEC) 10 MG tablet Take 10 mg by mouth 2 (two) times daily.    [provider]  colchicine 0.6 MG tablet Take 2 tablets today, followed by 1 tablet 1 hour later. Continue taking 1 tablet once daily from second day until gout flare resolves. 05/01/21   Lindell Spar, MD  cyclobenzaprine (FLEXERIL) 5 MG tablet Take 1 tablet (5 mg total) by mouth 3 (three) times daily as needed for muscle spasms. 03/01/21   Lindell Spar, MD  diclofenac Sodium (VOLTAREN) 1 % GEL Apply 2 g topically 4 (four) times daily. Patient taking differently: Apply 2 g topically daily as needed (pain). 05/01/20   Corena Herter, PA-C  fluticasone (FLONASE) 50 MCG/ACT nasal spray Place 2 sprays into both nostrils daily as needed for allergies.     [provider]  lisinopril (ZESTRIL) 20 MG tablet Take 20 mg by mouth daily.    [provider]  mirtazapine (REMERON) 15 MG tablet Take 1 tablet (15 mg total) by mouth at bedtime. 09/07/20   Lindell Spar, MD  omeprazole (PRILOSEC) 40 MG capsule Take 1 capsule (40 mg total) by mouth daily. 06/30/20   Noreene Larsson, NP  polyethylene glycol-electrolytes (TRILYTE) 420 g solution Take 4,000 mLs by mouth as directed. 04/09/21   Rehman, Mechele Dawley, MD  potassium chloride SA (KLOR-CON) 20 MEQ tablet Take 2 tablets (40 mEq total) by mouth daily. Patient taking differently: Take 20 mEq by mouth 2 (two) times daily. 04/27/20   Elnora Morrison, MD      Allergies    Nsaids    Review of Systems   Review of Systems  Constitutional:  Negative for appetite change and fatigue.  HENT:  Negative for congestion, ear discharge and sinus pressure.   Eyes:  Negative for discharge.  Respiratory:  Negative for cough.   Cardiovascular:  Negative for chest pain.  Gastrointestinal:  Negative for abdominal pain and diarrhea.  Genitourinary:  Negative for frequency and hematuria.  Musculoskeletal:  Negative for back pain.       Pain in both feet  Skin:  Negative for  rash.  Neurological:  Negative for seizures and headaches.  Psychiatric/Behavioral:  Negative for hallucinations.    Physical Exam Updated Vital Signs BP (!) 161/91 (BP Location: Right Arm)    Pulse 98    Temp 98.2 F (36.8 C) (Oral)    Resp 18    Ht 6' (1.829 m)    Wt 83.9 kg    SpO2 100%    BMI 25.09 kg/m  Physical Exam Vitals and nursing note reviewed.  Constitutional:      Appearance: He is well-developed.  HENT:     Head: Normocephalic.     Nose: Nose normal.  Eyes:     General: No scleral icterus.    Conjunctiva/sclera: Conjunctivae normal.  Neck:     Thyroid: No thyromegaly.   Cardiovascular:     Rate and Rhythm: Normal rate and regular rhythm.     Heart sounds: No murmur heard.   No friction rub. No gallop.  Pulmonary:     Breath sounds: No stridor. No wheezing or rales.  Chest:     Chest wall: No tenderness.  Abdominal:     General: There is no distension.     Tenderness: There is no abdominal tenderness. There is no rebound.  Musculoskeletal:        General: Normal range of motion.     Cervical back: Neck supple.     Comments: Tenderness to the top of both feet and toes  Lymphadenopathy:     Cervical: No cervical adenopathy.  Skin:    Findings: No erythema or rash.  Neurological:     Mental Status: He is alert and oriented to person, place, and time.     Motor: No abnormal muscle tone.     Coordination: Coordination normal.  Psychiatric:        Behavior: Behavior normal.    ED Results / Procedures / Treatments   Labs (all labs ordered are listed, but only abnormal results are displayed) Labs Reviewed - No data to display  EKG None  Radiology No results found.  Procedures Procedures    Medications Ordered in ED Medications  methylPREDNISolone sodium succinate (SOLU-MEDROL) 125 mg/2 mL injection 125 mg (has no administration in time range)    ED Course/ Medical Decision Making/ A&P                           Medical Decision Making Risk Prescription drug management.   Patient with gouty arthritis.  He is started on prednisone and given Vicodin and will follow up with his PCP    This patient presents to the ED for concern of pain in feet, this involves an extensive number of treatment options, and is a complaint that carries with it a high risk of complications and morbidity.  The differential diagnosis includes gout, cellulitis   Co morbidities that complicate the patient evaluation  Gout   Additional history obtained:  Additional history obtained from patient External records from outside source obtained and reviewed  including hospital records   Lab Tests:  No labs  Imaging Studies ordered: No x-rays  Cardiac Monitoring: No monitor  Medicines ordered and prescription drug management:  I ordered medication including steroids for gout Reevaluation of the patient after these medicines showed that the patient stayed the same I have reviewed the patients home medicines and have made adjustments as needed   Test Considered:  Uric acid   Critical Interventions:  None   Consultations Obtained:  No consult  Problem List / ED Course:  Gout   Reevaluation:  After the interventions noted above, I reevaluated the patient and found that they have :stayed the same   Social Determinants of Health:  None   Dispostion:  After consideration of the diagnostic results and the patients response to treatment, I feel that the patent would benefit from discharge home with pain medicine and prednisone follow-up with PCP.         Final Clinical Impression(s) / ED Diagnoses Final diagnoses:  Idiopathic gout, unspecified chronicity, unspecified site    Rx / DC Orders ED Discharge Orders          Ordered    predniSONE (DELTASONE) 10 MG tablet  Daily        05/30/21 0757    HYDROcodone-acetaminophen (NORCO/VICODIN) 5-325 MG tablet  Every 6 hours PRN        05/30/21 0757              Milton Ferguson, MD 05/31/21 1151

## 2021-06-07 ENCOUNTER — Encounter: Payer: Self-pay | Admitting: *Deleted

## 2021-06-07 DIAGNOSIS — M25579 Pain in unspecified ankle and joints of unspecified foot: Secondary | ICD-10-CM | POA: Insufficient documentation

## 2021-06-18 ENCOUNTER — Ambulatory Visit: Payer: No Typology Code available for payment source | Admitting: Internal Medicine

## 2021-06-19 ENCOUNTER — Other Ambulatory Visit: Payer: Self-pay | Admitting: Internal Medicine

## 2021-06-19 ENCOUNTER — Telehealth: Payer: Self-pay | Admitting: Internal Medicine

## 2021-06-19 ENCOUNTER — Other Ambulatory Visit: Payer: Self-pay | Admitting: *Deleted

## 2021-06-19 ENCOUNTER — Telehealth: Payer: Self-pay

## 2021-06-19 DIAGNOSIS — M10072 Idiopathic gout, left ankle and foot: Secondary | ICD-10-CM

## 2021-06-19 MED ORDER — ALLOPURINOL 100 MG PO TABS: 100.0000 mg | ORAL_TABLET | Freq: Every day | ORAL | 0 refills | Status: DC

## 2021-06-19 NOTE — Telephone Encounter (Signed)
Patient called need refill  colchicine 0.6 MG tablet , patient request a 30 day supply for his gout.  Pharmacy:[facility]

## 2021-06-19 NOTE — Telephone Encounter (Signed)
This was sent to pharmacy 06-19-21

## 2021-06-19 NOTE — Telephone Encounter (Signed)
Pt needs a 30 day supply of Gout pills

## 2021-06-19 NOTE — Telephone Encounter (Signed)
Medication sent to pharmacy  

## 2021-08-16 ENCOUNTER — Ambulatory Visit: Payer: No Typology Code available for payment source | Admitting: Internal Medicine

## 2021-08-20 ENCOUNTER — Ambulatory Visit: Payer: No Typology Code available for payment source | Admitting: Family Medicine

## 2021-08-22 ENCOUNTER — Telehealth (INDEPENDENT_AMBULATORY_CARE_PROVIDER_SITE_OTHER): Payer: Self-pay | Admitting: Internal Medicine

## 2021-08-22 ENCOUNTER — Other Ambulatory Visit (INDEPENDENT_AMBULATORY_CARE_PROVIDER_SITE_OTHER): Payer: Self-pay

## 2021-08-22 ENCOUNTER — Telehealth (INDEPENDENT_AMBULATORY_CARE_PROVIDER_SITE_OTHER): Payer: Self-pay

## 2021-08-22 DIAGNOSIS — K625 Hemorrhage of anus and rectum: Secondary | ICD-10-CM

## 2021-08-22 DIAGNOSIS — R1115 Cyclical vomiting syndrome unrelated to migraine: Secondary | ICD-10-CM

## 2021-08-22 DIAGNOSIS — K529 Noninfective gastroenteritis and colitis, unspecified: Secondary | ICD-10-CM

## 2021-08-22 MED ORDER — PEG 3350-KCL-NA BICARB-NACL 420 G PO SOLR: 4000.0000 mL | ORAL | 0 refills | Status: DC

## 2021-08-22 NOTE — Telephone Encounter (Signed)
Patient left voice mail stating he wants to reschedule his colonoscopy - please advise - ph# [phone] ?

## 2021-08-22 NOTE — Telephone Encounter (Signed)
Dailyn Kempner Ann Hend Mccarrell, CMA  ?

## 2021-08-23 ENCOUNTER — Encounter (INDEPENDENT_AMBULATORY_CARE_PROVIDER_SITE_OTHER): Payer: Self-pay

## 2021-09-14 NOTE — Patient Instructions (Signed)
?   Your procedure is scheduled on: 09/19/2021 ? Report to [facility] at   7:00  AM. ? Call this number if you have problems the morning of surgery: [phone] ? ? Remember: ? ?            Follow Directions on the letter you received from Your Physician's office regarding the Bowel Prep ? ?            No Smoking the day of Procedure : ? ? Take these medicines the morning of surgery with A SIP OF WATER: zyrtec   (Flonase and colchicine if needed) ? ? Do not wear jewelry, make-up or nail polish. ?  ? Do not bring valuables to the hospital. ? Contacts, dentures or bridgework may not be worn into surgery. ? . ? ? Patients discharged the day of surgery will not be allowed to drive home. ?  ?  ?Colonoscopy, Adult, Care After ?This sheet gives you information about how to care for yourself after your procedure. Your health care provider may also give you more specific instructions. If you have problems or questions, contact your health care provider. ?What can I expect after the procedure? ?After the procedure, it is common to have: ?A small amount of blood in your stool for 24 hours after the procedure. ?Some gas. ?Mild abdominal cramping or bloating. ? ?Follow these instructions at home: ?General instructions ? ?For the first 24 hours after the procedure: ?Do not drive or use machinery. ?Do not sign important documents. ?Do not drink alcohol. ?Do your regular daily activities at a slower pace than normal. ?Eat soft, easy-to-digest foods. ?Rest often. ?Take over-the-counter or prescription medicines only as told by your health care provider. ?It is up to you to get the results of your procedure. Ask your health care provider, or the department performing the procedure, when your results will be ready. ?Relieving cramping and bloating ?Try walking around when you have cramps or feel bloated. ?Apply heat to your abdomen as told by your health care provider. Use a heat source that your health care provider  recommends, such as a moist heat pack or a heating pad. ?Place a towel between your skin and the heat source. ?Leave the heat on for 20-30 minutes. ?Remove the heat if your skin turns bright red. This is especially important if you are unable to feel pain, heat, or cold. You may have a greater risk of getting burned. ?Eating and drinking ?Drink enough fluid to keep your urine clear or pale yellow. ?Resume your normal diet as instructed by your health care provider. Avoid heavy or fried foods that are hard to digest. ?Avoid drinking alcohol for as long as instructed by your health care provider. ?Contact a health care provider if: ?You have blood in your stool 2-3 days after the procedure. ?Get help right away if: ?You have more than a small spotting of blood in your stool. ?You pass large blood clots in your stool. ?Your abdomen is swollen. ?You have nausea or vomiting. ?You have a fever. ?You have increasing abdominal pain that is not relieved with medicine. ?This information is not intended to replace advice given to you by your health care provider. Make sure you discuss any questions you have with your health care provider. ?Document Released: 12/12/2003 Document Revised: 01/22/2016 Document Reviewed: 07/11/2015 ?Elsevier Interactive Patient Education ? 2018 [place].  ?

## 2021-09-17 ENCOUNTER — Encounter (HOSPITAL_COMMUNITY)
Admission: RE | Admit: 2021-09-17 | Discharge: 2021-09-17 | Disposition: A | Discharge status: Home / Self Care | Payer: No Typology Code available for payment source | Source: Ambulatory Visit | Attending: Internal Medicine | Admitting: Internal Medicine

## 2021-09-19 ENCOUNTER — Encounter (HOSPITAL_COMMUNITY): Admission: RE | Payer: Self-pay | Source: Home / Self Care

## 2021-09-19 ENCOUNTER — Ambulatory Visit (HOSPITAL_COMMUNITY)
Admission: RE | Admit: 2021-09-19 | Payer: No Typology Code available for payment source | Source: Home / Self Care | Admitting: Internal Medicine

## 2021-09-19 SURGERY — COLONOSCOPY WITH PROPOFOL
Anesthesia: Monitor Anesthesia Care

## 2022-01-19 ENCOUNTER — Inpatient Hospital Stay (HOSPITAL_COMMUNITY)
Admission: EM | Admit: 2022-01-19 | Discharge: 2022-01-23 | DRG: 683 | Disposition: A | Discharge status: Home / Self Care | Payer: No Typology Code available for payment source | Attending: Internal Medicine | Admitting: Internal Medicine

## 2022-01-19 ENCOUNTER — Other Ambulatory Visit: Payer: Self-pay

## 2022-01-19 ENCOUNTER — Encounter (HOSPITAL_COMMUNITY): Payer: Self-pay | Admitting: Emergency Medicine

## 2022-01-19 ENCOUNTER — Emergency Department (HOSPITAL_COMMUNITY): Payer: No Typology Code available for payment source

## 2022-01-19 ENCOUNTER — Observation Stay (HOSPITAL_COMMUNITY): Payer: No Typology Code available for payment source

## 2022-01-19 DIAGNOSIS — R634 Abnormal weight loss: Secondary | ICD-10-CM | POA: Diagnosis present

## 2022-01-19 DIAGNOSIS — Z96642 Presence of left artificial hip joint: Secondary | ICD-10-CM | POA: Diagnosis present

## 2022-01-19 DIAGNOSIS — Z8249 Family history of ischemic heart disease and other diseases of the circulatory system: Secondary | ICD-10-CM

## 2022-01-19 DIAGNOSIS — K6289 Other specified diseases of anus and rectum: Secondary | ICD-10-CM

## 2022-01-19 DIAGNOSIS — I5022 Chronic systolic (congestive) heart failure: Secondary | ICD-10-CM | POA: Diagnosis present

## 2022-01-19 DIAGNOSIS — R627 Adult failure to thrive: Secondary | ICD-10-CM

## 2022-01-19 DIAGNOSIS — R7989 Other specified abnormal findings of blood chemistry: Secondary | ICD-10-CM

## 2022-01-19 DIAGNOSIS — Z6823 Body mass index (BMI) 23.0-23.9, adult: Secondary | ICD-10-CM

## 2022-01-19 DIAGNOSIS — R7401 Elevation of levels of liver transaminase levels: Secondary | ICD-10-CM

## 2022-01-19 DIAGNOSIS — K648 Other hemorrhoids: Secondary | ICD-10-CM | POA: Diagnosis present

## 2022-01-19 DIAGNOSIS — R109 Unspecified abdominal pain: Secondary | ICD-10-CM

## 2022-01-19 DIAGNOSIS — N179 Acute kidney failure, unspecified: Secondary | ICD-10-CM | POA: Diagnosis not present

## 2022-01-19 DIAGNOSIS — I10 Essential (primary) hypertension: Secondary | ICD-10-CM | POA: Diagnosis present

## 2022-01-19 DIAGNOSIS — I951 Orthostatic hypotension: Secondary | ICD-10-CM | POA: Diagnosis present

## 2022-01-19 DIAGNOSIS — K551 Chronic vascular disorders of intestine: Secondary | ICD-10-CM | POA: Diagnosis present

## 2022-01-19 DIAGNOSIS — B182 Chronic viral hepatitis C: Secondary | ICD-10-CM | POA: Diagnosis present

## 2022-01-19 DIAGNOSIS — Z888 Allergy status to other drugs, medicaments and biological substances status: Secondary | ICD-10-CM

## 2022-01-19 DIAGNOSIS — Z20822 Contact with and (suspected) exposure to covid-19: Secondary | ICD-10-CM | POA: Diagnosis present

## 2022-01-19 DIAGNOSIS — I252 Old myocardial infarction: Secondary | ICD-10-CM

## 2022-01-19 DIAGNOSIS — I700 Atherosclerosis of aorta: Secondary | ICD-10-CM | POA: Diagnosis present

## 2022-01-19 DIAGNOSIS — R778 Other specified abnormalities of plasma proteins: Secondary | ICD-10-CM | POA: Diagnosis not present

## 2022-01-19 DIAGNOSIS — K559 Vascular disorder of intestine, unspecified: Secondary | ICD-10-CM | POA: Diagnosis present

## 2022-01-19 DIAGNOSIS — M109 Gout, unspecified: Secondary | ICD-10-CM | POA: Diagnosis present

## 2022-01-19 DIAGNOSIS — K5730 Diverticulosis of large intestine without perforation or abscess without bleeding: Secondary | ICD-10-CM | POA: Diagnosis present

## 2022-01-19 DIAGNOSIS — I110 Hypertensive heart disease with heart failure: Secondary | ICD-10-CM | POA: Diagnosis present

## 2022-01-19 DIAGNOSIS — R000 Tachycardia, unspecified: Secondary | ICD-10-CM | POA: Diagnosis present

## 2022-01-19 DIAGNOSIS — Z8546 Personal history of malignant neoplasm of prostate: Secondary | ICD-10-CM

## 2022-01-19 DIAGNOSIS — E876 Hypokalemia: Secondary | ICD-10-CM | POA: Diagnosis not present

## 2022-01-19 DIAGNOSIS — K219 Gastro-esophageal reflux disease without esophagitis: Secondary | ICD-10-CM | POA: Diagnosis present

## 2022-01-19 DIAGNOSIS — I429 Cardiomyopathy, unspecified: Secondary | ICD-10-CM

## 2022-01-19 DIAGNOSIS — Z8719 Personal history of other diseases of the digestive system: Secondary | ICD-10-CM

## 2022-01-19 DIAGNOSIS — F1210 Cannabis abuse, uncomplicated: Secondary | ICD-10-CM

## 2022-01-19 DIAGNOSIS — I248 Other forms of acute ischemic heart disease: Secondary | ICD-10-CM | POA: Diagnosis present

## 2022-01-19 DIAGNOSIS — Z923 Personal history of irradiation: Secondary | ICD-10-CM

## 2022-01-19 DIAGNOSIS — Z823 Family history of stroke: Secondary | ICD-10-CM

## 2022-01-19 DIAGNOSIS — R194 Change in bowel habit: Secondary | ICD-10-CM

## 2022-01-19 DIAGNOSIS — F1020 Alcohol dependence, uncomplicated: Secondary | ICD-10-CM | POA: Diagnosis present

## 2022-01-19 DIAGNOSIS — D509 Iron deficiency anemia, unspecified: Secondary | ICD-10-CM | POA: Diagnosis present

## 2022-01-19 DIAGNOSIS — K449 Diaphragmatic hernia without obstruction or gangrene: Secondary | ICD-10-CM | POA: Diagnosis present

## 2022-01-19 DIAGNOSIS — E860 Dehydration: Secondary | ICD-10-CM | POA: Diagnosis present

## 2022-01-19 LAB — CBC WITH DIFFERENTIAL/PLATELET
Abs Immature Granulocytes: 0.02 10*3/uL (ref 0.00–0.07)
Basophils Absolute: 0 10*3/uL (ref 0.0–0.1)
Basophils Relative: 0 %
Eosinophils Absolute: 0 10*3/uL (ref 0.0–0.5)
Eosinophils Relative: 0 %
HCT: 41 % (ref 39.0–52.0)
Hemoglobin: 13.9 g/dL (ref 13.0–17.0)
Immature Granulocytes: 0 %
Lymphocytes Relative: 26 %
Lymphs Abs: 1.8 10*3/uL (ref 0.7–4.0)
MCH: 32.1 pg (ref 26.0–34.0)
MCHC: 33.9 g/dL (ref 30.0–36.0)
MCV: 94.7 fL (ref 80.0–100.0)
Monocytes Absolute: 0.8 10*3/uL (ref 0.1–1.0)
Monocytes Relative: 11 %
Neutro Abs: 4.3 10*3/uL (ref 1.7–7.7)
Neutrophils Relative %: 63 %
Platelets: 179 10*3/uL (ref 150–400)
RBC: 4.33 MIL/uL (ref 4.22–5.81)
RDW: 14.5 % (ref 11.5–15.5)
WBC: 7 10*3/uL (ref 4.0–10.5)
nRBC: 0.4 % — ABNORMAL HIGH (ref 0.0–0.2)

## 2022-01-19 LAB — RESP PANEL BY RT-PCR (FLU A&B, COVID) ARPGX2
Influenza A by PCR: NEGATIVE
Influenza B by PCR: NEGATIVE
SARS Coronavirus 2 by RT PCR: NEGATIVE

## 2022-01-19 LAB — URINALYSIS, COMPLETE (UACMP) WITH MICROSCOPIC
Bacteria, UA: NONE SEEN
Glucose, UA: NEGATIVE mg/dL
Hgb urine dipstick: NEGATIVE
Ketones, ur: 5 mg/dL — AB
Leukocytes,Ua: NEGATIVE
Nitrite: NEGATIVE
Protein, ur: 100 mg/dL — AB
Specific Gravity, Urine: 1.028 (ref 1.005–1.030)
pH: 5 (ref 5.0–8.0)

## 2022-01-19 LAB — MAGNESIUM: Magnesium: 1 mg/dL — ABNORMAL LOW (ref 1.7–2.4)

## 2022-01-19 LAB — COMPREHENSIVE METABOLIC PANEL
ALT: 42 U/L (ref 0–44)
AST: 58 U/L — ABNORMAL HIGH (ref 15–41)
Albumin: 4.5 g/dL (ref 3.5–5.0)
Alkaline Phosphatase: 65 U/L (ref 38–126)
Anion gap: 15 (ref 5–15)
BUN: 12 mg/dL (ref 8–23)
CO2: 24 mmol/L (ref 22–32)
Calcium: 8.6 mg/dL — ABNORMAL LOW (ref 8.9–10.3)
Chloride: 99 mmol/L (ref 98–111)
Creatinine, Ser: 2.88 mg/dL — ABNORMAL HIGH (ref 0.61–1.24)
GFR, Estimated: 23 mL/min — ABNORMAL LOW (ref >60)
Glucose, Bld: 114 mg/dL — ABNORMAL HIGH (ref 70–99)
Potassium: 2.7 mmol/L — CL (ref 3.5–5.1)
Sodium: 138 mmol/L (ref 135–145)
Total Bilirubin: 1.4 mg/dL — ABNORMAL HIGH (ref 0.3–1.2)
Total Protein: 9.4 g/dL — ABNORMAL HIGH (ref 6.5–8.1)

## 2022-01-19 LAB — ETHANOL: Alcohol, Ethyl (B): <10 mg/dL (ref <10)

## 2022-01-19 LAB — T4, FREE: Free T4: 0.97 ng/dL (ref 0.61–1.12)

## 2022-01-19 LAB — TROPONIN I (HIGH SENSITIVITY)
Troponin I (High Sensitivity): 74 ng/L — ABNORMAL HIGH (ref <18)
Troponin I (High Sensitivity): 62 ng/L — ABNORMAL HIGH (ref <18)

## 2022-01-19 LAB — D-DIMER, QUANTITATIVE: D-Dimer, Quant: 0.42 ug/mL-FEU (ref 0.00–0.50)

## 2022-01-19 LAB — FOLATE: Folate: 15.5 ng/mL (ref >5.9)

## 2022-01-19 LAB — CK: Total CK: 129 U/L (ref 49–397)

## 2022-01-19 LAB — VITAMIN B12: Vitamin B-12: 894 pg/mL (ref 180–914)

## 2022-01-19 LAB — LACTIC ACID, PLASMA
Lactic Acid, Venous: 2.2 mmol/L (ref 0.5–1.9)
Lactic Acid, Venous: 3 mmol/L (ref 0.5–1.9)

## 2022-01-19 LAB — LIPASE, BLOOD: Lipase: 48 U/L (ref 11–51)

## 2022-01-19 LAB — TSH: TSH: 1.858 u[IU]/mL (ref 0.350–4.500)

## 2022-01-19 MED ORDER — POTASSIUM CHLORIDE CRYS ER 20 MEQ PO TBCR
40.0000 meq | EXTENDED_RELEASE_TABLET | Freq: Once | ORAL | Status: AC
  Administered 2022-01-19: 40 meq via ORAL
  Filled 2022-01-19: qty 2

## 2022-01-19 MED ORDER — POTASSIUM CHLORIDE IN NACL 20-0.9 MEQ/L-% IV SOLN: INTRAVENOUS | Status: DC

## 2022-01-19 MED ORDER — LORATADINE 10 MG PO TABS
10.0000 mg | ORAL_TABLET | Freq: Every day | ORAL | Status: DC
  Administered 2022-01-19 – 2022-01-23 (×5): 10 mg via ORAL
  Filled 2022-01-19 (×5): qty 1

## 2022-01-19 MED ORDER — ONDANSETRON HCL 4 MG PO TABS: 4.0000 mg | ORAL_TABLET | Freq: Four times a day (QID) | ORAL | Status: DC | PRN

## 2022-01-19 MED ORDER — ASPIRIN 81 MG PO CHEW
81.0000 mg | CHEWABLE_TABLET | Freq: Once | ORAL | Status: AC
Start: 2022-01-19 — End: 2022-01-19
  Administered 2022-01-19: 81 mg via ORAL
  Filled 2022-01-19: qty 1

## 2022-01-19 MED ORDER — LACTATED RINGERS IV BOLUS
1000.0000 mL | Freq: Once | INTRAVENOUS | Status: AC
  Administered 2022-01-19: 1000 mL via INTRAVENOUS

## 2022-01-19 MED ORDER — ONDANSETRON HCL 4 MG/2ML IJ SOLN: 4.0000 mg | Freq: Four times a day (QID) | INTRAMUSCULAR | Status: DC | PRN

## 2022-01-19 MED ORDER — SODIUM CHLORIDE 0.9 % IV BOLUS
500.0000 mL | Freq: Once | INTRAVENOUS | Status: AC
  Administered 2022-01-19: 500 mL via INTRAVENOUS

## 2022-01-19 MED ORDER — ACETAMINOPHEN 325 MG PO TABS: 650.0000 mg | ORAL_TABLET | Freq: Four times a day (QID) | ORAL | Status: DC | PRN

## 2022-01-19 MED ORDER — PANTOPRAZOLE SODIUM 40 MG PO TBEC
40.0000 mg | DELAYED_RELEASE_TABLET | Freq: Every day | ORAL | Status: DC
  Administered 2022-01-19 – 2022-01-23 (×5): 40 mg via ORAL
  Filled 2022-01-19 (×5): qty 1

## 2022-01-19 MED ORDER — POTASSIUM CHLORIDE CRYS ER 20 MEQ PO TBCR
20.0000 meq | EXTENDED_RELEASE_TABLET | Freq: Two times a day (BID) | ORAL | Status: DC
  Administered 2022-01-19: 20 meq via ORAL
  Filled 2022-01-19 (×2): qty 1

## 2022-01-19 MED ORDER — MAGNESIUM SULFATE 2 GM/50ML IV SOLN
2.0000 g | Freq: Once | INTRAVENOUS | Status: AC
  Administered 2022-01-19: 2 g via INTRAVENOUS
  Filled 2022-01-19: qty 50

## 2022-01-19 MED ORDER — HEPARIN SODIUM (PORCINE) 5000 UNIT/ML IJ SOLN
5000.0000 [IU] | Freq: Three times a day (TID) | INTRAMUSCULAR | Status: AC
  Administered 2022-01-19 – 2022-01-22 (×8): 5000 [IU] via SUBCUTANEOUS
  Filled 2022-01-19 (×9): qty 1

## 2022-01-19 MED ORDER — MIRTAZAPINE 15 MG PO TABS
15.0000 mg | ORAL_TABLET | Freq: Every day | ORAL | Status: DC
  Administered 2022-01-19 – 2022-01-22 (×4): 15 mg via ORAL
  Filled 2022-01-19 (×4): qty 1

## 2022-01-19 MED ORDER — THIAMINE MONONITRATE 100 MG PO TABS
100.0000 mg | ORAL_TABLET | Freq: Every day | ORAL | Status: DC
  Administered 2022-01-19 – 2022-01-23 (×5): 100 mg via ORAL
  Filled 2022-01-19 (×5): qty 1

## 2022-01-19 MED ORDER — POTASSIUM CHLORIDE 10 MEQ/100ML IV SOLN
10.0000 meq | Freq: Once | INTRAVENOUS | Status: AC
  Administered 2022-01-19: 10 meq via INTRAVENOUS
  Filled 2022-01-19: qty 100

## 2022-01-19 MED ORDER — FLUTICASONE PROPIONATE 50 MCG/ACT NA SUSP: 2.0000 | Freq: Every day | NASAL | Status: DC | PRN

## 2022-01-19 MED ORDER — ACETAMINOPHEN 650 MG RE SUPP: 650.0000 mg | Freq: Four times a day (QID) | RECTAL | Status: DC | PRN

## 2022-01-19 NOTE — Assessment & Plan Note (Signed)
Patient continues to drink beers 2 to 3 days/week Start thiamine No signs of withdrawal

## 2022-01-19 NOTE — Assessment & Plan Note (Signed)
Holding lisinopril in the setting of AKI and soft BP Coreg started>>increased to 6.25 bid

## 2022-01-19 NOTE — ED Triage Notes (Addendum)
Patient c/o shortness of breath that started x4 days ago. Denies any chest pain. Per patient worse with exertion. Denies hx of asthma or COPD. Patient also states that he has had loss of appetite x1 month with weight loss of 15lbs. Denies any nausea or vomiting but has had diarrhea and constant headache.

## 2022-01-19 NOTE — Hospital Course (Signed)
71 year old male with a history of hypertension, prostate cancer status post radiotherapy, GERD, and gouty arthritis presenting with 2-week history of generalized weakness, dyspnea on exertion, and dizziness.  The patient states that he has had decreased oral intake for the past month.  His anorexia has worsened in the last 2 weeks.  He states that every time he eats something solid he has some loose stools.  He denies any hematochezia or melena, but he states that he has some upper abdominal pain right greater than left upper quadrant.  He denies any nausea, vomiting, fevers, chills.  He has some occasional dysuria.  In the last 4 days, his dyspnea exertion and generalized weakness have worsened.  As result, he presented for further evaluation and treatment. In particular, the patient denies any headache, visual disturbance, focal extremity weakness, chest pain, palpitations, coughing, hemoptysis.  He has not been started on any new medications.  He states that he smokes marijuana on a daily basis.  He denies any illicit drug use.  He states that he drinks a beer or 2 approximately 2 to 3 days/week. In the emergency department, the patient was afebrile and hemodynamically stable.  He was tachycardic in the 110s.  Blood pressure was soft in the 100s.  Oxygen saturation was 100% room air.  WBC 7.0, hemoglobin 13.9, platelets 179,000.  AST 58, ALT 42, alk phosphatase 65, total bilirubin 1.4.  BMP showed a sodium 138, potassium 2.7, bicarbonate 24, BUN 12, creatinine 2.88.  COVID PCR was negative.  Chest x-ray was negative.  EKG shows sinus tachycardia with T wave inversions V4-V5, 1 and 3.  Troponin was 74.  D-dimer 0.42.  Patient was given 2 L LR and 50 milli equivalents of KCl in the emergency department.  Overall, the patient clinically improved.  Work-up including echocardiogram showed EF 45 to 50%.  Cardiology was consulted for the patient's new cardiomyopathy.  He remained clinically euvolemic.  For his  abdominal pain, CT abdomen was obtained and showed proctitis.  He was felt to have mesenteric ischemia.  GI was consulted.

## 2022-01-19 NOTE — ED Notes (Signed)
EDP aware of patient's orthostatic vitals.

## 2022-01-19 NOTE — ED Notes (Signed)
Date and time results received: 01/19/22 10:18 AM  Test: Lactic acid Critical Value: 2.2  Name of Provider Notified: Dr. Ernesto Rutherford  Orders Received? Or Actions Taken?: see orders

## 2022-01-19 NOTE — Progress Notes (Signed)
Pt arrived to floor oriented to room and call light. Patient education video started. Pt alert and oriented no complaints at this present time. RN informed patient has arrived. Will continue to monitor patient.

## 2022-01-19 NOTE — Assessment & Plan Note (Signed)
Replete Check magnesium 

## 2022-01-19 NOTE — ED Notes (Signed)
Patient now reports to staff that he also has dizziness upon standing and having cramps in flanks and legs bilaterally.

## 2022-01-19 NOTE — ED Notes (Signed)
Date and time results received: 01/19/22 9:21 AM   Test: K Critical Value: 2.7  Name of Provider Notified: Dr. Ernesto Rutherford  Orders Received? Or Actions Taken?: see orders

## 2022-01-19 NOTE — Assessment & Plan Note (Addendum)
Likely due to alcohol use Trend CT abdomen pelvis--proctitis; diverticulosis Hep B surface antigen Hep C RNA--neg

## 2022-01-19 NOTE — ED Provider Notes (Signed)
[facility] EMERGENCY DEPARTMENT Provider Note   CSN: [id number] Arrival date & time: 01/19/22  4765     History  Chief Complaint  Patient presents with  . Shortness of Breath    Jonathan Benitez is a 71 y.o. male.  Patient is a 71 year old male with a past medical history of gout and previous prostate cancer several years ago status post radiation therapy presenting to the emergency department with shortness of breath and decreased appetite.  The patient states that he has had a worsening appetite over the last month and has had about a 5 pound weight loss.  He denies any associated nausea, vomiting or abdominal pain.  He states over the last 4 days he started to feel short of breath on exertion.  He denies any associated chest pain.  He denies any fevers or cough.  He denies any lower extremity swelling.  He denies any history of blood clots, recent hospitalizations or surgeries, recent long travels in the car plane.  He also reports that he is having muscle cramps all over.  The history is provided by the patient.  Shortness of Breath      Home Medications Prior to Admission medications   Medication Sig Start Date End Date Taking? Authorizing Provider  allopurinol (ZYLOPRIM) 100 MG tablet Take 1 tablet (100 mg total) by mouth daily. Patient not taking: Reported on 09/13/2021 06/19/21   Lindell Spar, MD  cetirizine (ZYRTEC) 10 MG tablet Take 10 mg by mouth daily.    [provider]  colchicine 0.6 MG tablet TAKE 2 TABLETS TODAY; THEN 1 TABLET 1 HOUR LATER. CONTINUE 1 TABLET DAILY UNTIL FLARE RESOLVES. Patient taking differently: Take 0.6 mg by mouth See admin instructions. TAKE 2 TABLETS TODAY; THEN 1 TABLET 1 HOUR LATER. CONTINUE 1 TABLET DAILY UNTIL FLARE RESOLVES. 06/19/21   Lindell Spar, MD  cyclobenzaprine (FLEXERIL) 5 MG tablet Take 1 tablet (5 mg total) by mouth 3 (three) times daily as needed for muscle spasms. Patient not taking: Reported on 09/13/2021 03/01/21    Lindell Spar, MD  diclofenac Sodium (VOLTAREN) 1 % GEL Apply 2 g topically 4 (four) times daily. Patient taking differently: Apply 2 g topically daily as needed (pain). 05/01/20   Corena Herter, PA-C  fluticasone (FLONASE) 50 MCG/ACT nasal spray Place 2 sprays into both nostrils daily as needed for allergies.     [provider]  lisinopril (ZESTRIL) 20 MG tablet Take 20 mg by mouth daily.    [provider]  mirtazapine (REMERON) 15 MG tablet Take 1 tablet (15 mg total) by mouth at bedtime. 09/07/20   Lindell Spar, MD  omeprazole (PRILOSEC) 40 MG capsule Take 1 capsule (40 mg total) by mouth daily. Patient not taking: Reported on 09/13/2021 06/30/20   Noreene Larsson, NP  polyethylene glycol-electrolytes (TRILYTE) 420 g solution Take 4,000 mLs by mouth as directed. 08/22/21   Rehman, Mechele Dawley, MD  potassium chloride SA (KLOR-CON) 20 MEQ tablet Take 2 tablets (40 mEq total) by mouth daily. Patient taking differently: Take 20 mEq by mouth 2 (two) times daily. 04/27/20   Elnora Morrison, MD  predniSONE (DELTASONE) 10 MG tablet Take 2 tablets (20 mg total) by mouth daily. Patient not taking: Reported on 09/13/2021 05/30/21   Milton Ferguson, MD  TRAZODONE HCL PO Take 1-2 tablets by mouth daily as needed (sleep).    [provider]      Allergies    Nsaids  Review of Systems   Review of Systems  Respiratory:  Positive for shortness of breath.     Physical Exam Updated Vital Signs BP 108/74 (BP Location: Left Arm)   Pulse (!) 119   Temp 98 F (36.7 C) (Oral)   Resp 14   Ht 6' (1.829 m)   Wt 79.4 kg   SpO2 100%   BMI 23.73 kg/m  Physical Exam Vitals and nursing note reviewed.  Constitutional:      General: He is not in acute distress.    Appearance: He is well-developed.  HENT:     Head: Normocephalic and atraumatic.     Mouth/Throat:     Mouth: Mucous membranes are moist.     Comments: Mildly dry lips Eyes:     Extraocular Movements: Extraocular  movements intact.     Pupils: Pupils are equal, round, and reactive to light.  Cardiovascular:     Rate and Rhythm: Regular rhythm. Tachycardia present.     Pulses: Normal pulses.     Heart sounds: Normal heart sounds.  Pulmonary:     Effort: Pulmonary effort is normal.     Breath sounds: Normal breath sounds.  Abdominal:     Palpations: Abdomen is soft.     Tenderness: There is no abdominal tenderness.  Musculoskeletal:        General: Normal range of motion.     Cervical back: Normal range of motion and neck supple.     Right lower leg: No edema.     Left lower leg: No edema.  Skin:    General: Skin is warm and dry.  Neurological:     General: No focal deficit present.     Mental Status: He is alert and oriented to person, place, and time.  Psychiatric:        Mood and Affect: Mood normal.        Behavior: Behavior normal.     ED Results / Procedures / Treatments   Labs (all labs ordered are listed, but only abnormal results are displayed) Labs Reviewed  CBC WITH DIFFERENTIAL/PLATELET - Abnormal; Notable for the following components:      Result Value   nRBC 0.4 (*)    All other components within normal limits  COMPREHENSIVE METABOLIC PANEL - Abnormal; Notable for the following components:   Potassium 2.7 (*)    Glucose, Bld 114 (*)    Creatinine, Ser 2.88 (*)    Calcium 8.6 (*)    Total Protein 9.4 (*)    AST 58 (*)    Total Bilirubin 1.4 (*)    GFR, Estimated 23 (*)    All other components within normal limits  TROPONIN I (HIGH SENSITIVITY) - Abnormal; Notable for the following components:   Troponin I (High Sensitivity) 74 (*)    All other components within normal limits  RESP PANEL BY RT-PCR (FLU A&B, COVID) ARPGX2  D-DIMER, QUANTITATIVE  LACTIC ACID, PLASMA  LACTIC ACID, PLASMA  MAGNESIUM  URINALYSIS, ROUTINE W REFLEX MICROSCOPIC    EKG EKG Interpretation  Date/Time:  Saturday January 19 2022 07:51:23 EDT Ventricular Rate:  119 PR  Interval:  150 QRS Duration: 93 QT Interval:  327 QTC Calculation: 461 R Axis:   -34 Text Interpretation: Sinus tachycardia Left axis deviation Nonspecific T abnormalities, lateral leads New lateral T-wave inversions compared to prior EKG Confirmed by Oneal Deputy [phone]) on 01/19/2022 9:29:32 AM  Radiology DG Chest Port 1 View  Result Date: 01/19/2022 CLINICAL DATA:  Shortness of  breath. EXAM: PORTABLE CHEST 1 VIEW COMPARISON:  04/26/2020 FINDINGS: 0849 hours. The lungs are clear without focal pneumonia, edema, pneumothorax or pleural effusion. The cardiopericardial silhouette is within normal limits for size. Telemetry leads overlie the chest. IMPRESSION: No active disease. Electronically Signed   By: Misty Stanley M.D.   On: 01/19/2022 08:58    Procedures Procedures    Medications Ordered in ED Medications  lactated ringers bolus 1,000 mL (has no administration in time range)  potassium chloride SA (KLOR-CON M) CR tablet 40 mEq (has no administration in time range)  potassium chloride 10 mEq in 100 mL IVPB (has no administration in time range)  aspirin chewable tablet 81 mg (has no administration in time range)  lactated ringers bolus 1,000 mL (has no administration in time range)    ED Course/ Medical Decision Making/ A&P Clinical Course as of 01/19/22 1059  Sat Jan 19, 2022  0930 Patient's labs show an AKI with creatinine of 2.8 from baseline around 0.8 as well as hypokalemia.  He will continue to be fluid resuscitated and will be given potassium repletion.  Magnesium level was added on.  The patient's troponin is mildly elevated at 74 with some new T wave inversions laterally which may explain his shortness of breath.  This could be secondary to his dehydration.  D-dimer is within normal range making PE unlikely cause of his shortness of breath.  Chest x-ray shows no acute disease to explain his shortness of breath.  Patient will require admission for his AKI and hypokalemia.  [VK]  5956 Patient's magnesium is 1.0 and will be repleted with 2 g of IV mag.  Patient has a mildly elevated lactate likely in the setting of dehydration and will be trended after fluid resuscitation. [VK]    Clinical Course User Index [VK] Ottie Glazier, DO                           Medical Decision Making This patient presents to the ED with chief complaint(s) of shortness of breath and decreased appetite with pertinent past medical history of prior prostate cancer, gout which further complicates the presenting complaint. The complaint involves an extensive differential diagnosis and also carries with it a high risk of complications and morbidity.    The differential diagnosis includes ACS, arrhythmia, PE, pneumonia less likely as no fever or cough, concern for possible tumor or mass effect on his lungs, dehydration, electrolyte abnormality, the patient has no abdominal pain or tenderness and no GI symptoms making intra-abdominal infection less likely  Additional history obtained: Additional history obtained from N/A Records reviewed prior labs  ED Course and Reassessment: Upon patient's arrival to the emergency department he is tachycardic with soft blood pressures.  He is orthostatic positive.  He is in no acute respiratory distress.  He will have labs, EKG and chest x-ray performed to evaluate the cause of his shortness of breath and decreased appetite.  He will be started on a liter of IV fluids with concern for dehydration contributing to his tachycardia and orthostasis in the setting of his decreased appetite.  Independent labs interpretation:  The following labs were independently interpreted: Labs show hypokalemia with an AKI.  Troponin is mildly elevated  Independent visualization of imaging: - I independently visualized the following imaging with scope of interpretation limited to determining acute life threatening conditions related to emergency care: Chest x-ray, which  revealed no acute disease  Consultation: - Consulted or  discussed management/test interpretation w/ external professional: Hospitalist  Consideration for admission or further workup: Patient requires admission for further management of his hypokalemia with AKI in the setting of dehydration as well as for further evaluation of his elevated troponin with T wave inversions Social Determinants of health: N/A    Amount and/or Complexity of Data Reviewed Labs: ordered. Radiology: ordered.  Risk OTC drugs. Prescription drug management. Decision regarding hospitalization.          Final Clinical Impression(s) / ED Diagnoses Final diagnoses:  Hypokalemia  AKI (acute kidney injury) ([place])  Elevated troponin    Rx / DC Orders ED Discharge Orders     None         Ottie Glazier, DO 01/19/22 [phone]

## 2022-01-19 NOTE — Assessment & Plan Note (Signed)
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

## 2022-01-19 NOTE — H&P (Addendum)
History and Physical    Patient: Jonathan Benitez BRA:[id number] DOB: 10-24-1950 DOA: 01/19/2022 DOS: the patient was seen and examined on 01/19/2022 PCP: Lindell Spar, MD  Patient coming from: Home  Chief Complaint:  Chief Complaint  Patient presents with   Shortness of Breath   HPI: Jonathan Benitez is a 71 year old male with a history of hypertension, prostate cancer status post radiotherapy, GERD, and gouty arthritis presenting with 2-week history of generalized weakness, dyspnea on exertion, and dizziness.  The patient states that he has had decreased oral intake for the past month.  His anorexia has worsened in the last 2 weeks.  He states that every time he eats something solid he has some loose stools.  He denies any hematochezia or melena, but he states that he has some upper abdominal pain right greater than left upper quadrant.  He denies any nausea, vomiting, fevers, chills.  He has some occasional dysuria.  In the last 4 days, his dyspnea exertion and generalized weakness have worsened.  As result, he presented for further evaluation and treatment. In particular, the patient denies any headache, visual disturbance, focal extremity weakness, chest pain, palpitations, coughing, hemoptysis.  He has not been started on any new medications.  He states that he smokes marijuana on a daily basis.  He denies any illicit drug use.  He states that he drinks a beer or 2 approximately 2 to 3 days/week. In the emergency department, the patient was afebrile and hemodynamically stable.  He was tachycardic in the 110s.  Blood pressure was soft in the 100s.  Oxygen saturation was 100% room air.  WBC 7.0, hemoglobin 13.9, platelets 179,000.  AST 58, ALT 42, alk phosphatase 65, total bilirubin 1.4.  BMP showed a sodium 138, potassium 2.7, bicarbonate 24, BUN 12, creatinine 2.88.  COVID PCR was negative.  Chest x-ray was negative.  EKG shows sinus tachycardia with T wave inversions V4-V5, 1 and 3.  Troponin  was 74.  D-dimer 0.42.  Patient was given 2 L LR and 50 milli equivalents of KCl in the emergency department.   Review of Systems: As mentioned in the history of present illness. All other systems reviewed and are negative. Past Medical History:  Diagnosis Date   GERD (gastroesophageal reflux disease)    Hepatitis C    Hypertension    Prostate CA ([place])    S/P hip replacement, left 11/19/2017   Past Surgical History:  Procedure Laterality Date   COLONOSCOPY N/A 03/30/2018   Procedure: COLONOSCOPY;  Surgeon: Rogene Houston, MD;  Location: [facility];  Service: Endoscopy;  Laterality: N/A;  12:45   [place]   POLYPECTOMY  03/30/2018   Procedure: POLYPECTOMY;  Surgeon: Rogene Houston, MD;  Location: [facility];  Service: Endoscopy;;  colon   PROSTATE BIOPSY     positive for cancer   TOTAL HIP ARTHROPLASTY Left    [facility] 11/07/17 Dr. Florian Buff   Social History:  reports that he has never smoked. He has never used smokeless tobacco. He reports that he does not currently use alcohol. He reports current drug use. Drug: Marijuana.  Allergies  Allergen Reactions   Nsaids Other (See Comments)     Anemia, Gastrointestinal hemorrhage    Family History  Problem Relation Age of Onset   Hypertension Mother    Hypertension Father    Stroke Father     Prior to Admission medications   Medication Sig  Start Date End Date Taking? Authorizing Provider  allopurinol (ZYLOPRIM) 100 MG tablet Take 1 tablet (100 mg total) by mouth daily. Patient not taking: Reported on 09/13/2021 06/19/21   Lindell Spar, MD  cetirizine (ZYRTEC) 10 MG tablet Take 10 mg by mouth daily.    [provider]  colchicine 0.6 MG tablet TAKE 2 TABLETS TODAY; THEN 1 TABLET 1 HOUR LATER. CONTINUE 1 TABLET DAILY UNTIL FLARE RESOLVES. Patient taking differently: Take 0.6 mg by mouth See admin instructions. TAKE 2 TABLETS TODAY; THEN 1 TABLET 1 HOUR LATER. CONTINUE 1 TABLET DAILY UNTIL  FLARE RESOLVES. 06/19/21   Lindell Spar, MD  cyclobenzaprine (FLEXERIL) 5 MG tablet Take 1 tablet (5 mg total) by mouth 3 (three) times daily as needed for muscle spasms. Patient not taking: Reported on 09/13/2021 03/01/21   Lindell Spar, MD  diclofenac Sodium (VOLTAREN) 1 % GEL Apply 2 g topically 4 (four) times daily. Patient taking differently: Apply 2 g topically daily as needed (pain). 05/01/20   Corena Herter, PA-C  fluticasone (FLONASE) 50 MCG/ACT nasal spray Place 2 sprays into both nostrils daily as needed for allergies.     [provider]  lisinopril (ZESTRIL) 20 MG tablet Take 20 mg by mouth daily.    [provider]  mirtazapine (REMERON) 15 MG tablet Take 1 tablet (15 mg total) by mouth at bedtime. 09/07/20   Lindell Spar, MD  omeprazole (PRILOSEC) 40 MG capsule Take 1 capsule (40 mg total) by mouth daily. Patient not taking: Reported on 09/13/2021 06/30/20   Noreene Larsson, NP  polyethylene glycol-electrolytes (TRILYTE) 420 g solution Take 4,000 mLs by mouth as directed. 08/22/21   Rehman, Mechele Dawley, MD  potassium chloride SA (KLOR-CON) 20 MEQ tablet Take 2 tablets (40 mEq total) by mouth daily. Patient taking differently: Take 20 mEq by mouth 2 (two) times daily. 04/27/20   Elnora Morrison, MD  predniSONE (DELTASONE) 10 MG tablet Take 2 tablets (20 mg total) by mouth daily. Patient not taking: Reported on 09/13/2021 05/30/21   Milton Ferguson, MD  TRAZODONE HCL PO Take 1-2 tablets by mouth daily as needed (sleep).    [provider]    Physical Exam: Vitals:   01/19/22 0751 01/19/22 0900 01/19/22 1015  BP: 108/74 100/75 117/88  Pulse: (!) 119  (!) 115  Resp: 14    Temp: 98 F (36.7 C)    TempSrc: Oral    SpO2: 100%  99%  Weight: 79.4 kg    Height: 6' (1.829 m)     GENERAL:  A&O x 3, NAD, well developed, cooperative, follows commands HEENT: [place]/AT, No thrush, No icterus, No oral ulcers Neck:  No neck mass, No meningismus, soft, supple CV: RRR,  no S3, no S4, no rub, no JVD Lungs:  CTA, no wheeze, no rhonchi, good air movement Abd: soft/NT +BS, nondistended Ext: No edema, no lymphangitis, no cyanosis, no rashes Neuro:  CN II-XII intact, strength 4/5 in RUE, RLE, strength 4/5 LUE, LLE; sensation intact bilateral; no dysmetria; babinski equivocal  Data Reviewed: Data reviewed above in history  Assessment and Plan: * AKI (acute kidney injury) ([place]) Baseline creatinine 0.8-1.0 Presented with serum creatinine 2.8 Secondary to volume depletion Continue IV fluids  Transaminasemia Likely due to alcohol use and chronic hep C Trend CT abdomen pelvis Hep B surface antigen Hep C RNA  Abdominal pain CT abdomen and pelvis  Elevated troponin Demand ischemia No chest pain presently Personally reviewed EKG--sinus rhythm, nonspecific  T wave changes Echocardiogram  Hypokalemia Replete Check magnesium  Marijuana abuse Cessation discussed Patient likely has a component of cannabis hyperemesis syndrome  Failure to thrive in adult TSH S17 Folic acid PT evaluation Urinalysis UDS  Alcoholism ([place]) Patient continues to drink beers 2 to 3 days/week Start thiamine  HTN (hypertension) Holding lisinopril in the setting of AKI and soft BP      Advance Care Planning: FULL  Consults: none Family Communication: none  Severity of Illness: The appropriate patient status for this patient is OBSERVATION. Observation status is judged to be reasonable and necessary in order to provide the required intensity of service to ensure the patient's safety. The patient's presenting symptoms, physical exam findings, and initial radiographic and laboratory data in the context of their medical condition is felt to place them at decreased risk for further clinical deterioration. Furthermore, it is anticipated that the patient will be medically stable for discharge from the hospital within 2 midnights of admission.   Author: Orson Eva,  MD 01/19/2022 10:33 AM  For on call review [email or web address].

## 2022-01-19 NOTE — Assessment & Plan Note (Addendum)
-  TSH--1.858 -B12--894 -Folic acid--15.5 -Urinalysis--no significant pyuria -Most likely due to mesenteric ischemia -Small multiple meals and feeding supplements recommended -Outpatient follow-up with vascular surgery.

## 2022-01-19 NOTE — Assessment & Plan Note (Signed)
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

## 2022-01-19 NOTE — Assessment & Plan Note (Signed)
Baseline creatinine 0.8-1.0 Presented with serum creatinine 2.8 Secondary to volume depletion Improved/resolved after fluid resuscitation. -Repeat basic metabolic panel follow-up visit to assess stability.

## 2022-01-19 NOTE — Plan of Care (Signed)

## 2022-01-19 NOTE — Assessment & Plan Note (Addendum)
Demand ischemia No chest pain presently Personally reviewed EKG--sinus rhythm, nonspecific T wave changes Echocardiogram

## 2022-01-19 NOTE — ED Notes (Signed)
Date and time results received: 01/19/22 '@11'$ :50   Test: Lactic Acid Critical Value: 3.0  Name of Provider Notified: Dr Tat  Orders Received? Or Actions Taken?: Orders Received - See Orders for details

## 2022-01-20 ENCOUNTER — Observation Stay (HOSPITAL_COMMUNITY): Payer: No Typology Code available for payment source

## 2022-01-20 ENCOUNTER — Other Ambulatory Visit (HOSPITAL_COMMUNITY): Payer: Self-pay | Admitting: *Deleted

## 2022-01-20 DIAGNOSIS — I429 Cardiomyopathy, unspecified: Secondary | ICD-10-CM

## 2022-01-20 DIAGNOSIS — F1020 Alcohol dependence, uncomplicated: Secondary | ICD-10-CM

## 2022-01-20 DIAGNOSIS — I10 Essential (primary) hypertension: Secondary | ICD-10-CM

## 2022-01-20 DIAGNOSIS — E876 Hypokalemia: Secondary | ICD-10-CM | POA: Diagnosis not present

## 2022-01-20 DIAGNOSIS — N179 Acute kidney failure, unspecified: Secondary | ICD-10-CM | POA: Diagnosis not present

## 2022-01-20 DIAGNOSIS — R0609 Other forms of dyspnea: Secondary | ICD-10-CM

## 2022-01-20 DIAGNOSIS — I426 Alcoholic cardiomyopathy: Secondary | ICD-10-CM | POA: Diagnosis not present

## 2022-01-20 LAB — COMPREHENSIVE METABOLIC PANEL
ALT: 28 U/L (ref 0–44)
AST: 37 U/L (ref 15–41)
Albumin: 3.3 g/dL — ABNORMAL LOW (ref 3.5–5.0)
Alkaline Phosphatase: 46 U/L (ref 38–126)
Anion gap: 8 (ref 5–15)
BUN: 8 mg/dL (ref 8–23)
CO2: 25 mmol/L (ref 22–32)
Calcium: 7.6 mg/dL — ABNORMAL LOW (ref 8.9–10.3)
Chloride: 106 mmol/L (ref 98–111)
Creatinine, Ser: 0.99 mg/dL (ref 0.61–1.24)
GFR, Estimated: >60 mL/min (ref >60)
Glucose, Bld: 84 mg/dL (ref 70–99)
Potassium: 2.9 mmol/L — ABNORMAL LOW (ref 3.5–5.1)
Sodium: 139 mmol/L (ref 135–145)
Total Bilirubin: 1.2 mg/dL (ref 0.3–1.2)
Total Protein: 6.7 g/dL (ref 6.5–8.1)

## 2022-01-20 LAB — ECHOCARDIOGRAM COMPLETE
Area-P 1/2: 5.38 cm2
Height: 72 in
S' Lateral: 2.4 cm
Weight: 2800 oz

## 2022-01-20 LAB — MAGNESIUM: Magnesium: 1.2 mg/dL — ABNORMAL LOW (ref 1.7–2.4)

## 2022-01-20 MED ORDER — CARVEDILOL 3.125 MG PO TABS
3.1250 mg | ORAL_TABLET | Freq: Two times a day (BID) | ORAL | Status: DC
  Administered 2022-01-20 – 2022-01-22 (×4): 3.125 mg via ORAL
  Filled 2022-01-20 (×4): qty 1

## 2022-01-20 MED ORDER — POTASSIUM CHLORIDE CRYS ER 20 MEQ PO TBCR
40.0000 meq | EXTENDED_RELEASE_TABLET | Freq: Two times a day (BID) | ORAL | Status: DC
  Administered 2022-01-20 – 2022-01-21 (×3): 40 meq via ORAL
  Filled 2022-01-20 (×3): qty 2

## 2022-01-20 MED ORDER — TRAMADOL HCL 50 MG PO TABS
50.0000 mg | ORAL_TABLET | Freq: Once | ORAL | Status: AC
  Administered 2022-01-20: 50 mg via ORAL
  Filled 2022-01-20: qty 1

## 2022-01-20 MED ORDER — LOSARTAN POTASSIUM 50 MG PO TABS
25.0000 mg | ORAL_TABLET | Freq: Every day | ORAL | Status: DC
  Administered 2022-01-21: 25 mg via ORAL
  Filled 2022-01-20: qty 1

## 2022-01-20 MED ORDER — HYDROCORTISONE ACETATE 25 MG RE SUPP
25.0000 mg | Freq: Two times a day (BID) | RECTAL | Status: DC
  Filled 2022-01-20 (×5): qty 1

## 2022-01-20 MED ORDER — MAGNESIUM SULFATE 4 GM/100ML IV SOLN
4.0000 g | Freq: Once | INTRAVENOUS | Status: AC
  Administered 2022-01-20: 4 g via INTRAVENOUS
  Filled 2022-01-20: qty 100

## 2022-01-20 NOTE — Assessment & Plan Note (Signed)
replete

## 2022-01-20 NOTE — Progress Notes (Signed)
PROGRESS NOTE  Jonathan Benitez YTW:[id number] DOB: 11-11-1950 DOA: 01/19/2022 PCP: Lindell Spar, MD  Brief History:  71 year old male with a history of hypertension, prostate cancer status post radiotherapy, GERD, and gouty arthritis presenting with 2-week history of generalized weakness, dyspnea on exertion, and dizziness.  The patient states that he has had decreased oral intake for the past month.  His anorexia has worsened in the last 2 weeks.  He states that every time he eats something solid he has some loose stools.  He denies any hematochezia or melena, but he states that he has some upper abdominal pain right greater than left upper quadrant.  He denies any nausea, vomiting, fevers, chills.  He has some occasional dysuria.  In the last 4 days, his dyspnea exertion and generalized weakness have worsened.  As result, he presented for further evaluation and treatment. In particular, the patient denies any headache, visual disturbance, focal extremity weakness, chest pain, palpitations, coughing, hemoptysis.  He has not been started on any new medications.  He states that he smokes marijuana on a daily basis.  He denies any illicit drug use.  He states that he drinks a beer or 2 approximately 2 to 3 days/week. In the emergency department, the patient was afebrile and hemodynamically stable.  He was tachycardic in the 110s.  Blood pressure was soft in the 100s.  Oxygen saturation was 100% room air.  WBC 7.0, hemoglobin 13.9, platelets 179,000.  AST 58, ALT 42, alk phosphatase 65, total bilirubin 1.4.  BMP showed a sodium 138, potassium 2.7, bicarbonate 24, BUN 12, creatinine 2.88.  COVID PCR was negative.  Chest x-ray was negative.  EKG shows sinus tachycardia with T wave inversions V4-V5, 1 and 3.  Troponin was 74.  D-dimer 0.42.  Patient was given 2 L LR and 50 milli equivalents of KCl in the emergency department.     Assessment and Plan: * AKI (acute kidney injury) ([place]) Baseline  creatinine 0.8-1.0 Presented with serum creatinine 2.8 Secondary to volume depletion Continue IV fluids>>improved  Cardiomyopathy ([place]) Newly diagnosed this admission 01/20/22 echo--EF 45-50% Start coreg and losartan Cardiology consult Suspect maybe partly related to [place]  Hypomagnesemia replete  Transaminasemia Likely due to alcohol use and chronic hep C Trend CT abdomen pelvis Hep B surface antigen Hep C RNA  Abdominal pain CT abdomen and pelvis>>proctitis, diverticulosis --start anusol HC supp  Elevated troponin Demand ischemia No chest pain presently Personally reviewed EKG--sinus rhythm, nonspecific T wave changes Echo--EF 45-50%, +WMA, trivial MR/TR  Hypokalemia Replete  Marijuana abuse Cessation discussed Patient likely has a component of cannabis hyperemesis syndrome  Failure to thrive in adult RRN--1.657 X03--833 Folic XOVA--91.9 PT evaluation Urinalysis--no significant pyuria  Alcoholism ([place]) Patient continues to drink beers 2 to 3 days/week Start thiamine  HTN (hypertension) Holding lisinopril in the setting of AKI and soft BP       Family Communication:   no Family at bedside  Consultants:  cardiology, GI  Code Status:  FULL   DVT Prophylaxis:  [place] Heparin    Procedures: As Listed in Progress Note Above  Antibiotics: None       Subjective: Patient denies fevers, chills, headache, chest pain, dyspnea, nausea, vomiting, diarrhea, abdominal pain, dysuria, hematuria, hematochezia, and melena. He's feeling better today  Objective: Vitals:   01/19/22 2003 01/20/22 0014 01/20/22 0415 01/20/22 1527  BP: 113/72 117/71 131/86 (!) 168/101  Pulse: 92 94 98 100  Resp:  _0 Temp: 98.4 F (36.9 C) 98.6 F (37 C) 98.4 F (36.9 C) (!) 97.5 F (36.4 C)  TempSrc: Oral Oral Oral Oral  SpO2: 100% 100% 100% 100%  Weight:      Height:        Intake/Output Summary (Last 24 hours) at 01/20/2022 1737 Last data filed at  01/20/2022 1500 Gross per 24 hour  Intake 2275.72 ml  Output --  Net 2275.72 ml   Weight change:  Exam:  General:  Pt is alert, follows commands appropriately, not in acute distress HEENT: No icterus, No thrush, No neck mass, [place]/AT Cardiovascular: RRR, S1/S2, no rubs, no gallops Respiratory: CTA bilaterally, no wheezing, no crackles, no rhonchi Abdomen: Soft/+BS, non tender, non distended, no guarding Extremities: No edema, No lymphangitis, No petechiae, No rashes, no synovitis   Data Reviewed: I have personally reviewed following labs and imaging studies Basic Metabolic Panel: Recent Labs  Lab 01/19/22 0800 01/20/22 0505  NA 138 139  K 2.7* 2.9*  CL 99 106  CO2 24 25  GLUCOSE 114* 84  BUN 12 8  CREATININE 2.88* 0.99  CALCIUM 8.6* 7.6*  MG 1.0* 1.2*   Liver Function Tests: Recent Labs  Lab 01/19/22 0800 01/20/22 0505  AST 58* 37  ALT 42 28  ALKPHOS 65 46  BILITOT 1.4* 1.2  PROT 9.4* 6.7  ALBUMIN 4.5 3.3*   Recent Labs  Lab 01/19/22 1607  LIPASE 48   No results for input(s): "AMMONIA" in the last 168 hours. Coagulation Profile: No results for input(s): "INR", "PROTIME" in the last 168 hours. CBC: Recent Labs  Lab 01/19/22 0800 01/20/22 0505  WBC 7.0 4.9  NEUTROABS 4.3  --   HGB 13.9 11.0*  HCT 41.0 33.1*  MCV 94.7 97.6  PLT 179 125*   Cardiac Enzymes: Recent Labs  Lab 01/19/22 1607  CKTOTAL 129   BNP: Invalid input(s): "POCBNP" CBG: No results for input(s): "GLUCAP" in the last 168 hours. HbA1C: No results for input(s): "HGBA1C" in the last 72 hours. Urine analysis:    Component Value Date/Time   COLORURINE AMBER (A) 01/19/2022 1207   APPEARANCEUR CLOUDY (A) 01/19/2022 1207   LABSPEC 1.028 01/19/2022 1207   PHURINE 5.0 01/19/2022 1207   GLUCOSEU NEGATIVE 01/19/2022 1207   HGBUR NEGATIVE 01/19/2022 1207   BILIRUBINUR SMALL (A) 01/19/2022 1207   KETONESUR 5 (A) 01/19/2022 1207   PROTEINUR 100 (A) 01/19/2022 1207   UROBILINOGEN 0.2  12/17/2013 0825   NITRITE NEGATIVE 01/19/2022 1207   LEUKOCYTESUR NEGATIVE 01/19/2022 1207   Sepsis Labs: _1 (procalcitonin:4,lacticidven:4) ) Recent Results (from the past 240 hour(s))  Resp Panel by RT-PCR (Flu A&B, Covid) Anterior Nasal Swab     Status: None   Collection Time: 01/19/22  8:18 AM   Specimen: Anterior Nasal Swab  Result Value Ref Range Status   SARS Coronavirus 2 by RT PCR NEGATIVE NEGATIVE Final    Comment: (NOTE) SARS-CoV-2 target nucleic acids are NOT DETECTED.  The SARS-CoV-2 RNA is generally detectable in upper respiratory specimens during the acute phase of infection. The lowest concentration of SARS-CoV-2 viral copies this assay can detect is 138 copies/mL. A negative result does not preclude SARS-Cov-2 infection and should not be used as the sole basis for treatment or other patient management decisions. A negative result may occur with  improper specimen collection/handling, submission of specimen other than nasopharyngeal swab, presence of viral mutation(s) within the areas targeted by this assay, and inadequate number of viral copies(<138  copies/mL). A negative result must be combined with clinical observations, patient history, and epidemiological information. The expected result is Negative.  Fact Sheet for Patients:  EntrepreneurPulse.com.au  Fact Sheet for Healthcare Providers:  IncredibleEmployment.be  This test is no t yet approved or cleared by the [place] FDA and  has been authorized for detection and/or diagnosis of SARS-CoV-2 by FDA under an Emergency Use Authorization (EUA). This EUA will remain  in effect (meaning this test can be used) for the duration of the COVID-19 declaration under Section 564(b)(1) of the Act, 21 U.S.C.section 360bbb-3(b)(1), unless the authorization is terminated  or revoked sooner.       Influenza A by PCR NEGATIVE NEGATIVE Final   Influenza B by PCR NEGATIVE  NEGATIVE Final    Comment: (NOTE) The Xpert Xpress SARS-CoV-2/FLU/RSV plus assay is intended as an aid in the diagnosis of influenza from Nasopharyngeal swab specimens and should not be used as a sole basis for treatment. Nasal washings and aspirates are unacceptable for Xpert Xpress SARS-CoV-2/FLU/RSV testing.  Fact Sheet for Patients: EntrepreneurPulse.com.au  Fact Sheet for Healthcare Providers: IncredibleEmployment.be  This test is not yet approved or cleared by the [place] FDA and has been authorized for detection and/or diagnosis of SARS-CoV-2 by FDA under an Emergency Use Authorization (EUA). This EUA will remain in effect (meaning this test can be used) for the duration of the COVID-19 declaration under Section 564(b)(1) of the Act, 21 U.S.C. section 360bbb-3(b)(1), unless the authorization is terminated or revoked.  Performed at [facility], [address] [place], [place] [address]      Scheduled Meds:  carvedilol  3.125 mg Oral BID WC   heparin  5,000 Units Subcutaneous Q8H   hydrocortisone  25 mg Rectal BID   loratadine  10 mg Oral Daily   [START ON 01/21/2022] losartan  25 mg Oral Daily   mirtazapine  15 mg Oral QHS   pantoprazole  40 mg Oral Daily   potassium chloride SA  40 mEq Oral BID   thiamine  100 mg Oral Daily   Continuous Infusions:  Procedures/Studies: ECHOCARDIOGRAM COMPLETE  Result Date: 01/20/2022    ECHOCARDIOGRAM REPORT   Patient Name:   Jonathan Benitez Date of Exam: 01/20/2022 Medical Rec #:  [id number]        Height:       72.0 in Accession #:    [id number]       Weight:       175.0 lb Date of Birth:  1951/02/19        BSA:          2.013 m Patient Age:    17 years         BP:           163/91 mmHg Patient Gender: M                HR:           85 bpm. Exam Location:  [facility] Procedure: 2D Echo, Cardiac Doppler and Color Doppler Indications:    Dyspnea R06.00  History:        Patient has no prior  history of Echocardiogram examinations.                 Risk Factors:Hypertension. Hx of Alcoholism, Marijuana abuse,                 Prostate cancer.  Sonographer:    Alvino Chapel RCS Referring Phys: [phone] Tristan Bramble  IMPRESSIONS  1. Left ventricular ejection fraction, by estimation, is 45 to 50%. The left ventricle has mildly decreased function. The left ventricle demonstrates regional wall motion abnormalities (see scoring diagram/findings for description). There is mild asymmetric left ventricular hypertrophy of the basal-septal segment. There is hypokinesis of the mid-to-apical lateral LV segments, all apical segments, and apex. Indeterminate diastolic filling due to E-A fusion.  2. Right ventricular systolic function is normal. The right ventricular size is normal.  3. Left atrial size was mild to moderately dilated.  4. The mitral valve is abnormal. Trivial mitral valve regurgitation. Moderate to severe mitral annular calcification.  5. The aortic valve is tricuspid. There is mild calcification of the aortic valve. There is mild thickening of the aortic valve. Aortic valve regurgitation is not visualized. Aortic valve sclerosis/calcification is present, without any evidence of aortic stenosis.  6. Aortic dilatation noted. There is borderline dilatation of the aortic root, measuring 37 mm.  7. The inferior vena cava is normal in size with greater than 50% respiratory variability, suggesting right atrial pressure of 3 mmHg. FINDINGS  Left Ventricle: Left ventricular ejection fraction, by estimation, is 45 to 50%. The left ventricle has mildly decreased function. The left ventricle demonstrates regional wall motion abnormalities. The left ventricular internal cavity size was normal in size. There is mild asymmetric left ventricular hypertrophy of the basal-septal segment. There is hypokinesis of the mid-to-apical lateral LV segments, all apical segments and apex. Indeterminate diastolic filling due to E-A fusion.  Right Ventricle: The right ventricular size is normal. No increase in right ventricular wall thickness. Right ventricular systolic function is normal. Left Atrium: Left atrial size was mild to moderately dilated. Right Atrium: Right atrial size was normal in size. Pericardium: There is no evidence of pericardial effusion. Mitral Valve: The mitral valve is abnormal. There is mild thickening of the mitral valve leaflet(s). There is mild calcification of the mitral valve leaflet(s). Moderate to severe mitral annular calcification. Trivial mitral valve regurgitation. Tricuspid Valve: The tricuspid valve is normal in structure. Tricuspid valve regurgitation is trivial. Aortic Valve: The aortic valve is tricuspid. There is mild calcification of the aortic valve. There is mild thickening of the aortic valve. Aortic valve regurgitation is not visualized. Aortic valve sclerosis/calcification is present, without any evidence of aortic stenosis. Pulmonic Valve: The pulmonic valve was normal in structure. Pulmonic valve regurgitation is trivial. Aorta: Aortic dilatation noted. There is borderline dilatation of the aortic root, measuring 37 mm. Venous: The inferior vena cava is normal in size with greater than 50% respiratory variability, suggesting right atrial pressure of 3 mmHg. IAS/Shunts: The atrial septum is grossly normal.  LEFT VENTRICLE PLAX 2D LVIDd:         4.40 cm   Diastology LVIDs:         2.40 cm   LV e' lateral:   9.36 cm/s LV PW:         1.00 cm   LV E/e' lateral: 12.2 LV IVS:        1.00 cm LVOT diam:     2.20 cm LV SV:         62 LV SV Index:   31 LVOT Area:     3.80 cm  RIGHT VENTRICLE RV S prime:     12.90 cm/s TAPSE (M-mode): 2.3 cm LEFT ATRIUM             Index        RIGHT ATRIUM  Index LA diam:        3.20 cm 1.59 cm/m   RA Area:     15.30 cm LA Vol (A2C):   82.5 ml 40.98 ml/m  RA Volume:   39.90 ml  19.82 ml/m LA Vol (A4C):   59.4 ml 29.51 ml/m LA Biplane Vol: 75.3 ml 37.41 ml/m  AORTIC  VALVE LVOT Vmax:   99.00 cm/s LVOT Vmean:  65.000 cm/s LVOT VTI:    0.162 m  AORTA Ao Root diam: 3.70 cm MITRAL VALVE MV Area (PHT): 5.38 cm     SHUNTS MV Decel Time: 141 msec     Systemic VTI:  0.16 m MV E velocity: 114.00 cm/s  Systemic Diam: 2.20 cm Gwyndolyn Kaufman MD Electronically signed by Gwyndolyn Kaufman MD Signature Date/Time: 01/20/2022/1:24:16 PM    Final    CT ABDOMEN PELVIS WO CONTRAST  Result Date: 01/19/2022 CLINICAL DATA:  Abdominal pain for 2 weeks. Anorexia. Weight loss. Personal history of prostate carcinoma. EXAM: CT ABDOMEN AND PELVIS WITHOUT CONTRAST TECHNIQUE: Multidetector CT imaging of the abdomen and pelvis was performed following the standard protocol without IV contrast. RADIATION DOSE REDUCTION: This exam was performed according to the departmental dose-optimization program which includes automated exposure control, adjustment of the mA and/or kV according to patient size and/or use of iterative reconstruction technique. COMPARISON:  01/03/2021 FINDINGS: Lower chest: No acute findings. Hepatobiliary: No mass visualized on this unenhanced exam. Gallbladder is unremarkable. No evidence of biliary ductal dilatation. Pancreas: No mass or inflammatory process visualized on this unenhanced exam. Spleen:  Within normal limits in size. Adrenals/Urinary tract: No evidence of urolithiasis or hydronephrosis. Stomach/Bowel: New small to moderate hiatal hernia is seen. Normal appendix visualized. Mild left colonic diverticulosis is again seen, without evidence of diverticulitis. Mild diffuse rectal wall thickening is noted, suspicious for mild proctitis. Vascular/Lymphatic: No pathologically enlarged lymph nodes identified. No evidence of abdominal aortic aneurysm. Aortic atherosclerotic calcification incidentally noted. Reproductive:  No mass or other significant abnormality. Other:  None. Musculoskeletal: No suspicious bone lesions identified. Left hip prosthesis noted. IMPRESSION: Mild  diffuse rectal wall thickening, suspicious for proctitis. Colonic diverticulosis, without radiographic evidence of diverticulitis. New small to moderate hiatal hernia. Electronically Signed   By: Marlaine Hind M.D.   On: 01/19/2022 20:07   DG Chest Port 1 View  Result Date: 01/19/2022 CLINICAL DATA:  Shortness of breath. EXAM: PORTABLE CHEST 1 VIEW COMPARISON:  04/26/2020 FINDINGS: 0849 hours. The lungs are clear without focal pneumonia, edema, pneumothorax or pleural effusion. The cardiopericardial silhouette is within normal limits for size. Telemetry leads overlie the chest. IMPRESSION: No active disease. Electronically Signed   By: Misty Stanley M.D.   On: 01/19/2022 08:58    Orson Eva, DO  [facility]  If 7PM-7AM, please contact night-coverage [email or web address] Password [facility] 01/20/2022, 5:37 PM   LOS: 0 days

## 2022-01-20 NOTE — Assessment & Plan Note (Addendum)
Newly diagnosed this admission 01/20/22 echo--EF 45-50% Start coreg and losartan Cardiology consult Suspect maybe partly related to [facility]

## 2022-01-20 NOTE — Progress Notes (Signed)
*  PRELIMINARY RESULTS* Echocardiogram 2D Echocardiogram has been performed.  Samuel Germany 01/20/2022, 12:49 PM

## 2022-01-21 ENCOUNTER — Encounter (HOSPITAL_COMMUNITY): Payer: Self-pay | Admitting: Internal Medicine

## 2022-01-21 ENCOUNTER — Observation Stay (HOSPITAL_COMMUNITY): Payer: No Typology Code available for payment source

## 2022-01-21 DIAGNOSIS — K6289 Other specified diseases of anus and rectum: Secondary | ICD-10-CM

## 2022-01-21 DIAGNOSIS — R778 Other specified abnormalities of plasma proteins: Secondary | ICD-10-CM | POA: Diagnosis not present

## 2022-01-21 DIAGNOSIS — I700 Atherosclerosis of aorta: Secondary | ICD-10-CM

## 2022-01-21 DIAGNOSIS — I255 Ischemic cardiomyopathy: Secondary | ICD-10-CM

## 2022-01-21 DIAGNOSIS — F1010 Alcohol abuse, uncomplicated: Secondary | ICD-10-CM | POA: Diagnosis not present

## 2022-01-21 DIAGNOSIS — R627 Adult failure to thrive: Secondary | ICD-10-CM | POA: Diagnosis not present

## 2022-01-21 DIAGNOSIS — R7401 Elevation of levels of liver transaminase levels: Secondary | ICD-10-CM | POA: Diagnosis not present

## 2022-01-21 DIAGNOSIS — R1033 Periumbilical pain: Secondary | ICD-10-CM | POA: Diagnosis not present

## 2022-01-21 DIAGNOSIS — N179 Acute kidney failure, unspecified: Secondary | ICD-10-CM | POA: Diagnosis not present

## 2022-01-21 LAB — CBC
HCT: 33.1 % — ABNORMAL LOW (ref 39.0–52.0)
Hemoglobin: 11 g/dL — ABNORMAL LOW (ref 13.0–17.0)
MCH: 32.4 pg (ref 26.0–34.0)
MCHC: 33.2 g/dL (ref 30.0–36.0)
MCV: 97.6 fL (ref 80.0–100.0)
Platelets: 125 10*3/uL — ABNORMAL LOW (ref 150–400)
RBC: 3.39 MIL/uL — ABNORMAL LOW (ref 4.22–5.81)
RDW: 14.5 % (ref 11.5–15.5)
WBC: 4.9 10*3/uL (ref 4.0–10.5)
nRBC: 0 % (ref 0.0–0.2)

## 2022-01-21 LAB — BASIC METABOLIC PANEL
Anion gap: 6 (ref 5–15)
BUN: 5 mg/dL — ABNORMAL LOW (ref 8–23)
CO2: 25 mmol/L (ref 22–32)
Calcium: 7.8 mg/dL — ABNORMAL LOW (ref 8.9–10.3)
Chloride: 107 mmol/L (ref 98–111)
Creatinine, Ser: 0.86 mg/dL (ref 0.61–1.24)
GFR, Estimated: >60 mL/min (ref >60)
Glucose, Bld: 98 mg/dL (ref 70–99)
Potassium: 3.8 mmol/L (ref 3.5–5.1)
Sodium: 138 mmol/L (ref 135–145)

## 2022-01-21 LAB — URINE CULTURE

## 2022-01-21 LAB — LACTIC ACID, PLASMA: Lactic Acid, Venous: 1.2 mmol/L (ref 0.5–1.9)

## 2022-01-21 LAB — MAGNESIUM: Magnesium: 1.4 mg/dL — ABNORMAL LOW (ref 1.7–2.4)

## 2022-01-21 LAB — HCV RNA QUANT

## 2022-01-21 MED ORDER — ROSUVASTATIN CALCIUM 10 MG PO TABS
10.0000 mg | ORAL_TABLET | Freq: Every day | ORAL | Status: DC
  Administered 2022-01-21 – 2022-01-23 (×3): 10 mg via ORAL
  Filled 2022-01-21 (×3): qty 1

## 2022-01-21 MED ORDER — MAGNESIUM SULFATE 2 GM/50ML IV SOLN
2.0000 g | Freq: Once | INTRAVENOUS | Status: AC
Start: 2022-01-21 — End: 2022-01-22
  Administered 2022-01-21: 2 g via INTRAVENOUS
  Filled 2022-01-21: qty 50

## 2022-01-21 MED ORDER — ORAL CARE MOUTH RINSE: 15.0000 mL | OROMUCOSAL | Status: DC | PRN

## 2022-01-21 MED ORDER — MAGNESIUM OXIDE -MG SUPPLEMENT 400 (240 MG) MG PO TABS
400.0000 mg | ORAL_TABLET | Freq: Every day | ORAL | Status: DC
  Administered 2022-01-21 – 2022-01-22 (×2): 400 mg via ORAL
  Filled 2022-01-21 (×2): qty 1

## 2022-01-21 MED ORDER — IOHEXOL 350 MG/ML SOLN
100.0000 mL | Freq: Once | INTRAVENOUS | Status: AC | PRN
  Administered 2022-01-21: 100 mL via INTRAVENOUS

## 2022-01-21 MED ORDER — DAPAGLIFLOZIN PROPANEDIOL 10 MG PO TABS
10.0000 mg | ORAL_TABLET | Freq: Every day | ORAL | Status: DC
  Administered 2022-01-21 – 2022-01-23 (×3): 10 mg via ORAL
  Filled 2022-01-21 (×3): qty 1

## 2022-01-21 NOTE — Consult Note (Signed)
Cardiology Consultation:   Patient ID: [name]; [id number]; Jul 12, 1950   Admit date: 01/19/2022 Date of Consult: 01/21/2022  Primary Care Provider: Lindell Spar, MD Primary Cardiologist: New to [facility] Primary Electrophysiologist: None   Patient Profile:   Jonathan Benitez is a 71 y.o. male with a history of hypertension, regular alcohol and marijuana use, GERD, prostate cancer status postradiation treatment in 2011 and hepatitis C who is being seen today for the evaluation of newly documented cardiomyopathy at the request of Dr. Carles Collet.  History of Present Illness:   Jonathan Benitez is currently admitted to the hospital reporting anorexia with generalized weakness and fatigue, orthostatic lightheadedness, and shortness of breath over the last 4 weeks.  Symptoms have been worse in the last few weeks.  He reports loose stools, intermittent upper abdominal discomfort, no emesis.  No orthopnea or PND, no chest pain, no leg swelling.  As part of his work-up an echocardiogram was obtained revealing LVEF 45 to 50% with mid to apical lateral hypokinesis and periapical hypokinesis.  High-sensitivity troponin I levels are in the 60s to 70s arguing against ACS.  ECG shows sinus tachycardia with leftward axis and nonspecific ST-T wave abnormalities.  He presented with evidence of acute renal failure likely complicated by intravascular depletion.  Also electrolyte abnormalities and transaminitis.  He is in the process for work-up of potential intestinal ischemia.  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 Florian Buff     Inpatient Medications: Scheduled Meds:  carvedilol  3.125 mg Oral BID WC   heparin  5,000 Units Subcutaneous Q8H   hydrocortisone  25 mg Rectal BID   loratadine  10 mg Oral Daily   losartan  25 mg Oral Daily   mirtazapine  15 mg Oral QHS   pantoprazole  40 mg Oral Daily   potassium chloride SA  40 mEq Oral BID   thiamine  100 mg Oral Daily    PRN Meds: acetaminophen **OR** acetaminophen, fluticasone, ondansetron **OR** ondansetron (ZOFRAN) IV  Allergies:    Allergies  Allergen Reactions   Nsaids Other (See Comments)     Anemia, Gastrointestinal hemorrhage    Social History:   Social History   Tobacco Use   Smoking status: Never   Smokeless tobacco: Never  Substance Use Topics   Alcohol use: Yes    Comment: was drinking 3-4 beers a day     Family History:   The patient's family history includes Hypertension in his father and mother; Stroke in his father. There is no history of Colon cancer or Colon polyps.  ROS:  Please see the history of present illness.  All other ROS reviewed and negative.     Physical Exam/Data:   Vitals:  01/20/22 0014 01/20/22 0415 01/20/22 1527 01/20/22 2112  BP: 117/71 131/86 (!) 168/101 (!) 142/92  Pulse: 94 98 100 97  Resp: '18 18 19 19  '$ Temp: 98.6 F (37 C) 98.4 F (36.9 C) (!) 97.5 F (36.4 C) 97.9 F (36.6 C)  TempSrc: Oral Oral Oral Oral  SpO2: 100% 100% 100% 100%  Weight:      Height:        Intake/Output Summary (Last 24 hours) at 01/21/2022 1244 Last data filed at 01/20/2022 1500 Gross per 24 hour  Intake 1555.72 ml  Output --  Net 1555.72 ml   Filed Weights   01/19/22 0751  Weight: 79.4 kg   Body mass index is 23.73 kg/m.   Gen: Patient appears  comfortable at rest. HEENT: Conjunctiva and lids normal, oropharynx clear. Neck: Supple, no elevated JVP or carotid bruits, no thyromegaly. Lungs: Clear to auscultation, nonlabored breathing at rest. Cardiac: Regular rate and rhythm, no S3, 1/6 systolic murmur, no pericardial rub. Abdomen: Soft, nontender, bowel sounds present, no guarding or rebound. Extremities: No pitting edema, distal pulses 2+. Skin: Warm and dry. Musculoskeletal: No kyphosis. Neuropsychiatric: Alert and oriented x3, affect grossly appropriate.  EKG:  An ECG dated 01/19/2022 was personally reviewed today and demonstrated:  Sinus tachycardia with leftward axis and nonspecific ST-T changes.  Telemetry:  I personally reviewed telemetry which shows sinus tachycardia.  Laboratory Data:  Chemistry Recent Labs  Lab 01/19/22 0800 01/20/22 0505 01/21/22 0440  NA 138 139 138  K 2.7* 2.9* 3.8  CL 99 106 107  CO2 '24 25 25  '$ GLUCOSE 114* 84 98  BUN 12 8 5*  CREATININE 2.88* 0.99 0.86  CALCIUM 8.6* 7.6* 7.8*  GFRNONAA 23* >60 >60  ANIONGAP '15 8 6    '$ Recent Labs  Lab 01/19/22 0800 01/20/22 0505  PROT 9.4* 6.7  ALBUMIN 4.5 3.3*  AST 58* 37  ALT 42 28  ALKPHOS 65 46  BILITOT 1.4* 1.2   Hematology Recent Labs  Lab 01/19/22 0800 01/20/22 0505  WBC 7.0 4.9  RBC 4.33 3.39*  HGB 13.9 11.0*  HCT 41.0 33.1*  MCV 94.7 97.6  MCH 32.1 32.4  MCHC 33.9 33.2  RDW 14.5 14.5  PLT 179 125*   Cardiac Enzymes Recent Labs  Lab 01/19/22 0800 01/19/22 1102  TROPONINIHS 74* 33*    DDimer Recent Labs  Lab 01/19/22 0800  DDIMER 0.42    Radiology/Studies:  ECHOCARDIOGRAM COMPLETE  Result Date: 01/20/2022    ECHOCARDIOGRAM REPORT   Patient Name:   Jonathan Benitez Date of Exam: 01/20/2022 Medical Rec #:  [id number]        Height:       72.0 in Accession #:    [id number]       Weight:       175.0 lb Date of Birth:  03-Jul-1950        BSA:          2.013 m Patient Age:    49 years         BP:           163/91 mmHg  Patient Gender: M                HR:           85 bpm. Exam Location:  [facility] Procedure: 2D Echo, Cardiac Doppler and Color Doppler Indications:    Dyspnea R06.00  History:        Patient has no prior history  of Echocardiogram examinations.                 Risk Factors:Hypertension. Hx of Alcoholism, Marijuana abuse,                 Prostate cancer.  Sonographer:    Alvino Chapel RCS Referring Phys: [phone] DAVID TAT IMPRESSIONS  1. Left ventricular ejection fraction, by estimation, is 45 to 50%. The left ventricle has mildly decreased function. The left ventricle demonstrates regional wall motion abnormalities (see scoring diagram/findings for description). There is mild asymmetric left ventricular hypertrophy of the basal-septal segment. There is hypokinesis of the mid-to-apical lateral LV segments, all apical segments, and apex. Indeterminate diastolic filling due to E-A fusion.  2. Right ventricular systolic function is normal. The right ventricular size is normal.  3. Left atrial size was mild to moderately dilated.  4. The mitral valve is abnormal. Trivial mitral valve regurgitation. Moderate to severe mitral annular calcification.  5. The aortic valve is tricuspid. There is mild calcification of the aortic valve. There is mild thickening of the aortic valve. Aortic valve regurgitation is not visualized. Aortic valve sclerosis/calcification is present, without any evidence of aortic stenosis.  6. Aortic dilatation noted. There is borderline dilatation of the aortic root, measuring 37 mm.  7. The inferior vena cava is normal in size with greater than 50% respiratory variability, suggesting right atrial pressure of 3 mmHg. FINDINGS  Left Ventricle: Left ventricular ejection fraction, by estimation, is 45 to 50%. The left ventricle has mildly decreased function. The left ventricle demonstrates regional wall motion abnormalities. The left ventricular internal cavity size was normal in size. There is mild  asymmetric left ventricular hypertrophy of the basal-septal segment. There is hypokinesis of the mid-to-apical lateral LV segments, all apical segments and apex. Indeterminate diastolic filling due to E-A fusion. Right Ventricle: The right ventricular size is normal. No increase in right ventricular wall thickness. Right ventricular systolic function is normal. Left Atrium: Left atrial size was mild to moderately dilated. Right Atrium: Right atrial size was normal in size. Pericardium: There is no evidence of pericardial effusion. Mitral Valve: The mitral valve is abnormal. There is mild thickening of the mitral valve leaflet(s). There is mild calcification of the mitral valve leaflet(s). Moderate to severe mitral annular calcification. Trivial mitral valve regurgitation. Tricuspid Valve: The tricuspid valve is normal in structure. Tricuspid valve regurgitation is trivial. Aortic Valve: The aortic valve is tricuspid. There is mild calcification of the aortic valve. There is mild thickening of the aortic valve. Aortic valve regurgitation is not visualized. Aortic valve sclerosis/calcification is present, without any evidence of aortic stenosis. Pulmonic Valve: The pulmonic valve was normal in structure. Pulmonic valve regurgitation is trivial. Aorta: Aortic dilatation noted. There is borderline dilatation of the aortic root, measuring 37 mm. Venous: The inferior vena cava is normal in size with greater than 50% respiratory variability, suggesting right atrial pressure of 3 mmHg. IAS/Shunts: The atrial septum is grossly normal.  LEFT VENTRICLE PLAX 2D LVIDd:         4.40 cm   Diastology LVIDs:         2.40 cm   LV e' lateral:   9.36 cm/s LV PW:         1.00 cm   LV E/e' lateral: 12.2 LV IVS:        1.00 cm LVOT diam:     2.20 cm LV SV:         62 LV SV Index:  31 LVOT Area:     3.80 cm  RIGHT VENTRICLE RV S prime:     12.90 cm/s TAPSE (M-mode): 2.3 cm LEFT ATRIUM             Index        RIGHT ATRIUM           Index  LA diam:        3.20 cm 1.59 cm/m   RA Area:     15.30 cm LA Vol (A2C):   82.5 ml 40.98 ml/m  RA Volume:   39.90 ml  19.82 ml/m LA Vol (A4C):   59.4 ml 29.51 ml/m LA Biplane Vol: 75.3 ml 37.41 ml/m  AORTIC VALVE LVOT Vmax:   99.00 cm/s LVOT Vmean:  65.000 cm/s LVOT VTI:    0.162 m  AORTA Ao Root diam: 3.70 cm MITRAL VALVE MV Area (PHT): 5.38 cm     SHUNTS MV Decel Time: 141 msec     Systemic VTI:  0.16 m MV E velocity: 114.00 cm/s  Systemic Diam: 2.20 cm Gwyndolyn Kaufman MD Electronically signed by Gwyndolyn Kaufman MD Signature Date/Time: 01/20/2022/1:24:16 PM    Final    CT ABDOMEN PELVIS WO CONTRAST  Result Date: 01/19/2022 CLINICAL DATA:  Abdominal pain for 2 weeks. Anorexia. Weight loss. Personal history of prostate carcinoma. EXAM: CT ABDOMEN AND PELVIS WITHOUT CONTRAST TECHNIQUE: Multidetector CT imaging of the abdomen and pelvis was performed following the standard protocol without IV contrast. RADIATION DOSE REDUCTION: This exam was performed according to the departmental dose-optimization program which includes automated exposure control, adjustment of the mA and/or kV according to patient size and/or use of iterative reconstruction technique. COMPARISON:  01/03/2021 FINDINGS: Lower chest: No acute findings. Hepatobiliary: No mass visualized on this unenhanced exam. Gallbladder is unremarkable. No evidence of biliary ductal dilatation. Pancreas: No mass or inflammatory process visualized on this unenhanced exam. Spleen:  Within normal limits in size. Adrenals/Urinary tract: No evidence of urolithiasis or hydronephrosis. Stomach/Bowel: New small to moderate hiatal hernia is seen. Normal appendix visualized. Mild left colonic diverticulosis is again seen, without evidence of diverticulitis. Mild diffuse rectal wall thickening is noted, suspicious for mild proctitis. Vascular/Lymphatic: No pathologically enlarged lymph nodes identified. No evidence of abdominal aortic aneurysm. Aortic  atherosclerotic calcification incidentally noted. Reproductive:  No mass or other significant abnormality. Other:  None. Musculoskeletal: No suspicious bone lesions identified. Left hip prosthesis noted. IMPRESSION: Mild diffuse rectal wall thickening, suspicious for proctitis. Colonic diverticulosis, without radiographic evidence of diverticulitis. New small to moderate hiatal hernia. Electronically Signed   By: Marlaine Hind M.D.   On: 01/19/2022 20:07   DG Chest Port 1 View  Result Date: 01/19/2022 CLINICAL DATA:  Shortness of breath. EXAM: PORTABLE CHEST 1 VIEW COMPARISON:  04/26/2020 FINDINGS: 0849 hours. The lungs are clear without focal pneumonia, edema, pneumothorax or pleural effusion. The cardiopericardial silhouette is within normal limits for size. Telemetry leads overlie the chest. IMPRESSION: No active disease. Electronically Signed   By: Misty Stanley M.D.   On: 01/19/2022 08:58    Assessment and Plan:   1.  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  He presents with symptoms also suggesting relative volume depletion with anorexia, acute renal insufficiency, and concern for possible intestinal ischemia.  He is being seen by the GI service.  Further imaging is planned.  He does have  a history of regular alcohol use so a nonischemic component to his cardiomyopathy is also possible.  2.  Aortic atherosclerosis evident by CT imaging.  3.  Colonic diverticulosis without CT evidence of diverticulitis.  Rectal wall thickening suggestive of mild proctitis.  4.  Mild to moderate hiatal hernia.  5.  Regular alcohol intake.  6.  Acute renal insufficiency, creatinine 2.88 down to 0.86 with IV fluids.  Lactic acid also improved.  Plan gradual adjustments in GDMT as it relates to cardiomyopathy as further work-up is ongoing.  Hold off on  ARB and ARNI at this time.  Blood pressure up, but initially hypotensive with acute renal insufficiency.  Continue Coreg, add Farxiga 10 mg daily for now.  Ischemic work-up as etiology for cardiomyopathy is ultimately consideration, however most likely as an outpatient after follow-ups established.  Would start statin therapy in light of aortic atherosclerosis.  We will follow with you.  Signed, Rozann Lesches, MD  01/21/2022 12:44 PM

## 2022-01-21 NOTE — Progress Notes (Signed)
PROGRESS NOTE  Jonathan Benitez OIN:[id number] DOB: 17-Dec-1950 DOA: 01/19/2022 PCP: Lindell Spar, MD  Brief History:  71 year old male with a history of hypertension, prostate cancer status post radiotherapy, GERD, and gouty arthritis presenting with 2-week history of generalized weakness, dyspnea on exertion, and dizziness.  The patient states that he has had decreased oral intake for the past month.  His anorexia has worsened in the last 2 weeks.  He states that every time he eats something solid he has some loose stools.  He denies any hematochezia or melena, but he states that he has some upper abdominal pain right greater than left upper quadrant.  He denies any nausea, vomiting, fevers, chills.  He has some occasional dysuria.  In the last 4 days, his dyspnea exertion and generalized weakness have worsened.  As result, he presented for further evaluation and treatment. In particular, the patient denies any headache, visual disturbance, focal extremity weakness, chest pain, palpitations, coughing, hemoptysis.  He has not been started on any new medications.  He states that he smokes marijuana on a daily basis.  He denies any illicit drug use.  He states that he drinks a beer or 2 approximately 2 to 3 days/week. In the emergency department, the patient was afebrile and hemodynamically stable.  He was tachycardic in the 110s.  Blood pressure was soft in the 100s.  Oxygen saturation was 100% room air.  WBC 7.0, hemoglobin 13.9, platelets 179,000.  AST 58, ALT 42, alk phosphatase 65, total bilirubin 1.4.  BMP showed a sodium 138, potassium 2.7, bicarbonate 24, BUN 12, creatinine 2.88.  COVID PCR was negative.  Chest x-ray was negative.  EKG shows sinus tachycardia with T wave inversions V4-V5, 1 and 3.  Troponin was 74.  D-dimer 0.42.  Patient was given 2 L LR and 50 milli equivalents of KCl in the emergency department.    Assessment and Plan: * AKI (acute kidney injury) ([place]) Baseline  creatinine 0.8-1.0 Presented with serum creatinine 2.8 Secondary to volume depletion Continue IV fluids>>improved  Proctitis Question ischemic proctitis -Anusol HC -appreciate GI eval  Cardiomyopathy ([place]) Newly diagnosed this admission 01/20/22 echo--EF 45-50% Start coreg and [place] [facility] consult appreciated Suspect maybe partly related to Etoh -rosuvastatin started  Hypomagnesemia Replete Start daily mag ox  Transaminasemia Likely due to alcohol use and chronic hep C Trend CT abdomen pelvis--proctitis; diverticulosis Hep B surface antigen Hep C RNA  Abdominal pain Due to mesenteric ischemia CT abdomen and pelvis>>proctitis, diverticulosis --start anusol HC supp CTA abd/pelvis--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; Stable atherosclerosis involving proximal SMA and IMA without high-grade stenosis -appreciate GI eval  Elevated troponin [place]and ischemia No chest pain presently Personally reviewed EKG--sinus rhythm, nonspecific T wave changes Echo--EF 45-50%, +WMA, trivial MR/TR  Hypokalemia Replete  Marijuana abuse Cessation discussed Patient likely has a component of cannabis hyperemesis syndrome  Failure to thrive in adult BSJ--6.283 M62--947 Folic MLYY--50.3 Urinalysis--no significant pyuria  Alcoholism ([place]) Patient continues to drink beers 2 to 3 days/week Start thiamine  HTN (hypertension) Holding lisinopril in the setting of AKI and soft BP Coreg started          Family Communication:  no  Family at bedside  Consultants:  GI, cardiology  Code Status:  FULL   DVT Prophylaxis:  [place] Heparin  Procedures: As Listed in Progress Note Above  Antibiotics: None      Subjective:  Continues to  have postprandial abd pain.  Denies f/c, cp, sob, n/v/d Objective: Vitals:   01/20/22 0415 01/20/22 1527 01/20/22 2112 01/21/22 1345  BP: 131/86 (!) 168/101 (!) 142/92 137/86  Pulse: 98  100 97 92  Resp: $Remo'18 19 19 20  'QzOQs$ Temp: 98.4 F (36.9 C) (!) 97.5 F (36.4 C) 97.9 F (36.6 C) 97.9 F (36.6 C)  TempSrc: Oral Oral Oral Oral  SpO2: 100% 100% 100% 100%  Weight:      Height:       No intake or output data in the 24 hours ending 01/21/22 1654 Weight change:  Exam:  General:  Pt is alert, follows commands appropriately, not in acute distress HEENT: No icterus, No thrush, No neck mass, [place]/AT Cardiovascular: RRR, S1/S2, no rubs, no gallops Respiratory: diminished BS but CTA bilaterally, no wheezing, no crackles, no rhonchi Abdomen: Soft/+BS, non tender, non distended, no guarding Extremities: No edema, No lymphangitis, No petechiae, No rashes, no synovitis   Data Reviewed: I have personally reviewed following labs and imaging studies Basic Metabolic Panel: Recent Labs  Lab 01/19/22 0800 01/20/22 0505 01/21/22 0440  NA 138 139 138  K 2.7* 2.9* 3.8  CL 99 106 107  CO2 $Re'24 25 25  'ImJ$ GLUCOSE 114* 84 98  BUN 12 8 5*  CREATININE 2.88* 0.99 0.86  CALCIUM 8.6* 7.6* 7.8*  MG 1.0* 1.2* 1.4*   Liver Function Tests: Recent Labs  Lab 01/19/22 0800 01/20/22 0505  AST 58* 37  ALT 42 28  ALKPHOS 65 46  BILITOT 1.4* 1.2  PROT 9.4* 6.7  ALBUMIN 4.5 3.3*   Recent Labs  Lab 01/19/22 1607  LIPASE 48   No results for input(s): "AMMONIA" in the last 168 hours. Coagulation Profile: No results for input(s): "INR", "PROTIME" in the last 168 hours. CBC: Recent Labs  Lab 01/19/22 0800 01/20/22 0505  WBC 7.0 4.9  NEUTROABS 4.3  --   HGB 13.9 11.0*  HCT 41.0 33.1*  MCV 94.7 97.6  PLT 179 125*   Cardiac Enzymes: Recent Labs  Lab 01/19/22 1607  CKTOTAL 129   BNP: Invalid input(s): "POCBNP" CBG: No results for input(s): "GLUCAP" in the last 168 hours. HbA1C: No results for input(s): "HGBA1C" in the last 72 hours. Urine analysis:    Component Value Date/Time   COLORURINE AMBER (A) 01/19/2022 1207   APPEARANCEUR CLOUDY (A) 01/19/2022 1207   LABSPEC 1.028  01/19/2022 1207   PHURINE 5.0 01/19/2022 1207   GLUCOSEU NEGATIVE 01/19/2022 1207   HGBUR NEGATIVE 01/19/2022 1207   BILIRUBINUR SMALL (A) 01/19/2022 1207   KETONESUR 5 (A) 01/19/2022 1207   PROTEINUR 100 (A) 01/19/2022 1207   UROBILINOGEN 0.2 12/17/2013 0825   NITRITE NEGATIVE 01/19/2022 1207   LEUKOCYTESUR NEGATIVE 01/19/2022 1207   Sepsis Labs: $RemoveBefo'@LABRCNTIP'nPcFNzuXEQI$ (procalcitonin:4,lacticidven:4) ) Recent Results (from the past 240 hour(s))  Resp Panel by RT-PCR (Flu A&B, Covid) Anterior Nasal Swab     Status: None   Collection Time: 01/19/22  8:18 AM   Specimen: Anterior Nasal Swab  Result Value Ref Range Status   SARS Coronavirus 2 by RT PCR NEGATIVE NEGATIVE Final    Comment: (NOTE) SARS-CoV-2 target nucleic acids are NOT DETECTED.  The SARS-CoV-2 RNA is generally detectable in upper respiratory specimens during the acute phase of infection. The lowest concentration of SARS-CoV-2 viral copies this assay can detect is 138 copies/mL. A negative result does not preclude SARS-Cov-2 infection and should not be used as the sole basis for treatment or other patient management decisions. A negative  result may occur with  improper specimen collection/handling, submission of specimen other than nasopharyngeal swab, presence of viral mutation(s) within the areas targeted by this assay, and inadequate number of viral copies(<138 copies/mL). A negative result must be combined with clinical observations, patient history, and epidemiological information. The expected result is Negative.  Fact Sheet for Patients:  EntrepreneurPulse.com.au  Fact Sheet for Healthcare Providers:  IncredibleEmployment.be  This test is no t yet approved or cleared by the [place] FDA and  has been authorized for detection and/or diagnosis of SARS-CoV-2 by FDA under an Emergency Use Authorization (EUA). This EUA will remain  in effect (meaning this test can be used) for the  duration of the COVID-19 declaration under Section 564(b)(1) of the Act, 21 U.S.C.section 360bbb-3(b)(1), unless the authorization is terminated  or revoked sooner.       Influenza A by PCR NEGATIVE NEGATIVE Final   Influenza B by PCR NEGATIVE NEGATIVE Final    Comment: (NOTE) The Xpert Xpress SARS-CoV-2/FLU/RSV plus assay is intended as an aid in the diagnosis of influenza from Nasopharyngeal swab specimens and should not be used as a sole basis for treatment. Nasal washings and aspirates are unacceptable for Xpert Xpress SARS-CoV-2/FLU/RSV testing.  Fact Sheet for Patients: EntrepreneurPulse.com.au  Fact Sheet for Healthcare Providers: IncredibleEmployment.be  This test is not yet approved or cleared by the [place] FDA and has been authorized for detection and/or diagnosis of SARS-CoV-2 by FDA under an Emergency Use Authorization (EUA). This EUA will remain in effect (meaning this test can be used) for the duration of the COVID-19 declaration under Section 564(b)(1) of the Act, 21 U.S.C. section 360bbb-3(b)(1), unless the authorization is terminated or revoked.  Performed at [facility], [address], [place], [place] [address]   Urine Culture     Status: Abnormal   Collection Time: 01/19/22 12:07 PM   Specimen: Urine, Clean Catch  Result Value Ref Range Status   Specimen Description   Final    URINE, CLEAN CATCH Performed at [facility], [address] [place], [place] [address]    Special Requests   Final    NONE Performed at [facility], [address], [place], [place] [address]    Culture MULTIPLE SPECIES PRESENT, SUGGEST RECOLLECTION (A)  Final   Report Status 01/21/2022 FINAL  Final     Scheduled Meds:  carvedilol  3.125 mg Oral BID WC   dapagliflozin propanediol  10 mg Oral Daily   heparin  5,000 Units Subcutaneous Q8H   hydrocortisone  25 mg Rectal BID   loratadine  10 mg Oral Daily   mirtazapine  15 mg Oral  QHS   pantoprazole  40 mg Oral Daily   rosuvastatin  10 mg Oral Daily   thiamine  100 mg Oral Daily   Continuous Infusions:  magnesium sulfate bolus IVPB      Procedures/Studies: CT Angio Abd/Pel w/ and/or w/o  Result Date: 01/21/2022 CLINICAL DATA:  Decreased appetite, postprandial pain and weight loss. EXAM: CT ANGIOGRAPHY ABDOMEN AND PELVIS WITH CONTRAST AND WITHOUT CONTRAST TECHNIQUE: Multidetector CT imaging of the abdomen and pelvis was performed using the standard protocol during bolus administration of intravenous contrast. Multiplanar reconstructed images and MIPs were obtained and reviewed to evaluate the vascular anatomy. RADIATION DOSE REDUCTION: This exam was performed according to the departmental dose-optimization program which includes automated exposure control, adjustment of the mA and/or kV according to patient size and/or use of iterative reconstruction technique. CONTRAST:  145mL OMNIPAQUE IOHEXOL 350 MG/ML  SOLN COMPARISON:  Prior CTA of the abdomen and pelvis on 01/03/2021. CT of the abdomen and pelvis without contrast on 01/19/2022. FINDINGS: VASCULAR Aorta: Stable atherosclerosis without evidence of aneurysm or dissection. Celiac: Stable heavily calcified plaque at the origin of the celiac axis causing high-grade stenosis/subtotal occlusive disease. Predominant flow likely coming from collateral flow via the SMA given prominent pancreaticoduodenal vessels. SMA: Heavily calcified plaque again noted at the origin and proximal trunk of the SMA. This causes up to approximately 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 at the origin best seen on coronal imaging. Renals: Single renal arteries again noted bilaterally without significant stenosis. IMA: Calcified plaque again noted at origin which appears stable. IMA remains patent. Inflow: Stable atherosclerosis. No significant obstructive disease or aneurysmal disease. Proximal Outflow: Stable atherosclerosis of the common femoral arteries and femoral bifurcations  without significant obstructive disease. Veins: Venous phase imaging demonstrates normally patent venous structures in the abdomen and pelvis. Review of the MIP images confirms the above findings. NON-VASCULAR Lower chest: No acute abnormality. Hepatobiliary: No focal liver abnormality is seen. No gallstones, gallbladder wall thickening, or biliary dilatation. Pancreas: Unremarkable. No pancreatic ductal dilatation or surrounding inflammatory changes. Spleen: Normal in size without focal abnormality. Adrenals/Urinary Tract: Adrenal glands are unremarkable. Kidneys are normal, without renal calculi, focal lesion, or hydronephrosis. Bladder is unremarkable. Stomach/Bowel: Diaphragmatic hiatus and gastric morphology proximally is consistent with a component a sliding hiatal hernia which appears relatively produced at the time of current imaging. Bowel shows no evidence of obstruction, ileus, inflammation or lesion. The appendix demonstrates small calcified appendicolith at the tip with no evidence of acute appendicitis. Stable diverticulosis of the sigmoid and descending colon without evidence of acute diverticulitis. No free intraperitoneal air. Lymphatic: No enlarged abdominal or pelvic lymph nodes. Reproductive: Prostate is unremarkable. Other: No abdominal wall hernia or abnormality. No abdominopelvic ascites. Musculoskeletal: No acute or significant osseous findings. IMPRESSION: 1. 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 2. Stable atherosclerosis involving proximal SMA and IMA without high-grade stenosis. Maximal narrowing of the proximal SMA again approaches 50% caliber. 3. Calcified appendicolith at the tip of the appendix without evidence acute appendicitis. 4. Diverticulosis of the descending and sigmoid colon without evidence of diverticulitis. Electronically Signed   By: Aletta Edouard M.D.   On: 01/21/2022  15:48   ECHOCARDIOGRAM COMPLETE  Result Date: 01/20/2022    ECHOCARDIOGRAM REPORT   Patient Name:   VIYAAN CHAMPINE Date of Exam: 01/20/2022 Medical Rec #:  [id number]        Height:       72.0 in Accession #:    [id number]       Weight:       175.0 lb Date of Birth:  04/02/1951        BSA:          2.013 m Patient Age:    38 years         BP:           163/91 mmHg Patient Gender: M                HR:           85 bpm. Exam Location:  [facility] Procedure: 2D Echo, Cardiac Doppler and Color Doppler Indications:    Dyspnea R06.00  History:        Patient has no prior history of Echocardiogram examinations.  Risk Factors:Hypertension. Hx of Alcoholism, Marijuana abuse,                 Prostate cancer.  Sonographer:    Alvino Chapel RCS Referring Phys: [phone] Appollonia Klee IMPRESSIONS  1. Left ventricular ejection fraction, by estimation, is 45 to 50%. The left ventricle has mildly decreased function. The left ventricle demonstrates regional wall motion abnormalities (see scoring diagram/findings for description). There is mild asymmetric left ventricular hypertrophy of the basal-septal segment. There is hypokinesis of the mid-to-apical lateral LV segments, all apical segments, and apex. Indeterminate diastolic filling due to E-A fusion.  2. Right ventricular systolic function is normal. The right ventricular size is normal.  3. Left atrial size was mild to moderately dilated.  4. The mitral valve is abnormal. Trivial mitral valve regurgitation. Moderate to severe mitral annular calcification.  5. The aortic valve is tricuspid. There is mild calcification of the aortic valve. There is mild thickening of the aortic valve. Aortic valve regurgitation is not visualized. Aortic valve sclerosis/calcification is present, without any evidence of aortic stenosis.  6. Aortic dilatation noted. There is borderline dilatation of the aortic root, measuring 37 mm.  7. The inferior vena cava is normal in size with greater  than 50% respiratory variability, suggesting right atrial pressure of 3 mmHg. FINDINGS  Left Ventricle: Left ventricular ejection fraction, by estimation, is 45 to 50%. The left ventricle has mildly decreased function. The left ventricle demonstrates regional wall motion abnormalities. The left ventricular internal cavity size was normal in size. There is mild asymmetric left ventricular hypertrophy of the basal-septal segment. There is hypokinesis of the mid-to-apical lateral LV segments, all apical segments and apex. Indeterminate diastolic filling due to E-A fusion. Right Ventricle: The right ventricular size is normal. No increase in right ventricular wall thickness. Right ventricular systolic function is normal. Left Atrium: Left atrial size was mild to moderately dilated. Right Atrium: Right atrial size was normal in size. Pericardium: There is no evidence of pericardial effusion. Mitral Valve: The mitral valve is abnormal. There is mild thickening of the mitral valve leaflet(s). There is mild calcification of the mitral valve leaflet(s). Moderate to severe mitral annular calcification. Trivial mitral valve regurgitation. Tricuspid Valve: The tricuspid valve is normal in structure. Tricuspid valve regurgitation is trivial. Aortic Valve: The aortic valve is tricuspid. There is mild calcification of the aortic valve. There is mild thickening of the aortic valve. Aortic valve regurgitation is not visualized. Aortic valve sclerosis/calcification is present, without any evidence of aortic stenosis. Pulmonic Valve: The pulmonic valve was normal in structure. Pulmonic valve regurgitation is trivial. Aorta: Aortic dilatation noted. There is borderline dilatation of the aortic root, measuring 37 mm. Venous: The inferior vena cava is normal in size with greater than 50% respiratory variability, suggesting right atrial pressure of 3 mmHg. IAS/Shunts: The atrial septum is grossly normal.  LEFT VENTRICLE PLAX 2D LVIDd:          4.40 cm   Diastology LVIDs:         2.40 cm   LV e' lateral:   9.36 cm/s LV PW:         1.00 cm   LV E/e' lateral: 12.2 LV IVS:        1.00 cm LVOT diam:     2.20 cm LV SV:         62 LV SV Index:   31 LVOT Area:     3.80 cm  RIGHT VENTRICLE RV S prime:  12.90 cm/s TAPSE (M-mode): 2.3 cm LEFT ATRIUM             Index        RIGHT ATRIUM           Index LA diam:        3.20 cm 1.59 cm/m   RA Area:     15.30 cm LA Vol (A2C):   82.5 ml 40.98 ml/m  RA Volume:   39.90 ml  19.82 ml/m LA Vol (A4C):   59.4 ml 29.51 ml/m LA Biplane Vol: 75.3 ml 37.41 ml/m  AORTIC VALVE LVOT Vmax:   99.00 cm/s LVOT Vmean:  65.000 cm/s LVOT VTI:    0.162 m  AORTA Ao Root diam: 3.70 cm MITRAL VALVE MV Area (PHT): 5.38 cm     SHUNTS MV Decel Time: 141 msec     Systemic VTI:  0.16 m MV E velocity: 114.00 cm/s  Systemic Diam: 2.20 cm Gwyndolyn Kaufman MD Electronically signed by Gwyndolyn Kaufman MD Signature Date/Time: 01/20/2022/1:24:16 PM    Final    CT ABDOMEN PELVIS WO CONTRAST  Result Date: 01/19/2022 CLINICAL DATA:  Abdominal pain for 2 weeks. Anorexia. Weight loss. Personal history of prostate carcinoma. EXAM: CT ABDOMEN AND PELVIS WITHOUT CONTRAST TECHNIQUE: Multidetector CT imaging of the abdomen and pelvis was performed following the standard protocol without IV contrast. RADIATION DOSE REDUCTION: This exam was performed according to the departmental dose-optimization program which includes automated exposure control, adjustment of the mA and/or kV according to patient size and/or use of iterative reconstruction technique. COMPARISON:  01/03/2021 FINDINGS: Lower chest: No acute findings. Hepatobiliary: No mass visualized on this unenhanced exam. Gallbladder is unremarkable. No evidence of biliary ductal dilatation. Pancreas: No mass or inflammatory process visualized on this unenhanced exam. Spleen:  Within normal limits in size. Adrenals/Urinary tract: No evidence of urolithiasis or hydronephrosis. Stomach/Bowel: New  small to moderate hiatal hernia is seen. Normal appendix visualized. Mild left colonic diverticulosis is again seen, without evidence of diverticulitis. Mild diffuse rectal wall thickening is noted, suspicious for mild proctitis. Vascular/Lymphatic: No pathologically enlarged lymph nodes identified. No evidence of abdominal aortic aneurysm. Aortic atherosclerotic calcification incidentally noted. Reproductive:  No mass or other significant abnormality. Other:  None. Musculoskeletal: No suspicious bone lesions identified. Left hip prosthesis noted. IMPRESSION: Mild diffuse rectal wall thickening, suspicious for proctitis. Colonic diverticulosis, without radiographic evidence of diverticulitis. New small to moderate hiatal hernia. Electronically Signed   By: Marlaine Hind M.D.   On: 01/19/2022 20:07   DG Chest Port 1 View  Result Date: 01/19/2022 CLINICAL DATA:  Shortness of breath. EXAM: PORTABLE CHEST 1 VIEW COMPARISON:  04/26/2020 FINDINGS: 0849 hours. The lungs are clear without focal pneumonia, edema, pneumothorax or pleural effusion. The cardiopericardial silhouette is within normal limits for size. Telemetry leads overlie the chest. IMPRESSION: No active disease. Electronically Signed   By: Misty Stanley M.D.   On: 01/19/2022 08:58    Orson Eva, DO  [facility]  If 7PM-7AM, please contact night-coverage [email or web address] Password [email or web address] 01/21/2022, 4:54 PM   LOS: 0 days

## 2022-01-21 NOTE — H&P (View-Only) (Signed)
Gastroenterology Consult   Referring Provider: Dr. Carles Collet Primary Care Physician:  Lindell Spar, MD Primary Gastroenterologist:  previously Dr. Laural Golden   Patient ID: Fidela Salisbury; [id number]; 10-29-1950   Admit date: 01/19/2022  LOS: 0 days   Date of Consultation: 01/21/2022  Reason for Consultation:  Proctitis   History of Present Illness   Gaurav ARJAY JASKIEWICZ is a 71 y.o. year old male with history of chronic GERD, chronic Hep C s/p treatment through the [facility] years ago, history of IDA, HTN, prostate cancer in the past, presenting with approximately 4 weeks of generalized weakness, shortness of breath, and decreased appetite. He was found to have acute renal injury with Creatinine 2.8, newly diagnosed cardiomyopathy with ECHO 45-50%, mildly elevated AST at 58 and Tbili 1.4 now normalized, and CT abd/pelvis without contrast revealing mild diffuse rectal wall thickening, suspicious for proctitis. GI consulted due to proctitis.   Patient notes feeling short of breath, dizzy, no appetite off and on for about a month or more. At first thought was related to not eating as he should. Since being in hospital, he has had a better appetite. Had not been taking outpatient prescribed meds as he was supposed to, skipping days. No dysphagia. Notes lower abdominal pain that has improved. Pain had been persent for about a month. Below umbilicus. Postprandial. Lost weight: 10 lbs over the past month. No rectal bleeding. BM this morning. Was having looser stool/soft stools postprandially prior to admission for at least a year.   Eggs, bacon, oatmeal today. Postprandial discomfort mild after eating. 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 He was to see Vascular but did not make this appointment.   Last colonoscopy 2019: one 6 mm polyp in ascending coon,  sigmoid and transverse colon diverticulosis, hemorrhoids. (Tubular adenoma. 5 year surveillance recommended).      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 Florian Buff    Prior to Admission medications   Medication Sig Start Date End Date Taking? Authorizing Provider  cetirizine (ZYRTEC) 10 MG tablet Take 10 mg by mouth daily.   Yes [provider]  colchicine 0.6 MG tablet TAKE 2 TABLETS TODAY; THEN 1 TABLET 1 HOUR LATER. CONTINUE 1 TABLET DAILY UNTIL FLARE RESOLVES. 06/19/21  Yes Patel, Colin Broach, MD  fluticasone (FLONASE) 50 MCG/ACT nasal spray Place 2 sprays into both nostrils daily as needed for allergies.    Yes [provider]  lisinopril (ZESTRIL) 20 MG tablet Take 20 mg by mouth daily.   Yes [provider]  mirtazapine (REMERON) 15 MG tablet Take 1 tablet (15 mg total) by  mouth at bedtime. 09/07/20  Yes Lindell Spar, MD  omeprazole (PRILOSEC) 40 MG capsule Take 1 capsule (40 mg total) by mouth daily. 06/30/20  Yes Noreene Larsson, NP  allopurinol (ZYLOPRIM) 100 MG tablet Take 1 tablet (100 mg total) by mouth daily. Patient not taking: Reported on 09/13/2021 06/19/21   Lindell Spar, MD  cyclobenzaprine (FLEXERIL) 5 MG tablet Take 1 tablet (5 mg total) by mouth 3 (three) times daily as needed for muscle spasms. Patient not  taking: Reported on 09/13/2021 03/01/21   Lindell Spar, MD  diclofenac Sodium (VOLTAREN) 1 % GEL Apply 2 g topically 4 (four) times daily. Patient not taking: Reported on 01/21/2022 05/01/20   Corena Herter, PA-C  polyethylene glycol-electrolytes (TRILYTE) 420 g solution Take 4,000 mLs by mouth as directed. Patient not taking: Reported on 01/21/2022 08/22/21   Rogene Houston, MD  potassium chloride SA (KLOR-CON) 20 MEQ tablet Take 2 tablets (40 mEq total) by mouth daily. Patient not taking: Reported on 01/21/2022 04/27/20   Elnora Morrison, MD  predniSONE (DELTASONE) 10 MG tablet Take 2 tablets (20 mg total) by mouth daily. Patient not taking: Reported on 09/13/2021 05/30/21   Milton Ferguson, MD  TRAZODONE HCL PO Take 1-2 tablets by mouth daily as needed (sleep). Patient not taking: Reported on 01/21/2022    [provider]    Current Facility-Administered Medications  Medication Dose Route Frequency Provider Last Rate Last Admin   acetaminophen (TYLENOL) tablet 650 mg  650 mg Oral Q6H PRN Tat, David, MD       Or   acetaminophen (TYLENOL) suppository 650 mg  650 mg Rectal Q6H PRN Tat, Shanon Brow, MD       carvedilol (COREG) tablet 3.125 mg  3.125 mg Oral BID WC Tat, Shanon Brow, MD   3.125 mg at 01/21/22 0802   dapagliflozin propanediol (FARXIGA) tablet 10 mg  10 mg Oral Daily Satira Sark, MD   10 mg at 01/21/22 1333   fluticasone (FLONASE) 50 MCG/ACT nasal spray 2 spray  2 spray Each Nare Daily PRN Tat, Shanon Brow, MD       heparin injection 5,000 Units  5,000 Units Subcutaneous Franco Collet, MD   5,000 Units at 01/21/22 1333   hydrocortisone (ANUSOL-HC) suppository 25 mg  25 mg Rectal BID Tat, Shanon Brow, MD       loratadine (CLARITIN) tablet 10 mg  10 mg Oral Daily Tat, Shanon Brow, MD   10 mg at 01/21/22 0801   mirtazapine (REMERON) tablet 15 mg  15 mg Oral QHS Orson Eva, MD   15 mg at 01/20/22 2100   ondansetron (ZOFRAN) tablet 4 mg  4 mg Oral Q6H PRN Tat, Shanon Brow, MD       Or   ondansetron  (ZOFRAN) injection 4 mg  4 mg Intravenous Q6H PRN Tat, Shanon Brow, MD       pantoprazole (PROTONIX) EC tablet 40 mg  40 mg Oral Daily Tat, David, MD   40 mg at 01/21/22 0802   potassium chloride SA (KLOR-CON M) CR tablet 40 mEq  40 mEq Oral BID Orson Eva, MD   40 mEq at 01/21/22 0802   rosuvastatin (CRESTOR) tablet 10 mg  10 mg Oral Daily Satira Sark, MD   10 mg at 01/21/22 1333   thiamine (VITAMIN B1) tablet 100 mg  100 mg Oral Daily Tat, Shanon Brow, MD   100 mg at 01/21/22 0802    Allergies as of 01/19/2022 - Review Complete 01/19/2022  Allergen Reaction  Noted   Nsaids Other (See Comments) 07/01/2012    Family History  Problem Relation Age of Onset   Hypertension Mother    Hypertension Father    Stroke Father    Colon cancer Neg Hx    Colon polyps Neg Hx     Social History   Socioeconomic History   Marital status: Widowed    Spouse name: Not on file   Number of children: Not on file   Years of education: Not on file   Highest education level: Not on file  Occupational History   Not on file  Tobacco Use   Smoking status: Never   Smokeless tobacco: Never  Vaping Use   Vaping Use: Never used  Substance and Sexual Activity   Alcohol use: Yes    Comment: was drinking 3-4 beers a day   Drug use: Yes    Types: Marijuana    Comment: last used this morning   Sexual activity: Not on file  Other Topics Concern   Not on file  Social History Narrative   Not on file   Social Determinants of Health   Financial Resource Strain: Medium Risk (03/26/2021)   Overall Financial Resource Strain (CARDIA)    Difficulty of Paying Living Expenses: Somewhat hard  Food Insecurity: Food Insecurity Present (01/19/2022)   Hunger Vital Sign    Worried About Running Out of Food in the Last Year: Sometimes true    Ran Out of Food in the Last Year: Sometimes true  Transportation Needs: No Transportation Needs (01/19/2022)   PRAPARE - Hydrologist (Medical): No    Lack  of Transportation (Non-Medical): No  Physical Activity: Insufficiently Active (03/26/2021)   Exercise Vital Sign    Days of Exercise per Week: 7 days    Minutes of Exercise per Session: 20 min  Stress: No Stress Concern Present (03/26/2021)   [place]    Feeling of Stress : Not at all  Social Connections: Socially Isolated (03/26/2021)   Social Connection and Isolation Panel [NHANES]    Frequency of Communication with Friends and Family: More than three times a week    Frequency of Social Gatherings with Friends and Family: More than three times a week    Attends Religious Services: Never    Marine scientist or Organizations: No    Attends Archivist Meetings: Never    Marital Status: Divorced  Human resources officer Violence: Not At Risk (01/19/2022)   Humiliation, Afraid, Rape, and Kick questionnaire    Fear of Current or Ex-Partner: No    Emotionally Abused: No    Physically Abused: No    Sexually Abused: No     Review of Systems   Gen: see HPI CV: Denies chest pain, heart palpitations, syncope, edema  Resp: Denies shortness of breath with rest, cough, wheezing, coughing up blood, and pleurisy. GI: see HPI GU : Denies urinary burning, blood in urine, urinary frequency, and urinary incontinence. MS: Denies joint pain, limitation of movement, swelling, cramps, and atrophy.  Derm: Denies rash, itching, dry skin, hives. Psych: Denies depression, anxiety, memory loss, hallucinations, and confusion. Heme: Denies bruising or bleeding Neuro:  Denies any headaches, dizziness, paresthesias, shaking  Physical Exam   Vital Signs in last 24 hours: Temp:  [97.5 F (36.4 C)-97.9 F (36.6 C)] 97.9 F (36.6 C) (09/11 1345) Pulse Rate:  [92-100] 92 (09/11 1345) Resp:  [19-20] 20 (09/11 1345) BP: (  137-168)/(86-101) 137/86 (09/11 1345) SpO2:  [100 %] 100 % (09/11 1345) Last BM Date : 01/21/22  General:    Alert,  Well-developed, well-nourished, pleasant and cooperative in NAD Head:  Normocephalic and atraumatic. Eyes:  Sclera clear, no icterus.   Conjunctiva pink. Lungs:  Clear throughout to auscultation.    Heart:  S1 S2 present, soft systolic murmur Abdomen:  Soft, mild TTP RLQ and nondistended. No masses, hepatosplenomegaly or hernias noted. Normal bowel sounds, without guarding, and without rebound.   Rectal: deferred   Msk:  Symmetrical without gross deformities.  Extremities:  Without  edema. Neurologic:  Alert and  oriented x4. Skin:  Intact without significant lesions or rashes. Psych:  Alert and cooperative. Normal mood and affect.  Intake/Output from previous day: 09/10 0701 - 09/11 0700 In: 2275.7 [P.O.:720; I.V.:1455.7; IV Piggyback:100] Out: -  Intake/Output this shift: No intake/output data recorded.   Labs/Studies   Recent Labs Recent Labs    01/19/22 0800 01/20/22 0505  WBC 7.0 4.9  HGB 13.9 11.0*  HCT 41.0 33.1*  PLT 179 125*   BMET Recent Labs    01/19/22 0800 01/20/22 0505 01/21/22 0440  NA 138 139 138  K 2.7* 2.9* 3.8  CL 99 106 107  CO2 '24 25 25  '$ GLUCOSE 114* 84 98  BUN 12 8 5*  CREATININE 2.88* 0.99 0.86  CALCIUM 8.6* 7.6* 7.8*   LFT Recent Labs    01/19/22 0800 01/20/22 0505  PROT 9.4* 6.7  ALBUMIN 4.5 3.3*  AST 58* 37  ALT 42 28  ALKPHOS 65 46  BILITOT 1.4* 1.2     Radiology/Studies ECHOCARDIOGRAM COMPLETE  Result Date: 01/20/2022    ECHOCARDIOGRAM REPORT   Patient Name:   KHAIDEN SEGRETO Date of Exam: 01/20/2022 Medical Rec #:  [id number]        Height:       72.0 in Accession #:    [id number]       Weight:       175.0 lb Date of Birth:  [birth year]        BSA:          2.013 m Patient Age:    45 years         BP:           163/91 mmHg Patient Gender: M                HR:           85 bpm. Exam Location:  [facility] Procedure: 2D Echo, Cardiac Doppler and Color Doppler Indications:    Dyspnea R06.00  History:        Patient has no  prior history of Echocardiogram examinations.                 Risk Factors:Hypertension. Hx of Alcoholism, Marijuana abuse,                 Prostate cancer.  Sonographer:    Alvino Chapel RCS Referring Phys: [phone] DAVID TAT IMPRESSIONS  1. Left ventricular ejection fraction, by estimation, is 45 to 50%. The left ventricle has mildly decreased function. The left ventricle demonstrates regional wall motion abnormalities (see scoring diagram/findings for description). There is mild asymmetric left ventricular hypertrophy of the basal-septal segment. There is hypokinesis of the mid-to-apical lateral LV segments, all apical segments, and apex. Indeterminate diastolic filling due to E-A fusion.  2. Right ventricular systolic function is normal. The right ventricular size is normal.  3. Left atrial size was mild to moderately dilated.  4. The mitral valve is abnormal. Trivial mitral valve regurgitation. Moderate to severe mitral annular calcification.  5. The aortic valve is tricuspid. There is mild calcification of the aortic valve. There is mild thickening of the aortic valve. Aortic valve regurgitation is not visualized. Aortic valve sclerosis/calcification is present, without any evidence of aortic stenosis.  6. Aortic dilatation noted. There is borderline dilatation of the aortic root, measuring 37 mm.  7. The inferior vena cava is normal in size with greater than 50% respiratory variability, suggesting right atrial pressure of 3 mmHg. FINDINGS  Left Ventricle: Left ventricular ejection fraction, by estimation, is 45 to 50%. The left ventricle has mildly decreased function. The left ventricle demonstrates regional wall motion abnormalities. The left ventricular internal cavity size was normal in size. There is mild asymmetric left ventricular hypertrophy of the basal-septal segment. There is hypokinesis of the mid-to-apical lateral LV segments, all apical segments and apex. Indeterminate diastolic filling due to E-A  fusion. Right Ventricle: The right ventricular size is normal. No increase in right ventricular wall thickness. Right ventricular systolic function is normal. Left Atrium: Left atrial size was mild to moderately dilated. Right Atrium: Right atrial size was normal in size. Pericardium: There is no evidence of pericardial effusion. Mitral Valve: The mitral valve is abnormal. There is mild thickening of the mitral valve leaflet(s). There is mild calcification of the mitral valve leaflet(s). Moderate to severe mitral annular calcification. Trivial mitral valve regurgitation. Tricuspid Valve: The tricuspid valve is normal in structure. Tricuspid valve regurgitation is trivial. Aortic Valve: The aortic valve is tricuspid. There is mild calcification of the aortic valve. There is mild thickening of the aortic valve. Aortic valve regurgitation is not visualized. Aortic valve sclerosis/calcification is present, without any evidence of aortic stenosis. Pulmonic Valve: The pulmonic valve was normal in structure. Pulmonic valve regurgitation is trivial. Aorta: Aortic dilatation noted. There is borderline dilatation of the aortic root, measuring 37 mm. Venous: The inferior vena cava is normal in size with greater than 50% respiratory variability, suggesting right atrial pressure of 3 mmHg. IAS/Shunts: The atrial septum is grossly normal.  LEFT VENTRICLE PLAX 2D LVIDd:         4.40 cm   Diastology LVIDs:         2.40 cm   LV e' lateral:   9.36 cm/s LV PW:         1.00 cm   LV E/e' lateral: 12.2 LV IVS:        1.00 cm LVOT diam:     2.20 cm LV SV:         62 LV SV Index:   31 LVOT Area:     3.80 cm  RIGHT VENTRICLE RV S prime:     12.90 cm/s TAPSE (M-mode): 2.3 cm LEFT ATRIUM             Index        RIGHT ATRIUM           Index LA diam:        3.20 cm 1.59 cm/m   RA Area:     15.30 cm LA Vol (A2C):   82.5 ml 40.98 ml/m  RA Volume:   39.90 ml  19.82 ml/m LA Vol (A4C):   59.4 ml 29.51 ml/m LA Biplane Vol: 75.3 ml 37.41 ml/m   AORTIC VALVE LVOT Vmax:   99.00 cm/s LVOT Vmean:  65.000 cm/s LVOT VTI:  0.162 m  AORTA Ao Root diam: 3.70 cm MITRAL VALVE MV Area (PHT): 5.38 cm     SHUNTS MV Decel Time: 141 msec     Systemic VTI:  0.16 m MV E velocity: 114.00 cm/s  Systemic Diam: 2.20 cm Gwyndolyn Kaufman MD Electronically signed by Gwyndolyn Kaufman MD Signature Date/Time: 01/20/2022/1:24:16 PM    Final    CT ABDOMEN PELVIS WO CONTRAST  Result Date: 01/19/2022 CLINICAL DATA:  Abdominal pain for 2 weeks. Anorexia. Weight loss. Personal history of prostate carcinoma. EXAM: CT ABDOMEN AND PELVIS WITHOUT CONTRAST TECHNIQUE: Multidetector CT imaging of the abdomen and pelvis was performed following the standard protocol without IV contrast. RADIATION DOSE REDUCTION: This exam was performed according to the departmental dose-optimization program which includes automated exposure control, adjustment of the mA and/or kV according to patient size and/or use of iterative reconstruction technique. COMPARISON:  01/03/2021 FINDINGS: Lower chest: No acute findings. Hepatobiliary: No mass visualized on this unenhanced exam. Gallbladder is unremarkable. No evidence of biliary ductal dilatation. Pancreas: No mass or inflammatory process visualized on this unenhanced exam. Spleen:  Within normal limits in size. Adrenals/Urinary tract: No evidence of urolithiasis or hydronephrosis. Stomach/Bowel: New small to moderate hiatal hernia is seen. Normal appendix visualized. Mild left colonic diverticulosis is again seen, without evidence of diverticulitis. Mild diffuse rectal wall thickening is noted, suspicious for mild proctitis. Vascular/Lymphatic: No pathologically enlarged lymph nodes identified. No evidence of abdominal aortic aneurysm. Aortic atherosclerotic calcification incidentally noted. Reproductive:  No mass or other significant abnormality. Other:  None. Musculoskeletal: No suspicious bone lesions identified. Left hip prosthesis noted. IMPRESSION:  Mild diffuse rectal wall thickening, suspicious for proctitis. Colonic diverticulosis, without radiographic evidence of diverticulitis. New small to moderate hiatal hernia. Electronically Signed   By: Marlaine Hind M.D.   On: 01/19/2022 20:07     Assessment   Lennard Lenna Sciara Isenberg is a 71 y.o. year old male with history of chronic GERD, [facility], history of IDA, HTN, prostate cancer in the past, presenting with approximately 4 weeks of generalized weakness, shortness of breath, and decreased appetite. He was found to have acute renal injury with Creatinine 2.8, newly diagnosed cardiomyopathy with ECHO 45-50%, mildly elevated AST at 58 and Tbili 1.4 now normalized, and CT abd/pelvis without contrast revealing mild diffuse rectal wall thickening, suspicious for proctitis. GI consulted due to proctitis.   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 With his food aversion, decreased appetite, and postprandial abdominal pain/weight loss, recommend CTA today. Renal function has normalized with IV resuscitation.   Proctitis: unclear etiology. No diarrhea currently or rectal bleeding. Ultimately needs colonoscopy but will await CTA first. History of adenoma in 2019.   Mild elevated AST: reports drinking several beers a day. This has normalized as of yesterday. History of chronic Hep C s/p treatment at the [place]. HCV RNA is in process to document SVR.    Plan / Recommendations    CTA today. Colonoscopy to be determined after review of CTA In anticipation for colonoscopy, will keep on clear liquids Continue PPI daily Further recommendations to follow      01/21/2022, 2:42 PM  Annitta Needs, PhD, ANP-BC [facility]

## 2022-01-21 NOTE — Consult Note (Signed)
Gastroenterology Consult   Referring Provider: Dr. Carles Collet Primary Care Physician:  Lindell Spar, MD Primary Gastroenterologist:  previously Dr. Laural Golden   Patient ID: Jonathan Benitez; [id number]; January 02, 1951   Admit date: 01/19/2022  LOS: 0 days   Date of Consultation: 01/21/2022  Reason for Consultation:  Proctitis   History of Present Illness   Jonathan Benitez is a 71 y.o. year old male with history of chronic GERD, chronic Hep C s/p treatment through the [place] years ago, history of IDA, HTN, prostate cancer in the past, presenting with approximately 4 weeks of generalized weakness, shortness of breath, and decreased appetite. He was found to have acute renal injury with Creatinine 2.8, newly diagnosed cardiomyopathy with ECHO 45-50%, mildly elevated AST at 58 and Tbili 1.4 now normalized, and CT abd/pelvis without contrast revealing mild diffuse rectal wall thickening, suspicious for proctitis. GI consulted due to proctitis.   Patient notes feeling short of breath, dizzy, no appetite off and on for about a month or more. At first thought was related to not eating as he should. Since being in hospital, he has had a better appetite. Had not been taking outpatient prescribed meds as he was supposed to, skipping days. No dysphagia. Notes lower abdominal pain that has improved. Pain had been persent for about a month. Below umbilicus. Postprandial. Lost weight: 10 lbs over the past month. No rectal bleeding. BM this morning. Was having looser stool/soft stools postprandially prior to admission for at least a year.   Eggs, bacon, oatmeal today. Postprandial discomfort mild after eating. 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 He was to see Vascular but did not make this appointment.   Last colonoscopy 2019: one 6 mm polyp in ascending coon,  sigmoid and transverse colon diverticulosis, hemorrhoids. (Tubular adenoma. 5 year surveillance recommended).      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 Florian Buff    Prior to Admission medications   Medication Sig Start Date End Date Taking? Authorizing Provider  cetirizine (ZYRTEC) 10 MG tablet Take 10 mg by mouth daily.   Yes [provider]  colchicine 0.6 MG tablet TAKE 2 TABLETS TODAY; THEN 1 TABLET 1 HOUR LATER. CONTINUE 1 TABLET DAILY UNTIL FLARE RESOLVES. 06/19/21  Yes Patel, Colin Broach, MD  fluticasone (FLONASE) 50 MCG/ACT nasal spray Place 2 sprays into both nostrils daily as needed for allergies.    Yes [provider]  lisinopril (ZESTRIL) 20 MG tablet Take 20 mg by mouth daily.   Yes [provider]  mirtazapine (REMERON) 15 MG tablet Take 1 tablet (15 mg total) by  mouth at bedtime. 09/07/20  Yes Lindell Spar, MD  omeprazole (PRILOSEC) 40 MG capsule Take 1 capsule (40 mg total) by mouth daily. 06/30/20  Yes Noreene Larsson, NP  allopurinol (ZYLOPRIM) 100 MG tablet Take 1 tablet (100 mg total) by mouth daily. Patient not taking: Reported on 09/13/2021 06/19/21   Lindell Spar, MD  cyclobenzaprine (FLEXERIL) 5 MG tablet Take 1 tablet (5 mg total) by mouth 3 (three) times daily as needed for muscle spasms. Patient not  taking: Reported on 09/13/2021 03/01/21   Lindell Spar, MD  diclofenac Sodium (VOLTAREN) 1 % GEL Apply 2 g topically 4 (four) times daily. Patient not taking: Reported on 01/21/2022 05/01/20   Corena Herter, PA-C  polyethylene glycol-electrolytes (TRILYTE) 420 g solution Take 4,000 mLs by mouth as directed. Patient not taking: Reported on 01/21/2022 08/22/21   Rogene Houston, MD  potassium chloride SA (KLOR-CON) 20 MEQ tablet Take 2 tablets (40 mEq total) by mouth daily. Patient not taking: Reported on 01/21/2022 04/27/20   Elnora Morrison, MD  predniSONE (DELTASONE) 10 MG tablet Take 2 tablets (20 mg total) by mouth daily. Patient not taking: Reported on 09/13/2021 05/30/21   Milton Ferguson, MD  TRAZODONE HCL PO Take 1-2 tablets by mouth daily as needed (sleep). Patient not taking: Reported on 01/21/2022    [provider]    Current Facility-Administered Medications  Medication Dose Route Frequency Provider Last Rate Last Admin   acetaminophen (TYLENOL) tablet 650 mg  650 mg Oral Q6H PRN Tat, David, MD       Or   acetaminophen (TYLENOL) suppository 650 mg  650 mg Rectal Q6H PRN Tat, Shanon Brow, MD       carvedilol (COREG) tablet 3.125 mg  3.125 mg Oral BID WC Tat, Shanon Brow, MD   3.125 mg at 01/21/22 0802   dapagliflozin propanediol (FARXIGA) tablet 10 mg  10 mg Oral Daily Satira Sark, MD   10 mg at 01/21/22 1333   fluticasone (FLONASE) 50 MCG/ACT nasal spray 2 spray  2 spray Each Nare Daily PRN Tat, Shanon Brow, MD       heparin injection 5,000 Units  5,000 Units Subcutaneous Franco Collet, MD   5,000 Units at 01/21/22 1333   hydrocortisone (ANUSOL-HC) suppository 25 mg  25 mg Rectal BID Tat, Shanon Brow, MD       loratadine (CLARITIN) tablet 10 mg  10 mg Oral Daily Tat, Shanon Brow, MD   10 mg at 01/21/22 0801   mirtazapine (REMERON) tablet 15 mg  15 mg Oral QHS Orson Eva, MD   15 mg at 01/20/22 2100   ondansetron (ZOFRAN) tablet 4 mg  4 mg Oral Q6H PRN Tat, Shanon Brow, MD       Or   ondansetron  (ZOFRAN) injection 4 mg  4 mg Intravenous Q6H PRN Tat, Shanon Brow, MD       pantoprazole (PROTONIX) EC tablet 40 mg  40 mg Oral Daily Tat, David, MD   40 mg at 01/21/22 0802   potassium chloride SA (KLOR-CON M) CR tablet 40 mEq  40 mEq Oral BID Orson Eva, MD   40 mEq at 01/21/22 0802   rosuvastatin (CRESTOR) tablet 10 mg  10 mg Oral Daily Satira Sark, MD   10 mg at 01/21/22 1333   thiamine (VITAMIN B1) tablet 100 mg  100 mg Oral Daily Tat, Shanon Brow, MD   100 mg at 01/21/22 0802    Allergies as of 01/19/2022 - Review Complete 01/19/2022  Allergen Reaction  Noted   Nsaids Other (See Comments) 07/01/2012    Family History  Problem Relation Age of Onset   Hypertension Mother    Hypertension Father    Stroke Father    Colon cancer Neg Hx    Colon polyps Neg Hx     Social History   Socioeconomic History   Marital status: Widowed    Spouse name: Not on file   Number of children: Not on file   Years of education: Not on file   Highest education level: Not on file  Occupational History   Not on file  Tobacco Use   Smoking status: Never   Smokeless tobacco: Never  Vaping Use   Vaping Use: Never used  Substance and Sexual Activity   Alcohol use: Yes    Comment: was drinking 3-4 beers a day   Drug use: Yes    Types: Marijuana    Comment: last used this morning   Sexual activity: Not on file  Other Topics Concern   Not on file  Social History Narrative   Not on file   Social Determinants of Health   Financial Resource Strain: Medium Risk (03/26/2021)   Overall Financial Resource Strain (CARDIA)    Difficulty of Paying Living Expenses: Somewhat hard  Food Insecurity: Food Insecurity Present (01/19/2022)   Hunger Vital Sign    Worried About Running Out of Food in the Last Year: Sometimes true    Ran Out of Food in the Last Year: Sometimes true  Transportation Needs: No Transportation Needs (01/19/2022)   PRAPARE - Hydrologist (Medical): No    Lack  of Transportation (Non-Medical): No  Physical Activity: Insufficiently Active (03/26/2021)   Exercise Vital Sign    Days of Exercise per Week: 7 days    Minutes of Exercise per Session: 20 min  Stress: No Stress Concern Present (03/26/2021)   [place]    Feeling of Stress : Not at all  Social Connections: Socially Isolated (03/26/2021)   Social Connection and Isolation Panel [NHANES]    Frequency of Communication with Friends and Family: More than three times a week    Frequency of Social Gatherings with Friends and Family: More than three times a week    Attends Religious Services: Never    Marine scientist or Organizations: No    Attends Archivist Meetings: Never    Marital Status: Divorced  Human resources officer Violence: Not At Risk (01/19/2022)   Humiliation, Afraid, Rape, and Kick questionnaire    Fear of Current or Ex-Partner: No    Emotionally Abused: No    Physically Abused: No    Sexually Abused: No     Review of Systems   Gen: see HPI CV: Denies chest pain, heart palpitations, syncope, edema  Resp: Denies shortness of breath with rest, cough, wheezing, coughing up blood, and pleurisy. GI: see HPI GU : Denies urinary burning, blood in urine, urinary frequency, and urinary incontinence. MS: Denies joint pain, limitation of movement, swelling, cramps, and atrophy.  Derm: Denies rash, itching, dry skin, hives. Psych: Denies depression, anxiety, memory loss, hallucinations, and confusion. Heme: Denies bruising or bleeding Neuro:  Denies any headaches, dizziness, paresthesias, shaking  Physical Exam   Vital Signs in last 24 hours: Temp:  [97.5 F (36.4 C)-97.9 F (36.6 C)] 97.9 F (36.6 C) (09/11 1345) Pulse Rate:  [92-100] 92 (09/11 1345) Resp:  [19-20] 20 (09/11 1345) BP: (  137-168)/(86-101) 137/86 (09/11 1345) SpO2:  [100 %] 100 % (09/11 1345) Last BM Date : 01/21/22  General:    Alert,  Well-developed, well-nourished, pleasant and cooperative in NAD Head:  Normocephalic and atraumatic. Eyes:  Sclera clear, no icterus.   Conjunctiva pink. Lungs:  Clear throughout to auscultation.    Heart:  S1 S2 present, soft systolic murmur Abdomen:  Soft, mild TTP RLQ and nondistended. No masses, hepatosplenomegaly or hernias noted. Normal bowel sounds, without guarding, and without rebound.   Rectal: deferred   Msk:  Symmetrical without gross deformities.  Extremities:  Without  edema. Neurologic:  Alert and  oriented x4. Skin:  Intact without significant lesions or rashes. Psych:  Alert and cooperative. Normal mood and affect.  Intake/Output from previous day: 09/10 0701 - 09/11 0700 In: 2275.7 [P.O.:720; I.V.:1455.7; IV Piggyback:100] Out: -  Intake/Output this shift: No intake/output data recorded.   Labs/Studies   Recent Labs Recent Labs    01/19/22 0800 01/20/22 0505  WBC 7.0 4.9  HGB 13.9 11.0*  HCT 41.0 33.1*  PLT 179 125*   BMET Recent Labs    01/19/22 0800 01/20/22 0505 01/21/22 0440  NA 138 139 138  K 2.7* 2.9* 3.8  CL 99 106 107  CO2 '24 25 25  '$ GLUCOSE 114* 84 98  BUN 12 8 5*  CREATININE 2.88* 0.99 0.86  CALCIUM 8.6* 7.6* 7.8*   LFT Recent Labs    01/19/22 0800 01/20/22 0505  PROT 9.4* 6.7  ALBUMIN 4.5 3.3*  AST 58* 37  ALT 42 28  ALKPHOS 65 46  BILITOT 1.4* 1.2     Radiology/Studies ECHOCARDIOGRAM COMPLETE  Result Date: 01/20/2022    ECHOCARDIOGRAM REPORT   Patient Name:   Jonathan Benitez Date of Exam: 01/20/2022 Medical Rec #:  [id number]        Height:       72.0 in Accession #:    [id number]       Weight:       175.0 lb Date of Birth:  [birth year]        BSA:          2.013 m Patient Age:    69 years         BP:           163/91 mmHg Patient Gender: M                HR:           85 bpm. Exam Location:  [facility] Procedure: 2D Echo, Cardiac Doppler and Color Doppler Indications:    Dyspnea R06.00  History:        Patient has no  prior history of Echocardiogram examinations.                 Risk Factors:Hypertension. Hx of Alcoholism, Marijuana abuse,                 Prostate cancer.  Sonographer:    Alvino Chapel RCS Referring Phys: [phone] DAVID TAT IMPRESSIONS  1. Left ventricular ejection fraction, by estimation, is 45 to 50%. The left ventricle has mildly decreased function. The left ventricle demonstrates regional wall motion abnormalities (see scoring diagram/findings for description). There is mild asymmetric left ventricular hypertrophy of the basal-septal segment. There is hypokinesis of the mid-to-apical lateral LV segments, all apical segments, and apex. Indeterminate diastolic filling due to E-A fusion.  2. Right ventricular systolic function is normal. The right ventricular size is normal.  3. Left atrial size was mild to moderately dilated.  4. The mitral valve is abnormal. Trivial mitral valve regurgitation. Moderate to severe mitral annular calcification.  5. The aortic valve is tricuspid. There is mild calcification of the aortic valve. There is mild thickening of the aortic valve. Aortic valve regurgitation is not visualized. Aortic valve sclerosis/calcification is present, without any evidence of aortic stenosis.  6. Aortic dilatation noted. There is borderline dilatation of the aortic root, measuring 37 mm.  7. The inferior vena cava is normal in size with greater than 50% respiratory variability, suggesting right atrial pressure of 3 mmHg. FINDINGS  Left Ventricle: Left ventricular ejection fraction, by estimation, is 45 to 50%. The left ventricle has mildly decreased function. The left ventricle demonstrates regional wall motion abnormalities. The left ventricular internal cavity size was normal in size. There is mild asymmetric left ventricular hypertrophy of the basal-septal segment. There is hypokinesis of the mid-to-apical lateral LV segments, all apical segments and apex. Indeterminate diastolic filling due to E-A  fusion. Right Ventricle: The right ventricular size is normal. No increase in right ventricular wall thickness. Right ventricular systolic function is normal. Left Atrium: Left atrial size was mild to moderately dilated. Right Atrium: Right atrial size was normal in size. Pericardium: There is no evidence of pericardial effusion. Mitral Valve: The mitral valve is abnormal. There is mild thickening of the mitral valve leaflet(s). There is mild calcification of the mitral valve leaflet(s). Moderate to severe mitral annular calcification. Trivial mitral valve regurgitation. Tricuspid Valve: The tricuspid valve is normal in structure. Tricuspid valve regurgitation is trivial. Aortic Valve: The aortic valve is tricuspid. There is mild calcification of the aortic valve. There is mild thickening of the aortic valve. Aortic valve regurgitation is not visualized. Aortic valve sclerosis/calcification is present, without any evidence of aortic stenosis. Pulmonic Valve: The pulmonic valve was normal in structure. Pulmonic valve regurgitation is trivial. Aorta: Aortic dilatation noted. There is borderline dilatation of the aortic root, measuring 37 mm. Venous: The inferior vena cava is normal in size with greater than 50% respiratory variability, suggesting right atrial pressure of 3 mmHg. IAS/Shunts: The atrial septum is grossly normal.  LEFT VENTRICLE PLAX 2D LVIDd:         4.40 cm   Diastology LVIDs:         2.40 cm   LV e' lateral:   9.36 cm/s LV PW:         1.00 cm   LV E/e' lateral: 12.2 LV IVS:        1.00 cm LVOT diam:     2.20 cm LV SV:         62 LV SV Index:   31 LVOT Area:     3.80 cm  RIGHT VENTRICLE RV S prime:     12.90 cm/s TAPSE (M-mode): 2.3 cm LEFT ATRIUM             Index        RIGHT ATRIUM           Index LA diam:        3.20 cm 1.59 cm/m   RA Area:     15.30 cm LA Vol (A2C):   82.5 ml 40.98 ml/m  RA Volume:   39.90 ml  19.82 ml/m LA Vol (A4C):   59.4 ml 29.51 ml/m LA Biplane Vol: 75.3 ml 37.41 ml/m   AORTIC VALVE LVOT Vmax:   99.00 cm/s LVOT Vmean:  65.000 cm/s LVOT VTI:  0.162 m  AORTA Ao Root diam: 3.70 cm MITRAL VALVE MV Area (PHT): 5.38 cm     SHUNTS MV Decel Time: 141 msec     Systemic VTI:  0.16 m MV E velocity: 114.00 cm/s  Systemic Diam: 2.20 cm Gwyndolyn Kaufman MD Electronically signed by Gwyndolyn Kaufman MD Signature Date/Time: 01/20/2022/1:24:16 PM    Final    CT ABDOMEN PELVIS WO CONTRAST  Result Date: 01/19/2022 CLINICAL DATA:  Abdominal pain for 2 weeks. Anorexia. Weight loss. Personal history of prostate carcinoma. EXAM: CT ABDOMEN AND PELVIS WITHOUT CONTRAST TECHNIQUE: Multidetector CT imaging of the abdomen and pelvis was performed following the standard protocol without IV contrast. RADIATION DOSE REDUCTION: This exam was performed according to the departmental dose-optimization program which includes automated exposure control, adjustment of the mA and/or kV according to patient size and/or use of iterative reconstruction technique. COMPARISON:  01/03/2021 FINDINGS: Lower chest: No acute findings. Hepatobiliary: No mass visualized on this unenhanced exam. Gallbladder is unremarkable. No evidence of biliary ductal dilatation. Pancreas: No mass or inflammatory process visualized on this unenhanced exam. Spleen:  Within normal limits in size. Adrenals/Urinary tract: No evidence of urolithiasis or hydronephrosis. Stomach/Bowel: New small to moderate hiatal hernia is seen. Normal appendix visualized. Mild left colonic diverticulosis is again seen, without evidence of diverticulitis. Mild diffuse rectal wall thickening is noted, suspicious for mild proctitis. Vascular/Lymphatic: No pathologically enlarged lymph nodes identified. No evidence of abdominal aortic aneurysm. Aortic atherosclerotic calcification incidentally noted. Reproductive:  No mass or other significant abnormality. Other:  None. Musculoskeletal: No suspicious bone lesions identified. Left hip prosthesis noted. IMPRESSION:  Mild diffuse rectal wall thickening, suspicious for proctitis. Colonic diverticulosis, without radiographic evidence of diverticulitis. New small to moderate hiatal hernia. Electronically Signed   By: Marlaine Hind M.D.   On: 01/19/2022 20:07     Assessment   Santiago Lenna Sciara Temme is a 71 y.o. year old male with history of chronic GERD, [facility], history of IDA, HTN, prostate cancer in the past, presenting with approximately 4 weeks of generalized weakness, shortness of breath, and decreased appetite. He was found to have acute renal injury with Creatinine 2.8, newly diagnosed cardiomyopathy with ECHO 45-50%, mildly elevated AST at 58 and Tbili 1.4 now normalized, and CT abd/pelvis without contrast revealing mild diffuse rectal wall thickening, suspicious for proctitis. GI consulted due to proctitis.   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 With his food aversion, decreased appetite, and postprandial abdominal pain/weight loss, recommend CTA today. Renal function has normalized with IV resuscitation.   Proctitis: unclear etiology. No diarrhea currently or rectal bleeding. Ultimately needs colonoscopy but will await CTA first. History of adenoma in 2019.   Mild elevated AST: reports drinking several beers a day. This has normalized as of yesterday. History of chronic Hep C s/p treatment at the [place]. HCV RNA is in process to document SVR.    Plan / Recommendations    CTA today. Colonoscopy to be determined after review of CTA In anticipation for colonoscopy, will keep on clear liquids Continue PPI daily Further recommendations to follow      01/21/2022, 2:42 PM  Annitta Needs, PhD, ANP-BC [facility] Gastroenterology

## 2022-01-21 NOTE — Progress Notes (Addendum)
  Transition of Care (TOC) Screening Note   Patient Details  Name: Jonathan Benitez Date of Birth: 1951-04-05   Transition of Care [facility]) CM/SW Contact:    Ihor Gully, LCSW Phone Number: 01/21/2022, 9:51 AM    Transition of Care Department [facility]) has reviewed patient and no TOC needs have been identified at this time. We will continue to monitor patient advancement through interdisciplinary progression rounds. If new patient transition needs arise, please place a TOC consult.    [place] notification complete: Your notification ID is: [phone]

## 2022-01-21 NOTE — Assessment & Plan Note (Signed)
Question ischemic proctitis -[facility] -appreciate GI eval -planning for colonoscopy 9/13

## 2022-01-22 DIAGNOSIS — R935 Abnormal findings on diagnostic imaging of other abdominal regions, including retroperitoneum: Secondary | ICD-10-CM

## 2022-01-22 DIAGNOSIS — R1033 Periumbilical pain: Secondary | ICD-10-CM | POA: Diagnosis not present

## 2022-01-22 DIAGNOSIS — R194 Change in bowel habit: Secondary | ICD-10-CM

## 2022-01-22 DIAGNOSIS — N179 Acute kidney failure, unspecified: Secondary | ICD-10-CM | POA: Diagnosis not present

## 2022-01-22 DIAGNOSIS — I255 Ischemic cardiomyopathy: Secondary | ICD-10-CM | POA: Diagnosis not present

## 2022-01-22 DIAGNOSIS — R634 Abnormal weight loss: Secondary | ICD-10-CM | POA: Diagnosis not present

## 2022-01-22 DIAGNOSIS — K6289 Other specified diseases of anus and rectum: Secondary | ICD-10-CM | POA: Diagnosis not present

## 2022-01-22 DIAGNOSIS — E876 Hypokalemia: Secondary | ICD-10-CM | POA: Diagnosis not present

## 2022-01-22 DIAGNOSIS — F1020 Alcohol dependence, uncomplicated: Secondary | ICD-10-CM | POA: Diagnosis not present

## 2022-01-22 DIAGNOSIS — R627 Adult failure to thrive: Secondary | ICD-10-CM | POA: Diagnosis not present

## 2022-01-22 LAB — HCV RNA QUANT: HCV Quantitative: NOT DETECTED IU/mL (ref >50)

## 2022-01-22 LAB — BASIC METABOLIC PANEL
Anion gap: 8 (ref 5–15)
BUN: 6 mg/dL — ABNORMAL LOW (ref 8–23)
CO2: 23 mmol/L (ref 22–32)
Calcium: 8.2 mg/dL — ABNORMAL LOW (ref 8.9–10.3)
Chloride: 107 mmol/L (ref 98–111)
Creatinine, Ser: 0.89 mg/dL (ref 0.61–1.24)
GFR, Estimated: >60 mL/min (ref >60)
Glucose, Bld: 89 mg/dL (ref 70–99)
Potassium: 3.2 mmol/L — ABNORMAL LOW (ref 3.5–5.1)
Sodium: 138 mmol/L (ref 135–145)

## 2022-01-22 LAB — MAGNESIUM: Magnesium: 1.6 mg/dL — ABNORMAL LOW (ref 1.7–2.4)

## 2022-01-22 MED ORDER — PEG 3350-KCL-NA BICARB-NACL 420 G PO SOLR
4000.0000 mL | Freq: Once | ORAL | Status: AC
  Administered 2022-01-22: 4000 mL via ORAL

## 2022-01-22 MED ORDER — CARVEDILOL 3.125 MG PO TABS
6.2500 mg | ORAL_TABLET | Freq: Two times a day (BID) | ORAL | Status: DC
  Administered 2022-01-22 – 2022-01-23 (×2): 6.25 mg via ORAL
  Filled 2022-01-22 (×2): qty 2

## 2022-01-22 MED ORDER — POTASSIUM CHLORIDE CRYS ER 20 MEQ PO TBCR
40.0000 meq | EXTENDED_RELEASE_TABLET | Freq: Once | ORAL | Status: AC
  Administered 2022-01-22: 40 meq via ORAL
  Filled 2022-01-22: qty 2

## 2022-01-22 MED ORDER — MAGNESIUM OXIDE -MG SUPPLEMENT 400 (240 MG) MG PO TABS
400.0000 mg | ORAL_TABLET | Freq: Two times a day (BID) | ORAL | Status: DC
  Administered 2022-01-22 – 2022-01-23 (×2): 400 mg via ORAL
  Filled 2022-01-22 (×2): qty 1

## 2022-01-22 NOTE — Progress Notes (Signed)
Progress Note  Patient Name: Jonathan Benitez Date of Encounter: 01/22/2022  Primary Cardiologist: New to [facility]  Subjective   Up in room this morning, reports no chest pain or dizziness.  Drinking clear liquid diet, but with plan for colonoscopy tomorrow.  Inpatient Medications    Scheduled Meds:  carvedilol  3.125 mg Oral BID WC   dapagliflozin propanediol  10 mg Oral Daily   heparin  5,000 Units Subcutaneous Q8H   hydrocortisone  25 mg Rectal BID   loratadine  10 mg Oral Daily   magnesium oxide  400 mg Oral Daily   mirtazapine  15 mg Oral QHS   pantoprazole  40 mg Oral Daily   rosuvastatin  10 mg Oral Daily   thiamine  100 mg Oral Daily    PRN Meds: acetaminophen **OR** acetaminophen, fluticasone, ondansetron **OR** ondansetron (ZOFRAN) IV, mouth rinse   Vital Signs    Vitals:   01/20/22 2112 01/21/22 1345 01/21/22 1943 01/22/22 0513  BP: (!) 142/92 137/86 (!) 156/99 (!) 132/97  Pulse: 97 92 88 95  Resp: 19 20    Temp: 97.9 F (36.6 C) 97.9 F (36.6 C) 98.1 F (36.7 C) 98.4 F (36.9 C)  TempSrc: Oral Oral Oral Oral  SpO2: 100% 100% 100% 98%  Weight:      Height:        Intake/Output Summary (Last 24 hours) at 01/22/2022 0919 Last data filed at 01/22/2022 0500 Gross per 24 hour  Intake 770 ml  Output --  Net 770 ml   Filed Weights   01/19/22 0751  Weight: 79.4 kg    Telemetry    Sinus rhythm.  Personally reviewed.  ECG    No ECG reviewed.  Physical Exam   GEN: No acute distress.  Neck: No JVD. Cardiac: RRR, 1/6 systolic murmur, no gallop.  Respiratory: Nonlabored. Clear to auscultation bilaterally. GI: Soft, nontender, bowel sounds present. MS: No edema. Neuro:  Nonfocal. Psych: Alert and oriented x 3. Normal affect.  Labs    Chemistry Recent Labs  Lab 01/19/22 0800 01/20/22 0505 01/21/22 0440 01/22/22 0351  NA 138 139 138 138  K 2.7* 2.9* 3.8 3.2*  CL 99 106 107 107  CO2 '24 25 25 23  '$ GLUCOSE 114* 84 98 89  BUN 12  8 5* 6*  CREATININE 2.88* 0.99 0.86 0.89  CALCIUM 8.6* 7.6* 7.8* 8.2*  PROT 9.4* 6.7  --   --   ALBUMIN 4.5 3.3*  --   --   AST 58* 37  --   --   ALT 42 28  --   --   ALKPHOS 65 46  --   --   BILITOT 1.4* 1.2  --   --   GFRNONAA 23* >60 >60 >60  ANIONGAP '15 8 6 8     '$ Hematology Recent Labs  Lab 01/19/22 0800 01/20/22 0505  WBC 7.0 4.9  RBC 4.33 3.39*  HGB 13.9 11.0*  HCT 41.0 33.1*  MCV 94.7 97.6  MCH 32.1 32.4  MCHC 33.9 33.2  RDW 14.5 14.5  PLT 179 125*    Cardiac Enzymes Recent Labs  Lab 01/19/22 0800 01/19/22 1102  TROPONINIHS 74* 62*    DDimer Recent Labs  Lab 01/19/22 0800  DDIMER 0.42     Radiology    CT Angio Abd/Pel w/ and/or w/o  Result Date: 01/21/2022 CLINICAL DATA:  Decreased appetite, postprandial pain and weight loss. EXAM: CT ANGIOGRAPHY ABDOMEN AND PELVIS WITH CONTRAST AND WITHOUT  CONTRAST TECHNIQUE: Multidetector CT imaging of the abdomen and pelvis was performed using the standard protocol during bolus administration of intravenous contrast. Multiplanar reconstructed images and MIPs were obtained and reviewed to evaluate the vascular anatomy. RADIATION DOSE REDUCTION: This exam was performed according to the departmental dose-optimization program which includes automated exposure control, adjustment of the mA and/or kV according to patient size and/or use of iterative reconstruction technique. CONTRAST:  113m OMNIPAQUE IOHEXOL 350 MG/ML SOLN COMPARISON:  Prior CTA of the abdomen and pelvis on 01/03/2021. CT of the abdomen and pelvis without contrast on 01/19/2022. FINDINGS: VASCULAR Aorta: Stable atherosclerosis without evidence of aneurysm or dissection. Celiac: Stable heavily calcified plaque at the origin of the celiac axis causing high-grade stenosis/subtotal occlusive disease. Predominant flow likely coming from collateral flow via the SMA given prominent pancreaticoduodenal vessels. SMA: Heavily calcified plaque again noted at the origin and  proximal trunk of the SMA. This causes up to approximately 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 at the origin best seen on coronal imaging. Renals: Single renal arteries again noted bilaterally without significant stenosis. IMA: Calcified plaque again noted at origin which appears stable. IMA remains patent. Inflow: Stable atherosclerosis. No significant obstructive disease or aneurysmal disease. Proximal Outflow: Stable atherosclerosis of the common femoral arteries and femoral bifurcations without significant obstructive disease. Veins: Venous phase imaging demonstrates normally patent venous structures in the abdomen and pelvis. Review of the MIP images confirms the above findings. NON-VASCULAR Lower chest: No acute abnormality. Hepatobiliary: No focal liver abnormality is seen. No gallstones, gallbladder wall thickening, or biliary dilatation. Pancreas: Unremarkable. No pancreatic ductal dilatation or surrounding inflammatory changes. Spleen: Normal in size without focal abnormality. Adrenals/Urinary Tract: Adrenal glands are unremarkable. Kidneys are normal, without renal calculi, focal lesion, or hydronephrosis. Bladder is unremarkable. Stomach/Bowel: Diaphragmatic hiatus and gastric morphology proximally is consistent with a component a sliding hiatal hernia which appears relatively produced at the time of current imaging. Bowel shows no evidence of obstruction, ileus, inflammation or lesion. The appendix demonstrates small calcified appendicolith at the tip with no evidence of acute appendicitis. Stable diverticulosis of the sigmoid and descending colon without evidence of acute diverticulitis. No free intraperitoneal air. Lymphatic: No enlarged abdominal or pelvic lymph nodes. Reproductive: Prostate is unremarkable. Other: No abdominal wall hernia or abnormality. No abdominopelvic ascites. Musculoskeletal: No acute or significant osseous findings. IMPRESSION: 1. 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 2. Stable atherosclerosis involving proximal SMA and IMA without high-grade stenosis. Maximal narrowing of the proximal SMA again approaches 50% caliber. 3. Calcified appendicolith at the tip of the appendix without evidence acute appendicitis. 4. Diverticulosis of the descending and sigmoid colon without evidence of diverticulitis. Electronically Signed   By: GAletta EdouardM.D.   On: 01/21/2022 15:48   ECHOCARDIOGRAM COMPLETE  Result Date: 01/20/2022    ECHOCARDIOGRAM REPORT   Patient Name:   RNESTER BACHUSDate of Exam: 01/20/2022 Medical Rec #:  [id number]       Height:       72.0 in Accession #:    [id number]      Weight:       175.0 lb Date of Birth:  9January 12, 1952       BSA:          2.013 m Patient Age:    89years         BP:           163/91  mmHg Patient Gender: M                HR:           85 bpm. Exam Location:  [facility] Procedure: 2D Echo, Cardiac Doppler and Color Doppler Indications:    Dyspnea R06.00  History:        Patient has no prior history of Echocardiogram examinations.                 Risk Factors:Hypertension. Hx of Alcoholism, Marijuana abuse,                 Prostate cancer.  Sonographer:    Alvino Chapel RCS Referring Phys: [phone] DAVID TAT IMPRESSIONS  1. Left ventricular ejection fraction, by estimation, is 45 to 50%. The left ventricle has mildly decreased function. The left ventricle demonstrates regional wall motion abnormalities (see scoring diagram/findings for description). There is mild asymmetric left ventricular hypertrophy of the basal-septal segment. There is hypokinesis of the mid-to-apical lateral LV segments, all apical segments, and apex. Indeterminate diastolic filling due to E-A fusion.  2. Right ventricular systolic function is normal. The right ventricular size is normal.  3. Left atrial size was mild to moderately dilated.  4. The mitral valve is abnormal. Trivial mitral valve  regurgitation. Moderate to severe mitral annular calcification.  5. The aortic valve is tricuspid. There is mild calcification of the aortic valve. There is mild thickening of the aortic valve. Aortic valve regurgitation is not visualized. Aortic valve sclerosis/calcification is present, without any evidence of aortic stenosis.  6. Aortic dilatation noted. There is borderline dilatation of the aortic root, measuring 37 mm.  7. The inferior vena cava is normal in size with greater than 50% respiratory variability, suggesting right atrial pressure of 3 mmHg. FINDINGS  Left Ventricle: Left ventricular ejection fraction, by estimation, is 45 to 50%. The left ventricle has mildly decreased function. The left ventricle demonstrates regional wall motion abnormalities. The left ventricular internal cavity size was normal in size. There is mild asymmetric left ventricular hypertrophy of the basal-septal segment. There is hypokinesis of the mid-to-apical lateral LV segments, all apical segments and apex. Indeterminate diastolic filling due to E-A fusion. Right Ventricle: The right ventricular size is normal. No increase in right ventricular wall thickness. Right ventricular systolic function is normal. Left Atrium: Left atrial size was mild to moderately dilated. Right Atrium: Right atrial size was normal in size. Pericardium: There is no evidence of pericardial effusion. Mitral Valve: The mitral valve is abnormal. There is mild thickening of the mitral valve leaflet(s). There is mild calcification of the mitral valve leaflet(s). Moderate to severe mitral annular calcification. Trivial mitral valve regurgitation. Tricuspid Valve: The tricuspid valve is normal in structure. Tricuspid valve regurgitation is trivial. Aortic Valve: The aortic valve is tricuspid. There is mild calcification of the aortic valve. There is mild thickening of the aortic valve. Aortic valve regurgitation is not visualized. Aortic valve  sclerosis/calcification is present, without any evidence of aortic stenosis. Pulmonic Valve: The pulmonic valve was normal in structure. Pulmonic valve regurgitation is trivial. Aorta: Aortic dilatation noted. There is borderline dilatation of the aortic root, measuring 37 mm. Venous: The inferior vena cava is normal in size with greater than 50% respiratory variability, suggesting right atrial pressure of 3 mmHg. IAS/Shunts: The atrial septum is grossly normal.  LEFT VENTRICLE PLAX 2D LVIDd:         4.40 cm   Diastology LVIDs:  2.40 cm   LV e' lateral:   9.36 cm/s LV PW:         1.00 cm   LV E/e' lateral: 12.2 LV IVS:        1.00 cm LVOT diam:     2.20 cm LV SV:         62 LV SV Index:   31 LVOT Area:     3.80 cm  RIGHT VENTRICLE RV S prime:     12.90 cm/s TAPSE (M-mode): 2.3 cm LEFT ATRIUM             Index        RIGHT ATRIUM           Index LA diam:        3.20 cm 1.59 cm/m   RA Area:     15.30 cm LA Vol (A2C):   82.5 ml 40.98 ml/m  RA Volume:   39.90 ml  19.82 ml/m LA Vol (A4C):   59.4 ml 29.51 ml/m LA Biplane Vol: 75.3 ml 37.41 ml/m  AORTIC VALVE LVOT Vmax:   99.00 cm/s LVOT Vmean:  65.000 cm/s LVOT VTI:    0.162 m  AORTA Ao Root diam: 3.70 cm MITRAL VALVE MV Area (PHT): 5.38 cm     SHUNTS MV Decel Time: 141 msec     Systemic VTI:  0.16 m MV E velocity: 114.00 cm/s  Systemic Diam: 2.20 cm Gwyndolyn Kaufman MD Electronically signed by Gwyndolyn Kaufman MD Signature Date/Time: 01/20/2022/1:24:16 PM    Final     Assessment & Plan    1.  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  Also with regular alcohol intake which could be a contributor to LV dysfunction as well.  Clinically stable at this time and undergoing initiation of basic GDMT.  Has been started on Coreg and [place].  Blood pressure trending up, he initially presented hypotensive and orthostatic in the setting of intravascular volume depletion.  Have held off ARB/ARNI so  far.  2.  Colonic diverticulosis and rectal wall thickening by CT suggesting mild proctitis.  3.  Recurrent abdominal pain.  Being evaluated by GI service with plan for colonoscopy tomorrow.  Also has evidence of intestinal ischemia by CTA of the abdomen, findings described as stable.  4.  Acute renal insufficiency, creatinine 2.88 at presentation, now normalized with IV fluids.  Increase Coreg to 6.25 mg twice daily, continue [place].  We will eventually add Entresto next.  Continue to follow during ongoing GI work-up.  Do not anticipate inpatient ischemic testing however.  Signed, Rozann Lesches, MD  01/22/2022, 9:19 AM

## 2022-01-22 NOTE — Progress Notes (Addendum)
PROGRESS NOTE  Chales LIJAH BOURQUE UYQ:[id number] DOB: 1950/09/27 DOA: 01/19/2022 PCP: Lindell Spar, MD  Brief History:  71 year old male with a history of hypertension, prostate cancer status post radiotherapy, GERD, and gouty arthritis presenting with 2-week history of generalized weakness, dyspnea on exertion, and dizziness.  The patient states that he has had decreased oral intake for the past month.  His anorexia has worsened in the last 2 weeks.  He states that every time he eats something solid he has some loose stools.  He denies any hematochezia or melena, but he states that he has some upper abdominal pain right greater than left upper quadrant.  He denies any nausea, vomiting, fevers, chills.  He has some occasional dysuria.  In the last 4 days, his dyspnea exertion and generalized weakness have worsened.  As result, he presented for further evaluation and treatment. In particular, the patient denies any headache, visual disturbance, focal extremity weakness, chest pain, palpitations, coughing, hemoptysis.  He has not been started on any new medications.  He states that he smokes marijuana on a daily basis.  He denies any illicit drug use.  He states that he drinks a beer or 2 approximately 2 to 3 days/week. In the emergency department, the patient was afebrile and hemodynamically stable.  He was tachycardic in the 110s.  Blood pressure was soft in the 100s.  Oxygen saturation was 100% room air.  WBC 7.0, hemoglobin 13.9, platelets 179,000.  AST 58, ALT 42, alk phosphatase 65, total bilirubin 1.4.  BMP showed a sodium 138, potassium 2.7, bicarbonate 24, BUN 12, creatinine 2.88.  COVID PCR was negative.  Chest x-ray was negative.  EKG shows sinus tachycardia with T wave inversions V4-V5, 1 and 3.  Troponin was 74.  D-dimer 0.42.  Patient was given 2 L LR and 50 milli equivalents of KCl in the emergency department.  Overall, the patient clinically improved.  Work-up including  echocardiogram showed EF 45 to 50%.  Cardiology was consulted for the patient's new cardiomyopathy.  He remained clinically euvolemic.  For his abdominal pain, CT abdomen was obtained and showed proctitis.  He was felt to have mesenteric ischemia.  GI was consulted.     Assessment and Plan: * AKI (acute kidney injury) ([place]) Baseline creatinine 0.8-1.0 Presented with serum creatinine 2.8 Secondary to volume depletion Continue IV fluids>>improved  Proctitis Question ischemic proctitis -Anusol HC -appreciate GI eval -planning for colonoscopy 9/13  Cardiomyopathy ([place]) Newly diagnosed this admission 01/20/22 echo--EF 45-50% Start coreg and [place] Cardiology consult appreciated Suspect maybe partly related to Etoh -rosuvastatin started  Hypomagnesemia Replete Start daily mag ox>>increase to bid  Transaminasemia Likely due to alcohol use Trend CT abdomen pelvis--proctitis; diverticulosis Hep B surface antigen Hep C RNA--neg  Abdominal pain Due to mesenteric ischemia CT abdomen and pelvis>>proctitis, diverticulosis --start anusol HC supp CTA abd/pelvis--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; Stable atherosclerosis involving proximal SMA and IMA without high-grade stenosis -appreciate GI eval  Elevated troponin [place]and ischemia No chest pain presently Personally reviewed EKG--sinus rhythm, nonspecific T wave changes Echo--EF 45-50%, +WMA, trivial MR/TR  Hypokalemia Replete  Marijuana abuse Cessation discussed Patient likely has a component of cannabis hyperemesis syndrome  Failure to thrive in adult GLO--7.564 P32--951 Folic OACZ--66.0 Urinalysis--no significant pyuria Most likely due to mesenteric ischemia  Alcoholism ([place]) Patient continues to drink beers 2 to 3 days/week Start thiamine No signs of withdrawal  HTN (hypertension)  Holding lisinopril in the setting of AKI and soft BP Coreg started>>increased  to 6.25 bid  Family Communication:  no  Family at bedside   Consultants:  GI, cardiology   Code Status:  FULL    DVT Prophylaxis:  [place] Heparin   Procedures: As Listed in Progress Note Above   Antibiotics: None   Subjective:  Continues to complain of postprandial abd pain.  Denies f/c, cp, sob, n/v/d Objective: Vitals:   01/20/22 2112 01/21/22 1345 01/21/22 1943 01/22/22 0513  BP: (!) 142/92 137/86 (!) 156/99 (!) 132/97  Pulse: 97 92 88 95  Resp: 19 20    Temp: 97.9 F (36.6 C) 97.9 F (36.6 C) 98.1 F (36.7 C) 98.4 F (36.9 C)  TempSrc: Oral Oral Oral Oral  SpO2: 100% 100% 100% 98%  Weight:      Height:        Intake/Output Summary (Last 24 hours) at 01/22/2022 1618 Last data filed at 01/22/2022 1204 Gross per 24 hour  Intake 1250 ml  Output --  Net 1250 ml   Weight change:  Exam:  General:  Pt is alert, follows commands appropriately, not in acute distress HEENT: No icterus, No thrush, No neck mass, [place]/AT Cardiovascular: RRR, S1/S2, no rubs, no gallops Respiratory: diminshed BS but CTA bilaterally, no wheezing, no crackles, no rhonchi Abdomen: Soft/+BS, non tender, non distended, no guarding Extremities: No edema, No lymphangitis, No petechiae, No rashes, no synovitis   Data Reviewed: I have personally reviewed following labs and imaging studies Basic Metabolic Panel: Recent Labs  Lab 01/19/22 0800 01/20/22 0505 01/21/22 0440 01/22/22 0351  NA 138 139 138 138  K 2.7* 2.9* 3.8 3.2*  CL 99 106 107 107  CO2 $Re'24 25 25 23  'rSq$ GLUCOSE 114* 84 98 89  BUN 12 8 5* 6*  CREATININE 2.88* 0.99 0.86 0.89  CALCIUM 8.6* 7.6* 7.8* 8.2*  MG 1.0* 1.2* 1.4* 1.6*   Liver Function Tests: Recent Labs  Lab 01/19/22 0800 01/20/22 0505  AST 58* 37  ALT 42 28  ALKPHOS 65 46  BILITOT 1.4* 1.2  PROT 9.4* 6.7  ALBUMIN 4.5 3.3*   Recent Labs  Lab 01/19/22 1607  LIPASE 48   No results for input(s): "AMMONIA" in the last 168 hours. Coagulation Profile: No results  for input(s): "INR", "PROTIME" in the last 168 hours. CBC: Recent Labs  Lab 01/19/22 0800 01/20/22 0505  WBC 7.0 4.9  NEUTROABS 4.3  --   HGB 13.9 11.0*  HCT 41.0 33.1*  MCV 94.7 97.6  PLT 179 125*   Cardiac Enzymes: Recent Labs  Lab 01/19/22 1607  CKTOTAL 129   BNP: Invalid input(s): "POCBNP" CBG: No results for input(s): "GLUCAP" in the last 168 hours. HbA1C: No results for input(s): "HGBA1C" in the last 72 hours. Urine analysis:    Component Value Date/Time   COLORURINE AMBER (A) 01/19/2022 1207   APPEARANCEUR CLOUDY (A) 01/19/2022 1207   LABSPEC 1.028 01/19/2022 1207   PHURINE 5.0 01/19/2022 1207   GLUCOSEU NEGATIVE 01/19/2022 1207   HGBUR NEGATIVE 01/19/2022 1207   BILIRUBINUR SMALL (A) 01/19/2022 1207   KETONESUR 5 (A) 01/19/2022 1207   PROTEINUR 100 (A) 01/19/2022 1207   UROBILINOGEN 0.2 12/17/2013 0825   NITRITE NEGATIVE 01/19/2022 1207   LEUKOCYTESUR NEGATIVE 01/19/2022 1207   Sepsis Labs: $[email or web address]'@[email or web address]'[email or web address]$ (procalcitonin:4,lacticidven:4) ) Recent Results (from the past 240 hour(s))  Resp Panel by RT-PCR (Flu A&B, Covid) Anterior Nasal Swab     Status: None   Collection Time: 01/19/22  8:18 AM   Specimen: Anterior Nasal Swab  Result Value Ref Range Status   SARS Coronavirus 2 by RT PCR NEGATIVE NEGATIVE Final    Comment: (NOTE) SARS-CoV-2 target nucleic acids are NOT DETECTED.  The SARS-CoV-2 RNA is generally detectable in upper respiratory specimens during the acute phase of infection. The lowest concentration of SARS-CoV-2 viral copies this assay can detect is 138 copies/mL. A negative result does not preclude SARS-Cov-2 infection and should not be used as the sole basis for treatment or other patient management decisions. A negative result may occur with  improper specimen collection/handling, submission of specimen other than nasopharyngeal swab, presence of viral mutation(s) within the areas targeted by this assay, and inadequate number of  viral copies(<138 copies/mL). A negative result must be combined with clinical observations, patient history, and epidemiological information. The expected result is Negative.  Fact Sheet for Patients:  EntrepreneurPulse.com.au  Fact Sheet for Healthcare Providers:  IncredibleEmployment.be  This test is no t yet approved or cleared by the [place] FDA and  has been authorized for detection and/or diagnosis of SARS-CoV-2 by FDA under an Emergency Use Authorization (EUA). This EUA will remain  in effect (meaning this test can be used) for the duration of the COVID-19 declaration under Section 564(b)(1) of the Act, 21 U.S.C.section 360bbb-3(b)(1), unless the authorization is terminated  or revoked sooner.       Influenza A by PCR NEGATIVE NEGATIVE Final   Influenza B by PCR NEGATIVE NEGATIVE Final    Comment: (NOTE) The Xpert Xpress SARS-CoV-2/FLU/RSV plus assay is intended as an aid in the diagnosis of influenza from Nasopharyngeal swab specimens and should not be used as a sole basis for treatment. Nasal washings and aspirates are unacceptable for Xpert Xpress SARS-CoV-2/FLU/RSV testing.  Fact Sheet for Patients: EntrepreneurPulse.com.au  Fact Sheet for Healthcare Providers: IncredibleEmployment.be  This test is not yet approved or cleared by the [place] FDA and has been authorized for detection and/or diagnosis of SARS-CoV-2 by FDA under an Emergency Use Authorization (EUA). This EUA will remain in effect (meaning this test can be used) for the duration of the COVID-19 declaration under Section 564(b)(1) of the Act, 21 U.S.C. section 360bbb-3(b)(1), unless the authorization is terminated or revoked.  Performed at [facility], [address] [place], [place] [address]   Urine Culture     Status: Abnormal   Collection Time: 01/19/22 12:07 PM   Specimen: Urine, Clean Catch  Result Value Ref  Range Status   Specimen Description   Final    URINE, CLEAN CATCH Performed at [facility], [address] [place], [place] [address]    Special Requests   Final    NONE Performed at [facility], [address], [place], [place] [address]    Culture MULTIPLE SPECIES PRESENT, SUGGEST RECOLLECTION (A)  Final   Report Status 01/21/2022 FINAL  Final     Scheduled Meds:  carvedilol  6.25 mg Oral BID WC   dapagliflozin propanediol  10 mg Oral Daily   heparin  5,000 Units Subcutaneous Q8H   hydrocortisone  25 mg Rectal BID   loratadine  10 mg Oral Daily   magnesium oxide  400 mg Oral BID   mirtazapine  15 mg Oral QHS   pantoprazole  40 mg Oral Daily   polyethylene glycol-electrolytes  4,000 mL Oral Once   potassium chloride  40 mEq Oral Once   rosuvastatin  10 mg Oral Daily   thiamine  100 mg Oral Daily  Continuous Infusions:  Procedures/Studies: CT Angio Abd/Pel w/ and/or w/o  Result Date: 01/21/2022 CLINICAL DATA:  Decreased appetite, postprandial pain and weight loss. EXAM: CT ANGIOGRAPHY ABDOMEN AND PELVIS WITH CONTRAST AND WITHOUT CONTRAST TECHNIQUE: Multidetector CT imaging of the abdomen and pelvis was performed using the standard protocol during bolus administration of intravenous contrast. Multiplanar reconstructed images and MIPs were obtained and reviewed to evaluate the vascular anatomy. RADIATION DOSE REDUCTION: This exam was performed according to the departmental dose-optimization program which includes automated exposure control, adjustment of the mA and/or kV according to patient size and/or use of iterative reconstruction technique. CONTRAST:  171mL OMNIPAQUE IOHEXOL 350 MG/ML SOLN COMPARISON:  Prior CTA of the abdomen and pelvis on 01/03/2021. CT of the abdomen and pelvis without contrast on 01/19/2022. FINDINGS: VASCULAR Aorta: Stable atherosclerosis without evidence of aneurysm or dissection. Celiac: Stable heavily calcified plaque at the origin of the celiac axis  causing high-grade stenosis/subtotal occlusive disease. Predominant flow likely coming from collateral flow via the SMA given prominent pancreaticoduodenal vessels. SMA: Heavily calcified plaque again noted at the origin and proximal trunk of the SMA. This causes up to approximately 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 at the origin best seen on coronal imaging. Renals: Single renal arteries again noted bilaterally without significant stenosis. IMA: Calcified plaque again noted at origin which appears stable. IMA remains patent. Inflow: Stable atherosclerosis. No significant obstructive disease or aneurysmal disease. Proximal Outflow: Stable atherosclerosis of the common femoral arteries and femoral bifurcations without significant obstructive disease. Veins: Venous phase imaging demonstrates normally patent venous structures in the abdomen and pelvis. Review of the MIP images confirms the above findings. NON-VASCULAR Lower chest: No acute abnormality. Hepatobiliary: No focal liver abnormality is seen. No gallstones, gallbladder wall thickening, or biliary dilatation. Pancreas: Unremarkable. No pancreatic ductal dilatation or surrounding inflammatory changes. Spleen: Normal in size without focal abnormality. Adrenals/Urinary Tract: Adrenal glands are unremarkable. Kidneys are normal, without renal calculi, focal lesion, or hydronephrosis. Bladder is unremarkable. Stomach/Bowel: Diaphragmatic hiatus and gastric morphology proximally is consistent with a component a sliding hiatal hernia which appears relatively produced at the time of current imaging. Bowel shows no evidence of obstruction, ileus, inflammation or lesion. The appendix demonstrates small calcified appendicolith at the tip with no evidence of acute appendicitis. Stable diverticulosis of the sigmoid and descending colon without evidence of acute diverticulitis. No free intraperitoneal air. Lymphatic: No enlarged abdominal or pelvic lymph nodes. Reproductive: Prostate is  unremarkable. Other: No abdominal wall hernia or abnormality. No abdominopelvic ascites. Musculoskeletal: No acute or significant osseous findings. IMPRESSION: 1. 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 2. Stable atherosclerosis involving proximal SMA and IMA without high-grade stenosis. Maximal narrowing of the proximal SMA again approaches 50% caliber. 3. Calcified appendicolith at the tip of the appendix without evidence acute appendicitis. 4. Diverticulosis of the descending and sigmoid colon without evidence of diverticulitis. Electronically Signed   By: Aletta Edouard M.D.   On: 01/21/2022 15:48   ECHOCARDIOGRAM COMPLETE  Result Date: 01/20/2022    ECHOCARDIOGRAM REPORT   Patient Name:   KYNG MATLOCK Date of Exam: 01/20/2022 Medical Rec #:  [id number]        Height:       72.0 in Accession #:    [id number]       Weight:       175.0 lb Date of Birth:  1950-07-15        BSA:  2.013 m Patient Age:    69 years         BP:           163/91 mmHg Patient Gender: M                HR:           85 bpm. Exam Location:  [facility] Procedure: 2D Echo, Cardiac Doppler and Color Doppler Indications:    Dyspnea R06.00  History:        Patient has no prior history of Echocardiogram examinations.                 Risk Factors:Hypertension. Hx of Alcoholism, Marijuana abuse,                 Prostate cancer.  Sonographer:    Alvino Chapel RCS Referring Phys: [phone] Prayan Ulin IMPRESSIONS  1. Left ventricular ejection fraction, by estimation, is 45 to 50%. The left ventricle has mildly decreased function. The left ventricle demonstrates regional wall motion abnormalities (see scoring diagram/findings for description). There is mild asymmetric left ventricular hypertrophy of the basal-septal segment. There is hypokinesis of the mid-to-apical lateral LV segments, all apical segments, and apex. Indeterminate diastolic  filling due to E-A fusion.  2. Right ventricular systolic function is normal. The right ventricular size is normal.  3. Left atrial size was mild to moderately dilated.  4. The mitral valve is abnormal. Trivial mitral valve regurgitation. Moderate to severe mitral annular calcification.  5. The aortic valve is tricuspid. There is mild calcification of the aortic valve. There is mild thickening of the aortic valve. Aortic valve regurgitation is not visualized. Aortic valve sclerosis/calcification is present, without any evidence of aortic stenosis.  6. Aortic dilatation noted. There is borderline dilatation of the aortic root, measuring 37 mm.  7. The inferior vena cava is normal in size with greater than 50% respiratory variability, suggesting right atrial pressure of 3 mmHg. FINDINGS  Left Ventricle: Left ventricular ejection fraction, by estimation, is 45 to 50%. The left ventricle has mildly decreased function. The left ventricle demonstrates regional wall motion abnormalities. The left ventricular internal cavity size was normal in size. There is mild asymmetric left ventricular hypertrophy of the basal-septal segment. There is hypokinesis of the mid-to-apical lateral LV segments, all apical segments and apex. Indeterminate diastolic filling due to E-A fusion. Right Ventricle: The right ventricular size is normal. No increase in right ventricular wall thickness. Right ventricular systolic function is normal. Left Atrium: Left atrial size was mild to moderately dilated. Right Atrium: Right atrial size was normal in size. Pericardium: There is no evidence of pericardial effusion. Mitral Valve: The mitral valve is abnormal. There is mild thickening of the mitral valve leaflet(s). There is mild calcification of the mitral valve leaflet(s). Moderate to severe mitral annular calcification. Trivial mitral valve regurgitation. Tricuspid Valve: The tricuspid valve is normal in structure. Tricuspid valve regurgitation is  trivial. Aortic Valve: The aortic valve is tricuspid. There is mild calcification of the aortic valve. There is mild thickening of the aortic valve. Aortic valve regurgitation is not visualized. Aortic valve sclerosis/calcification is present, without any evidence of aortic stenosis. Pulmonic Valve: The pulmonic valve was normal in structure. Pulmonic valve regurgitation is trivial. Aorta: Aortic dilatation noted. There is borderline dilatation of the aortic root, measuring 37 mm. Venous: The inferior vena cava is normal in size with greater than 50% respiratory variability, suggesting right atrial pressure of 3 mmHg. IAS/Shunts: The atrial  septum is grossly normal.  LEFT VENTRICLE PLAX 2D LVIDd:         4.40 cm   Diastology LVIDs:         2.40 cm   LV e' lateral:   9.36 cm/s LV PW:         1.00 cm   LV E/e' lateral: 12.2 LV IVS:        1.00 cm LVOT diam:     2.20 cm LV SV:         62 LV SV Index:   31 LVOT Area:     3.80 cm  RIGHT VENTRICLE RV S prime:     12.90 cm/s TAPSE (M-mode): 2.3 cm LEFT ATRIUM             Index        RIGHT ATRIUM           Index LA diam:        3.20 cm 1.59 cm/m   RA Area:     15.30 cm LA Vol (A2C):   82.5 ml 40.98 ml/m  RA Volume:   39.90 ml  19.82 ml/m LA Vol (A4C):   59.4 ml 29.51 ml/m LA Biplane Vol: 75.3 ml 37.41 ml/m  AORTIC VALVE LVOT Vmax:   99.00 cm/s LVOT Vmean:  65.000 cm/s LVOT VTI:    0.162 m  AORTA Ao Root diam: 3.70 cm MITRAL VALVE MV Area (PHT): 5.38 cm     SHUNTS MV Decel Time: 141 msec     Systemic VTI:  0.16 m MV E velocity: 114.00 cm/s  Systemic Diam: 2.20 cm Gwyndolyn Kaufman MD Electronically signed by Gwyndolyn Kaufman MD Signature Date/Time: 01/20/2022/1:24:16 PM    Final    CT ABDOMEN PELVIS WO CONTRAST  Result Date: 01/19/2022 CLINICAL DATA:  Abdominal pain for 2 weeks. Anorexia. Weight loss. Personal history of prostate carcinoma. EXAM: CT ABDOMEN AND PELVIS WITHOUT CONTRAST TECHNIQUE: Multidetector CT imaging of the abdomen and pelvis was performed  following the standard protocol without IV contrast. RADIATION DOSE REDUCTION: This exam was performed according to the departmental dose-optimization program which includes automated exposure control, adjustment of the mA and/or kV according to patient size and/or use of iterative reconstruction technique. COMPARISON:  01/03/2021 FINDINGS: Lower chest: No acute findings. Hepatobiliary: No mass visualized on this unenhanced exam. Gallbladder is unremarkable. No evidence of biliary ductal dilatation. Pancreas: No mass or inflammatory process visualized on this unenhanced exam. Spleen:  Within normal limits in size. Adrenals/Urinary tract: No evidence of urolithiasis or hydronephrosis. Stomach/Bowel: New small to moderate hiatal hernia is seen. Normal appendix visualized. Mild left colonic diverticulosis is again seen, without evidence of diverticulitis. Mild diffuse rectal wall thickening is noted, suspicious for mild proctitis. Vascular/Lymphatic: No pathologically enlarged lymph nodes identified. No evidence of abdominal aortic aneurysm. Aortic atherosclerotic calcification incidentally noted. Reproductive:  No mass or other significant abnormality. Other:  None. Musculoskeletal: No suspicious bone lesions identified. Left hip prosthesis noted. IMPRESSION: Mild diffuse rectal wall thickening, suspicious for proctitis. Colonic diverticulosis, without radiographic evidence of diverticulitis. New small to moderate hiatal hernia. Electronically Signed   By: Marlaine Hind M.D.   On: 01/19/2022 20:07   DG Chest Port 1 View  Result Date: 01/19/2022 CLINICAL DATA:  Shortness of breath. EXAM: PORTABLE CHEST 1 VIEW COMPARISON:  04/26/2020 FINDINGS: 0849 hours. The lungs are clear without focal pneumonia, edema, pneumothorax or pleural effusion. The cardiopericardial silhouette is within normal limits for size. Telemetry leads overlie the chest. IMPRESSION: No  active disease. Electronically Signed   By: Misty Stanley M.D.    On: 01/19/2022 08:58    Orson Eva, DO  [facility]  If 7PM-7AM, please contact night-coverage [email or web address] Password [email or web address] 01/22/2022, 4:18 PM   LOS: 0 days

## 2022-01-22 NOTE — Progress Notes (Addendum)
Subjective: Reports he is feeling well this morning. Reports chronic postprandial mid to lower abdominal pain for years. Lack of appetite. Not avoiding food due to pain, jut doesn't have an appetite. Has been having incomplete soft/loose stools for the last couple of years. Couple of Bms per day, but passes a very small amount of stool. No brbpr or melena. No rectal pain. No nausea or vomiting. Chronic heartburn is well controlled. No dysphagia.   Patient is very frustrated this morning as he thought he was going to have a colonoscopy today.   Objective: Vital signs in last 24 hours: Temp:  [97.9 F (36.6 C)-98.4 F (36.9 C)] 98.4 F (36.9 C) (09/12 0513) Pulse Rate:  [88-95] 95 (09/12 0513) Resp:  [20] 20 (09/11 1345) BP: (132-156)/(86-99) 132/97 (09/12 0513) SpO2:  [98 %-100 %] 98 % (09/12 0513) Last BM Date : 01/21/22 General:   Alert and oriented, pleasant Head:  Normocephalic and atraumatic. Eyes:  No icterus, sclera clear. Conjuctiva pink.  Abdomen:  Bowel sounds present, soft, non-distended. Mild epigastric and RLQ TTP. No HSM or hernias noted. No rebound or guarding. No masses appreciated  Msk:  Symmetrical without gross deformities. Normal posture. Pulses:  Normal pulses noted. Extremities:  Without edema. Neurologic:  Alert and  oriented x4;  grossly normal neurologically. Skin:  Warm and dry, intact without significant lesions.  Psych:  Normal mood and affect.  Intake/Output from previous day: 09/11 0701 - 09/12 0700 In: 770 [P.O.:720; IV Piggyback:50] Out: -  Intake/Output this shift: No intake/output data recorded.  Lab Results: Recent Labs    01/19/22 0800 01/20/22 0505  WBC 7.0 4.9  HGB 13.9 11.0*  HCT 41.0 33.1*  PLT 179 125*   BMET Recent Labs    01/20/22 0505 01/21/22 0440 01/22/22 0351  NA 139 138 138  K 2.9* 3.8 3.2*  CL 106 107 107  CO2 '25 25 23  '$ GLUCOSE 84 98 89  BUN 8 5* 6*  CREATININE 0.99 0.86 0.89  CALCIUM 7.6* 7.8* 8.2*    LFT Recent Labs    01/19/22 0800 01/20/22 0505  PROT 9.4* 6.7  ALBUMIN 4.5 3.3*  AST 58* 37  ALT 42 28  ALKPHOS 65 46  BILITOT 1.4* 1.2    Studies/Results: CT Angio Abd/Pel w/ and/or w/o  Result Date: 01/21/2022 CLINICAL DATA:  Decreased appetite, postprandial pain and weight loss. EXAM: CT ANGIOGRAPHY ABDOMEN AND PELVIS WITH CONTRAST AND WITHOUT CONTRAST TECHNIQUE: Multidetector CT imaging of the abdomen and pelvis was performed using the standard protocol during bolus administration of intravenous contrast. Multiplanar reconstructed images and MIPs were obtained and reviewed to evaluate the vascular anatomy. RADIATION DOSE REDUCTION: This exam was performed according to the departmental dose-optimization program which includes automated exposure control, adjustment of the mA and/or kV according to patient size and/or use of iterative reconstruction technique. CONTRAST:  178m OMNIPAQUE IOHEXOL 350 MG/ML SOLN COMPARISON:  Prior CTA of the abdomen and pelvis on 01/03/2021. CT of the abdomen and pelvis without contrast on 01/19/2022. FINDINGS: VASCULAR Aorta: Stable atherosclerosis without evidence of aneurysm or dissection. Celiac: Stable heavily calcified plaque at the origin of the celiac axis causing high-grade stenosis/subtotal occlusive disease. Predominant flow likely coming from collateral flow via the SMA given prominent pancreaticoduodenal vessels. SMA: Heavily calcified plaque again noted at the origin and proximal trunk of the SMA. This causes up to approximately 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 at the origin best seen on coronal imaging. Renals: Single renal arteries again noted bilaterally without significant  stenosis. IMA: Calcified plaque again noted at origin which appears stable. IMA remains patent. Inflow: Stable atherosclerosis. No significant obstructive disease or aneurysmal disease. Proximal Outflow: Stable atherosclerosis of the common femoral arteries and femoral bifurcations without  significant obstructive disease. Veins: Venous phase imaging demonstrates normally patent venous structures in the abdomen and pelvis. Review of the MIP images confirms the above findings. NON-VASCULAR Lower chest: No acute abnormality. Hepatobiliary: No focal liver abnormality is seen. No gallstones, gallbladder wall thickening, or biliary dilatation. Pancreas: Unremarkable. No pancreatic ductal dilatation or surrounding inflammatory changes. Spleen: Normal in size without focal abnormality. Adrenals/Urinary Tract: Adrenal glands are unremarkable. Kidneys are normal, without renal calculi, focal lesion, or hydronephrosis. Bladder is unremarkable. Stomach/Bowel: Diaphragmatic hiatus and gastric morphology proximally is consistent with a component a sliding hiatal hernia which appears relatively produced at the time of current imaging. Bowel shows no evidence of obstruction, ileus, inflammation or lesion. The appendix demonstrates small calcified appendicolith at the tip with no evidence of acute appendicitis. Stable diverticulosis of the sigmoid and descending colon without evidence of acute diverticulitis. No free intraperitoneal air. Lymphatic: No enlarged abdominal or pelvic lymph nodes. Reproductive: Prostate is unremarkable. Other: No abdominal wall hernia or abnormality. No abdominopelvic ascites. Musculoskeletal: No acute or significant osseous findings. IMPRESSION: 1. 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 2. Stable atherosclerosis involving proximal SMA and IMA without high-grade stenosis. Maximal narrowing of the proximal SMA again approaches 50% caliber. 3. Calcified appendicolith at the tip of the appendix without evidence acute appendicitis. 4. Diverticulosis of the descending and sigmoid colon without evidence of diverticulitis. Electronically Signed   By: Aletta Edouard M.D.   On: 01/21/2022 15:48    ECHOCARDIOGRAM COMPLETE  Result Date: 01/20/2022    ECHOCARDIOGRAM REPORT   Patient Name:   Jonathan Benitez Date of Exam: 01/20/2022 Medical Rec #:  [id number]        Height:       72.0 in Accession #:    [id number]       Weight:       175.0 lb Date of Birth:  1951-03-03        BSA:          2.013 m Patient Age:    71 years         BP:           163/91 mmHg Patient Gender: M                HR:           85 bpm. Exam Location:  [facility] Procedure: 2D Echo, Cardiac Doppler and Color Doppler Indications:    Dyspnea R06.00  History:        Patient has no prior history of Echocardiogram examinations.                 Risk Factors:Hypertension. Hx of Alcoholism, Marijuana abuse,                 Prostate cancer.  Sonographer:    Alvino Chapel RCS Referring Phys: [phone] DAVID TAT IMPRESSIONS  1. Left ventricular ejection fraction, by estimation, is 45 to 50%. The left ventricle has mildly decreased function. The left ventricle demonstrates regional wall motion abnormalities (see scoring diagram/findings for description). There is mild asymmetric left ventricular hypertrophy of the basal-septal segment. There is hypokinesis of the mid-to-apical lateral LV segments, all apical segments, and apex.  Indeterminate diastolic filling due to E-A fusion.  2. Right ventricular systolic function is normal. The right ventricular size is normal.  3. Left atrial size was mild to moderately dilated.  4. The mitral valve is abnormal. Trivial mitral valve regurgitation. Moderate to severe mitral annular calcification.  5. The aortic valve is tricuspid. There is mild calcification of the aortic valve. There is mild thickening of the aortic valve. Aortic valve regurgitation is not visualized. Aortic valve sclerosis/calcification is present, without any evidence of aortic stenosis.  6. Aortic dilatation noted. There is borderline dilatation of the aortic root, measuring 37 mm.  7. The inferior vena cava is normal in size with greater than  50% respiratory variability, suggesting right atrial pressure of 3 mmHg. FINDINGS  Left Ventricle: Left ventricular ejection fraction, by estimation, is 45 to 50%. The left ventricle has mildly decreased function. The left ventricle demonstrates regional wall motion abnormalities. The left ventricular internal cavity size was normal in size. There is mild asymmetric left ventricular hypertrophy of the basal-septal segment. There is hypokinesis of the mid-to-apical lateral LV segments, all apical segments and apex. Indeterminate diastolic filling due to E-A fusion. Right Ventricle: The right ventricular size is normal. No increase in right ventricular wall thickness. Right ventricular systolic function is normal. Left Atrium: Left atrial size was mild to moderately dilated. Right Atrium: Right atrial size was normal in size. Pericardium: There is no evidence of pericardial effusion. Mitral Valve: The mitral valve is abnormal. There is mild thickening of the mitral valve leaflet(s). There is mild calcification of the mitral valve leaflet(s). Moderate to severe mitral annular calcification. Trivial mitral valve regurgitation. Tricuspid Valve: The tricuspid valve is normal in structure. Tricuspid valve regurgitation is trivial. Aortic Valve: The aortic valve is tricuspid. There is mild calcification of the aortic valve. There is mild thickening of the aortic valve. Aortic valve regurgitation is not visualized. Aortic valve sclerosis/calcification is present, without any evidence of aortic stenosis. Pulmonic Valve: The pulmonic valve was normal in structure. Pulmonic valve regurgitation is trivial. Aorta: Aortic dilatation noted. There is borderline dilatation of the aortic root, measuring 37 mm. Venous: The inferior vena cava is normal in size with greater than 50% respiratory variability, suggesting right atrial pressure of 3 mmHg. IAS/Shunts: The atrial septum is grossly normal.  LEFT VENTRICLE PLAX 2D LVIDd:          4.40 cm   Diastology LVIDs:         2.40 cm   LV e' lateral:   9.36 cm/s LV PW:         1.00 cm   LV E/e' lateral: 12.2 LV IVS:        1.00 cm LVOT diam:     2.20 cm LV SV:         62 LV SV Index:   31 LVOT Area:     3.80 cm  RIGHT VENTRICLE RV S prime:     12.90 cm/s TAPSE (M-mode): 2.3 cm LEFT ATRIUM             Index        RIGHT ATRIUM           Index LA diam:        3.20 cm 1.59 cm/m   RA Area:     15.30 cm LA Vol (A2C):   82.5 ml 40.98 ml/m  RA Volume:   39.90 ml  19.82 ml/m LA Vol (A4C):   59.4 ml 29.51 ml/m LA Biplane  Vol: 75.3 ml 37.41 ml/m  AORTIC VALVE LVOT Vmax:   99.00 cm/s LVOT Vmean:  65.000 cm/s LVOT VTI:    0.162 m  AORTA Ao Root diam: 3.70 cm MITRAL VALVE MV Area (PHT): 5.38 cm     SHUNTS MV Decel Time: 141 msec     Systemic VTI:  0.16 m MV E velocity: 114.00 cm/s  Systemic Diam: 2.20 cm Gwyndolyn Kaufman MD Electronically signed by Gwyndolyn Kaufman MD Signature Date/Time: 01/20/2022/1:24:16 PM    Final     Assessment: 71 y.o. year old male with history of chronic GERD, chronic Hep C s/p treatment through the [facility] years ago, history of IDA, HTN, prostate cancer in the past, presented to the emergency room on 9/9 with approximately 4 weeks of generalized weakness, shortness of breath, decreased appetite.  He was found to have AKI, newly diagnosed cardiomyopathy with EF 45-50%, mildly elevated AST and mildly elevated bilirubin now normalized, CT A/P without contrast with mild diffuse rectal wall thickening, suspicious for proctitis.  GI consulted due to proctitis.   Lower abdominal pain and weight loss: Postprandial mid to lower abdominal pain for the last few years without change. Noted lack of appetite and documented 9 pound weight loss over the last 8 months. Denies any significant upper GI symptoms. Chronic GERD is well controlled. Denies nausea, vomiting, or dysphagia. He has noticed change in bowel habits with small, incomplete loose Bms for the last couple of years, questionable  proctitis on CT this admission, and he will endoscopic follow-up of this. There is also concern for component of chronic mesenteric ischemia. 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 He will need outpatient vascular evaluation. I will reach out to them today to see if we can get him in for a follow-up.   Proctitis:  CT A/P without contrast with mild diffuse rectal wall thickening, suspicious for proctitis. CT angio A/P yesterday without obvious bowel wall thickening. He does report change in bowel habits in the last couple years having soft, loose, small, incomplete bowel movements, but denies BRBPR or melena.  He has had unintentional weight loss as discussed above.  Last colonoscopy 2019 with one 6 mm tubular adenoma removed, sigmoid and transverse diverticulosis, hemorrhoids.  We will plan to go ahead and repeat colonoscopy this admission.   Plan: Clear liquid diet today.  Npo at midnight.  Colonoscopy with propofol with Dr. Abbey Chatters tomorrow. The risks, benefits, and alternatives have been discussed with the patient in detail. The patient states understanding and desires to proceed.  Hold heparin starting at midnight.  Plan to review case with vascular surgery and see about getting him scheduled for an outpatient follow-up.   Continue PPI daily.     LOS: 0 days    01/22/2022, 7:44 AM  Aliene Altes, PA-C [facility]    Addendum:  Reviewed CT angio imaging with Dr. Bethel Born with IR who stated CTA findings look stable, so nothing recommended acutely. He doesn't think the celiac is treatable given appearance. Stated it is possible the SMA is enough to be giving him some symptoms if the IMA stenosis  is more than it appears. Michela Pitcher they would be glad to talk to him in clinic about possible mesenteric arteriography and consideration of stenting of his SMA, if  it appears appropriate. Their office will be reaching out to patient to arrange follow-up.   Aliene Altes, PA-C [facility]

## 2022-01-23 ENCOUNTER — Inpatient Hospital Stay (HOSPITAL_COMMUNITY): Payer: No Typology Code available for payment source | Admitting: Anesthesiology

## 2022-01-23 ENCOUNTER — Encounter (HOSPITAL_COMMUNITY)
Admission: EM | Disposition: A | Discharge status: Home / Self Care | Payer: Self-pay | Source: Home / Self Care | Attending: Internal Medicine

## 2022-01-23 ENCOUNTER — Encounter (HOSPITAL_COMMUNITY): Payer: Self-pay | Admitting: Internal Medicine

## 2022-01-23 DIAGNOSIS — I5022 Chronic systolic (congestive) heart failure: Secondary | ICD-10-CM | POA: Diagnosis present

## 2022-01-23 DIAGNOSIS — I110 Hypertensive heart disease with heart failure: Secondary | ICD-10-CM | POA: Diagnosis present

## 2022-01-23 DIAGNOSIS — Z20822 Contact with and (suspected) exposure to covid-19: Secondary | ICD-10-CM | POA: Diagnosis present

## 2022-01-23 DIAGNOSIS — I700 Atherosclerosis of aorta: Secondary | ICD-10-CM | POA: Diagnosis present

## 2022-01-23 DIAGNOSIS — F1020 Alcohol dependence, uncomplicated: Secondary | ICD-10-CM | POA: Diagnosis present

## 2022-01-23 DIAGNOSIS — I252 Old myocardial infarction: Secondary | ICD-10-CM

## 2022-01-23 DIAGNOSIS — Z8249 Family history of ischemic heart disease and other diseases of the circulatory system: Secondary | ICD-10-CM | POA: Diagnosis not present

## 2022-01-23 DIAGNOSIS — E876 Hypokalemia: Secondary | ICD-10-CM | POA: Diagnosis present

## 2022-01-23 DIAGNOSIS — R1033 Periumbilical pain: Secondary | ICD-10-CM | POA: Diagnosis not present

## 2022-01-23 DIAGNOSIS — N179 Acute kidney failure, unspecified: Secondary | ICD-10-CM | POA: Diagnosis present

## 2022-01-23 DIAGNOSIS — K449 Diaphragmatic hernia without obstruction or gangrene: Secondary | ICD-10-CM | POA: Diagnosis present

## 2022-01-23 DIAGNOSIS — I10 Essential (primary) hypertension: Secondary | ICD-10-CM

## 2022-01-23 DIAGNOSIS — B182 Chronic viral hepatitis C: Secondary | ICD-10-CM | POA: Diagnosis present

## 2022-01-23 DIAGNOSIS — K5730 Diverticulosis of large intestine without perforation or abscess without bleeding: Secondary | ICD-10-CM

## 2022-01-23 DIAGNOSIS — D509 Iron deficiency anemia, unspecified: Secondary | ICD-10-CM | POA: Diagnosis present

## 2022-01-23 DIAGNOSIS — I255 Ischemic cardiomyopathy: Secondary | ICD-10-CM | POA: Diagnosis not present

## 2022-01-23 DIAGNOSIS — Z6823 Body mass index (BMI) 23.0-23.9, adult: Secondary | ICD-10-CM | POA: Diagnosis not present

## 2022-01-23 DIAGNOSIS — K551 Chronic vascular disorders of intestine: Secondary | ICD-10-CM | POA: Diagnosis present

## 2022-01-23 DIAGNOSIS — I248 Other forms of acute ischemic heart disease: Secondary | ICD-10-CM | POA: Diagnosis present

## 2022-01-23 DIAGNOSIS — K559 Vascular disorder of intestine, unspecified: Secondary | ICD-10-CM | POA: Diagnosis present

## 2022-01-23 DIAGNOSIS — E860 Dehydration: Secondary | ICD-10-CM | POA: Diagnosis present

## 2022-01-23 DIAGNOSIS — K219 Gastro-esophageal reflux disease without esophagitis: Secondary | ICD-10-CM | POA: Diagnosis present

## 2022-01-23 DIAGNOSIS — R627 Adult failure to thrive: Secondary | ICD-10-CM | POA: Diagnosis present

## 2022-01-23 DIAGNOSIS — R634 Abnormal weight loss: Secondary | ICD-10-CM | POA: Diagnosis present

## 2022-01-23 DIAGNOSIS — K648 Other hemorrhoids: Secondary | ICD-10-CM

## 2022-01-23 DIAGNOSIS — F1210 Cannabis abuse, uncomplicated: Secondary | ICD-10-CM | POA: Diagnosis present

## 2022-01-23 DIAGNOSIS — I951 Orthostatic hypotension: Secondary | ICD-10-CM | POA: Diagnosis present

## 2022-01-23 DIAGNOSIS — I429 Cardiomyopathy, unspecified: Secondary | ICD-10-CM | POA: Diagnosis present

## 2022-01-23 HISTORY — PX: COLONOSCOPY WITH PROPOFOL: SHX5780

## 2022-01-23 LAB — HEPATITIS B SURFACE ANTIGEN

## 2022-01-23 LAB — COMPREHENSIVE METABOLIC PANEL
ALT: 21 U/L (ref 0–44)
AST: 23 U/L (ref 15–41)
Albumin: 3.4 g/dL — ABNORMAL LOW (ref 3.5–5.0)
Alkaline Phosphatase: 43 U/L (ref 38–126)
Anion gap: 4 — ABNORMAL LOW (ref 5–15)
BUN: 5 mg/dL — ABNORMAL LOW (ref 8–23)
CO2: 23 mmol/L (ref 22–32)
Calcium: 8.2 mg/dL — ABNORMAL LOW (ref 8.9–10.3)
Chloride: 110 mmol/L (ref 98–111)
Creatinine, Ser: 0.91 mg/dL (ref 0.61–1.24)
GFR, Estimated: >60 mL/min (ref >60)
Glucose, Bld: 81 mg/dL (ref 70–99)
Potassium: 3.6 mmol/L (ref 3.5–5.1)
Sodium: 137 mmol/L (ref 135–145)
Total Bilirubin: 0.6 mg/dL (ref 0.3–1.2)
Total Protein: 7.1 g/dL (ref 6.5–8.1)

## 2022-01-23 LAB — MAGNESIUM: Magnesium: 1.4 mg/dL — ABNORMAL LOW (ref 1.7–2.4)

## 2022-01-23 SURGERY — COLONOSCOPY WITH PROPOFOL
Anesthesia: General

## 2022-01-23 MED ORDER — DAPAGLIFLOZIN PROPANEDIOL 10 MG PO TABS: 10.0000 mg | ORAL_TABLET | Freq: Every day | ORAL | 2 refills | Status: DC

## 2022-01-23 MED ORDER — ENSURE ENLIVE PO LIQD: 237.0000 mL | Freq: Two times a day (BID) | ORAL | Status: DC

## 2022-01-23 MED ORDER — VITAMIN B-1 100 MG PO TABS: 100.0000 mg | ORAL_TABLET | Freq: Every day | ORAL | 2 refills | Status: DC

## 2022-01-23 MED ORDER — LACTATED RINGERS IV SOLN: INTRAVENOUS | Status: DC

## 2022-01-23 MED ORDER — ROSUVASTATIN CALCIUM 10 MG PO TABS: 10.0000 mg | ORAL_TABLET | Freq: Every day | ORAL | 1 refills | Status: DC

## 2022-01-23 MED ORDER — CARVEDILOL 6.25 MG PO TABS: 6.2500 mg | ORAL_TABLET | Freq: Two times a day (BID) | ORAL | 1 refills | Status: DC

## 2022-01-23 MED ORDER — SACUBITRIL-VALSARTAN 24-26 MG PO TABS: 1.0000 | ORAL_TABLET | Freq: Two times a day (BID) | ORAL | 2 refills | Status: DC

## 2022-01-23 MED ORDER — HYDROCORTISONE ACETATE 25 MG RE SUPP: 25.0000 mg | Freq: Two times a day (BID) | RECTAL | 0 refills | Status: DC | PRN

## 2022-01-23 MED ORDER — PROPOFOL 10 MG/ML IV BOLUS
INTRAVENOUS | Status: DC | PRN
  Administered 2022-01-23: 60 mg via INTRAVENOUS

## 2022-01-23 MED ORDER — PROPOFOL 500 MG/50ML IV EMUL
INTRAVENOUS | Status: AC
  Filled 2022-01-23: qty 50

## 2022-01-23 MED ORDER — PROPOFOL 500 MG/50ML IV EMUL
INTRAVENOUS | Status: DC | PRN
  Administered 2022-01-23: 150 ug/kg/min via INTRAVENOUS

## 2022-01-23 MED ORDER — MAGNESIUM SULFATE 2 GM/50ML IV SOLN
2.0000 g | Freq: Once | INTRAVENOUS | Status: AC
  Administered 2022-01-23: 2 g via INTRAVENOUS
  Filled 2022-01-23: qty 50

## 2022-01-23 MED ORDER — OMEPRAZOLE 40 MG PO CPDR: 40.0000 mg | DELAYED_RELEASE_CAPSULE | Freq: Every day | ORAL | 3 refills | Status: DC

## 2022-01-23 MED ORDER — SODIUM CHLORIDE 0.9 % IV SOLN: INTRAVENOUS | Status: DC

## 2022-01-23 MED ORDER — MAGNESIUM OXIDE -MG SUPPLEMENT 400 (240 MG) MG PO TABS: 400.0000 mg | ORAL_TABLET | Freq: Two times a day (BID) | ORAL | 1 refills | Status: AC

## 2022-01-23 MED ORDER — SACUBITRIL-VALSARTAN 24-26 MG PO TABS: 1.0000 | ORAL_TABLET | Freq: Two times a day (BID) | ORAL | Status: DC

## 2022-01-23 NOTE — Op Note (Signed)
[facility] Patient Name: Jonathan Benitez Procedure Date: 01/23/2022 1:17 PM MRN: [id number] Date of Birth: 05/08/1951 Attending MD: Elon Alas. Abbey Chatters DO CSN: [id number] Age: 71 Admit Type: Outpatient Procedure:                Colonoscopy Indications:              Periumbilical abdominal pain, Abnormal CT of the GI                            tract Providers:                Elon Alas. Abbey Chatters, DO, Charlsie Quest. Theda Sers RN, RN,                            Sara Lee, [employer], Page Risa Grill, Technician Referring MD:              Medicines:                See the Anesthesia note for documentation of the                            administered medications Complications:            No immediate complications. Estimated Blood Loss:     Estimated blood loss: none. Procedure:                Pre-Anesthesia Assessment:                           - The anesthesia plan was to use monitored                            anesthesia care (MAC).                           After obtaining informed consent, the colonoscope                            was passed under direct vision. Throughout the                            procedure, the patient's blood pressure, pulse, and                            oxygen saturations were monitored continuously. The                            [id number]-HQ190L ([id number]) scope was introduced through                            the anus and advanced to the the cecum, identified                            by appendiceal orifice and ileocecal valve. The  colonoscopy was performed without difficulty. The                            patient tolerated the procedure well. The quality                            of the bowel preparation was evaluated using the                            BBPS [facility] Bowel Preparation Scale) with scores                            of: Right Colon = 3, Transverse Colon = 3 and Left                             Colon = 3 (entire mucosa seen well with no residual                            staining, small fragments of stool or opaque                            liquid). The total BBPS score equals 9. Scope In: 1:32:42 PM Scope Out: 1:42:10 PM Scope Withdrawal Time: 0 hours 7 minutes 39 seconds  Total Procedure Duration: 0 hours 9 minutes 28 seconds  Findings:      The perianal and digital rectal examinations were normal.      Non-bleeding internal hemorrhoids were found during endoscopy.      Multiple small and large-mouthed diverticula were found in the sigmoid       colon and descending colon.      Normal mucosa was found in the rectum and in the recto-sigmoid colon. Impression:               - Non-bleeding internal hemorrhoids.                           - Diverticulosis in the sigmoid colon and in the                            descending colon.                           - Normal mucosa in the rectum and in the                            recto-sigmoid colon.                           - No specimens collected. Moderate Sedation:      Per Anesthesia Care Recommendation:           - Return patient to hospital ward for ongoing care.                           - Resume regular diet.                           -  Colonoscopy largely unremarkable. Follow up with                            vascular. Okay to DC home from GI standpoint Procedure Code(s):        --- Professional ---                           [id number], Colonoscopy, flexible; diagnostic, including                            collection of specimen(s) by brushing or washing,                            when performed (separate procedure) Diagnosis Code(s):        --- Professional ---                           K64.8, Other hemorrhoids                           S92.33, Periumbilical pain                           K57.30, Diverticulosis of large intestine without                            perforation or abscess without  bleeding                           R93.3, Abnormal findings on diagnostic imaging of                            other parts of digestive tract CPT copyright 2019 American [employer] Association. All rights reserved. The codes documented in this report are preliminary and upon coder review may  be revised to meet current compliance requirements. Elon Alas. Abbey Chatters, DO [place] Abbey Chatters, DO 01/23/2022 1:45:27 PM This report has been signed electronically. Number of Addenda: 0

## 2022-01-23 NOTE — Anesthesia Postprocedure Evaluation (Signed)
Anesthesia Post Note  Patient: Jonathan Benitez  Procedure(s) Performed: COLONOSCOPY WITH PROPOFOL  Patient location during evaluation: Phase II Anesthesia Type: General Level of consciousness: awake and alert and oriented Pain management: pain level controlled Vital Signs Assessment: post-procedure vital signs reviewed and stable Respiratory status: spontaneous breathing, nonlabored ventilation and respiratory function stable Cardiovascular status: blood pressure returned to baseline and stable Postop Assessment: no apparent nausea or vomiting Anesthetic complications: no   No notable events documented.   Last Vitals:  Vitals:   01/23/22 1146 01/23/22 1350  BP: (!) 143/95 119/88  Pulse: 82 100  Resp: 13 17  Temp: 36.8 C 36.7 C  SpO2: 100% 100%    Last Pain:  Vitals:   01/23/22 1350  TempSrc:   PainSc: 6                  [name]

## 2022-01-23 NOTE — Assessment & Plan Note (Signed)
-   In the setting of decreased oral intake -Feeding supplements recommended. -adequate nutrition and hydration has been encouraged.

## 2022-01-23 NOTE — Progress Notes (Signed)
Patient drank 1/2 of Golytely bowel prep. Patient slept on and off this shift from need to got to bathroom.Tap enema x1 given bowl movement completely clear. Patient tolerated well.

## 2022-01-23 NOTE — Transfer of Care (Signed)
Immediate Anesthesia Transfer of Care Note  Patient: Jonathan Benitez  Procedure(s) Performed: COLONOSCOPY WITH PROPOFOL  Patient Location: PACU  Anesthesia Type:General  Level of Consciousness: awake, alert  and oriented  Airway & Oxygen Therapy: Patient Spontanous Breathing  Post-op Assessment: Report given to RN, Post -op Vital signs reviewed and stable, Patient moving all extremities X 4 and Patient able to stick tongue midline  Post vital signs: Reviewed  Last Vitals:  Vitals Value Taken Time  BP 119/88   Temp 98.0   Pulse 100   Resp 30   SpO2 100     Last Pain:  Vitals:   01/23/22 1327  TempSrc:   PainSc: 6       Patients Stated Pain Goal: 0 (82/95/62 1308)  Complications: No notable events documented.

## 2022-01-23 NOTE — Progress Notes (Signed)
Progress Note  Patient Name: Jonathan Benitez Date of Encounter: 01/23/2022  Primary Cardiologist: New to [facility]  Subjective   Patient currently n.p.o. awaiting colonoscopy this afternoon.  He does not report any active chest or abdominal pain.  Inpatient Medications    Scheduled Meds:  carvedilol  6.25 mg Oral BID WC   dapagliflozin propanediol  10 mg Oral Daily   hydrocortisone  25 mg Rectal BID   loratadine  10 mg Oral Daily   magnesium oxide  400 mg Oral BID   mirtazapine  15 mg Oral QHS   pantoprazole  40 mg Oral Daily   rosuvastatin  10 mg Oral Daily   thiamine  100 mg Oral Daily    PRN Meds: acetaminophen **OR** acetaminophen, fluticasone, ondansetron **OR** ondansetron (ZOFRAN) IV, mouth rinse   Vital Signs    Vitals:   01/21/22 1943 01/22/22 0513 01/22/22 1947 01/23/22 0929  BP: (!) 156/99 (!) 132/97 (!) 159/95 (!) 142/92  Pulse: 88 95 83 94  Resp:      Temp: 98.1 F (36.7 C) 98.4 F (36.9 C) 97.8 F (36.6 C)   TempSrc: Oral Oral Oral   SpO2: 100% 98% 100%   Weight:      Height:        Intake/Output Summary (Last 24 hours) at 01/23/2022 1111 Last data filed at 01/23/2022 0900 Gross per 24 hour  Intake 720 ml  Output --  Net 720 ml   Filed Weights   01/19/22 0751  Weight: 79.4 kg    Telemetry    Sinus rhythm.  Personally reviewed.  ECG    No ECG reviewed.  Physical Exam   GEN: No acute distress.  Neck: No JVD. Cardiac: RRR with 1/6 somewhat murmur and gallop.  Respiratory: Nonlabored. Clear to auscultation bilaterally. GI: Soft, nontender, bowel sounds present. MS: No edema. Neuro:  Nonfocal. Psych: Alert and oriented x 3. Normal affect.  Labs    Chemistry Recent Labs  Lab 01/19/22 0800 01/20/22 0505 01/21/22 0440 01/22/22 0351 01/23/22 0310  NA 138 139 138 138 137  K 2.7* 2.9* 3.8 3.2* 3.6  CL 99 106 107 107 110  CO2 '24 25 25 23 23  '$ GLUCOSE 114* 84 98 89 81  BUN 12 8 5* 6* 5*  CREATININE 2.88* 0.99 0.86  0.89 0.91  CALCIUM 8.6* 7.6* 7.8* 8.2* 8.2*  PROT 9.4* 6.7  --   --  7.1  ALBUMIN 4.5 3.3*  --   --  3.4*  AST 58* 37  --   --  23  ALT 42 28  --   --  21  ALKPHOS 65 46  --   --  43  BILITOT 1.4* 1.2  --   --  0.6  GFRNONAA 23* >60 >60 >60 >60  ANIONGAP '15 8 6 8 '$ 4*     Hematology Recent Labs  Lab 01/19/22 0800 01/20/22 0505  WBC 7.0 4.9  RBC 4.33 3.39*  HGB 13.9 11.0*  HCT 41.0 33.1*  MCV 94.7 97.6  MCH 32.1 32.4  MCHC 33.9 33.2  RDW 14.5 14.5  PLT 179 125*    Cardiac Enzymes Recent Labs  Lab 01/19/22 0800 01/19/22 1102  TROPONINIHS 74* 62*    DDimer Recent Labs  Lab 01/19/22 0800  DDIMER 0.42     Radiology    CT Angio Abd/Pel w/ and/or w/o  Result Date: 01/21/2022 CLINICAL DATA:  Decreased appetite, postprandial pain and weight loss. EXAM: CT ANGIOGRAPHY ABDOMEN AND  PELVIS WITH CONTRAST AND WITHOUT CONTRAST TECHNIQUE: Multidetector CT imaging of the abdomen and pelvis was performed using the standard protocol during bolus administration of intravenous contrast. Multiplanar reconstructed images and MIPs were obtained and reviewed to evaluate the vascular anatomy. RADIATION DOSE REDUCTION: This exam was performed according to the departmental dose-optimization program which includes automated exposure control, adjustment of the mA and/or kV according to patient size and/or use of iterative reconstruction technique. CONTRAST:  166m OMNIPAQUE IOHEXOL 350 MG/ML SOLN COMPARISON:  Prior CTA of the abdomen and pelvis on 01/03/2021. CT of the abdomen and pelvis without contrast on 01/19/2022. FINDINGS: VASCULAR Aorta: Stable atherosclerosis without evidence of aneurysm or dissection. Celiac: Stable heavily calcified plaque at the origin of the celiac axis causing high-grade stenosis/subtotal occlusive disease. Predominant flow likely coming from collateral flow via the SMA given prominent pancreaticoduodenal vessels. SMA: Heavily calcified plaque again noted at the origin and  proximal trunk of the SMA. This causes up to approximately 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 at the origin best seen on coronal imaging. Renals: Single renal arteries again noted bilaterally without significant stenosis. IMA: Calcified plaque again noted at origin which appears stable. IMA remains patent. Inflow: Stable atherosclerosis. No significant obstructive disease or aneurysmal disease. Proximal Outflow: Stable atherosclerosis of the common femoral arteries and femoral bifurcations without significant obstructive disease. Veins: Venous phase imaging demonstrates normally patent venous structures in the abdomen and pelvis. Review of the MIP images confirms the above findings. NON-VASCULAR Lower chest: No acute abnormality. Hepatobiliary: No focal liver abnormality is seen. No gallstones, gallbladder wall thickening, or biliary dilatation. Pancreas: Unremarkable. No pancreatic ductal dilatation or surrounding inflammatory changes. Spleen: Normal in size without focal abnormality. Adrenals/Urinary Tract: Adrenal glands are unremarkable. Kidneys are normal, without renal calculi, focal lesion, or hydronephrosis. Bladder is unremarkable. Stomach/Bowel: Diaphragmatic hiatus and gastric morphology proximally is consistent with a component a sliding hiatal hernia which appears relatively produced at the time of current imaging. Bowel shows no evidence of obstruction, ileus, inflammation or lesion. The appendix demonstrates small calcified appendicolith at the tip with no evidence of acute appendicitis. Stable diverticulosis of the sigmoid and descending colon without evidence of acute diverticulitis. No free intraperitoneal air. Lymphatic: No enlarged abdominal or pelvic lymph nodes. Reproductive: Prostate is unremarkable. Other: No abdominal wall hernia or abnormality. No abdominopelvic ascites. Musculoskeletal: No acute or significant osseous findings. IMPRESSION: 1. 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 2. Stable atherosclerosis involving proximal SMA and IMA without high-grade stenosis. Maximal narrowing of the proximal SMA again approaches 50% caliber. 3. Calcified appendicolith at the tip of the appendix without evidence acute appendicitis. 4. Diverticulosis of the descending and sigmoid colon without evidence of diverticulitis. Electronically Signed   By: GAletta EdouardM.D.   On: 01/21/2022 15:48    Assessment & Plan    1.  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  Also with regular alcohol intake which could be a contributor to LV dysfunction as well.  Clinically stable at this time and undergoing initiation of basic GDMT.  Has been started on Coreg and FIran  Blood pressure trending up, he initially presented hypotensive and orthostatic in the setting of intravascular volume depletion.  Renal function normal at this point.  2.  Colonic diverticulosis and rectal wall thickening by CT suggesting mild proctitis.  N.p.o. for colonoscopy today per GI service.  3.  Recurrent abdominal pain.  Also has evidence of intestinal ischemia by CTA of the abdomen, findings described as stable.  Per GI note patient will most likely be seen by interventional radiology as an outpatient for further work-up.  4.  Acute renal insufficiency, creatinine 2.88 at presentation, now normalized with IV fluids.  Continue current doses of Coreg and [place].  Starting Entresto 24/26 mg twice daily.  This will likely be his outpatient discharge regimen.  Further adjustments can be made with office follow-up which we will set up.  Signed, Rozann Lesches, MD  01/23/2022, 11:11 AM

## 2022-01-23 NOTE — Interval H&P Note (Signed)
History and Physical Interval Note:  01/23/2022 12:31 PM  Jonathan Benitez  has presented today for surgery, with the diagnosis of proctitis, chagne in bowel habits, lower abdominal pain, weight loss.  The various methods of treatment have been discussed with the patient and family. After consideration of risks, benefits and other options for treatment, the patient has consented to  Procedure(s): COLONOSCOPY WITH PROPOFOL (N/A) as a surgical intervention.  The patient's history has been reviewed, patient examined, no change in status, stable for surgery.  I have reviewed the patient's chart and labs.  Questions were answered to the patient's satisfaction.     Eloise Harman

## 2022-01-23 NOTE — Anesthesia Preprocedure Evaluation (Addendum)
Anesthesia Evaluation  Patient identified by MRN, date of birth, ID band Patient awake    Reviewed: Allergy & Precautions, NPO status , Patient's Chart, lab work & pertinent test results, reviewed documented beta blocker date and time   History of Anesthesia Complications Negative for: history of anesthetic complications  Airway Mallampati: II  TM Distance: >3 FB Neck ROM: Full    Dental  (+) Dental Advisory Given, Missing, Chipped,    Pulmonary neg pulmonary ROS,    Pulmonary exam normal breath sounds clear to auscultation       Cardiovascular Exercise Tolerance: Good hypertension, Pt. on medications and Pt. on home beta blockers + Past MI (Elevated troponins on admission, no cardiology intervention now) and +CHF (cardiomyopathy)  Normal cardiovascular exam Rhythm:Regular Rate:Normal  19-Jan-2022 07:51:23 [facility] ROUTINE RECORD Aug 10, 1950 (76 yr) Male Black Vent. rate 119 BPM PR interval 150 ms QRS duration 93 ms QT/QTcB 327/461 ms P-R-T axes 62 -34 158 Sinus tachycardia Left axis deviation Nonspecific T abnormalities, lateral leads  Assessment & Plan   1.  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  Also with regular alcohol intake which could be a contributor to LV dysfunction as well.  Clinically stable at this time and undergoing initiation of basic GDMT.  Has been started on Coreg and [place].  Blood pressure trending up, he initially presented hypotensive and orthostatic in the setting of intravascular volume depletion.  Renal function normal at this point.  2.  Colonic diverticulosis and rectal wall thickening by CT suggesting mild proctitis.  N.p.o. for colonoscopy today per GI service.  3.  Recurrent abdominal pain.  Also has evidence of intestinal ischemia by CTA of the abdomen, findings described as stable.  Per GI note patient  will most likely be seen by interventional radiology as an outpatient for further work-up.  4.  Acute renal insufficiency, creatinine 2.88 at presentation, now normalized with IV fluids.  Continue current doses of Coreg and [place].  Starting Entresto 24/26 mg twice daily.  This will likely be his outpatient discharge regimen.  Further adjustments can be made with office follow-up which we will set up.  Signed, Jonathan Lesches, MD  01/23/2022, 11:11 AM           Neuro/Psych negative neurological ROS  negative psych ROS   GI/Hepatic Bowel prep,GERD  Medicated and Controlled,(+)     substance abuse  alcohol use and marijuana use, Hepatitis -, CCrohn's disease   Endo/Other  neg diabetes  Renal/GU Renal disease (AKI) Bladder dysfunction (prostate cancer)      Musculoskeletal  (+) Arthritis , Osteoarthritis,    Abdominal   Peds negative pediatric ROS (+)  Hematology  (+) Blood dyscrasia, anemia ,   Anesthesia Other Findings 1. Left ventricular ejection fraction, by estimation, is 45 to 50%. The  left ventricle has mildly decreased function. The left ventricle  demonstrates regional wall motion abnormalities (see scoring  diagram/findings for description). There is mild  asymmetric left ventricular hypertrophy of the basal-septal segment. There  is hypokinesis of the mid-to-apical lateral LV segments, all apical  segments, and apex. Indeterminate diastolic filling due to E-A fusion.  2. Right ventricular systolic function is normal. The right ventricular  size is normal.  3. Left atrial size was mild to moderately dilated.  4. The mitral valve is abnormal. Trivial mitral valve regurgitation.  Moderate to severe mitral annular calcification.  5. The aortic valve is tricuspid. There is mild  calcification of the  aortic valve. There is mild thickening of the aortic valve. Aortic valve  regurgitation is not visualized. Aortic valve sclerosis/calcification is   present, without any evidence of  aortic stenosis.  6. Aortic dilatation noted. There is borderline dilatation of the aortic  root, measuring 37 mm.  7. The inferior vena cava is normal in size with greater than 50%  respiratory variability, suggesting right atrial pressure of 3 mmHg.   Reproductive/Obstetrics negative OB ROS                           Anesthesia Physical Anesthesia Plan  ASA: 3  Anesthesia Plan: General   Post-op Pain Management: Minimal or no pain anticipated   Induction: Intravenous  PONV Risk Score and Plan: Propofol infusion  Airway Management Planned: Nasal Cannula and Natural Airway  Additional Equipment:   Intra-op Plan:   Post-operative Plan:   Informed Consent: I have reviewed the patients History and Physical, chart, labs and discussed the procedure including the risks, benefits and alternatives for the proposed anesthesia with the patient or authorized representative who has indicated his/her understanding and acceptance.     Dental advisory given  Plan Discussed with: CRNA and Surgeon  Anesthesia Plan Comments:        Anesthesia Quick Evaluation

## 2022-01-23 NOTE — Discharge Summary (Signed)
Physician Discharge Summary   Patient: Jonathan Benitez MRN: [id number] DOB: 1950-05-30  Admit date:     01/19/2022  Discharge date: 01/23/22  Discharge Physician: Barton Dubois   PCP: Lindell Spar, MD   Recommendations at discharge:  Please arrange follow-up with vascular surgery as an outpatient for further management/definitive treatment of proximal SMA and IMA stenosis. Repeat basic metabolic panel to follow electrolytes and renal function Repeat CBC to follow hemoglobin trend and stability. Reassess blood pressure and adjust antihypertensive regimen as needed. Continue assisting alcohol cessation. Reassess blood pressure and adjust antihypertensive regimen as needed.  Discharge Diagnoses: Principal Problem:   AKI (acute kidney injury) ([place]) Active Problems:   HTN (hypertension)   Alcoholism ([place])   Failure to thrive in adult   Marijuana abuse   Hypokalemia   Elevated troponin   Abdominal pain   Transaminasemia   Hypomagnesemia   Cardiomyopathy ([place])   Proctitis   Loss of weight   Change in bowel habits   Acute renal failure (ARF) ([place])  Resolved Problems:   * No resolved hospital problems. *  Hospital Course: 71 year old male with a history of hypertension, prostate cancer status post radiotherapy, GERD, and gouty arthritis presenting with 2-week history of generalized weakness, dyspnea on exertion, and dizziness.  The patient states that he has had decreased oral intake for the past month.  His anorexia has worsened in the last 2 weeks.  He states that every time he eats something solid he has some loose stools.  He denies any hematochezia or melena, but he states that he has some upper abdominal pain right greater than left upper quadrant.  He denies any nausea, vomiting, fevers, chills.  He has some occasional dysuria.  In the last 4 days, his dyspnea exertion and generalized weakness have worsened.  As result, he presented for further evaluation and treatment. In  particular, the patient denies any headache, visual disturbance, focal extremity weakness, chest pain, palpitations, coughing, hemoptysis.  He has not been started on any new medications.  He states that he smokes marijuana on a daily basis.  He denies any illicit drug use.  He states that he drinks a beer or 2 approximately 2 to 3 days/week. In the emergency department, the patient was afebrile and hemodynamically stable.  He was tachycardic in the 110s.  Blood pressure was soft in the 100s.  Oxygen saturation was 100% room air.  WBC 7.0, hemoglobin 13.9, platelets 179,000.  AST 58, ALT 42, alk phosphatase 65, total bilirubin 1.4.  BMP showed a sodium 138, potassium 2.7, bicarbonate 24, BUN 12, creatinine 2.88.  COVID PCR was negative.  Chest x-ray was negative.  EKG shows sinus tachycardia with T wave inversions V4-V5, 1 and 3.  Troponin was 74.  D-dimer 0.42.  Patient was given 2 L LR and 50 milli equivalents of KCl in the emergency department.  Overall, the patient clinically improved.  Work-up including echocardiogram showed EF 45 to 50%.  Cardiology was consulted for the patient's new cardiomyopathy.  He remained clinically euvolemic.  For his abdominal pain, CT abdomen was obtained and showed proctitis.  He was felt to have mesenteric ischemia.  GI was consulted.  Assessment and Plan: *Acute renal failure (HCC) Baseline creatinine 0.8-1.0 Presented with serum creatinine 2.8 Secondary to volume depletion Improved/resolved after fluid resuscitation. -Repeat basic metabolic panel follow-up visit to assess stability.  Loss of weight - In the setting of decreased oral intake -Feeding supplements recommended. -adequate nutrition and hydration has been  encouraged.  Proctitis -Appreciate GI service assistance and recommendation -Unremarkable colonoscopy -Continue as needed Anusol -Outpatient follow-up with vascular surgery.  Cardiomyopathy [facility]) -Newly diagnosed this admission -01/20/22  echo--EF 45-50% -Cardiology consult appreciated -Suspect maybe partly related to [place] -Patient advised to follow low-sodium diet, to check his weight on daily basis and to maintain adequate hydration -Started on carvedilol, Farxiga and Entresto. -Outpatient follow-up will be arranged with cardiology service.  Hypomagnesemia -Repleted and patient started on daily magnesium supplementation at discharge. -Continue to follow electrolytes trend.  Transaminasemia -Likely due to alcohol use -CT abdomen pelvis--proctitis; diverticulosis -Hep B surface antigen -Hep C RNA--neg -Continue to follow LFTs trend. -Alcohol cessation counseling provided.  Abdominal pain -Due to mesenteric ischemia -CT abdomen and pelvis>>proctitis, diverticulosis -Continue the use of Anusol twice a day as needed as per GI service recommendation. -Colonoscopy mostly unremarkable -Outpatient follow-up with vascular surgery for SMA and AMA atherosclerosis process.   -Patient has been advised to maintain adequate hydration.  Elevated troponin -Demand ischemia most likely. -No chest pain presently -EKG--sinus rhythm, nonspecific T wave changes -Echo--EF 45-50%, +WMA, trivial MR/TR -Cardiology consulted and will follow patient after discharge. -Patient expressed no shortness of breath.  Hypokalemia -Repleted and within normal limits -Recommended repeat basic metabolic panel at follow-up visit to assess electrolytes trend/stability.  Marijuana abuse -Cessation discussed -Patient likely has a component of cannabis hyperemesis syndrome.  Failure to thrive in adult -EVO--3.500 -X38--182 -Folic XHBZ--16.9 -Urinalysis--no significant pyuria -Most likely due to mesenteric ischemia -Small multiple meals and feeding supplements recommended -Outpatient follow-up with vascular surgery.  Alcoholism ([place]) -Patient continues to drink beers 2 to 3 days/week -No signs of withdrawal -Alcohol cessation  provided -Continue thiamine/folic acid supplementation  HTN (hypertension) -Holding lisinopril in the setting of Entresto initiation. -Continue carvedilol at current dose -Heart healthy diet encouraged.  Consultants: GI service and cardiology. Procedures performed: See below for x-ray report; colonoscopy essentially unremarkable. Disposition: Home Diet recommendation: Heart healthy diet.   DISCHARGE MEDICATION: Allergies as of 01/23/2022       Reactions   Nsaids Other (See Comments)    Anemia, Gastrointestinal hemorrhage        Medication List     STOP taking these medications    allopurinol 100 MG tablet Commonly known as: ZYLOPRIM   colchicine 0.6 MG tablet   cyclobenzaprine 5 MG tablet Commonly known as: FLEXERIL   diclofenac Sodium 1 % Gel Commonly known as: Voltaren   lisinopril 20 MG tablet Commonly known as: ZESTRIL   polyethylene glycol-electrolytes 420 g solution Commonly known as: TriLyte   potassium chloride SA 20 MEQ tablet Commonly known as: KLOR-CON M   predniSONE 10 MG tablet Commonly known as: DELTASONE   TRAZODONE HCL PO       TAKE these medications    carvedilol 6.25 MG tablet Commonly known as: COREG Take 1 tablet (6.25 mg total) by mouth 2 (two) times daily with a meal.   cetirizine 10 MG tablet Commonly known as: ZYRTEC Take 10 mg by mouth daily.   dapagliflozin propanediol 10 MG Tabs tablet Commonly known as: FARXIGA Take 1 tablet (10 mg total) by mouth daily. Start taking on: January 24, 2022   feeding supplement Liqd Take 237 mLs by mouth 2 (two) times daily between meals. Start taking on: January 24, 2022   fluticasone 50 MCG/ACT nasal spray Commonly known as: FLONASE Place 2 sprays into both nostrils daily as needed for allergies.   hydrocortisone 25 MG suppository Commonly known as: ANUSOL-HC Place 1 suppository (  25 mg total) rectally 2 (two) times daily as needed for hemorrhoids or anal itching.    magnesium oxide 400 (240 Mg) MG tablet Commonly known as: MAG-OX Take 1 tablet (400 mg total) by mouth 2 (two) times daily.   mirtazapine 15 MG tablet Commonly known as: REMERON Take 1 tablet (15 mg total) by mouth at bedtime.   omeprazole 40 MG capsule Commonly known as: PRILOSEC Take 1 capsule (40 mg total) by mouth daily.   rosuvastatin 10 MG tablet Commonly known as: CRESTOR Take 1 tablet (10 mg total) by mouth daily. Start taking on: January 24, 2022   sacubitril-valsartan 24-26 MG Commonly known as: ENTRESTO Take 1 tablet by mouth 2 (two) times daily.   thiamine 100 MG tablet Commonly known as: Vitamin B-1 Take 1 tablet (100 mg total) by mouth daily. Start taking on: January 24, 2022        Follow-up Information     Lindell Spar, MD. Schedule an appointment as soon as possible for a visit in 10 day(s).   Specialty: Internal Medicine Contact information: [address] [place] [address] [phone]                Discharge Exam: Danley Danker Weights   01/19/22 0751  Weight: 79.4 kg   General exam: Alert, awake, oriented x 3; in no acute distress; denies shortness of breath, nausea, vomiting and chest pain. Respiratory system: Clear to auscultation. Respiratory effort normal.  Good saturation on room air.  No using accessory muscle. Cardiovascular system: Rate controlled, no rubs, no gallops, no JVD. Gastrointestinal system: Abdomen is nondistended, soft and nontender. No organomegaly or masses felt. Normal bowel sounds heard. Central nervous system: Alert and oriented. No focal neurological deficits. Extremities: No cyanosis or clubbing. Skin: No rashes, lesions or ulcers Psychiatry: Judgement and insight appear normal. Mood & affect appropriate.    Condition at discharge: Stable and improved.  The results of significant diagnostics from this hospitalization (including imaging, microbiology, ancillary and laboratory) are listed below for  reference.   Imaging Studies: CT Angio Abd/Pel w/ and/or w/o  Result Date: 01/21/2022 CLINICAL DATA:  Decreased appetite, postprandial pain and weight loss. EXAM: CT ANGIOGRAPHY ABDOMEN AND PELVIS WITH CONTRAST AND WITHOUT CONTRAST TECHNIQUE: Multidetector CT imaging of the abdomen and pelvis was performed using the standard protocol during bolus administration of intravenous contrast. Multiplanar reconstructed images and MIPs were obtained and reviewed to evaluate the vascular anatomy. RADIATION DOSE REDUCTION: This exam was performed according to the departmental dose-optimization program which includes automated exposure control, adjustment of the mA and/or kV according to patient size and/or use of iterative reconstruction technique. CONTRAST:  111mL OMNIPAQUE IOHEXOL 350 MG/ML SOLN COMPARISON:  Prior CTA of the abdomen and pelvis on 01/03/2021. CT of the abdomen and pelvis without contrast on 01/19/2022. FINDINGS: VASCULAR Aorta: Stable atherosclerosis without evidence of aneurysm or dissection. Celiac: Stable heavily calcified plaque at the origin of the celiac axis causing high-grade stenosis/subtotal occlusive disease. Predominant flow likely coming from collateral flow via the SMA given prominent pancreaticoduodenal vessels. SMA: Heavily calcified plaque again noted at the origin and proximal trunk of the SMA. This causes up to approximately 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 at the origin best seen on coronal imaging. Renals: Single renal arteries again noted bilaterally without significant stenosis. IMA: Calcified plaque again noted at origin which appears stable. IMA remains patent. Inflow: Stable atherosclerosis. No significant obstructive disease or aneurysmal disease. Proximal Outflow: Stable atherosclerosis of the common femoral arteries and femoral bifurcations  without significant obstructive disease. Veins: Venous phase imaging demonstrates normally patent venous structures in the abdomen and pelvis. Review of the  MIP images confirms the above findings. NON-VASCULAR Lower chest: No acute abnormality. Hepatobiliary: No focal liver abnormality is seen. No gallstones, gallbladder wall thickening, or biliary dilatation. Pancreas: Unremarkable. No pancreatic ductal dilatation or surrounding inflammatory changes. Spleen: Normal in size without focal abnormality. Adrenals/Urinary Tract: Adrenal glands are unremarkable. Kidneys are normal, without renal calculi, focal lesion, or hydronephrosis. Bladder is unremarkable. Stomach/Bowel: Diaphragmatic hiatus and gastric morphology proximally is consistent with a component a sliding hiatal hernia which appears relatively produced at the time of current imaging. Bowel shows no evidence of obstruction, ileus, inflammation or lesion. The appendix demonstrates small calcified appendicolith at the tip with no evidence of acute appendicitis. Stable diverticulosis of the sigmoid and descending colon without evidence of acute diverticulitis. No free intraperitoneal air. Lymphatic: No enlarged abdominal or pelvic lymph nodes. Reproductive: Prostate is unremarkable. Other: No abdominal wall hernia or abnormality. No abdominopelvic ascites. Musculoskeletal: No acute or significant osseous findings. IMPRESSION: 1. 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 2. Stable atherosclerosis involving proximal SMA and IMA without high-grade stenosis. Maximal narrowing of the proximal SMA again approaches 50% caliber. 3. Calcified appendicolith at the tip of the appendix without evidence acute appendicitis. 4. Diverticulosis of the descending and sigmoid colon without evidence of diverticulitis. Electronically Signed   By: Aletta Edouard M.D.   On: 01/21/2022 15:48   ECHOCARDIOGRAM COMPLETE  Result Date: 01/20/2022    ECHOCARDIOGRAM REPORT   Patient Name:   Jonathan Benitez Date of Exam: 01/20/2022 Medical Rec  #:  [id number]        Height:       72.0 in Accession #:    [id number]       Weight:       175.0 lb Date of Birth:  May 09, 1951        BSA:          2.013 m Patient Age:    48 years         BP:           163/91 mmHg Patient Gender: M                HR:           85 bpm. Exam Location:  [facility] Procedure: 2D Echo, Cardiac Doppler and Color Doppler Indications:    Dyspnea R06.00  History:        Patient has no prior history of Echocardiogram examinations.                 Risk Factors:Hypertension. Hx of Alcoholism, Marijuana abuse,                 Prostate cancer.  Sonographer:    Alvino Chapel RCS Referring Phys: [phone] DAVID TAT IMPRESSIONS  1. Left ventricular ejection fraction, by estimation, is 45 to 50%. The left ventricle has mildly decreased function. The left ventricle demonstrates regional wall motion abnormalities (see scoring diagram/findings for description). There is mild asymmetric left ventricular hypertrophy of the basal-septal segment. There is hypokinesis of the mid-to-apical lateral LV segments, all apical segments, and apex. Indeterminate diastolic filling due to E-A fusion.  2. Right ventricular systolic function is normal. The right ventricular size is normal.  3. Left atrial size was mild to moderately dilated.  4. The mitral valve  is abnormal. Trivial mitral valve regurgitation. Moderate to severe mitral annular calcification.  5. The aortic valve is tricuspid. There is mild calcification of the aortic valve. There is mild thickening of the aortic valve. Aortic valve regurgitation is not visualized. Aortic valve sclerosis/calcification is present, without any evidence of aortic stenosis.  6. Aortic dilatation noted. There is borderline dilatation of the aortic root, measuring 37 mm.  7. The inferior vena cava is normal in size with greater than 50% respiratory variability, suggesting right atrial pressure of 3 mmHg. FINDINGS  Left Ventricle: Left ventricular ejection fraction, by estimation, is  45 to 50%. The left ventricle has mildly decreased function. The left ventricle demonstrates regional wall motion abnormalities. The left ventricular internal cavity size was normal in size. There is mild asymmetric left ventricular hypertrophy of the basal-septal segment. There is hypokinesis of the mid-to-apical lateral LV segments, all apical segments and apex. Indeterminate diastolic filling due to E-A fusion. Right Ventricle: The right ventricular size is normal. No increase in right ventricular wall thickness. Right ventricular systolic function is normal. Left Atrium: Left atrial size was mild to moderately dilated. Right Atrium: Right atrial size was normal in size. Pericardium: There is no evidence of pericardial effusion. Mitral Valve: The mitral valve is abnormal. There is mild thickening of the mitral valve leaflet(s). There is mild calcification of the mitral valve leaflet(s). Moderate to severe mitral annular calcification. Trivial mitral valve regurgitation. Tricuspid Valve: The tricuspid valve is normal in structure. Tricuspid valve regurgitation is trivial. Aortic Valve: The aortic valve is tricuspid. There is mild calcification of the aortic valve. There is mild thickening of the aortic valve. Aortic valve regurgitation is not visualized. Aortic valve sclerosis/calcification is present, without any evidence of aortic stenosis. Pulmonic Valve: The pulmonic valve was normal in structure. Pulmonic valve regurgitation is trivial. Aorta: Aortic dilatation noted. There is borderline dilatation of the aortic root, measuring 37 mm. Venous: The inferior vena cava is normal in size with greater than 50% respiratory variability, suggesting right atrial pressure of 3 mmHg. IAS/Shunts: The atrial septum is grossly normal.  LEFT VENTRICLE PLAX 2D LVIDd:         4.40 cm   Diastology LVIDs:         2.40 cm   LV e' lateral:   9.36 cm/s LV PW:         1.00 cm   LV E/e' lateral: 12.2 LV IVS:        1.00 cm LVOT diam:      2.20 cm LV SV:         62 LV SV Index:   31 LVOT Area:     3.80 cm  RIGHT VENTRICLE RV S prime:     12.90 cm/s TAPSE (M-mode): 2.3 cm LEFT ATRIUM             Index        RIGHT ATRIUM           Index LA diam:        3.20 cm 1.59 cm/m   RA Area:     15.30 cm LA Vol (A2C):   82.5 ml 40.98 ml/m  RA Volume:   39.90 ml  19.82 ml/m LA Vol (A4C):   59.4 ml 29.51 ml/m LA Biplane Vol: 75.3 ml 37.41 ml/m  AORTIC VALVE LVOT Vmax:   99.00 cm/s LVOT Vmean:  65.000 cm/s LVOT VTI:    0.162 m  AORTA Ao Root diam: 3.70 cm MITRAL VALVE MV  Area (PHT): 5.38 cm     SHUNTS MV Decel Time: 141 msec     Systemic VTI:  0.16 m MV E velocity: 114.00 cm/s  Systemic Diam: 2.20 cm Gwyndolyn Kaufman MD Electronically signed by Gwyndolyn Kaufman MD Signature Date/Time: 01/20/2022/1:24:16 PM    Final    CT ABDOMEN PELVIS WO CONTRAST  Result Date: 01/19/2022 CLINICAL DATA:  Abdominal pain for 2 weeks. Anorexia. Weight loss. Personal history of prostate carcinoma. EXAM: CT ABDOMEN AND PELVIS WITHOUT CONTRAST TECHNIQUE: Multidetector CT imaging of the abdomen and pelvis was performed following the standard protocol without IV contrast. RADIATION DOSE REDUCTION: This exam was performed according to the departmental dose-optimization program which includes automated exposure control, adjustment of the mA and/or kV according to patient size and/or use of iterative reconstruction technique. COMPARISON:  01/03/2021 FINDINGS: Lower chest: No acute findings. Hepatobiliary: No mass visualized on this unenhanced exam. Gallbladder is unremarkable. No evidence of biliary ductal dilatation. Pancreas: No mass or inflammatory process visualized on this unenhanced exam. Spleen:  Within normal limits in size. Adrenals/Urinary tract: No evidence of urolithiasis or hydronephrosis. Stomach/Bowel: New small to moderate hiatal hernia is seen. Normal appendix visualized. Mild left colonic diverticulosis is again seen, without evidence of diverticulitis. Mild  diffuse rectal wall thickening is noted, suspicious for mild proctitis. Vascular/Lymphatic: No pathologically enlarged lymph nodes identified. No evidence of abdominal aortic aneurysm. Aortic atherosclerotic calcification incidentally noted. Reproductive:  No mass or other significant abnormality. Other:  None. Musculoskeletal: No suspicious bone lesions identified. Left hip prosthesis noted. IMPRESSION: Mild diffuse rectal wall thickening, suspicious for proctitis. Colonic diverticulosis, without radiographic evidence of diverticulitis. New small to moderate hiatal hernia. Electronically Signed   By: Marlaine Hind M.D.   On: 01/19/2022 20:07   DG Chest Port 1 View  Result Date: 01/19/2022 CLINICAL DATA:  Shortness of breath. EXAM: PORTABLE CHEST 1 VIEW COMPARISON:  04/26/2020 FINDINGS: 0849 hours. The lungs are clear without focal pneumonia, edema, pneumothorax or pleural effusion. The cardiopericardial silhouette is within normal limits for size. Telemetry leads overlie the chest. IMPRESSION: No active disease. Electronically Signed   By: Misty Stanley M.D.   On: 01/19/2022 08:58    Microbiology: Results for orders placed or performed during the hospital encounter of 01/19/22  Resp Panel by RT-PCR (Flu A&B, Covid) Anterior Nasal Swab     Status: None   Collection Time: 01/19/22  8:18 AM   Specimen: Anterior Nasal Swab  Result Value Ref Range Status   SARS Coronavirus 2 by RT PCR NEGATIVE NEGATIVE Final    Comment: (NOTE) SARS-CoV-2 target nucleic acids are NOT DETECTED.  The SARS-CoV-2 RNA is generally detectable in upper respiratory specimens during the acute phase of infection. The lowest concentration of SARS-CoV-2 viral copies this assay can detect is 138 copies/mL. A negative result does not preclude SARS-Cov-2 infection and should not be used as the sole basis for treatment or other patient management decisions. A negative result may occur with  improper specimen collection/handling,  submission of specimen other than nasopharyngeal swab, presence of viral mutation(s) within the areas targeted by this assay, and inadequate number of viral copies(<138 copies/mL). A negative result must be combined with clinical observations, patient history, and epidemiological information. The expected result is Negative.  Fact Sheet for Patients:  EntrepreneurPulse.com.au  Fact Sheet for Healthcare Providers:  IncredibleEmployment.be  This test is no t yet approved or cleared by the [place] FDA and  has been authorized for detection and/or diagnosis of SARS-CoV-2 by FDA  under an Emergency Use Authorization (EUA). This EUA will remain  in effect (meaning this test can be used) for the duration of the COVID-19 declaration under Section 564(b)(1) of the Act, 21 U.S.C.section 360bbb-3(b)(1), unless the authorization is terminated  or revoked sooner.       Influenza A by PCR NEGATIVE NEGATIVE Final   Influenza B by PCR NEGATIVE NEGATIVE Final    Comment: (NOTE) The Xpert Xpress SARS-CoV-2/FLU/RSV plus assay is intended as an aid in the diagnosis of influenza from Nasopharyngeal swab specimens and should not be used as a sole basis for treatment. Nasal washings and aspirates are unacceptable for Xpert Xpress SARS-CoV-2/FLU/RSV testing.  Fact Sheet for Patients: EntrepreneurPulse.com.au  Fact Sheet for Healthcare Providers: IncredibleEmployment.be  This test is not yet approved or cleared by the [place] FDA and has been authorized for detection and/or diagnosis of SARS-CoV-2 by FDA under an Emergency Use Authorization (EUA). This EUA will remain in effect (meaning this test can be used) for the duration of the COVID-19 declaration under Section 564(b)(1) of the Act, 21 U.S.C. section 360bbb-3(b)(1), unless the authorization is terminated or revoked.  Performed at [facility], [address] [place], [place] [address]   Urine Culture     Status: Abnormal   Collection Time: 01/19/22 12:07 PM   Specimen: Urine, Clean Catch  Result Value Ref Range Status   Specimen Description   Final    URINE, CLEAN CATCH Performed at [facility], [address], [place], [place] [address]    Special Requests   Final    NONE Performed at [facility], [address] [place], [place] [address]    Culture MULTIPLE SPECIES PRESENT, SUGGEST RECOLLECTION (A)  Final   Report Status 01/21/2022 FINAL  Final    Labs: CBC: Recent Labs  Lab 01/19/22 0800 01/20/22 0505  WBC 7.0 4.9  NEUTROABS 4.3  --   HGB 13.9 11.0*  HCT 41.0 33.1*  MCV 94.7 97.6  PLT 179 347*   Basic Metabolic Panel: Recent Labs  Lab 01/19/22 0800 01/20/22 0505 01/21/22 0440 01/22/22 0351 01/23/22 0310  NA 138 139 138 138 137  K 2.7* 2.9* 3.8 3.2* 3.6  CL 99 106 107 107 110  CO2 _0 GLUCOSE 114* 84 98 89 81  BUN 12 8 5* 6* 5*  CREATININE 2.88* 0.99 0.86 0.89 0.91  CALCIUM 8.6* 7.6* 7.8* 8.2* 8.2*  MG 1.0* 1.2* 1.4* 1.6* 1.4*   Liver Function Tests: Recent Labs  Lab 01/19/22 0800 01/20/22 0505 01/23/22 0310  AST 58* 37 23  ALT 42 28 21  ALKPHOS 65 46 43  BILITOT 1.4* 1.2 0.6  PROT 9.4* 6.7 7.1  ALBUMIN 4.5 3.3* 3.4*   CBG: No results for input(s): "GLUCAP" in the last 168 hours.  Discharge time spent: greater than 30 minutes.  Signed: Barton Dubois, MD [facility] 01/23/2022

## 2022-01-24 ENCOUNTER — Telehealth: Payer: Self-pay | Admitting: *Deleted

## 2022-01-24 NOTE — Telephone Encounter (Signed)
Transition Care Management Follow-up Telephone Call Date of discharge and from where: 01-23-22 [facility] How have you been since you were released from the hospital? Doing ok Any questions or concerns? No  Items Reviewed: Did the pt receive and understand the discharge instructions provided? No  Medications obtained and verified? Yes  Other? No  Any new allergies since your discharge? No  Dietary orders reviewed? No Do you have support at home? Yes   Home Care and Equipment/Supplies: Were home health services ordered? not applicable If so, what is the name of the agency? NA  Has the agency set up a time to come to the patient's home? not applicable Were any new equipment or medical supplies ordered?  No What is the name of the medical supply agency? na Were you able to get the supplies/equipment? not applicable Do you have any questions related to the use of the equipment or supplies? No  Functional Questionnaire: (I = Independent and D = Dependent) ADLs: I  Bathing/Dressing- I  Meal Prep- I  Eating- I  Maintaining continence- I  Transferring/Ambulation- I  Managing Meds- I  Follow up appointments reviewed:  PCP Hospital f/u appt confirmed? Yes  Scheduled to see 01-28-22 on [place] @ 8:00. [facility] f/u appt confirmed? No   Are transportation arrangements needed? No  If their condition worsens, is the pt aware to call PCP or go to the Emergency Dept.? Yes Was the patient provided with contact information for the PCP's office or ED? Yes Was to pt encouraged to call back with questions or concerns? Yes

## 2022-01-25 ENCOUNTER — Other Ambulatory Visit: Payer: Self-pay | Admitting: Gastroenterology

## 2022-01-25 DIAGNOSIS — K551 Chronic vascular disorders of intestine: Secondary | ICD-10-CM

## 2022-01-28 ENCOUNTER — Inpatient Hospital Stay: Payer: No Typology Code available for payment source | Admitting: Internal Medicine

## 2022-01-29 ENCOUNTER — Encounter (HOSPITAL_COMMUNITY): Payer: Self-pay | Admitting: Internal Medicine

## 2022-01-30 ENCOUNTER — Encounter: Payer: Self-pay | Admitting: Internal Medicine

## 2022-01-30 ENCOUNTER — Ambulatory Visit (INDEPENDENT_AMBULATORY_CARE_PROVIDER_SITE_OTHER): Payer: No Typology Code available for payment source | Admitting: Internal Medicine

## 2022-01-30 VITALS — BP 124/82 | HR 97 | Resp 18 | Ht 72.0 in | Wt 176.4 lb

## 2022-01-30 DIAGNOSIS — K551 Chronic vascular disorders of intestine: Secondary | ICD-10-CM

## 2022-01-30 DIAGNOSIS — I255 Ischemic cardiomyopathy: Secondary | ICD-10-CM

## 2022-01-30 DIAGNOSIS — K6289 Other specified diseases of anus and rectum: Secondary | ICD-10-CM

## 2022-01-30 DIAGNOSIS — E876 Hypokalemia: Secondary | ICD-10-CM

## 2022-01-30 DIAGNOSIS — M1710 Unilateral primary osteoarthritis, unspecified knee: Secondary | ICD-10-CM

## 2022-01-30 DIAGNOSIS — Z09 Encounter for follow-up examination after completed treatment for conditions other than malignant neoplasm: Secondary | ICD-10-CM | POA: Diagnosis not present

## 2022-01-30 DIAGNOSIS — I10 Essential (primary) hypertension: Secondary | ICD-10-CM

## 2022-01-30 DIAGNOSIS — N179 Acute kidney failure, unspecified: Secondary | ICD-10-CM

## 2022-01-30 DIAGNOSIS — Z23 Encounter for immunization: Secondary | ICD-10-CM | POA: Diagnosis not present

## 2022-01-30 MED ORDER — ROSUVASTATIN CALCIUM 10 MG PO TABS: 10.0000 mg | ORAL_TABLET | Freq: Every day | ORAL | 5 refills | Status: DC

## 2022-01-30 MED ORDER — TRAMADOL HCL 50 MG PO TABS: 50.0000 mg | ORAL_TABLET | Freq: Three times a day (TID) | ORAL | 0 refills | Status: AC | PRN

## 2022-01-30 NOTE — Assessment & Plan Note (Signed)
Could be causing chronic abdominal pain and lack of appetite Referred to vascular surgery Needs to start taking Crestor

## 2022-01-30 NOTE — Progress Notes (Signed)
Established Patient Office Visit  Subjective:  Patient ID: Jonathan Benitez, male    DOB: [birth year]  Age: 71 y.o. MRN: [id number]  CC:  Chief Complaint  Patient presents with   Transitions Of Care    Toc discharged 01-23-22 pt having knee pain trying to get tramadol refilled tylenol doesn't work still feels a little weak    HPI Jonathan Benitez is a 71 y.o. male with past medical history of HTN, ischemic cardiomyopathy  prostate cancer s/p radiotherapy, GERD, gouty arthritis and SMA stenosis who presents for f/u after recent hospitalization from 01/19/22-01/23/22 for weakness likely due to AKI from dehydration related to poor p.o. intake.  He reported that he had loose stools with solid food and right greater than left upper quadrant abdominal pain, for which he had CT abdomen done.  It showed proctitis, diverticulosis and high-grade SMA stenosis.  GI was consulted, and had colonoscopy, which was grossly unremarkable.  He was also complaining of exertional dyspnea and his BP was low initially.  His lisinopril was held due to low normal BP and AKI. He had echo, which showed LV EF of 45 to 50% with wall motion abnormalities.  Cardiology was consulted and placed him on Coreg and [place] for it.  Later during the hospitalization, his BP started to run high, and was placed on Entresto.  His kidney function had been stable on the day of discharge.  Today, he still complains of mild weakness, but overall feels better.  His appetite has slowly improved.  Of note, he was only able to get Coreg, and was not able to get [place] due to cost concern.  He agrees to contact [place] physician to get it through [place].  He also has not started taking Crestor yet.  He complains of left hip pain and right knee pain, which is chronic.  He usually takes tramadol as needed for knee pain.  He has had left THA done in the past.    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    Past Surgical History:  Procedure Laterality Date   COLONOSCOPY N/A 03/30/2018   Procedure: COLONOSCOPY;  Surgeon: Rogene Houston, MD;  [facility];  Service: Endoscopy;  Laterality: N/A;  12:45   COLONOSCOPY WITH PROPOFOL N/A 01/23/2022   Procedure: COLONOSCOPY WITH PROPOFOL;  Surgeon: Eloise Harman, DO;  Location: [facility];  Service: Endoscopy;  Laterality: N/A;   [place]   POLYPECTOMY  03/30/2018   Procedure: POLYPECTOMY;  Surgeon: Rogene Houston, MD;  Location: [facility];  Service: Endoscopy;;  colon   PROSTATE BIOPSY     positive for cancer   TOTAL HIP ARTHROPLASTY Left    [facility] 11/07/17 Dr. Florian Buff    Family History  Problem Relation Age of Onset   Hypertension Mother    Hypertension Father    Stroke Father    Colon cancer Neg Hx    Colon polyps Neg Hx     Social History   Socioeconomic History   Marital status: Widowed    Spouse name: Not on file   Number of children: Not on file   Years of education: Not on file   Highest education level: Not on file  Occupational History   Not on file  Tobacco Use   Smoking status: Never   Smokeless tobacco: Never  Vaping Use   Vaping Use: Never used  Substance and Sexual Activity   Alcohol use: Yes    Comment: was drinking 3-4 beers a day   Drug use: Yes    Types: Marijuana    Comment: last used 01/15/2022   Sexual activity: Not on file  Other Topics Concern   Not on file  Social History Narrative   Not on file   Social Determinants of Health   Financial Resource Strain: Medium Risk (03/26/2021)   Overall Financial Resource Strain (CARDIA)    Difficulty of Paying Living Expenses: Somewhat hard  Food Insecurity: Food Insecurity Present (01/19/2022)   Hunger Vital Sign    Worried About Running Out of Food in the Last Year: Sometimes  true    Ran Out of Food in the Last Year: Sometimes true  Transportation Needs: No Transportation Needs (01/19/2022)   PRAPARE - Hydrologist (Medical): No    Lack of Transportation (Non-Medical): No  Physical Activity: Insufficiently Active (03/26/2021)   Exercise Vital Sign    Days of Exercise per Week: 7 days    Minutes of Exercise per Session: 20 min  Stress: No Stress Concern Present (03/26/2021)   [place]    Feeling of Stress : Not at all  Social Connections: Socially Isolated (03/26/2021)   Social Connection and Isolation Panel [NHANES]    Frequency of Communication with Friends and Family: More than three times a week    Frequency of Social Gatherings with Friends and Family: More than three times a week    Attends Religious Services: Never    Marine scientist or Organizations: No    Attends Archivist Meetings: Never    Marital Status: Divorced  Human resources officer Violence: Not At Risk (01/19/2022)   Humiliation, Afraid, Rape, and Kick questionnaire    Fear of Current or Ex-Partner: No    Emotionally Abused: No    Physically Abused: No    Sexually Abused: No    Outpatient Medications Prior to Visit  Medication Sig Dispense Refill   carvedilol (COREG) 6.25 MG tablet Take 1 tablet (6.25 mg total) by mouth 2 (two) times daily with a meal. 60 tablet 1   cetirizine (ZYRTEC) 10 MG tablet Take 10 mg by mouth daily.     dapagliflozin propanediol (FARXIGA) 10 MG TABS tablet Take 1 tablet (10 mg total) by mouth daily. 30 tablet 2   feeding supplement (ENSURE ENLIVE / ENSURE PLUS) LIQD Take 237 mLs by mouth 2 (two) times daily between meals.     fluticasone (FLONASE) 50 MCG/ACT nasal spray Place 2 sprays into both nostrils daily as needed for allergies.      hydrocortisone (ANUSOL-HC) 25 MG suppository Place 1 suppository (25 mg total) rectally 2 (two) times daily as needed for  hemorrhoids or anal itching. 12 suppository 0   magnesium oxide (MAG-OX) 400 (240 Mg) MG tablet Take 1 tablet (400 mg total) by mouth 2 (two) times daily. 30 tablet 1   omeprazole (PRILOSEC) 40 MG capsule Take 1 capsule (40 mg total) by mouth daily. 30 capsule 3   sacubitril-valsartan (ENTRESTO) 24-26 MG Take 1 tablet by mouth 2 (two) times daily. 60 tablet 2   thiamine (VITAMIN B-1) 100 MG tablet Take 1 tablet (100 mg total) by mouth daily. [place]  tablet 2   mirtazapine (REMERON) 15 MG tablet Take 1 tablet (15 mg total) by mouth at bedtime. 30 tablet 2   rosuvastatin (CRESTOR) 10 MG tablet Take 1 tablet (10 mg total) by mouth daily. 30 tablet 1   No facility-administered medications prior to visit.    Allergies  Allergen Reactions   Nsaids Other (See Comments)     Anemia, Gastrointestinal hemorrhage    ROS Review of Systems  Constitutional:  Negative for chills and fever.  HENT:  Negative for congestion and sore throat.   Eyes:  Negative for pain and discharge.  Respiratory:  Negative for cough and shortness of breath.   Cardiovascular:  Negative for chest pain and palpitations.  Gastrointestinal:  Positive for abdominal pain, nausea and vomiting. Negative for constipation.  Endocrine: Negative for polydipsia and polyuria.  Genitourinary:  Negative for dysuria and hematuria.  Musculoskeletal:  Positive for arthralgias and back pain. Negative for neck pain and neck stiffness.  Skin:  Negative for rash.  Neurological:  Positive for weakness. Negative for dizziness, numbness and headaches.  Psychiatric/Behavioral:  Negative for agitation and behavioral problems.       Objective:    Physical Exam Vitals reviewed.  Constitutional:      General: He is not in acute distress.    Appearance: He is not diaphoretic.  HENT:     Head: Normocephalic and atraumatic.     Nose: Nose normal.     Mouth/Throat:     Mouth: Mucous membranes are moist.  Eyes:     General: No scleral icterus.     Extraocular Movements: Extraocular movements intact.  Cardiovascular:     Rate and Rhythm: Normal rate and regular rhythm.     Pulses: Normal pulses.     Heart sounds: Normal heart sounds. No murmur heard. Pulmonary:     Breath sounds: Normal breath sounds. No wheezing or rales.  Abdominal:     Palpations: Abdomen is soft.     Tenderness: There is no abdominal tenderness. There is no guarding or rebound.  Musculoskeletal:     Cervical back: Neck supple. No tenderness.     Right lower leg: No edema.     Left lower leg: No edema.     Right foot: Bunion present.  Skin:    General: Skin is warm.     Findings: No rash.  Neurological:     General: No focal deficit present.     Mental Status: He is alert and oriented to person, place, and time.     Sensory: No sensory deficit.     Motor: No weakness.  Psychiatric:        Mood and Affect: Mood normal.        Behavior: Behavior normal.     BP 124/82 (BP Location: Right Arm, Patient Position: Sitting, Cuff Size: Normal)   Pulse 97   Resp 18   Ht 6' (1.829 m)   Wt 176 lb 6.4 oz (80 kg)   SpO2 97%   BMI 23.92 kg/m  Wt Readings from Last 3 Encounters:  01/30/22 176 lb 6.4 oz (80 kg)  01/19/22 175 lb (79.4 kg)  05/30/21 185 lb (83.9 kg)    Lab Results  Component Value Date   TSH 1.858 01/19/2022   Lab Results  Component Value Date   WBC 4.9 01/20/2022   HGB 11.0 (L) 01/20/2022   HCT 33.1 (L) 01/20/2022   MCV 97.6 01/20/2022   PLT 125 (L) 01/20/2022   Lab  Results  Component Value Date   NA 137 01/23/2022   K 3.6 01/23/2022   CO2 23 01/23/2022   GLUCOSE 81 01/23/2022   BUN 5 (L) 01/23/2022   CREATININE 0.91 01/23/2022   BILITOT 0.6 01/23/2022   ALKPHOS 43 01/23/2022   AST 23 01/23/2022   ALT 21 01/23/2022   PROT 7.1 01/23/2022   ALBUMIN 3.4 (L) 01/23/2022   CALCIUM 8.2 (L) 01/23/2022   ANIONGAP 4 (L) 01/23/2022   EGFR 88 09/07/2020   Lab Results  Component Value Date   CHOL 129 09/07/2020   Lab Results   Component Value Date   HDL 73 09/07/2020   Lab Results  Component Value Date   LDLCALC 42 09/07/2020   Lab Results  Component Value Date   TRIG 69 09/07/2020   Lab Results  Component Value Date   CHOLHDL 1.8 09/07/2020   Lab Results  Component Value Date   HGBA1C 4.6 (L) 09/07/2020      Assessment & Plan:   HTN (hypertension) BP Readings from Last 1 Encounters:  01/30/22 124/82   Well-controlled with Coreg Planned to start Entresto for CHF Counseled for compliance with the medications Advised DASH diet and moderate exercise/walking as tolerated  Hospital discharge follow-up Hospital chart reviewed, including discharge summary Medications reconciled and reviewed with the patient in detail  Cardiomyopathy ([place]) Echo showed LVEF of 45 to 50% with wall motion abnormalities Had exertional dyspnea Was placed on Coreg, Entresto and Farxiga He was only able to get Coreg, needs to get [place] through the [place] for cost concern Outpatient follow-up with cardiology  Superior mesenteric artery stenosis ([place]) Could be causing chronic abdominal pain and lack of appetite Referred to vascular surgery Needs to start taking Crestor  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 intake Improved with IV fluid during hospitalization Check BMP today    Meds ordered this encounter  Medications   rosuvastatin (CRESTOR) 10 MG tablet    Sig: Take 1 tablet (10 mg total) by mouth daily.    Dispense:  30 tablet    Refill:  5   traMADol (ULTRAM) 50 MG tablet    Sig: Take 1 tablet (50 mg total) by mouth every 8 (eight) hours as needed for up to 5 days.    Dispense:  15 tablet    Refill:  0     Follow-up: Return in about 4 months (around 06/01/2022) for HTN and CHF.    Lindell Spar, MD

## 2022-01-30 NOTE — Assessment & Plan Note (Signed)
Had colonoscopy during hospital stay GI advised Anusol suppository as needed for rectal pain

## 2022-01-30 NOTE — Assessment & Plan Note (Signed)
Echo showed LVEF of 45 to 50% with wall motion abnormalities Had exertional dyspnea Was placed on Coreg, Entresto and Farxiga He was only able to get Coreg, needs to get [place] through the [place] for cost concern Outpatient follow-up with cardiology

## 2022-01-30 NOTE — Assessment & Plan Note (Signed)
AKI likely due to dehydration from poor p.o. intake Improved with IV fluid during hospitalization Check BMP today

## 2022-01-30 NOTE — Assessment & Plan Note (Signed)
Hospital chart reviewed, including discharge summary Medications reconciled and reviewed with the patient in detail 

## 2022-01-30 NOTE — Assessment & Plan Note (Signed)
Chronic knee pain Gets tramadol through [place], refill provided for now Advised to strictly avoid alcohol and marijuana

## 2022-01-30 NOTE — Assessment & Plan Note (Signed)
BP Readings from Last 1 Encounters:  01/30/22 124/82   Well-controlled with Coreg Planned to start Entresto for CHF Counseled for compliance with the medications Advised DASH diet and moderate exercise/walking as tolerated

## 2022-01-30 NOTE — Patient Instructions (Addendum)
Please ask [place] physician to prescribe Entresto 24-26 mg twice daily  and Farxiga 10 mg once daily for CHF.  Please start taking Carvedilol and Rosuvastatin as prescribed.  Please avoid alcohol and marijuana use.  Please contact [place] physician for further refill of Tramadol.

## 2022-01-31 LAB — CBC WITH DIFFERENTIAL/PLATELET
Basophils Absolute: 0.1 10*3/uL (ref 0.0–0.2)
Basos: 1 %
EOS (ABSOLUTE): 0.1 10*3/uL (ref 0.0–0.4)
Eos: 2 %
Hematocrit: 33.8 % — ABNORMAL LOW (ref 37.5–51.0)
Hemoglobin: 11.4 g/dL — ABNORMAL LOW (ref 13.0–17.7)
Immature Grans (Abs): 0 10*3/uL (ref 0.0–0.1)
Immature Granulocytes: 0 %
Lymphocytes Absolute: 1.5 10*3/uL (ref 0.7–3.1)
Lymphs: 25 %
MCH: 31.9 pg (ref 26.6–33.0)
MCHC: 33.7 g/dL (ref 31.5–35.7)
MCV: 95 fL (ref 79–97)
Monocytes Absolute: 0.8 10*3/uL (ref 0.1–0.9)
Monocytes: 12 %
Neutrophils Absolute: 3.6 10*3/uL (ref 1.4–7.0)
Neutrophils: 60 %
Platelets: 259 10*3/uL (ref 150–450)
RBC: 3.57 x10E6/uL — ABNORMAL LOW (ref 4.14–5.80)
RDW: 13.4 % (ref 11.6–15.4)
WBC: 6.1 10*3/uL (ref 3.4–10.8)

## 2022-01-31 LAB — BASIC METABOLIC PANEL
BUN/Creatinine Ratio: 12 (ref 10–24)
BUN: 12 mg/dL (ref 8–27)
CO2: 24 mmol/L (ref 20–29)
Calcium: 10 mg/dL (ref 8.6–10.2)
Chloride: 100 mmol/L (ref 96–106)
Creatinine, Ser: 1.04 mg/dL (ref 0.76–1.27)
Glucose: 78 mg/dL (ref 70–99)
Potassium: 4.1 mmol/L (ref 3.5–5.2)
Sodium: 138 mmol/L (ref 134–144)
eGFR: 76 mL/min/{1.73_m2} (ref >59)

## 2022-02-01 ENCOUNTER — Inpatient Hospital Stay: Admission: RE | Admit: 2022-02-01 | Payer: No Typology Code available for payment source | Source: Ambulatory Visit

## 2022-02-01 ENCOUNTER — Other Ambulatory Visit: Payer: Self-pay | Admitting: Gastroenterology

## 2022-02-01 DIAGNOSIS — K559 Vascular disorder of intestine, unspecified: Secondary | ICD-10-CM

## 2022-02-04 ENCOUNTER — Telehealth: Payer: Self-pay | Admitting: Interventional Radiology

## 2022-02-04 NOTE — Telephone Encounter (Signed)
Called patient twice to reschedule no show appointment. Did not have a voicemail and did not answer/sb

## 2022-02-20 ENCOUNTER — Encounter: Payer: No Typology Code available for payment source | Admitting: Vascular Surgery

## 2022-02-27 ENCOUNTER — Encounter: Payer: No Typology Code available for payment source | Admitting: Vascular Surgery

## 2022-02-27 ENCOUNTER — Ambulatory Visit (INDEPENDENT_AMBULATORY_CARE_PROVIDER_SITE_OTHER): Payer: No Typology Code available for payment source | Admitting: Internal Medicine

## 2022-02-27 ENCOUNTER — Encounter: Payer: Self-pay | Admitting: Internal Medicine

## 2022-02-27 VITALS — BP 107/70 | HR 86 | Ht 72.0 in | Wt 176.0 lb

## 2022-02-27 DIAGNOSIS — N179 Acute kidney failure, unspecified: Secondary | ICD-10-CM

## 2022-02-27 DIAGNOSIS — R630 Anorexia: Secondary | ICD-10-CM | POA: Diagnosis not present

## 2022-02-27 DIAGNOSIS — R1115 Cyclical vomiting syndrome unrelated to migraine: Secondary | ICD-10-CM

## 2022-02-27 NOTE — Progress Notes (Signed)
Established Patient Office Visit  Subjective:  Patient ID: Jonathan Benitez, male    DOB: 08-14-1950  Age: 71 y.o. MRN: [id number]  CC:  Chief Complaint  Patient presents with   appetite concerns    For last year since 02/27/21    HPI Jonathan Benitez is a 71 y.o. male with past medical history of HTN, ischemic cardiomyopathy  prostate cancer s/p radiotherapy, GERD, gouty arthritis and SMA stenosis who presents for f/u of decreased appetite.  He still complains of decreased appetite.  He reports that he has to smoke marijuana to improve his appetite.  I have recommended to him to avoid marijuana, as it can cause cyclic vomiting syndrome, but he does not agree.  He currently takes trazodone as needed for insomnia.  He does not want to take Remeron for insomnia, which can also improve his appetite.  Denies any fever, chills, chronic cough, hemoptysis, night sweats or LAD.  He recently went to [place] clinic, that his kidney function was abnormal.  He is GFR was 54.  Of note, he is not clear about his current medication list.  It seems that he is taking [place] and [place] both.  He agrees to bring his medications in the next visit for review.    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    Past Surgical History:  Procedure Laterality Date   COLONOSCOPY N/A 03/30/2018   Procedure: COLONOSCOPY;  Surgeon: Rogene Houston, MD;  [facility];  Service: Endoscopy;  Laterality: N/A;  12:45   COLONOSCOPY WITH PROPOFOL N/A 01/23/2022   Procedure: COLONOSCOPY WITH PROPOFOL;  Surgeon: Eloise Harman, DO;  [facility];  Service: Endoscopy;  Laterality: N/A;   [place]   POLYPECTOMY  03/30/2018   Procedure: POLYPECTOMY;  Surgeon: Rogene Houston, MD;  [facility];  Service: Endoscopy;;   colon   PROSTATE BIOPSY     positive for cancer   TOTAL HIP ARTHROPLASTY Left    [facility] 11/07/17 Dr. Florian Buff    Family History  Problem Relation Age of Onset   Hypertension Mother    Hypertension Father    Stroke Father    Colon cancer Neg Hx    Colon polyps Neg Hx     Social History   Socioeconomic History   Marital status: Widowed    Spouse name: Not on file   Number of children: Not on file   Years of education: Not on file   Highest education level: Not on file  Occupational History   Not on file  Tobacco Use   Smoking status: Never   Smokeless tobacco: Never  Vaping Use   Vaping Use: Never used  Substance and Sexual Activity   Alcohol use: Yes    Comment: was drinking 3-4 beers a day   Drug use: Yes    Types: Marijuana    Comment: last used 01/15/2022   Sexual activity: Not on file  Other Topics Concern   Not on file  Social History Narrative   Not on file   Social Determinants of Health   Financial Resource Strain: Medium Risk (03/26/2021)   Overall Financial Resource Strain (CARDIA)    Difficulty of Paying Living Expenses: Somewhat hard  Food Insecurity: Food Insecurity Present (01/19/2022)   Hunger Vital Sign    Worried About Running Out of Food in the Last Year: Sometimes true    Ran Out of Food in the Last Year: Sometimes true  Transportation Needs: No Transportation Needs (01/19/2022)   PRAPARE - Hydrologist (Medical): No    Lack of Transportation (Non-Medical): No  Physical Activity: Insufficiently Active (03/26/2021)   Exercise Vital Sign    Days of Exercise per Week: 7 days    Minutes of Exercise per Session: 20 min  Stress: No Stress Concern Present (03/26/2021)   [place]    Feeling of Stress : Not at all  Social Connections: Socially Isolated (03/26/2021)   Social Connection and Isolation Panel [NHANES]    Frequency of Communication with  Friends and Family: More than three times a week    Frequency of Social Gatherings with Friends and Family: More than three times a week    Attends Religious Services: Never    Marine scientist or Organizations: No    Attends Archivist Meetings: Never    Marital Status: Divorced  Human resources officer Violence: Not At Risk (01/19/2022)   Humiliation, Afraid, Rape, and Kick questionnaire    Fear of Current or Ex-Partner: No    Emotionally Abused: No    Physically Abused: No    Sexually Abused: No    Outpatient Medications Prior to Visit  Medication Sig Dispense Refill   carvedilol (COREG) 6.25 MG tablet Take 1 tablet (6.25 mg total) by mouth 2 (two) times daily with a meal. 60 tablet 1   cetirizine (ZYRTEC) 10 MG tablet Take 10 mg by mouth daily.     dapagliflozin propanediol (FARXIGA) 10 MG TABS tablet Take 1 tablet (10 mg total) by mouth daily. 30 tablet 2   fluticasone (FLONASE) 50 MCG/ACT nasal spray Place 2 sprays into both nostrils daily as needed for allergies.      hydrocortisone (ANUSOL-HC) 25 MG suppository Place 1 suppository (25 mg total) rectally 2 (two) times daily as needed for hemorrhoids or anal itching. 12 suppository 0   magnesium oxide (MAG-OX) 400 (240 Mg) MG tablet Take 1 tablet (400 mg total) by mouth 2 (two) times daily. 30 tablet 1   omeprazole (PRILOSEC) 40 MG capsule Take 1 capsule (40 mg total) by mouth daily. 30 capsule 3   rosuvastatin (CRESTOR) 10 MG tablet Take 1 tablet (10 mg total) by mouth daily. 30 tablet 5   sacubitril-valsartan (ENTRESTO) 24-26 MG Take 1 tablet by mouth 2 (two) times daily. 60 tablet 2   thiamine (VITAMIN B-1) 100 MG tablet Take 1 tablet (100 mg total) by mouth daily. 30 tablet 2   traZODone (DESYREL) 50 MG tablet Take 50 mg by mouth at bedtime as needed.     feeding supplement (ENSURE ENLIVE / ENSURE PLUS) LIQD Take 237 mLs by mouth 2 (two) times daily between meals. (Patient not taking: Reported on 02/27/2022)     No  facility-administered medications prior to visit.    Allergies  Allergen Reactions   Nsaids Other (See Comments)     Anemia, Gastrointestinal hemorrhage    ROS Review of Systems  Constitutional:  Positive for appetite change. Negative for chills and fever.  HENT:  Negative for congestion, sinus pressure and sinus pain.   Respiratory:  Negative for cough and shortness of breath.   Gastrointestinal:  Positive for nausea. Negative for  abdominal pain, diarrhea and vomiting.  Genitourinary:  Negative for dysuria and hematuria.  Musculoskeletal:  Negative for neck pain and neck stiffness.  Skin:  Negative for rash.  Neurological:  Negative for dizziness and weakness.  Psychiatric/Behavioral:  Positive for sleep disturbance. Negative for agitation and behavioral problems.       Objective:    Physical Exam Vitals reviewed.  Constitutional:      General: He is not in acute distress.    Appearance: He is not diaphoretic.  HENT:     Head: Normocephalic and atraumatic.     Nose: Nose normal.     Mouth/Throat:     Mouth: Mucous membranes are moist.  Eyes:     General: No scleral icterus.    Extraocular Movements: Extraocular movements intact.  Cardiovascular:     Rate and Rhythm: Normal rate and regular rhythm.     Pulses: Normal pulses.     Heart sounds: Normal heart sounds. No murmur heard. Pulmonary:     Breath sounds: Normal breath sounds. No wheezing or rales.  Musculoskeletal:     Cervical back: Neck supple. No tenderness.     Right lower leg: No edema.     Left lower leg: No edema.     Right foot: Bunion present.  Skin:    General: Skin is warm.     Findings: No rash.  Neurological:     General: No focal deficit present.     Mental Status: He is alert and oriented to person, place, and time.     Sensory: No sensory deficit.     Motor: No weakness.  Psychiatric:        Mood and Affect: Mood normal.        Behavior: Behavior normal.     BP 107/70 (BP Location:  Left Arm, Patient Position: Sitting, Cuff Size: Normal)   Pulse 86   Ht 6' (1.829 m)   Wt 176 lb (79.8 kg)   SpO2 99%   BMI 23.87 kg/m  Wt Readings from Last 3 Encounters:  02/27/22 176 lb (79.8 kg)  01/30/22 176 lb 6.4 oz (80 kg)  01/19/22 175 lb (79.4 kg)    Lab Results  Component Value Date   TSH 1.858 01/19/2022   Lab Results  Component Value Date   WBC 6.1 01/30/2022   HGB 11.4 (L) 01/30/2022   HCT 33.8 (L) 01/30/2022   MCV 95 01/30/2022   PLT 259 01/30/2022   Lab Results  Component Value Date   NA 138 01/30/2022   K 4.1 01/30/2022   CO2 24 01/30/2022   GLUCOSE 78 01/30/2022   BUN 12 01/30/2022   CREATININE 1.04 01/30/2022   BILITOT 0.6 01/23/2022   ALKPHOS 43 01/23/2022   AST 23 01/23/2022   ALT 21 01/23/2022   PROT 7.1 01/23/2022   ALBUMIN 3.4 (L) 01/23/2022   CALCIUM 10.0 01/30/2022   ANIONGAP 4 (L) 01/23/2022   EGFR 76 01/30/2022   Lab Results  Component Value Date   CHOL 129 09/07/2020   Lab Results  Component Value Date   HDL 73 09/07/2020   Lab Results  Component Value Date   LDLCALC 42 09/07/2020   Lab Results  Component Value Date   TRIG 69 09/07/2020   Lab Results  Component Value Date   CHOLHDL 1.8 09/07/2020   Lab Results  Component Value Date   HGBA1C 4.6 (L) 09/07/2020      Assessment & Plan:   Problem List Items Addressed This  Visit       Digestive   Cyclic vomiting syndrome    Likely in the setting of Marijuana use Avoid Marijuana use - has cut down        Genitourinary   AKI (acute kidney injury) ([place]) - Primary    AKI likely due to dehydration from poor p.o. intake Improved with IV fluid during recent hospitalization Last BMP at [facility] clinic (10/06) showed AKI again, likely due to duplicate meds - has [place] and [place], advised to check at home and stop [place] if he is also taking Jardiance from [place] clinic Recheck BMP after 5-6 weeks Needs to bring home meds for review         Other   Decreased  appetite    Unclear etiology Could be related to chronic marijuana use and/or GERD His weight has been stable recently Has stopped taking protein supplement, advised to try boost if he does not tolerate Ensure Offered Remeron for insomnia and for appetite, he denied       No orders of the defined types were placed in this encounter.   Follow-up: Return if symptoms worsen or fail to improve.    Lindell Spar, MD

## 2022-02-27 NOTE — Patient Instructions (Addendum)
Please continue to take medications as prescribed.  Please continue to follow low salt diet and ambulate as tolerated.  Please check if you have [place]. If you have both, please stop taking [place].  Please bring your home medications in the next visit.

## 2022-02-27 NOTE — Assessment & Plan Note (Signed)
Unclear etiology Could be related to chronic marijuana use and/or GERD His weight has been stable recently Has stopped taking protein supplement, advised to try boost if he does not tolerate Ensure Offered Remeron for insomnia and for appetite, he denied

## 2022-02-27 NOTE — Assessment & Plan Note (Signed)
Likely in the setting of Marijuana use Avoid Marijuana use - has cut down

## 2022-02-27 NOTE — Assessment & Plan Note (Addendum)
AKI likely due to dehydration from poor p.o. intake Improved with IV fluid during recent hospitalization [facility] showed AKI again, likely due to duplicate meds - has Farxiga and Jardiance, advised to check at home and stop Farxiga if he is also [facility] Recheck BMP after 5-6 weeks Needs to bring home meds for review  

## 2022-03-07 ENCOUNTER — Telehealth: Payer: Self-pay | Admitting: Interventional Radiology

## 2022-03-07 NOTE — Telephone Encounter (Signed)
02/04/22-2nd attempt to r/s///NA NVM/sb 02/01/22-No show//tried calling to r/s-NA VNM/sb

## 2022-03-13 ENCOUNTER — Encounter: Payer: No Typology Code available for payment source | Admitting: Vascular Surgery

## 2022-03-14 ENCOUNTER — Ambulatory Visit (INDEPENDENT_AMBULATORY_CARE_PROVIDER_SITE_OTHER): Payer: No Typology Code available for payment source | Admitting: Internal Medicine

## 2022-03-14 ENCOUNTER — Encounter: Payer: Self-pay | Admitting: Internal Medicine

## 2022-03-14 VITALS — BP 132/84 | HR 98 | Resp 18 | Ht 72.0 in | Wt 178.2 lb

## 2022-03-14 DIAGNOSIS — H65111 Acute and subacute allergic otitis media (mucoid) (sanguinous) (serous), right ear: Secondary | ICD-10-CM

## 2022-03-14 DIAGNOSIS — H6121 Impacted cerumen, right ear: Secondary | ICD-10-CM

## 2022-03-14 DIAGNOSIS — I426 Alcoholic cardiomyopathy: Secondary | ICD-10-CM | POA: Diagnosis not present

## 2022-03-14 DIAGNOSIS — N179 Acute kidney failure, unspecified: Secondary | ICD-10-CM

## 2022-03-14 DIAGNOSIS — H65191 Other acute nonsuppurative otitis media, right ear: Secondary | ICD-10-CM | POA: Insufficient documentation

## 2022-03-14 MED ORDER — OFLOXACIN 0.3 % OT SOLN: 5.0000 [drp] | Freq: Every day | OTIC | 0 refills | Status: DC

## 2022-03-14 NOTE — Assessment & Plan Note (Signed)
Echo showed LVEF of 45 to 50% with wall motion abnormalities Had exertional dyspnea Was placed on Coreg, Entresto and Farxiga, but also takes [place] from [place] clinic Advised to stop taking [place] follow-up with cardiology

## 2022-03-14 NOTE — Progress Notes (Signed)
Acute Office Visit  Subjective:    Patient ID: Jonathan Benitez, male    DOB: Oct 31, 1950, 71 y.o.   MRN: [id number]  Chief Complaint  Patient presents with   Ear Pain    Patient having right ear pain since 10-19 feels full like water in it has been putting sweet oil in it and this is not working pain also around the jaw     HPI Patient is in today for complaint of right ear pain for the last 2 weeks.  He reports right ear fullness and dull sounds.  He has tried applying sweet oil without much relief.  He reports history of otitis in the past.  Denies any fever or chills.  He does have slight pain near TM joint area.  Denies any nasal congestion, postnasal drip, sore throat or dizziness currently.  He has brought some of his medicines from [place] clinic.  He is still taking [place] Glade, which was prescribed from recent hospital admission and is also taking Jardiance from [place] clinic.  He agrees to stop taking [place] now.  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/2017    Past Surgical History:  Procedure Laterality Date   COLONOSCOPY N/A 03/30/2018   Procedure: COLONOSCOPY;  Surgeon: Rogene Houston, MD;  [facility];  Service: Endoscopy;  Laterality: N/A;  12:45   COLONOSCOPY WITH PROPOFOL N/A 01/23/2022   Procedure: COLONOSCOPY WITH PROPOFOL;  Surgeon: Eloise Harman, DO;  [facility];  Service: Endoscopy;  Laterality: N/A;   [place]   POLYPECTOMY  03/30/2018   Procedure: POLYPECTOMY;  Surgeon: Rogene Houston, MD;  [facility];  Service: Endoscopy;;  colon   PROSTATE BIOPSY     positive for cancer   TOTAL HIP ARTHROPLASTY Left    [facility] 11/07/17 Dr. Florian Buff    Family History  Problem Relation Age of Onset   Hypertension Mother    Hypertension Father    Stroke Father     Colon cancer Neg Hx    Colon polyps Neg Hx     Social History   Socioeconomic History   Marital status: Widowed    Spouse name: Not on file   Number of children: Not on file   Years of education: Not on file   Highest education level: Not on file  Occupational History   Not on file  Tobacco Use   Smoking status: Never   Smokeless tobacco: Never  Vaping Use   Vaping Use: Never used  Substance and Sexual Activity   Alcohol use: Yes    Comment: was drinking 3-4 beers a day   Drug use: Yes    Types: Marijuana    Comment: last used 01/15/2022   Sexual activity: Not on file  Other Topics Concern   Not on file  Social History Narrative   Not on file   Social Determinants of Health   Financial Resource Strain: Medium Risk (03/26/2021)   Overall Financial Resource Strain (CARDIA)    Difficulty of Paying Living Expenses: Somewhat hard  Food Insecurity: Food Insecurity Present (01/19/2022)   Hunger Vital Sign    Worried About Running Out of Food in the Last Year: Sometimes true    Ran Out of Food  in the Last Year: Sometimes true  Transportation Needs: No Transportation Needs (01/19/2022)   PRAPARE - Hydrologist (Medical): No    Lack of Transportation (Non-Medical): No  Physical Activity: Insufficiently Active (03/26/2021)   Exercise Vital Sign    Days of Exercise per Week: 7 days    Minutes of Exercise per Session: 20 min  Stress: No Stress Concern Present (03/26/2021)   [place]    Feeling of Stress : Not at all  Social Connections: Socially Isolated (03/26/2021)   Social Connection and Isolation Panel [NHANES]    Frequency of Communication with Friends and Family: More than three times a week    Frequency of Social Gatherings with Friends and Family: More than three times a week    Attends Religious Services: Never    Marine scientist or Organizations: No    Attends  Archivist Meetings: Never    Marital Status: Divorced  Human resources officer Violence: Not At Risk (01/19/2022)   Humiliation, Afraid, Rape, and Kick questionnaire    Fear of Current or Ex-Partner: No    Emotionally Abused: No    Physically Abused: No    Sexually Abused: No    Outpatient Medications Prior to Visit  Medication Sig Dispense Refill   carvedilol (COREG) 6.25 MG tablet Take 1 tablet (6.25 mg total) by mouth 2 (two) times daily with a meal. 60 tablet 1   cetirizine (ZYRTEC) 10 MG tablet Take 10 mg by mouth daily.     empagliflozin (JARDIANCE) 25 MG TABS tablet TAKE ONE-HALF TABLET BY MOUTH EVERY DAY FOR CHRONIC HEART FAILURE     feeding supplement (ENSURE ENLIVE / ENSURE PLUS) LIQD Take 237 mLs by mouth 2 (two) times daily between meals.     fluticasone (FLONASE) 50 MCG/ACT nasal spray Place 2 sprays into both nostrils daily as needed for allergies.      hydrocortisone (ANUSOL-HC) 25 MG suppository Place 1 suppository (25 mg total) rectally 2 (two) times daily as needed for hemorrhoids or anal itching. 12 suppository 0   magnesium oxide (MAG-OX) 400 (240 Mg) MG tablet Take 1 tablet (400 mg total) by mouth 2 (two) times daily. 30 tablet 1   omeprazole (PRILOSEC) 40 MG capsule Take 1 capsule (40 mg total) by mouth daily. 30 capsule 3   rosuvastatin (CRESTOR) 10 MG tablet Take 1 tablet (10 mg total) by mouth daily. 30 tablet 5   sacubitril-valsartan (ENTRESTO) 24-26 MG Take 1 tablet by mouth 2 (two) times daily. 60 tablet 2   thiamine (VITAMIN B-1) 100 MG tablet Take 1 tablet (100 mg total) by mouth daily. 30 tablet 2   traZODone (DESYREL) 50 MG tablet Take 50 mg by mouth at bedtime as needed.     dapagliflozin propanediol (FARXIGA) 10 MG TABS tablet Take 1 tablet (10 mg total) by mouth daily. 30 tablet 2   No facility-administered medications prior to visit.    Allergies  Allergen Reactions   Nsaids Other (See Comments)     Anemia, Gastrointestinal hemorrhage    Review  of Systems  Constitutional:  Positive for appetite change. Negative for chills and fever.  HENT:  Positive for ear pain. Negative for congestion, sinus pressure and sinus pain.   Respiratory:  Negative for cough and shortness of breath.   Gastrointestinal:  Positive for nausea. Negative for abdominal pain, diarrhea and vomiting.  Genitourinary:  Negative for dysuria and hematuria.  Musculoskeletal:  Negative for neck pain and neck stiffness.  Skin:  Negative for rash.  Neurological:  Negative for dizziness and weakness.  Psychiatric/Behavioral:  Positive for sleep disturbance. Negative for agitation and behavioral problems.        Objective:    Physical Exam Vitals reviewed.  Constitutional:      General: He is not in acute distress.    Appearance: He is not diaphoretic.  HENT:     Head: Normocephalic and atraumatic.     Right Ear: There is impacted cerumen.     Nose: Nose normal.     Mouth/Throat:     Mouth: Mucous membranes are moist.  Eyes:     General: No scleral icterus.    Extraocular Movements: Extraocular movements intact.  Cardiovascular:     Rate and Rhythm: Normal rate and regular rhythm.     Heart sounds: Normal heart sounds. No murmur heard. Pulmonary:     Breath sounds: Normal breath sounds. No wheezing or rales.  Musculoskeletal:     Cervical back: Neck supple. No tenderness.     Right lower leg: No edema.     Left lower leg: No edema.     Right foot: Bunion present.  Skin:    General: Skin is warm.     Findings: No rash.  Neurological:     General: No focal deficit present.     Mental Status: He is alert and oriented to person, place, and time.  Psychiatric:        Mood and Affect: Mood normal.        Behavior: Behavior normal.     BP 132/84 (BP Location: Right Arm, Patient Position: Sitting, Cuff Size: Normal)   Pulse 98   Resp 18   Ht 6' (1.829 m)   Wt 178 lb 3.2 oz (80.8 kg)   SpO2 99%   BMI 24.17 kg/m  Wt Readings from Last 3 Encounters:   03/14/22 178 lb 3.2 oz (80.8 kg)  02/27/22 176 lb (79.8 kg)  01/30/22 176 lb 6.4 oz (80 kg)        Assessment & Plan:   Problem List Items Addressed This Visit       Cardiovascular and Mediastinum   Cardiomyopathy ([place])    Echo showed LVEF of 45 to 50% with wall motion abnormalities Had exertional dyspnea Was placed on Coreg, Entresto and Farxiga, but also takes [place] from [place] to stop taking [place] follow-up with cardiology        Genitourinary   AKI (acute kidney injury) ([place])    AKI likely due to dehydration from poor p.o. intake Improved with IV fluid during recent hospitalization Last BMP at [facility] clinic (10/06) showed AKI again, likely due to duplicate meds - has [place] and Jardiance, advised to check at home and stop [place] if he is also taking Jardiance from [place] clinic Recheck BMP after 5-6 weeks Needs to bring home meds for review       Other Visit Diagnoses     Impacted cerumen of right ear    -  Primary   Acute mucoid otitis media of right ear       Relevant Medications   ofloxacin (FLOXIN OTIC) 0.3 % OTIC solution        Meds ordered this encounter  Medications   ofloxacin (FLOXIN OTIC) 0.3 % OTIC solution    Sig: Place 5 drops into the right ear daily.    Dispense:  5 mL  Refill:  0     Pervis Macintyre Keith Rake, MD

## 2022-03-14 NOTE — Assessment & Plan Note (Signed)
AKI likely due to dehydration from poor p.o. intake Improved with IV fluid during recent hospitalization Last BMP at [facility] clinic (10/06) showed AKI again, likely due to duplicate meds - has [place] and [place], advised to check at home and stop [place] if he is also taking Jardiance from [place] clinic Recheck BMP after 5-6 weeks Needs to bring home meds for review

## 2022-03-14 NOTE — Patient Instructions (Addendum)
Please stop taking Farxiga - Dapagliflozin.  Please apply Ofloxacin ear drops as prescribed.  Okay to apply Debrox ear drops as needed for excess ear wax. Please avoid using any sharp objects for cleaning purposes.

## 2022-03-14 NOTE — Assessment & Plan Note (Signed)
Has ear fullness and pain Ear irrigation done today - showed impacted cottonball Also has middle ear effusion Started ofloxacin eardrop

## 2022-03-27 ENCOUNTER — Encounter: Payer: Self-pay | Admitting: Vascular Surgery

## 2022-03-27 ENCOUNTER — Ambulatory Visit (INDEPENDENT_AMBULATORY_CARE_PROVIDER_SITE_OTHER): Payer: No Typology Code available for payment source | Admitting: Vascular Surgery

## 2022-03-27 VITALS — BP 135/88 | HR 104 | Temp 97.9°F | Ht 72.0 in | Wt 184.0 lb

## 2022-03-27 DIAGNOSIS — I771 Stricture of artery: Secondary | ICD-10-CM

## 2022-03-27 NOTE — Progress Notes (Signed)
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  He reports a interesting past history of abdominal complaints.  He reports that several years ago he had lost weight down to approximately 130 pounds.  He currently weighs 185 pounds.  He reports that he was having diarrhea every time he ate and this was the cause of his weight loss.  This was approximately 2 years ago according to the patient.  He reports that he began taking fiber and was able to eat and has now gained 55 pounds.  He does report history of severe gastroesophageal reflux.  He reports that he has pain after he eats only when he lies flat and describes heartburn type symptoms.  He specifically denies any typical postprandial abdominal pain associated with mesenteric ischemia.  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    Family History  Problem Relation Age of Onset   Hypertension Mother    Hypertension Father    Stroke Father    Colon cancer Neg Hx    Colon polyps Neg Hx     SOCIAL HISTORY: Social History   Socioeconomic History   Marital status: Widowed    Spouse name: Not on file   Number of children: Not on file   Years of education: Not on file   Highest education level: Not on file  Occupational History   Not on file  Tobacco Use   Smoking status: Never   Smokeless tobacco: Never  Vaping Use   Vaping Use: Never used  Substance and Sexual Activity   Alcohol use: Yes    Comment: was drinking 3-4 beers a day   Drug use: Yes    Types:  Marijuana    Comment: last used 01/15/2022   Sexual activity: Not on file  Other Topics Concern   Not on file  Social History Narrative   Not on file   Social Determinants of Health   Financial Resource Strain: Medium Risk (03/26/2021)   Overall Financial Resource Strain (CARDIA)    Difficulty of Paying Living Expenses: Somewhat hard  Food Insecurity: Food Insecurity Present (01/19/2022)   Hunger Vital Sign    Worried About [place] in the Last Year: Sometimes true    Ran Out of Food in the Last Year: Sometimes true  Transportation Needs: No Transportation Needs (01/19/2022)   PRAPARE - Hydrologist (Medical): No    Lack of Transportation (Non-Medical): No  Physical Activity: Insufficiently Active (03/26/2021)   Exercise Vital Sign    Days of Exercise per Week: 7 days    Minutes of Exercise per Session: 20 min  Stress: No Stress Concern Present (03/26/2021)   [place]    Feeling of Stress : Not at all  Social Connections: Socially Isolated (03/26/2021)   Social Connection and Isolation Panel [NHANES]    Frequency of Communication with Friends and Family: More than three times a week    Frequency of Social Gatherings  with Friends and Family: More than three times a week    Attends Religious Services: Never    Active Member of Clubs or Organizations: No    Attends Archivist Meetings: Never    Marital Status: Divorced  Human resources officer Violence: Not At Risk (01/19/2022)   Humiliation, Afraid, Rape, and Kick questionnaire    Fear of Current or Ex-Partner: No    Emotionally Abused: No    Physically Abused: No    Sexually Abused: No    Allergies  Allergen Reactions   Nsaids Other (See Comments)     Anemia, Gastrointestinal hemorrhage    Current Outpatient Medications  Medication Sig Dispense Refill   carvedilol (COREG) 6.25 MG tablet Take 1 tablet (6.25 mg total)  by mouth 2 (two) times daily with a meal. 60 tablet 1   cetirizine (ZYRTEC) 10 MG tablet Take 10 mg by mouth daily.     empagliflozin (JARDIANCE) 25 MG TABS tablet TAKE ONE-HALF TABLET BY MOUTH EVERY DAY FOR CHRONIC HEART FAILURE     feeding supplement (ENSURE ENLIVE / ENSURE PLUS) LIQD Take 237 mLs by mouth 2 (two) times daily between meals.     fluticasone (FLONASE) 50 MCG/ACT nasal spray Place 2 sprays into both nostrils daily as needed for allergies.      hydrocortisone (ANUSOL-HC) 25 MG suppository Place 1 suppository (25 mg total) rectally 2 (two) times daily as needed for hemorrhoids or anal itching. 12 suppository 0   magnesium oxide (MAG-OX) 400 (240 Mg) MG tablet Take 1 tablet (400 mg total) by mouth 2 (two) times daily. 30 tablet 1   ofloxacin (FLOXIN OTIC) 0.3 % OTIC solution Place 5 drops into the right ear daily. 5 mL 0   omeprazole (PRILOSEC) 40 MG capsule Take 1 capsule (40 mg total) by mouth daily. 30 capsule 3   rosuvastatin (CRESTOR) 10 MG tablet Take 1 tablet (10 mg total) by mouth daily. 30 tablet 5   sacubitril-valsartan (ENTRESTO) 24-26 MG Take 1 tablet by mouth 2 (two) times daily. 60 tablet 2   thiamine (VITAMIN B-1) 100 MG tablet Take 1 tablet (100 mg total) by mouth daily. 30 tablet 2   traZODone (DESYREL) 50 MG tablet Take 50 mg by mouth at bedtime as needed.     No current facility-administered medications for this visit.    REVIEW OF SYSTEMS:  '[X]'$  denotes positive finding, '[ ]'$  denotes negative finding Cardiac  Comments:  Chest pain or chest pressure:    Shortness of breath upon exertion:    Short of breath when lying flat:    Irregular heart rhythm:        Vascular    Pain in calf, thigh, or hip brought on by ambulation:    Pain in feet at night that wakes you up from your sleep:     Blood clot in your veins:    Leg swelling:         Pulmonary    Oxygen at home:    Productive cough:     Wheezing:         Neurologic    Sudden weakness in arms or legs:      Sudden numbness in arms or legs:     Sudden onset of difficulty speaking or slurred speech:    Temporary loss of vision in one eye:     Problems with dizziness:         Gastrointestinal    Blood in stool:     Vomited  blood:         Genitourinary    Burning when urinating:     Blood in urine:        Psychiatric    Major depression:         Hematologic    Bleeding problems:    Problems with blood clotting too easily:        Skin    Rashes or ulcers:        Constitutional    Fever or chills:      PHYSICAL EXAM: Vitals:   03/27/22 1104  BP: 135/88  Pulse: (!) 104  Temp: 97.9 F (36.6 C)  SpO2: 98%  Weight: 184 lb (83.5 kg)  Height: 6' (1.829 m)    GENERAL: The patient is a well-nourished male, in no acute distress. The vital signs are documented above. CARDIOVASCULAR: Plus radial and 2+ femoral pulses bilaterally.  Abdomen soft and nontender.  No abdominal bruits noted PULMONARY: There is good air exchange  MUSCULOSKELETAL: There are no major deformities or cyanosis. NEUROLOGIC: No focal weakness or paresthesias are detected. SKIN: There are no ulcers or rashes noted. PSYCHIATRIC: The patient has a normal affect.  DATA:  I reviewed his CT images from his September 2023 abdominal CT with the patient.  This does show severe calcification in his infrarenal aorta at the origin of his mesenteric vessels.  This appears to be subtotal occlusion at the level of the celiac.  He does have severe calcification but does not appear to have any severe stenosis in his superior mesenteric artery.  He has calcification at the origin of his inferior mesenteric artery.  The artery is patent is difficult to determine the degree of stenosis in the IMA  MEDICAL ISSUES: Discussed these findings at length with the patient.  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  He does not appear to have any symptoms related to  mesenteric ischemia.  He is actually gaining weight rather than losing and does not have any postprandial discomfort.  I did review symptoms of mesenteric ischemia with him and he will notify us immediately should this occur.  He does report continued reflux symptoms and will discuss this with Dr. Posey Pronto at their next visit   Rosetta Posner, MD [facility] [phone] Pager [phone]  Note: Portions of this report may have been transcribed using voice recognition software.  Every effort has been made to ensure accuracy; however, inadvertent computerized transcription errors may still be present.

## 2022-04-01 ENCOUNTER — Telehealth: Payer: Self-pay | Admitting: Internal Medicine

## 2022-04-01 NOTE — Telephone Encounter (Signed)
Will fax [facility] referral info today

## 2022-04-01 NOTE — Telephone Encounter (Signed)
Jonathan Benitez called from [facility] prior [facility] number, patient was seen 03/27/2022 and not yet received authorization #. Call back # [phone] ask for Jonathan Benitez or front office.

## 2022-04-08 ENCOUNTER — Ambulatory Visit: Payer: No Typology Code available for payment source | Admitting: Internal Medicine

## 2022-04-08 ENCOUNTER — Telehealth: Payer: Self-pay | Admitting: Internal Medicine

## 2022-04-08 NOTE — Telephone Encounter (Signed)
Pt called at 8:13 am and said he does not have transportation again.  This is 4th no show.  His appointment was at 2:20 so he did call this am for the 2:20 appointment.  On 9/18 his NS was x 3 and you had said wait to dismiss.  Please advise: Dismiss or Hold?

## 2022-05-28 ENCOUNTER — Telehealth: Payer: Self-pay | Admitting: Internal Medicine

## 2022-05-28 ENCOUNTER — Other Ambulatory Visit: Payer: Self-pay

## 2022-05-28 NOTE — Telephone Encounter (Signed)
Patient states that he is not able to go anywhere do not worry about appt.

## 2022-05-28 NOTE — Telephone Encounter (Signed)
Pt called stating that he has been in the bed for 3 days with gout. The [facility] gave him a medication that started with A that he said is not working. Wants to know if you can please call him in something else to help?    [employer]

## 2022-06-04 ENCOUNTER — Ambulatory Visit: Payer: No Typology Code available for payment source | Admitting: Internal Medicine

## 2022-06-24 ENCOUNTER — Encounter: Payer: Self-pay | Admitting: Internal Medicine

## 2022-11-07 ENCOUNTER — Encounter (HOSPITAL_COMMUNITY): Payer: Self-pay

## 2022-11-07 ENCOUNTER — Inpatient Hospital Stay (HOSPITAL_COMMUNITY)
Admission: EM | Admit: 2022-11-07 | Discharge: 2022-11-08 | DRG: 682 | Disposition: A | Discharge status: Home / Self Care | Payer: No Typology Code available for payment source | Attending: Internal Medicine | Admitting: Internal Medicine

## 2022-11-07 ENCOUNTER — Emergency Department (HOSPITAL_COMMUNITY): Payer: No Typology Code available for payment source

## 2022-11-07 ENCOUNTER — Other Ambulatory Visit: Payer: Self-pay

## 2022-11-07 DIAGNOSIS — Z823 Family history of stroke: Secondary | ICD-10-CM | POA: Diagnosis not present

## 2022-11-07 DIAGNOSIS — E876 Hypokalemia: Secondary | ICD-10-CM | POA: Diagnosis present

## 2022-11-07 DIAGNOSIS — I110 Hypertensive heart disease with heart failure: Secondary | ICD-10-CM | POA: Diagnosis present

## 2022-11-07 DIAGNOSIS — Z886 Allergy status to analgesic agent status: Secondary | ICD-10-CM

## 2022-11-07 DIAGNOSIS — Z923 Personal history of irradiation: Secondary | ICD-10-CM | POA: Diagnosis not present

## 2022-11-07 DIAGNOSIS — Z79899 Other long term (current) drug therapy: Secondary | ICD-10-CM | POA: Diagnosis not present

## 2022-11-07 DIAGNOSIS — K551 Chronic vascular disorders of intestine: Secondary | ICD-10-CM | POA: Diagnosis present

## 2022-11-07 DIAGNOSIS — Z8546 Personal history of malignant neoplasm of prostate: Secondary | ICD-10-CM

## 2022-11-07 DIAGNOSIS — E872 Acidosis, unspecified: Secondary | ICD-10-CM | POA: Diagnosis present

## 2022-11-07 DIAGNOSIS — M109 Gout, unspecified: Secondary | ICD-10-CM | POA: Diagnosis present

## 2022-11-07 DIAGNOSIS — Z96642 Presence of left artificial hip joint: Secondary | ICD-10-CM | POA: Diagnosis present

## 2022-11-07 DIAGNOSIS — I5022 Chronic systolic (congestive) heart failure: Secondary | ICD-10-CM | POA: Diagnosis present

## 2022-11-07 DIAGNOSIS — Z8249 Family history of ischemic heart disease and other diseases of the circulatory system: Secondary | ICD-10-CM | POA: Diagnosis not present

## 2022-11-07 DIAGNOSIS — M879 Osteonecrosis, unspecified: Secondary | ICD-10-CM | POA: Diagnosis present

## 2022-11-07 DIAGNOSIS — J189 Pneumonia, unspecified organism: Secondary | ICD-10-CM | POA: Insufficient documentation

## 2022-11-07 DIAGNOSIS — R911 Solitary pulmonary nodule: Secondary | ICD-10-CM | POA: Diagnosis present

## 2022-11-07 DIAGNOSIS — K5090 Crohn's disease, unspecified, without complications: Secondary | ICD-10-CM | POA: Diagnosis present

## 2022-11-07 DIAGNOSIS — I509 Heart failure, unspecified: Secondary | ICD-10-CM

## 2022-11-07 DIAGNOSIS — N179 Acute kidney failure, unspecified: Principal | ICD-10-CM | POA: Diagnosis present

## 2022-11-07 DIAGNOSIS — I3481 Nonrheumatic mitral (valve) annulus calcification: Secondary | ICD-10-CM | POA: Diagnosis present

## 2022-11-07 DIAGNOSIS — K219 Gastro-esophageal reflux disease without esophagitis: Secondary | ICD-10-CM | POA: Diagnosis present

## 2022-11-07 DIAGNOSIS — E871 Hypo-osmolality and hyponatremia: Secondary | ICD-10-CM | POA: Diagnosis present

## 2022-11-07 DIAGNOSIS — E878 Other disorders of electrolyte and fluid balance, not elsewhere classified: Secondary | ICD-10-CM | POA: Diagnosis present

## 2022-11-07 DIAGNOSIS — Z7984 Long term (current) use of oral hypoglycemic drugs: Secondary | ICD-10-CM | POA: Diagnosis not present

## 2022-11-07 DIAGNOSIS — I959 Hypotension, unspecified: Secondary | ICD-10-CM | POA: Diagnosis present

## 2022-11-07 DIAGNOSIS — R42 Dizziness and giddiness: Secondary | ICD-10-CM | POA: Diagnosis present

## 2022-11-07 DIAGNOSIS — R627 Adult failure to thrive: Secondary | ICD-10-CM | POA: Diagnosis not present

## 2022-11-07 LAB — BASIC METABOLIC PANEL
Anion gap: 20 — ABNORMAL HIGH (ref 5–15)
BUN: 54 mg/dL — ABNORMAL HIGH (ref 8–23)
CO2: 14 mmol/L — ABNORMAL LOW (ref 22–32)
Calcium: 8.4 mg/dL — ABNORMAL LOW (ref 8.9–10.3)
Chloride: 93 mmol/L — ABNORMAL LOW (ref 98–111)
Creatinine, Ser: 4.23 mg/dL — ABNORMAL HIGH (ref 0.61–1.24)
GFR, Estimated: 14 mL/min — ABNORMAL LOW (ref >60)
Glucose, Bld: 93 mg/dL (ref 70–99)
Potassium: 2.8 mmol/L — ABNORMAL LOW (ref 3.5–5.1)
Sodium: 127 mmol/L — ABNORMAL LOW (ref 135–145)

## 2022-11-07 LAB — URINALYSIS, ROUTINE W REFLEX MICROSCOPIC
Bacteria, UA: NONE SEEN
Bilirubin Urine: NEGATIVE
Glucose, UA: NEGATIVE mg/dL
Ketones, ur: NEGATIVE mg/dL
Leukocytes,Ua: NEGATIVE
Nitrite: NEGATIVE
Protein, ur: NEGATIVE mg/dL
Specific Gravity, Urine: 1.004 — ABNORMAL LOW (ref 1.005–1.030)
pH: 6 (ref 5.0–8.0)

## 2022-11-07 LAB — CBC WITH DIFFERENTIAL/PLATELET
Abs Immature Granulocytes: 0.02 10*3/uL (ref 0.00–0.07)
Basophils Absolute: 0 10*3/uL (ref 0.0–0.1)
Basophils Relative: 1 %
Eosinophils Absolute: 0.1 10*3/uL (ref 0.0–0.5)
Eosinophils Relative: 1 %
HCT: 39.9 % (ref 39.0–52.0)
Hemoglobin: 13.5 g/dL (ref 13.0–17.0)
Immature Granulocytes: 0 %
Lymphocytes Relative: 25 %
Lymphs Abs: 1.7 10*3/uL (ref 0.7–4.0)
MCH: 32.4 pg (ref 26.0–34.0)
MCHC: 33.8 g/dL (ref 30.0–36.0)
MCV: 95.7 fL (ref 80.0–100.0)
Monocytes Absolute: 1 10*3/uL (ref 0.1–1.0)
Monocytes Relative: 15 %
Neutro Abs: 3.9 10*3/uL (ref 1.7–7.7)
Neutrophils Relative %: 58 %
Platelets: 206 10*3/uL (ref 150–400)
RBC: 4.17 MIL/uL — ABNORMAL LOW (ref 4.22–5.81)
RDW: 13.3 % (ref 11.5–15.5)
WBC: 6.8 10*3/uL (ref 4.0–10.5)
nRBC: 0 % (ref 0.0–0.2)

## 2022-11-07 LAB — BRAIN NATRIURETIC PEPTIDE: B Natriuretic Peptide: 34 pg/mL (ref 0.0–100.0)

## 2022-11-07 LAB — CK: Total CK: 96 U/L (ref 49–397)

## 2022-11-07 LAB — TSH: TSH: 1.092 u[IU]/mL (ref 0.350–4.500)

## 2022-11-07 LAB — TROPONIN I (HIGH SENSITIVITY)
Troponin I (High Sensitivity): 8 ng/L (ref <18)
Troponin I (High Sensitivity): 6 ng/L (ref <18)

## 2022-11-07 LAB — MAGNESIUM: Magnesium: 1.7 mg/dL (ref 1.7–2.4)

## 2022-11-07 LAB — LACTIC ACID, PLASMA
Lactic Acid, Venous: 2.5 mmol/L (ref 0.5–1.9)
Lactic Acid, Venous: 1.9 mmol/L (ref 0.5–1.9)

## 2022-11-07 MED ORDER — TRAZODONE HCL 50 MG PO TABS: 50.0000 mg | ORAL_TABLET | Freq: Every evening | ORAL | Status: DC | PRN

## 2022-11-07 MED ORDER — ONDANSETRON HCL 4 MG PO TABS: 4.0000 mg | ORAL_TABLET | Freq: Four times a day (QID) | ORAL | Status: DC | PRN

## 2022-11-07 MED ORDER — POTASSIUM CHLORIDE 10 MEQ/100ML IV SOLN
10.0000 meq | INTRAVENOUS | Status: AC
  Administered 2022-11-07 (×3): 10 meq via INTRAVENOUS
  Filled 2022-11-07 (×3): qty 100

## 2022-11-07 MED ORDER — ONDANSETRON HCL 4 MG/2ML IJ SOLN: 4.0000 mg | Freq: Four times a day (QID) | INTRAMUSCULAR | Status: DC | PRN

## 2022-11-07 MED ORDER — POTASSIUM CHLORIDE CRYS ER 20 MEQ PO TBCR
20.0000 meq | EXTENDED_RELEASE_TABLET | Freq: Once | ORAL | Status: AC
  Administered 2022-11-07: 20 meq via ORAL
  Filled 2022-11-07: qty 1

## 2022-11-07 MED ORDER — POTASSIUM CHLORIDE CRYS ER 20 MEQ PO TBCR: 40.0000 meq | EXTENDED_RELEASE_TABLET | Freq: Once | ORAL | Status: DC

## 2022-11-07 MED ORDER — HEPARIN SODIUM (PORCINE) 5000 UNIT/ML IJ SOLN
5000.0000 [IU] | Freq: Three times a day (TID) | INTRAMUSCULAR | Status: DC
  Administered 2022-11-07 – 2022-11-08 (×3): 5000 [IU] via SUBCUTANEOUS
  Filled 2022-11-07 (×3): qty 1

## 2022-11-07 MED ORDER — SODIUM CHLORIDE 0.9 % IV BOLUS
1000.0000 mL | Freq: Once | INTRAVENOUS | Status: AC
  Administered 2022-11-07: 1000 mL via INTRAVENOUS

## 2022-11-07 MED ORDER — ACETAMINOPHEN 325 MG PO TABS: 650.0000 mg | ORAL_TABLET | Freq: Four times a day (QID) | ORAL | Status: DC | PRN

## 2022-11-07 MED ORDER — THIAMINE MONONITRATE 100 MG PO TABS
100.0000 mg | ORAL_TABLET | Freq: Every day | ORAL | Status: DC
  Administered 2022-11-07 – 2022-11-08 (×2): 100 mg via ORAL
  Filled 2022-11-07 (×2): qty 1

## 2022-11-07 MED ORDER — CARVEDILOL 3.125 MG PO TABS
6.2500 mg | ORAL_TABLET | Freq: Two times a day (BID) | ORAL | Status: DC
  Administered 2022-11-07 – 2022-11-08 (×2): 6.25 mg via ORAL
  Filled 2022-11-07 (×2): qty 2

## 2022-11-07 MED ORDER — ENSURE ENLIVE PO LIQD
237.0000 mL | Freq: Two times a day (BID) | ORAL | Status: DC
  Administered 2022-11-07 – 2022-11-08 (×2): 237 mL via ORAL

## 2022-11-07 MED ORDER — ROSUVASTATIN CALCIUM 10 MG PO TABS
10.0000 mg | ORAL_TABLET | Freq: Every day | ORAL | Status: DC
  Administered 2022-11-07 – 2022-11-08 (×2): 10 mg via ORAL
  Filled 2022-11-07 (×2): qty 1

## 2022-11-07 MED ORDER — LORATADINE 10 MG PO TABS
10.0000 mg | ORAL_TABLET | Freq: Every day | ORAL | Status: DC
  Administered 2022-11-07 – 2022-11-08 (×2): 10 mg via ORAL
  Filled 2022-11-07 (×2): qty 1

## 2022-11-07 MED ORDER — ACETAMINOPHEN 650 MG RE SUPP: 650.0000 mg | Freq: Four times a day (QID) | RECTAL | Status: DC | PRN

## 2022-11-07 MED ORDER — SODIUM CHLORIDE 0.9 % IV SOLN
INTRAVENOUS | Status: DC
Start: 2022-11-07 — End: 2022-11-07

## 2022-11-07 MED ORDER — POTASSIUM CHLORIDE CRYS ER 20 MEQ PO TBCR
40.0000 meq | EXTENDED_RELEASE_TABLET | Freq: Two times a day (BID) | ORAL | Status: AC
  Administered 2022-11-07 (×2): 40 meq via ORAL
  Filled 2022-11-07 (×2): qty 2

## 2022-11-07 MED ORDER — SODIUM CHLORIDE 0.9 % IV SOLN: INTRAVENOUS | Status: AC

## 2022-11-07 MED ORDER — ALLOPURINOL 100 MG PO TABS
100.0000 mg | ORAL_TABLET | Freq: Every day | ORAL | Status: DC
  Administered 2022-11-07 – 2022-11-08 (×2): 100 mg via ORAL
  Filled 2022-11-07 (×2): qty 1

## 2022-11-07 MED ORDER — FLUTICASONE PROPIONATE 50 MCG/ACT NA SUSP: 2.0000 | Freq: Every day | NASAL | Status: DC | PRN

## 2022-11-07 MED ORDER — PANTOPRAZOLE SODIUM 40 MG PO TBEC
40.0000 mg | DELAYED_RELEASE_TABLET | Freq: Every day | ORAL | Status: DC
  Administered 2022-11-07 – 2022-11-08 (×2): 40 mg via ORAL
  Filled 2022-11-07 (×2): qty 1

## 2022-11-07 NOTE — ED Notes (Signed)
Pt wants to speak with admitting MD before foley catheter is placed.

## 2022-11-07 NOTE — ED Notes (Signed)
Patient transported to CT 

## 2022-11-07 NOTE — ED Notes (Signed)
Patient transported to X-ray 

## 2022-11-07 NOTE — ED Notes (Signed)
Pt refusing foley cath at this time

## 2022-11-07 NOTE — ED Provider Notes (Signed)
[place] EMERGENCY DEPARTMENT AT [facility] Provider Note   CSN: [id number] Arrival date & time: 11/07/22  4098     History  Chief Complaint  Patient presents with   Dizziness   Shortness of Breath    Jonathan Benitez is a 72 y.o. male.  Past medical history of gout, prostate cancer status post radiotherapy, GERD and seasonal allergies.  Presents the ER complaining of generalized weakness, dizziness on standing up and shortness of breath that gets worse with any exertion for about a month.  Denies any chest pain, fevers or passing out.  States he had a decreased appetite and decreased fluid intake, he reports he thought it was just the heat but he still feeling poorly and wanted to be evaluated.  Does not have any abdominal pain, denies diarrhea or vomiting.   Dizziness Associated symptoms: shortness of breath   Shortness of Breath      Home Medications Prior to Admission medications   Medication Sig Start Date End Date Taking? Authorizing Provider  allopurinol (ZYLOPRIM) 100 MG tablet TAKE ONE TABLET BY MOUTH EVERY DAY TO PREVENT GOUT 04/10/22   [provider]  carvedilol (COREG) 6.25 MG tablet Take 1 tablet (6.25 mg total) by mouth 2 (two) times daily with a meal. 01/23/22   [name], MD  cetirizine (ZYRTEC) 10 MG tablet Take 10 mg by mouth daily.    [provider]  empagliflozin (JARDIANCE) 25 MG TABS tablet TAKE ONE-HALF TABLET BY MOUTH EVERY DAY FOR CHRONIC HEART FAILURE 02/12/22   [provider]  feeding supplement (ENSURE ENLIVE / ENSURE PLUS) LIQD Take 237 mLs by mouth 2 (two) times daily between meals. 01/24/22   [name], MD  fluticasone [facility]) 50 MCG/ACT nasal spray Place 2 sprays into both nostrils daily as needed for allergies.     [provider]  hydrocortisone (ANUSOL-HC) 25 MG suppository Place 1 suppository (25 mg total) rectally 2 (two) times daily as needed for hemorrhoids or anal itching. 01/23/22    [name], MD  magnesium oxide (MAG-OX) 400 (240 Mg) MG tablet Take 1 tablet (400 mg total) by mouth 2 (two) times daily. 01/23/22   [name], MD  ofloxacin (FLOXIN OTIC) 0.3 % OTIC solution Place 5 drops into the right ear daily. 03/14/22   [name], MD  omeprazole (PRILOSEC) 40 MG capsule Take 1 capsule (40 mg total) by mouth daily. 01/23/22   [name], MD  rosuvastatin (CRESTOR) 10 MG tablet Take 1 tablet (10 mg total) by mouth daily. 01/30/22   [name], MD  sacubitril-valsartan (ENTRESTO) 24-26 MG Take 1 tablet by mouth 2 (two) times daily. 01/23/22   [name], MD  thiamine (VITAMIN B-1) 100 MG tablet Take 1 tablet (100 mg total) by mouth daily. 01/24/22   [name], MD  traZODone (DESYREL) 50 MG tablet Take 50 mg by mouth at bedtime as needed.    [provider]      Allergies    Nsaids    Review of Systems   Review of Systems  Respiratory:  Positive for shortness of breath.   Neurological:  Positive for dizziness.    Physical Exam Updated Vital Signs BP 111/89   Pulse 74   Temp 97.6 F (36.4 C) (Oral)   Resp 13   Ht 6' (1.829 m)   Wt 83.9 kg   SpO2 99%   BMI 25.09 kg/m  Physical Exam Vitals and nursing note reviewed.  Constitutional:  General: He is not in acute distress.    Appearance: He is well-developed.  HENT:     Head: Normocephalic and atraumatic.  Eyes:     Extraocular Movements: Extraocular movements intact.     Conjunctiva/sclera: Conjunctivae normal.  Neck:     Vascular: No JVD.  Cardiovascular:     Rate and Rhythm: Normal rate and regular rhythm.     Pulses: No decreased pulses.     Heart sounds: No murmur heard. Pulmonary:     Effort: Pulmonary effort is normal. No respiratory distress.     Breath sounds: Normal breath sounds.  Abdominal:     Palpations: Abdomen is soft.     Tenderness: There is no abdominal tenderness.  Musculoskeletal:        General: No swelling. Normal range of motion.      Cervical back: Neck supple.     Right lower leg: No tenderness. No edema.     Left lower leg: No tenderness. No edema.  Skin:    General: Skin is warm and dry.     Capillary Refill: Capillary refill takes less than 2 seconds.  Neurological:     General: No focal deficit present.     Mental Status: He is alert and oriented to person, place, and time.     Motor: No weakness.  Psychiatric:        Mood and Affect: Mood normal.     ED Results / Procedures / Treatments   Labs (all labs ordered are listed, but only abnormal results are displayed) Labs Reviewed  BASIC METABOLIC PANEL - Abnormal; Notable for the following components:      Result Value   Sodium 127 (*)    Potassium 2.8 (*)    Chloride 93 (*)    CO2 14 (*)    BUN 54 (*)    Creatinine, Ser 4.23 (*)    Calcium 8.4 (*)    GFR, Estimated 14 (*)    Anion gap 20 (*)    All other components within normal limits  CBC WITH DIFFERENTIAL/PLATELET - Abnormal; Notable for the following components:   RBC 4.17 (*)    All other components within normal limits  URINALYSIS, ROUTINE W REFLEX MICROSCOPIC - Abnormal; Notable for the following components:   APPearance HAZY (*)    Specific Gravity, Urine 1.004 (*)    Hgb urine dipstick SMALL (*)    All other components within normal limits  LACTIC ACID, PLASMA - Abnormal; Notable for the following components:   Lactic Acid, Venous 2.5 (*)    All other components within normal limits  BRAIN NATRIURETIC PEPTIDE  MAGNESIUM  CK  LACTIC ACID, PLASMA  TROPONIN I (HIGH SENSITIVITY)  TROPONIN I (HIGH SENSITIVITY)    EKG None  Radiology CT Renal Stone Study  Result Date: 11/07/2022 CLINICAL DATA:  Flank pain. EXAM: CT ABDOMEN AND PELVIS WITHOUT CONTRAST TECHNIQUE: Multidetector CT imaging of the abdomen and pelvis was performed following the standard protocol without IV contrast. RADIATION DOSE REDUCTION: This exam was performed according to the departmental dose-optimization program  which includes automated exposure control, adjustment of the mA and/or kV according to patient size and/or use of iterative reconstruction technique. COMPARISON:  CT 01/21/2022 and older FINDINGS: Lower chest: Slight linear opacity lung bases likely scar or atelectasis. No pleural effusion. Some breathing motion. Small 4 mm nodule seen identified along the course of the minor fissure on series 4, image 11. Prior study does not include this area. Moderate hiatal  hernia. Coronary artery calcifications are seen. Hepatobiliary: On this non IV contrast exam, the liver is grossly preserved. Gallbladder is nondilated. Pancreas: Unremarkable. No pancreatic ductal dilatation or surrounding inflammatory changes. Spleen: Normal in size without focal abnormality. Adrenals/Urinary Tract: The adrenal glands are preserved. Nonspecific bilateral perinephric stranding. The ureters have normal course and caliber extending down to the bladder. No renal or ureteral stones. Preserved contours of the urinary bladder. Stomach/Bowel: No oral contrast. Stomach is nondilated. Normal appendix seen in the right lower quadrant extending into the anterior low pelvis. There is some appendiceal luminal high density material. The large bowel has a normal course and caliber with some scattered left-sided colonic diverticula. Small bowel is nondilated. Vascular/Lymphatic: Normal caliber aorta and IVC. There are significant calcifications along the aorta and branch vessels. Potential significant areas of stenosis seen involving the SMA, celiac. Please correlate with prior CT angiogram describing subtotal occlusive disease of the celiac. Reproductive: Prostate is unremarkable. Other: No free air or free fluid. Musculoskeletal: Old posterior right-sided rib fractures. Degenerative changes of the spine and pelvis. Streak artifact related to patient's left hip arthroplasty. Surgical mesh along the left inguinal region. IMPRESSION: No bowel obstruction,  free air or free fluid. Left-sided colonic diverticula. Normal appendix. No obstructing renal stone. Significant vascular calcifications identified with areas of significant stenosis along the SMA and celiac. Please correlate with the prior CT angiogram. Moderate hiatal hernia. 4 mm lung nodule. No follow-up needed if patient is low-risk.This recommendation follows the consensus statement: Guidelines for Management of Incidental Pulmonary Nodules Detected on CT Images: From the Fleischner Society 2017; Radiology 2017; 284:228-243. Electronically Signed   By: Karen Kays M.D.   On: 11/07/2022 12:13   DG Chest 2 View  Result Date: 11/07/2022 CLINICAL DATA:  sob EXAM: CHEST - 2 VIEW COMPARISON:  CXR 01/19/22 FINDINGS: No pleural effusion. No pneumothorax. Unchanged cardiac and mediastinal contours. Possible small hiatal hernia. Hazy opacity at the left lung base could represent atelectasis or infection. No radiographically apparent acute displaced rib fracture. Visualized upper abdomen is unremarkable. Degenerative changes of the right AC joint. IMPRESSION: Hazy opacity at the left lung base could represent atelectasis or infection. Electronically Signed   By: Lorenza Cambridge M.D.   On: 11/07/2022 10:24    Procedures Procedures    Medications Ordered in ED Medications  potassium chloride 10 mEq in 100 mL IVPB (10 mEq Intravenous New Bag/Given 11/07/22 1211)  sodium chloride 0.9 % bolus 1,000 mL (0 mLs Intravenous Stopped 11/07/22 1114)  sodium chloride 0.9 % bolus 1,000 mL (1,000 mLs Intravenous New Bag/Given 11/07/22 1114)  potassium chloride SA (KLOR-CON M) CR tablet 20 mEq (20 mEq Oral Given 11/07/22 1112)    ED Course/ Medical Decision Making/ A&P Clinical Course as of 11/07/22 1239  Thu Nov 07, 2022  1041 Patient was a soft blood pressure, having dizziness for about a week, states he has not been drinking enough water and thinks he is dehydrated, labs show significant AKI with hyponatremia,  hypochloremia and hypokalemia.  Given initial fluid bolus with mild improvement of blood pressure, will give another fluid bolus, get renal CT Foley catheter to ensure patient does not have any urinary retention and monitor his output [CB]    Clinical Course User Index [CB] Ma Rings, PA-C                             Medical Decision Making This patient presents to the  ED for concern of dizziness, generalize weakness, this involves an extensive number of treatment options, and is a complaint that carries with it a high risk of complications and morbidity.  The differential diagnosis includes dehydration, anemia, renal failure, vertigo, pneumonia, sepsis, COPD, other   Co morbidities that complicate the patient evaluation :   hx cancer   Additional history obtained:  Additional history obtained from EMR External records from outside source obtained and reviewed including notes and labs   Lab Tests:  I Ordered, and personally interpreted labs.  The pertinent results include:    CBC is reassuring BMP has significant abnormalities, sodium 127, potassium 2.8, chloride 93, CO2 is 14, anion gap is 20, BUN is 54 and creatinine is 4.23  Imaging Studies ordered:  I ordered imaging studies including CT renal I independently visualized and interpreted imaging which showed no acute intraabdominal findings I agree with the radiologist interpretation   Cardiac Monitoring: / EKG:  The patient was maintained on a cardiac monitor.  I personally viewed and interpreted the cardiac monitored which showed an underlying rhythm of: sinus rhythm   Consultations Obtained:  I requested consultation with the specialist Dr. Arbutus Leas,  and discussed lab and imaging findings as well as pertinent plan - they recommend: Evaluate patient for admission for his acute kidney failure   Problem List / ED Course / Critical interventions / Medication management  Acute kidney patient has report of decreased  p.o. intake over the past week and feeling dizzy and weak but denies nausea vomiting or diarrhea or any other acute changes, no abdominal pain no fevers or chills.  Noted to have significant AKI with increased anion gap acidosis, was initially hypotensive resolved after 2 L of fluid, he still making urine.  He is feeling somewhat better after the fluids but was advised he needs to stay for further evaluation and treatment.  Discussed with Dr. Arbutus Leas as above for admission.  I have reviewed the patients home medicines and have made adjustments as needed      Amount and/or Complexity of Data Reviewed Labs: ordered. Radiology: ordered.  Risk Prescription drug management. Decision regarding hospitalization.           Final Clinical Impression(s) / ED Diagnoses Final diagnoses:  None    Rx / DC Orders ED Discharge Orders     None         Josem Kaufmann 11/07/22 1239    Vanetta Mulders, MD 11/09/22 [phone]

## 2022-11-07 NOTE — ED Notes (Signed)
Bladder scanned pt, 4mL.

## 2022-11-07 NOTE — ED Notes (Signed)
Date and time results received: 11/07/22 11:26 AM  (use smartphrase ".now" to insert current time)  Test: Lactic acid Critical Value: 2.5   Name of Provider Notified: [place], [place]  Orders Received? Or Actions Taken?: see chart

## 2022-11-07 NOTE — ED Notes (Signed)
Pt back from x-ray.

## 2022-11-07 NOTE — H&P (Addendum)
History and Physical    Jonathan Benitez:[id number] DOB: 1950/08/27 DOA: 11/07/2022  PCP: [name], MD   Patient coming from: Home  Chief Complaint: "No appetite"  HPI: Jonathan Benitez is a 72 y.o. male with medical history significant for HTN, HFmrEF, Crohn's, Prostate cancer s/p radiation 2011, prior episodes of failure to thrive with AKI presents with complaints of decreased hydration and food intake, and is admitted with an AKI complicated by lactic acidosis.  Jonathan Benitez notes that his appetite has "come and gone" for about 5 years. At his best, he notes he drinks a bottle of water each day, but reports drinking less than that over the past week. Also stopped taking his daily potassium replacement over the same time (reports taking daily K for 10 years), and has had 1-2 beers a day (never more). He denies post-prandial pain and is unsure why he has lacked an appetite.  He notes his voice has been more hoarse over the last month, and notes he has produced some green sputum over the same time period, after lying down. Denies cough during the day, chest pain, or pain with deep inspiration. Denies ever smoking cigarettes in his life.   He also notes that his left hip (avascular necrosis) continues to be troublesome but he is actively pursuing further management at the [place] at this time, in addition to endoscopies in the fall.   He denies having any subjective fevers or chills, unexpected weight loss, new bone pains, new joint aches, abdominal pain, vomiting, diarrhea.    ED Course: Jonathan Benitez presented to the ED for generalized weakness and decreased food and water intake. Was hypotensive on arrival to 84/61, received 1L IVF bolus x2, and BP improved to 111/89. Labs showed normal CBC, but AKI with Cr 4.23 (baseline around 1.0), BUN 54, Lactate 2.5 and low electrolytes (Na 127, K 2.8, Cl 93). Trops, BNP and Mg wnl. CT Renal study remarkable for significant vascular calcifications with  stenosis of SMA and Celiac arteries, moderate hiatal hernia, 4mm lung nodule (no tobacco abuse hx). CXR revealed hazy opacity in left lower base c/f pna vs atelectasis, but CT clarified as likely scar vs atelectasis.  Review of Systems: Reviewed as noted above, otherwise negative.  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    Past Surgical History:  Procedure Laterality Date   COLONOSCOPY N/A 03/30/2018   Procedure: COLONOSCOPY;  Surgeon: Malissa Hippo, MD;  [facility];  Service: Endoscopy;  Laterality: N/A;  12:45   COLONOSCOPY WITH PROPOFOL N/A 01/23/2022   Procedure: COLONOSCOPY WITH PROPOFOL;  Surgeon: Lanelle Bal, DO;  Location: [facility];  Service: Endoscopy;  Laterality: N/A;   HERNIA REPAIR     [facility]   POLYPECTOMY  03/30/2018   Procedure: POLYPECTOMY;  Surgeon: Malissa Hippo, MD;  Location: [facility];  Service: Endoscopy;;  colon   PROSTATE BIOPSY     positive for cancer   TOTAL HIP ARTHROPLASTY Left    [facility] 11/07/17 Dr. Nancy Fetter     reports that he has never smoked. He has never used smokeless tobacco. He reports current alcohol use. He reports current drug use. Drug: Marijuana.  Allergies  Allergen Reactions   Nsaids Other (See Comments)  Anemia, Gastrointestinal hemorrhage    Family History  Problem Relation Age of Onset   Hypertension Mother    Hypertension Father    Stroke Father    Colon cancer Neg Hx    Colon polyps Neg Hx     Prior to Admission medications   Medication Sig Start Date End Date Taking? Authorizing Provider  allopurinol (ZYLOPRIM) 100 MG tablet TAKE ONE TABLET BY MOUTH EVERY DAY TO PREVENT GOUT 04/10/22   [provider]  carvedilol (COREG) 6.25 MG tablet Take 1 tablet (6.25 mg total) by mouth 2 (two) times daily with a meal. 01/23/22    [name], MD  cetirizine (ZYRTEC) 10 MG tablet Take 10 mg by mouth daily.    [provider]  empagliflozin (JARDIANCE) 25 MG TABS tablet TAKE ONE-HALF TABLET BY MOUTH EVERY DAY FOR CHRONIC HEART FAILURE 02/12/22   [provider]  feeding supplement (ENSURE ENLIVE / ENSURE PLUS) LIQD Take 237 mLs by mouth 2 (two) times daily between meals. 01/24/22   [name], MD  fluticasone [facility]) 50 MCG/ACT nasal spray Place 2 sprays into both nostrils daily as needed for allergies.     [provider]  hydrocortisone (ANUSOL-HC) 25 MG suppository Place 1 suppository (25 mg total) rectally 2 (two) times daily as needed for hemorrhoids or anal itching. 01/23/22   [name], MD  magnesium oxide (MAG-OX) 400 (240 Mg) MG tablet Take 1 tablet (400 mg total) by mouth 2 (two) times daily. 01/23/22   [name], MD  ofloxacin (FLOXIN OTIC) 0.3 % OTIC solution Place 5 drops into the right ear daily. 03/14/22   [name], MD  omeprazole (PRILOSEC) 40 MG capsule Take 1 capsule (40 mg total) by mouth daily. 01/23/22   [name], MD  rosuvastatin (CRESTOR) 10 MG tablet Take 1 tablet (10 mg total) by mouth daily. 01/30/22   [name], MD  sacubitril-valsartan (ENTRESTO) 24-26 MG Take 1 tablet by mouth 2 (two) times daily. 01/23/22   [name], MD  thiamine (VITAMIN B-1) 100 MG tablet Take 1 tablet (100 mg total) by mouth daily. 01/24/22   [name], MD  traZODone (DESYREL) 50 MG tablet Take 50 mg by mouth at bedtime as needed.    [provider]    Physical Exam: Vitals:   11/07/22 1130 11/07/22 1308 11/07/22 1309 11/07/22 1310  BP:  134/81    Pulse: 74 71 71 80  Resp: 13 13 18 17   Temp:  (!) 97.5 F (36.4 C)    TempSrc:  Oral    SpO2: 99% 97% 100% 99%  Weight:      Height:        Constitutional: NAD, calm, comfortable Vitals:   11/07/22 1130 11/07/22 1308 11/07/22 1309 11/07/22 1310  BP:  134/81    Pulse: 74 71 71 80  Resp: 13 13  18 17   Temp:  (!) 97.5 F (36.4 C)    TempSrc:  Oral    SpO2: 99% 97% 100% 99%  Weight:      Height:       Eyes: lids and conjunctivae normal Neck: normal, supple Respiratory: clear to auscultation bilaterally. Normal respiratory effort. No accessory muscle use.  Cardiovascular: Regular rate and rhythm, no murmurs. Abdomen: no tenderness, no distention. Bowel sounds present.  Musculoskeletal:  No edema. Skin: no rashes, lesions, ulcers.  Psychiatric: Flat affect  Labs on Admission: I have personally reviewed following labs and imaging studies  CBC: Recent Labs  Lab  11/07/22 0942  WBC 6.8  NEUTROABS 3.9  HGB 13.5  HCT 39.9  MCV 95.7  PLT 206   Basic Metabolic Panel: Recent Labs  Lab 11/07/22 0942  NA 127*  K 2.8*  CL 93*  CO2 14*  GLUCOSE 93  BUN 54*  CREATININE 4.23*  CALCIUM 8.4*  MG 1.7   GFR: Estimated Creatinine Clearance: 17.3 mL/min (A) (by C-G formula based on SCr of 4.23 mg/dL (H)). Liver Function Tests: No results for input(s): "AST", "ALT", "ALKPHOS", "BILITOT", "PROT", "ALBUMIN" in the last 168 hours. No results for input(s): "LIPASE", "AMYLASE" in the last 168 hours. No results for input(s): "AMMONIA" in the last 168 hours. Coagulation Profile: No results for input(s): "INR", "PROTIME" in the last 168 hours. Cardiac Enzymes: Recent Labs  Lab 11/07/22 1103  CKTOTAL 96   BNP (last 3 results) No results for input(s): "PROBNP" in the last 8760 hours. HbA1C: No results for input(s): "HGBA1C" in the last 72 hours. CBG: No results for input(s): "GLUCAP" in the last 168 hours. Lipid Profile: No results for input(s): "CHOL", "HDL", "LDLCALC", "TRIG", "CHOLHDL", "LDLDIRECT" in the last 72 hours. Thyroid Function Tests: No results for input(s): "TSH", "T4TOTAL", "FREET4", "T3FREE", "THYROIDAB" in the last 72 hours. Anemia Panel: No results for input(s): "VITAMINB12", "FOLATE", "FERRITIN", "TIBC", "IRON", "RETICCTPCT" in the last 72 hours. Urine  analysis:    Component Value Date/Time   COLORURINE YELLOW 11/07/2022 1117   APPEARANCEUR HAZY (A) 11/07/2022 1117   LABSPEC 1.004 (L) 11/07/2022 1117   PHURINE 6.0 11/07/2022 1117   GLUCOSEU NEGATIVE 11/07/2022 1117   HGBUR SMALL (A) 11/07/2022 1117   BILIRUBINUR NEGATIVE 11/07/2022 1117   KETONESUR NEGATIVE 11/07/2022 1117   PROTEINUR NEGATIVE 11/07/2022 1117   UROBILINOGEN 0.2 12/17/2013 0825   NITRITE NEGATIVE 11/07/2022 1117   LEUKOCYTESUR NEGATIVE 11/07/2022 1117    Radiological Exams on Admission: CT Renal Stone Study  Result Date: 11/07/2022 CLINICAL DATA:  Flank pain. EXAM: CT ABDOMEN AND PELVIS WITHOUT CONTRAST TECHNIQUE: Multidetector CT imaging of the abdomen and pelvis was performed following the standard protocol without IV contrast. RADIATION DOSE REDUCTION: This exam was performed according to the departmental dose-optimization program which includes automated exposure control, adjustment of the mA and/or kV according to patient size and/or use of iterative reconstruction technique. COMPARISON:  CT 01/21/2022 and older FINDINGS: Lower chest: Slight linear opacity lung bases likely scar or atelectasis. No pleural effusion. Some breathing motion. Small 4 mm nodule seen identified along the course of the minor fissure on series 4, image 11. Prior study does not include this area. Moderate hiatal hernia. Coronary artery calcifications are seen. Hepatobiliary: On this non IV contrast exam, the liver is grossly preserved. Gallbladder is nondilated. Pancreas: Unremarkable. No pancreatic ductal dilatation or surrounding inflammatory changes. Spleen: Normal in size without focal abnormality. Adrenals/Urinary Tract: The adrenal glands are preserved. Nonspecific bilateral perinephric stranding. The ureters have normal course and caliber extending down to the bladder. No renal or ureteral stones. Preserved contours of the urinary bladder. Stomach/Bowel: No oral contrast. Stomach is  nondilated. Normal appendix seen in the right lower quadrant extending into the anterior low pelvis. There is some appendiceal luminal high density material. The large bowel has a normal course and caliber with some scattered left-sided colonic diverticula. Small bowel is nondilated. Vascular/Lymphatic: Normal caliber aorta and IVC. There are significant calcifications along the aorta and branch vessels. Potential significant areas of stenosis seen involving the SMA, celiac. Please correlate with prior CT angiogram describing subtotal occlusive disease  of the celiac. Reproductive: Prostate is unremarkable. Other: No free air or free fluid. Musculoskeletal: Old posterior right-sided rib fractures. Degenerative changes of the spine and pelvis. Streak artifact related to patient's left hip arthroplasty. Surgical mesh along the left inguinal region. IMPRESSION: No bowel obstruction, free air or free fluid. Left-sided colonic diverticula. Normal appendix. No obstructing renal stone. Significant vascular calcifications identified with areas of significant stenosis along the SMA and celiac. Please correlate with the prior CT angiogram. Moderate hiatal hernia. 4 mm lung nodule. No follow-up needed if patient is low-risk.This recommendation follows the consensus statement: Guidelines for Management of Incidental Pulmonary Nodules Detected on CT Images: From the Fleischner Society 2017; Radiology 2017; 284:228-243. Electronically Signed   By: Karen Kays M.D.   On: 11/07/2022 12:13   DG Chest 2 View  Result Date: 11/07/2022 CLINICAL DATA:  sob EXAM: CHEST - 2 VIEW COMPARISON:  CXR 01/19/22 FINDINGS: No pleural effusion. No pneumothorax. Unchanged cardiac and mediastinal contours. Possible small hiatal hernia. Hazy opacity at the left lung base could represent atelectasis or infection. No radiographically apparent acute displaced rib fracture. Visualized upper abdomen is unremarkable. Degenerative changes of the right AC  joint. IMPRESSION: Hazy opacity at the left lung base could represent atelectasis or infection. Electronically Signed   By: Lorenza Cambridge M.D.   On: 11/07/2022 10:24    EKG: Independently reviewed. Qtc 487. Left anterior fascicular block.  Assessment/Plan  AKI with Lactic acidosis Pt was hypotensive on arrival to 84/61, received 1L IVF bolus x2, and BP improved to 111/89. Labs showed normal CBC, but AKI with Cr 4.23 (baseline around 1.0), BUN 54, AG of 20, Lactate 2.5. CK normal. No volume overload, hyperkalemia, or concerns of encephalopathy on exam. History most c/w pre-renal injury, CT without renal or urinary tract abnormality.  - Would order additional 1L LR bolus - mIVF 166mL/hr - Avoid nephrotoxic agents  Hyponatremia Pt presented with low electrolytes Na 127, K 2.8, Cl 93. Has received 1L NS bolus x2, and will recheck with BMP this afternoon. Hyponatremia most likely due to poor PO intake, "tea and toast." Would not check Urine Na as pt has already been volume resuscitated. Can check Urine Osm to r/o SIADH given low UA specific gravity of 1.004. Would not raise Na more than 7 meq in first 24 hours. - BMP recheck this afternoon - Urine and Serum Osm  Hypokalemia Presented with K 2.8. Has received of K in ED alongside volume resuscitation and should be replete. Did have mildly widened Qtc to 487.  - will recheck BMP this afternoon and replete as necessary  Failure to thrive Pt with weak appetite at baseline but with fair Body mass index is 25.09 kg/m. His likely pre-renal AKI is most consistent with poor PO hydration, reportedly at best 1 bottle of water a day. - Will benefit from multidisciplinary approach between Social Work, nutritional therapy, and ultimately follow up with outpatient PCP  HFmrEF 01/20/22 Echo with EF 45-50% and LV wall motion abnormalities and moderate to severe mitral annular calcification. Stable and at his baseline, without volume overload. Will hold  Entresto iso AKI, and Carvedilol while BP is soft. - Hold home Entresto and Carvedilol today, 6/27  SMA Stenosis Known diagnosis but re-demonstrated on 6/27 CT Renal study. No post-prandial pain at this time. Will need further outpatient follow up. - Continue home Crestor 10mg .  DVT prophylaxis: 5k Heparin TID Code Status: Full Family Communication: Will ask pt who he would like [place] to  update Disposition Plan: Home Consults called: None  Signed,  Marcelline Mates, MS4 Working with Dr. Maurilio Lovely

## 2022-11-07 NOTE — ED Triage Notes (Signed)
Pt has been feeling weak and dizzy, short of breath, and had a low appetite for over a week. Patient delayed coming in due to [facility], but is now here for relief

## 2022-11-07 NOTE — ED Notes (Signed)
Dr. Shah at bedside.

## 2022-11-08 DIAGNOSIS — J189 Pneumonia, unspecified organism: Secondary | ICD-10-CM | POA: Insufficient documentation

## 2022-11-08 LAB — BASIC METABOLIC PANEL
Anion gap: 10 (ref 5–15)
BUN: 35 mg/dL — ABNORMAL HIGH (ref 8–23)
CO2: 18 mmol/L — ABNORMAL LOW (ref 22–32)
Calcium: 8 mg/dL — ABNORMAL LOW (ref 8.9–10.3)
Chloride: 108 mmol/L (ref 98–111)
Creatinine, Ser: 1.36 mg/dL — ABNORMAL HIGH (ref 0.61–1.24)
GFR, Estimated: 55 mL/min — ABNORMAL LOW (ref >60)
Glucose, Bld: 101 mg/dL — ABNORMAL HIGH (ref 70–99)
Potassium: 3.7 mmol/L (ref 3.5–5.1)
Sodium: 136 mmol/L (ref 135–145)

## 2022-11-08 LAB — CBC
HCT: 34.2 % — ABNORMAL LOW (ref 39.0–52.0)
Hemoglobin: 11.3 g/dL — ABNORMAL LOW (ref 13.0–17.0)
MCH: 32.8 pg (ref 26.0–34.0)
MCHC: 33 g/dL (ref 30.0–36.0)
MCV: 99.4 fL (ref 80.0–100.0)
Platelets: 167 10*3/uL (ref 150–400)
RBC: 3.44 MIL/uL — ABNORMAL LOW (ref 4.22–5.81)
RDW: 13.5 % (ref 11.5–15.5)
WBC: 4.4 10*3/uL (ref 4.0–10.5)
nRBC: 0 % (ref 0.0–0.2)

## 2022-11-08 LAB — LACTIC ACID, PLASMA: Lactic Acid, Venous: 0.9 mmol/L (ref 0.5–1.9)

## 2022-11-08 LAB — MAGNESIUM: Magnesium: 1.5 mg/dL — ABNORMAL LOW (ref 1.7–2.4)

## 2022-11-08 MED ORDER — CEFDINIR 300 MG PO CAPS: 300.0000 mg | ORAL_CAPSULE | Freq: Two times a day (BID) | ORAL | 0 refills | Status: AC

## 2022-11-08 MED ORDER — MAGNESIUM SULFATE 2 GM/50ML IV SOLN
2.0000 g | Freq: Once | INTRAVENOUS | Status: AC
  Administered 2022-11-08: 2 g via INTRAVENOUS
  Filled 2022-11-08: qty 50

## 2022-11-08 MED ORDER — SACUBITRIL-VALSARTAN 24-26 MG PO TABS: 1.0000 | ORAL_TABLET | Freq: Two times a day (BID) | ORAL | 2 refills | Status: DC

## 2022-11-08 MED ORDER — SODIUM CHLORIDE 0.9 % IV SOLN
500.0000 mg | Freq: Once | INTRAVENOUS | Status: AC
  Administered 2022-11-08: 500 mg via INTRAVENOUS
  Filled 2022-11-08: qty 5

## 2022-11-08 MED ORDER — SODIUM CHLORIDE 0.9 % IV SOLN
2.0000 g | Freq: Once | INTRAVENOUS | Status: AC
  Administered 2022-11-08: 2 g via INTRAVENOUS
  Filled 2022-11-08: qty 20

## 2022-11-08 MED ORDER — AZITHROMYCIN 500 MG PO TABS: 500.0000 mg | ORAL_TABLET | Freq: Every day | ORAL | 0 refills | Status: AC

## 2022-11-08 NOTE — Discharge Summary (Signed)
Physician Discharge Summary  Jonathan Benitez UJW:[id number] DOB: 1951/01/12 DOA: 11/07/2022  PCP: [name], MD  Admit date: 11/07/2022  Discharge date: 11/08/2022  Admitted From: Home  Disposition:  Home  Recommendations for Outpatient Follow-up:  Follow up with PCP in 1-2 weeks Please obtain BMP/CBC in one week Please follow up on the following pending results: None Resume Entresto tomorrow, 6/29 Continue to take antibiotics for pneumonia (Cefdinir and Azithromycin), with your last doses on Tuesday, 7/2. Encourage Jonathan Benitez to ensure he hydrates well with water, and consumes a Boost each day.  Home Health: None  Equipment/Devices: None  Discharge Condition:Stable  CODE STATUS: Full  Diet recommendation: Heart Healthy  Brief/Interim Summary:  Jonathan Benitez is a 72 y.o. male with medical history significant for HTN, HFmrEF, Crohn's, Prostate cancer s/p radiation 2011, prior episodes of failure to thrive c/b AKI who presented with complaints of decreased hydration and food intake, and was admitted with an AKI complicated by lactic acidosis.   Jonathan Benitez presented to the ED for generalized weakness and decreased food and water intake. Was hypotensive on arrival to 84/61, received 1L IVF bolus x2, and BP improved to 111/89. Labs significant for AKI with Cr 4.23 (baseline around 1.0), BUN 54, Lactate 2.5 and low electrolytes (Na 127, K 2.8, Cl 93). CT Renal study remarkable for significant vascular calcifications with stenosis of SMA and Celiac arteries, moderate hiatal hernia, 4mm lung nodule (no tobacco abuse hx). CXR revealed hazy opacity in left lower base c/f pna.  After volume resuscitation, by the morning of HD1 Jonathan Benitez's Cr improved from 4.23 to 1.36, and his electrolytes normalized. For his pneumonia, he was started on Rocephin and Azithromycin on 6/28, and should take complete his antibiotic course by 7/2 (Cefdinir and Azithromycin).   Discharge Diagnoses:   Active Problems:   AKI (acute kidney injury) (HCC)   Failure to thrive in adult   Hypokalemia   CAP (community acquired pneumonia)    Discharge Instructions  Discharge Instructions     Diet - low sodium heart healthy   Complete by: As directed    Increase activity slowly   Complete by: As directed       Allergies as of 11/08/2022       Reactions   Nsaids Other (See Comments)    Anemia, Gastrointestinal hemorrhage        Medication List     TAKE these medications    allopurinol 100 MG tablet Commonly known as: ZYLOPRIM TAKE ONE TABLET BY MOUTH EVERY DAY TO PREVENT GOUT   azithromycin 500 MG tablet Commonly known as: Zithromax Take 1 tablet (500 mg total) by mouth daily for 4 days. Start taking on: November 09, 2022   carvedilol 6.25 MG tablet Commonly known as: COREG Take 1 tablet (6.25 mg total) by mouth 2 (two) times daily with a meal.   cefdinir 300 MG capsule Commonly known as: OMNICEF Take 1 capsule (300 mg total) by mouth 2 (two) times daily for 4 days. Start taking on: November 09, 2022   cetirizine 10 MG tablet Commonly known as: ZYRTEC Take 10 mg by mouth daily.   empagliflozin 25 MG Tabs tablet Commonly known as: JARDIANCE TAKE ONE-HALF TABLET BY MOUTH EVERY DAY FOR CHRONIC HEART FAILURE   feeding supplement Liqd Take 237 mLs by mouth 2 (two) times daily between meals.   fluticasone 50 MCG/ACT nasal spray Commonly known as: FLONASE Place 2 sprays into both nostrils daily as needed for allergies.  hydrocortisone 25 MG suppository Commonly known as: ANUSOL-HC Place 1 suppository (25 mg total) rectally 2 (two) times daily as needed for hemorrhoids or anal itching.   magnesium oxide 400 (240 Mg) MG tablet Commonly known as: MAG-OX Take 1 tablet (400 mg total) by mouth 2 (two) times daily.   ofloxacin 0.3 % OTIC solution Commonly known as: Floxin Otic Place 5 drops into the right ear daily.   omeprazole 40 MG capsule Commonly known as:  PRILOSEC Take 1 capsule (40 mg total) by mouth daily.   rosuvastatin 10 MG tablet Commonly known as: CRESTOR Take 1 tablet (10 mg total) by mouth daily.   sacubitril-valsartan 24-26 MG Commonly known as: ENTRESTO Take 1 tablet by mouth 2 (two) times daily. Start taking on: November 09, 2022   thiamine 100 MG tablet Commonly known as: Vitamin B-1 Take 1 tablet (100 mg total) by mouth daily.   traZODone 50 MG tablet Commonly known as: DESYREL Take 50 mg by mouth at bedtime as needed.        Follow-up Information     [name], MD. Schedule an appointment as soon as possible for a visit in 1 week(s).   Specialty: Internal Medicine Contact information: [address] [place] [address] [phone]                Allergies  Allergen Reactions   Nsaids Other (See Comments)     Anemia, Gastrointestinal hemorrhage    Consultations: None   Procedures/Studies: CT Renal Stone Study  Result Date: 11/07/2022 CLINICAL DATA:  Flank pain. EXAM: CT ABDOMEN AND PELVIS WITHOUT CONTRAST TECHNIQUE: Multidetector CT imaging of the abdomen and pelvis was performed following the standard protocol without IV contrast. RADIATION DOSE REDUCTION: This exam was performed according to the departmental dose-optimization program which includes automated exposure control, adjustment of the mA and/or kV according to patient size and/or use of iterative reconstruction technique. COMPARISON:  CT 01/21/2022 and older FINDINGS: Lower chest: Slight linear opacity lung bases likely scar or atelectasis. No pleural effusion. Some breathing motion. Small 4 mm nodule seen identified along the course of the minor fissure on series 4, image 11. Prior study does not include this area. Moderate hiatal hernia. Coronary artery calcifications are seen. Hepatobiliary: On this non IV contrast exam, the liver is grossly preserved. Gallbladder is nondilated. Pancreas: Unremarkable. No pancreatic ductal  dilatation or surrounding inflammatory changes. Spleen: Normal in size without focal abnormality. Adrenals/Urinary Tract: The adrenal glands are preserved. Nonspecific bilateral perinephric stranding. The ureters have normal course and caliber extending down to the bladder. No renal or ureteral stones. Preserved contours of the urinary bladder. Stomach/Bowel: No oral contrast. Stomach is nondilated. Normal appendix seen in the right lower quadrant extending into the anterior low pelvis. There is some appendiceal luminal high density material. The large bowel has a normal course and caliber with some scattered left-sided colonic diverticula. Small bowel is nondilated. Vascular/Lymphatic: Normal caliber aorta and IVC. There are significant calcifications along the aorta and branch vessels. Potential significant areas of stenosis seen involving the SMA, celiac. Please correlate with prior CT angiogram describing subtotal occlusive disease of the celiac. Reproductive: Prostate is unremarkable. Other: No free air or free fluid. Musculoskeletal: Old posterior right-sided rib fractures. Degenerative changes of the spine and pelvis. Streak artifact related to patient's left hip arthroplasty. Surgical mesh along the left inguinal region. IMPRESSION: No bowel obstruction, free air or free fluid. Left-sided colonic diverticula. Normal appendix. No obstructing renal stone. Significant vascular  calcifications identified with areas of significant stenosis along the SMA and celiac. Please correlate with the prior CT angiogram. Moderate hiatal hernia. 4 mm lung nodule. No follow-up needed if patient is low-risk.This recommendation follows the consensus statement: Guidelines for Management of Incidental Pulmonary Nodules Detected on CT Images: From the Fleischner Society 2017; Radiology 2017; 284:228-243. Electronically Signed   By: Karen Kays M.D.   On: 11/07/2022 12:13   DG Chest 2 View  Result Date: 11/07/2022 CLINICAL  DATA:  sob EXAM: CHEST - 2 VIEW COMPARISON:  CXR 01/19/22 FINDINGS: No pleural effusion. No pneumothorax. Unchanged cardiac and mediastinal contours. Possible small hiatal hernia. Hazy opacity at the left lung base could represent atelectasis or infection. No radiographically apparent acute displaced rib fracture. Visualized upper abdomen is unremarkable. Degenerative changes of the right AC joint. IMPRESSION: Hazy opacity at the left lung base could represent atelectasis or infection. Electronically Signed   By: Lorenza Cambridge M.D.   On: 11/07/2022 10:24     Discharge Exam: Vitals:   11/07/22 2012 11/08/22 0309  BP: 114/70 111/67  Pulse: 83 84  Resp: 20 18  Temp: 98.2 F (36.8 C) 98.4 F (36.9 C)  SpO2: 100% 99%   Vitals:   11/07/22 1346 11/07/22 1831 11/07/22 2012 11/08/22 0309  BP: (!) 146/96 93/64 114/70 111/67  Pulse: 76 91 83 84  Resp:  20 20 18   Temp: 97.7 F (36.5 C) 98.2 F (36.8 C) 98.2 F (36.8 C) 98.4 F (36.9 C)  TempSrc: Oral Oral Oral Oral  SpO2: 100% 98% 100% 99%  Weight: 86.6 kg     Height: 6' (1.829 m)       General: Pt is alert, awake, not in acute distress HEENT: MMM Cardiovascular: RRR, S1/S2 +, no rubs, no gallops Respiratory: CTA bilaterally, no wheezing, no rhonchi Abdominal: Soft, NT, ND, bowel sounds + Extremities: no edema, no cyanosis    The results of significant diagnostics from this hospitalization (including imaging, microbiology, ancillary and laboratory) are listed below for reference.     Microbiology: No results found for this or any previous visit (from the past 240 hour(s)).   Labs: BNP (last 3 results) Recent Labs    11/07/22 0942  BNP 34.0   Basic Metabolic Panel: Recent Labs  Lab 11/07/22 0942 11/08/22 0707  NA 127* 136  K 2.8* 3.7  CL 93* 108  CO2 14* 18*  GLUCOSE 93 101*  BUN 54* 35*  CREATININE 4.23* 1.36*  CALCIUM 8.4* 8.0*  MG 1.7 1.5*   Liver Function Tests: No results for input(s): "AST", "ALT",  "ALKPHOS", "BILITOT", "PROT", "ALBUMIN" in the last 168 hours. No results for input(s): "LIPASE", "AMYLASE" in the last 168 hours. No results for input(s): "AMMONIA" in the last 168 hours. CBC: Recent Labs  Lab 11/07/22 0942 11/08/22 0429  WBC 6.8 4.4  NEUTROABS 3.9  --   HGB 13.5 11.3*  HCT 39.9 34.2*  MCV 95.7 99.4  PLT 206 167   Cardiac Enzymes: Recent Labs  Lab 11/07/22 1103  CKTOTAL 96   BNP: Invalid input(s): "POCBNP" CBG: No results for input(s): "GLUCAP" in the last 168 hours. D-Dimer No results for input(s): "DDIMER" in the last 72 hours. Hgb A1c No results for input(s): "HGBA1C" in the last 72 hours. Lipid Profile No results for input(s): "CHOL", "HDL", "LDLCALC", "TRIG", "CHOLHDL", "LDLDIRECT" in the last 72 hours. Thyroid function studies Recent Labs    11/07/22 1105  TSH 1.092   Anemia work up No results for  input(s): "VITAMINB12", "FOLATE", "FERRITIN", "TIBC", "IRON", "RETICCTPCT" in the last 72 hours. Urinalysis    Component Value Date/Time   COLORURINE YELLOW 11/07/2022 1117   APPEARANCEUR HAZY (A) 11/07/2022 1117   LABSPEC 1.004 (L) 11/07/2022 1117   PHURINE 6.0 11/07/2022 1117   GLUCOSEU NEGATIVE 11/07/2022 1117   HGBUR SMALL (A) 11/07/2022 1117   BILIRUBINUR NEGATIVE 11/07/2022 1117   KETONESUR NEGATIVE 11/07/2022 1117   PROTEINUR NEGATIVE 11/07/2022 1117   UROBILINOGEN 0.2 12/17/2013 0825   NITRITE NEGATIVE 11/07/2022 1117   LEUKOCYTESUR NEGATIVE 11/07/2022 1117   Sepsis Labs Recent Labs  Lab 11/07/22 0942 11/08/22 0429  WBC 6.8 4.4   Microbiology No results found for this or any previous visit (from the past 240 hour(s)).   Time coordinating discharge: 35 minutes  SIGNED:  Marcelline Mates, MS4 Working with Dr. Maurilio Lovely

## 2022-11-08 NOTE — Discharge Instructions (Signed)
Life Alert: As you requested the number to reach for more information on getting a life alert is [phone].

## 2022-11-08 NOTE — TOC Initial Note (Addendum)
Transition of Care [facility]) - Initial/Assessment Note    Patient Details  Name: Jonathan Benitez MRN: [id number] Date of Birth: 04-28-1951  [facility]) CM/SW Contact:    Villa Herb, LCSWA Phone Number: 11/08/2022, 9:33 AM  Clinical Narrative:                 Pt is high risk for readmission. CSW spoke with pt to complete assessment. Pt lives alone and is independent in completing his ADLs. Pt states he drives to appointments as needed. Pt has not had HH in the past and states that it is last resort. Pt states that he does not use any DME in the home. Pt is interested in a life alert, CSW explained that info would be added to AVS for pt on this. Life Alert cannot be ordered from hospital for pt as it is not considered DME and insurance will not cover.   [facility] notified of pts hospital admission. [facility] notification ID is [id number] TOC to follow.   Expected Discharge Plan: Home/Self Care Barriers to Discharge: Continued Medical Work up   Patient Goals and CMS Choice Patient states their goals for this hospitalization and ongoing recovery are:: return home CMS Medicare.gov Compare Post Acute Care list provided to:: Patient Choice offered to / list presented to : Patient      Expected Discharge Plan and Services In-house Referral: Clinical Social Work Discharge Planning Services: CM Consult   Living arrangements for the past 2 months: Single Family Home                                      Prior Living Arrangements/Services Living arrangements for the past 2 months: Single Family Home Lives with:: Self Patient language and need for interpreter reviewed:: Yes Do you feel safe going back to the place where you live?: Yes      Need for Family Participation in Patient Care: Yes (Comment) Care giver support system in place?: Yes (comment)   Criminal Activity/Legal Involvement Pertinent to Current Situation/Hospitalization: No - Comment as  needed  Activities of Daily Living Home Assistive Devices/Equipment: None ADL Screening (condition at time of admission) Patient's cognitive ability adequate to safely complete daily activities?: Yes Is the patient deaf or have difficulty hearing?: No Does the patient have difficulty seeing, even when wearing glasses/contacts?: No Does the patient have difficulty concentrating, remembering, or making decisions?: No Patient able to express need for assistance with ADLs?: Yes Does the patient have difficulty dressing or bathing?: No Independently performs ADLs?: Yes (appropriate for developmental age) Does the patient have difficulty walking or climbing stairs?: No Weakness of Legs: None Weakness of Arms/Hands: None  Permission Sought/Granted                  Emotional Assessment Appearance:: Appears stated age Attitude/Demeanor/Rapport: Engaged Affect (typically observed): Accepting Orientation: : Oriented to Self, Oriented to Place, Oriented to  Time, Oriented to Situation Alcohol / Substance Use: Not Applicable Psych Involvement: No (comment)  Admission diagnosis:  AKI (acute kidney injury) (HCC) [N17.9] Acute renal failure, unspecified acute renal failure type [facility]) [N17.9] Patient Active Problem List   Diagnosis Date Noted   Acute mucoid otitis media of right ear 03/14/2022   Decreased appetite 02/27/2022   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   Hospital discharge follow-up 01/30/2022   Loss of weight    Proctitis 01/21/2022  Cardiomyopathy (HCC) 01/20/2022   AKI (acute kidney injury) (HCC) 01/19/2022   Failure to thrive in adult 01/19/2022   Marijuana abuse 01/19/2022   Hypokalemia 01/19/2022   Elevated troponin 01/19/2022   Pain in unspecified ankle and joints of unspecified foot 06/07/2021   Osteoarthritis 05/01/2021   Acute idiopathic gout of left ankle 05/01/2021   Iron deficiency anemia due to chronic blood loss 02/14/2021   Neck pain  02/14/2021   Bunion of great toe of right foot 02/14/2021   Abnormal CT of the abdomen 01/09/2021   Primary insomnia 09/07/2020   Chronic diarrhea 05/19/2020   Arthritis of knee 05/19/2020   Moderate protein-calorie malnutrition (HCC) 05/19/2020   Cyclic vomiting syndrome 05/19/2020   Rectal mass 03/25/2018   Rectal bleeding 03/25/2018   Avascular necrosis of hip, left (HCC) 11/12/2017   Hypoalbuminemia 11/12/2017   Gastroesophageal reflux disease without esophagitis 12/19/2016   Anemia 06/28/2012   Prostate cancer (HCC) 06/28/2012   HTN (hypertension) 06/28/2012   Crohn's disease (HCC) 06/28/2012   Alcoholism (HCC) 06/28/2012   PCP:  [name], MD Pharmacy:   [facility] [place], [place] - [address] [place] [address] Phone: [phone] Fax: [phone]  [facility] [place], [place] - [address] [place] [address] Phone: [phone] Fax: [phone]     Social Determinants of Health (SDOH) Social History: SDOH Screenings   Food Insecurity: No Food Insecurity (11/07/2022)  Housing: Low Risk  (11/07/2022)  Transportation Needs: No Transportation Needs (11/07/2022)  Utilities: Not At Risk (11/07/2022)  Depression (PHQ2-9): Low Risk  (03/14/2022)  Financial Resource Strain: Medium Risk (03/26/2021)  Physical Activity: Insufficiently Active (03/26/2021)  Social Connections: Socially Isolated (03/26/2021)  Stress: No Stress Concern Present (03/26/2021)  Tobacco Use: Low Risk  (11/07/2022)   SDOH Interventions:     Readmission Risk Interventions    11/08/2022    9:32 AM  Readmission Risk Prevention Plan  Transportation Screening Complete  HRI or Home Care Consult Complete  Social Work Consult for Recovery Care Planning/Counseling Complete  Palliative Care Screening Not Applicable  Medication Review [employer]) Complete

## 2022-11-08 NOTE — Progress Notes (Signed)
Nsg Discharge Note  Admit Date:  11/07/2022 Discharge date: 11/08/2022   Smith DASAN ALMONTE to be D/C'd Home per MD order.  AVS completed.   Patient/caregiver able to verbalize understanding.  Discharge Medication: Allergies as of 11/08/2022       Reactions   Nsaids Other (See Comments)    Anemia, Gastrointestinal hemorrhage        Medication List     TAKE these medications    allopurinol 100 MG tablet Commonly known as: ZYLOPRIM TAKE ONE TABLET BY MOUTH EVERY DAY TO PREVENT GOUT   azithromycin 500 MG tablet Commonly known as: Zithromax Take 1 tablet (500 mg total) by mouth daily for 4 days. Start taking on: November 09, 2022   carvedilol 6.25 MG tablet Commonly known as: COREG Take 1 tablet (6.25 mg total) by mouth 2 (two) times daily with a meal.   cefdinir 300 MG capsule Commonly known as: OMNICEF Take 1 capsule (300 mg total) by mouth 2 (two) times daily for 4 days. Start taking on: November 09, 2022   cetirizine 10 MG tablet Commonly known as: ZYRTEC Take 10 mg by mouth daily.   empagliflozin 25 MG Tabs tablet Commonly known as: JARDIANCE TAKE ONE-HALF TABLET BY MOUTH EVERY DAY FOR CHRONIC HEART FAILURE   feeding supplement Liqd Take 237 mLs by mouth 2 (two) times daily between meals.   fluticasone 50 MCG/ACT nasal spray Commonly known as: FLONASE Place 2 sprays into both nostrils daily as needed for allergies.   hydrocortisone 25 MG suppository Commonly known as: ANUSOL-HC Place 1 suppository (25 mg total) rectally 2 (two) times daily as needed for hemorrhoids or anal itching.   magnesium oxide 400 (240 Mg) MG tablet Commonly known as: MAG-OX Take 1 tablet (400 mg total) by mouth 2 (two) times daily.   ofloxacin 0.3 % OTIC solution Commonly known as: Floxin Otic Place 5 drops into the right ear daily.   omeprazole 40 MG capsule Commonly known as: PRILOSEC Take 1 capsule (40 mg total) by mouth daily.   rosuvastatin 10 MG tablet Commonly known as:  CRESTOR Take 1 tablet (10 mg total) by mouth daily.   sacubitril-valsartan 24-26 MG Commonly known as: ENTRESTO Take 1 tablet by mouth 2 (two) times daily. Start taking on: November 09, 2022   thiamine 100 MG tablet Commonly known as: Vitamin B-1 Take 1 tablet (100 mg total) by mouth daily.   traZODone 50 MG tablet Commonly known as: DESYREL Take 50 mg by mouth at bedtime as needed.        Discharge Assessment: Vitals:   11/07/22 2012 11/08/22 0309  BP: 114/70 111/67  Pulse: 83 84  Resp: 20 18  Temp: 98.2 F (36.8 C) 98.4 F (36.9 C)  SpO2: 100% 99%   Skin clean, dry and intact without evidence of skin break down, no evidence of skin tears noted. IV catheter discontinued intact. Site without signs and symptoms of complications - no redness or edema noted at insertion site, patient denies c/o pain - only slight tenderness at site.  Dressing with slight pressure applied.  D/c Instructions-Education: Discharge instructions given to patient/family with verbalized understanding. D/c education completed with patient/family including follow up instructions, medication list, d/c activities limitations if indicated, with other d/c instructions as indicated by MD - patient able to verbalize understanding, all questions fully answered. Patient instructed to return to ED, call 911, or call MD for any changes in condition.  Patient escorted via WC, and D/C home via private auto.  Laurena Spies, RN 11/08/2022 9:57 AM

## 2022-11-11 ENCOUNTER — Telehealth: Payer: Self-pay

## 2022-11-11 NOTE — Transitions of Care (Post Inpatient/ED Visit) (Signed)
11/11/2022  Name: Jonathan Benitez MRN: [id number] DOB: 08-21-1950  Today's TOC FU Call Status: Today's TOC FU Call Status:: Successful TOC FU Call Competed TOC FU Call Complete Date: 11/11/22  Transition Care Management Follow-up Telephone Call Date of Discharge: 11/08/22 [facility] Type of Discharge: Inpatient Admission Primary Inpatient Discharge Diagnosis:: kidney failure How have you been since you were released from the hospital?: Better Any questions or concerns?: No  Items Reviewed: Did you receive and understand the discharge instructions provided?: Yes Medications obtained,verified, and reconciled?: Yes (Medications Reviewed) Any new allergies since your discharge?: No Dietary orders reviewed?: Yes Do you have support at home?: No  Medications Reviewed Today: Medications Reviewed Today     Reviewed by Karena Addison, LPN (Licensed Practical Nurse) on 11/11/22 at 1500  Med List Status: <None>   Medication Order Taking? Sig Documenting Provider Last Dose Status Informant  allopurinol (ZYLOPRIM) 100 MG tablet [id number]  TAKE ONE TABLET BY MOUTH EVERY DAY TO PREVENT GOUT [provider]  Active   azithromycin (ZITHROMAX) 500 MG tablet [id number]  Take 1 tablet (500 mg total) by mouth daily for 4 days. [name], [name], DO  Active   carvedilol (COREG) 6.25 MG tablet [id number] No Take 1 tablet (6.25 mg total) by mouth 2 (two) times daily with a meal. [name], MD Taking Active   cefdinir (OMNICEF) 300 MG capsule [id number]  Take 1 capsule (300 mg total) by mouth 2 (two) times daily for 4 days. [name], [name], DO  Active   cetirizine (ZYRTEC) 10 MG tablet [id number] No Take 10 mg by mouth daily. [provider] Taking Active Self           Med Note [name] Sep 13, 2021 12:38 PM)    empagliflozin (JARDIANCE) 25 MG TABS tablet [id number] No TAKE ONE-HALF TABLET BY MOUTH EVERY DAY FOR CHRONIC HEART FAILURE [provider] Taking Active   feeding supplement (ENSURE ENLIVE / ENSURE PLUS) LIQD [id number] No Take 237 mLs by mouth 2 (two) times daily between meals. [name], MD Taking Active   fluticasone [facility]) 50 MCG/ACT nasal spray [id number] No Place 2 sprays into both nostrils daily as needed for allergies.  [provider] Taking Active Self  hydrocortisone (ANUSOL-HC) 25 MG suppository [id number] No Place 1 suppository (25 mg total) rectally 2 (two) times daily as needed for hemorrhoids or anal itching. [name], MD Taking Active   magnesium oxide (MAG-OX) 400 (240 Mg) MG tablet [id number] No Take 1 tablet (400 mg total) by mouth 2 (two) times daily. [name], MD Taking Active   ofloxacin (FLOXIN OTIC) 0.3 % OTIC solution [id number] No Place 5 drops into the right ear daily. [name], MD Taking Active   omeprazole (PRILOSEC) 40 MG capsule [id number] No Take 1 capsule (40 mg total) by mouth daily. [name], MD Taking Active   rosuvastatin (CRESTOR) 10 MG tablet [id number] No Take 1 tablet (10 mg total) by mouth daily. [name], MD Taking Active   sacubitril-valsartan [facility]) 24-26 MG [id number]  Take 1 tablet by mouth 2 (two) times daily. [name], [name], DO  Active   thiamine (VITAMIN B-1) 100 MG tablet [id number] No Take 1 tablet (100 mg total) by mouth daily. [name], MD Taking Active   traZODone (DESYREL) 50 MG tablet [id number] No Take 50 mg by mouth at bedtime as needed. [provider] Taking Active   Med List Note [name], [name]  C, CPhT 06/28/12 1410): Gets medications filled at the [place] in [facility].            [facility] and Equipment/Supplies: Were Home Health Services Ordered?: NA Any new equipment or medical supplies ordered?: NA  Functional Questionnaire: Do you need assistance with bathing/showering or dressing?: No Do you need assistance with meal preparation?: No Do you need assistance with eating?: No Do you have  difficulty maintaining continence: No Do you need assistance with getting out of bed/getting out of a chair/moving?: No Do you have difficulty managing or taking your medications?: No  Follow up appointments reviewed: PCP Follow-up appointment confirmed?: Yes Date of PCP follow-up appointment?: 11/12/22 Follow-up Provider: [facility] Follow-up appointment confirmed?: NA Do you need transportation to your follow-up appointment?: No Do you understand care options if your condition(s) worsen?: Yes-patient verbalized understanding    SIGNATURE Karena Addison, LPN [facility] Nurse Health Advisor Direct Dial [phone]

## 2022-11-12 ENCOUNTER — Ambulatory Visit (INDEPENDENT_AMBULATORY_CARE_PROVIDER_SITE_OTHER): Payer: Self-pay | Admitting: Internal Medicine

## 2022-11-12 ENCOUNTER — Encounter: Payer: Self-pay | Admitting: Internal Medicine

## 2022-11-12 VITALS — BP 136/88 | HR 104 | Ht 72.0 in | Wt 192.4 lb

## 2022-11-12 DIAGNOSIS — Z09 Encounter for follow-up examination after completed treatment for conditions other than malignant neoplasm: Secondary | ICD-10-CM | POA: Diagnosis not present

## 2022-11-12 DIAGNOSIS — K551 Chronic vascular disorders of intestine: Secondary | ICD-10-CM

## 2022-11-12 DIAGNOSIS — N179 Acute kidney failure, unspecified: Secondary | ICD-10-CM | POA: Diagnosis not present

## 2022-11-12 DIAGNOSIS — I255 Ischemic cardiomyopathy: Secondary | ICD-10-CM | POA: Diagnosis not present

## 2022-11-12 DIAGNOSIS — J189 Pneumonia, unspecified organism: Secondary | ICD-10-CM | POA: Diagnosis not present

## 2022-11-12 DIAGNOSIS — I10 Essential (primary) hypertension: Secondary | ICD-10-CM

## 2022-11-12 MED ORDER — CARVEDILOL 6.25 MG PO TABS: 6.2500 mg | ORAL_TABLET | Freq: Two times a day (BID) | ORAL | 1 refills | Status: DC

## 2022-11-12 NOTE — Assessment & Plan Note (Signed)
AKI likely due to dehydration from poor p.o. intake Improved with IV fluid during recent hospitalization Last BMP showed  improvement in serum creatinine and GFR Recheck BMP Needs to maintain at least 50 ounces of fluid intake in a day

## 2022-11-12 NOTE — Assessment & Plan Note (Signed)
Was treated with Rocephin and azithromycin in the hospital, was later switched to oral cefdinir and azithromycin Repeat chest x-ray after 6 weeks

## 2022-11-12 NOTE — Assessment & Plan Note (Signed)
Hospital chart reviewed, including discharge summary Medications reconciled and reviewed with the patient in detail 

## 2022-11-12 NOTE — Progress Notes (Signed)
Established Patient Office Visit  Subjective:  Patient ID: Jonathan Benitez, male    DOB: 1950-12-06  Age: 72 y.o. MRN: [id number]  CC:  Chief Complaint  Patient presents with   Hospitalization Follow-up    Hospital Follow up     HPI Jonathan Benitez is a 72 y.o. male with past medical history of HTN, ischemic cardiomyopathy  prostate cancer s/p radiotherapy, GERD, gouty arthritis and SMA stenosis who presents for follow-up after recent hospitalization from 11/07/22-11/08/22.  He presented to the ED for generalized weakness and decreased food and water intake. Was hypotensive on arrival to 84/61, received 1L IVF bolus x2, and BP improved to 111/89. Labs significant for AKI with Cr 4.23 (baseline around 1.0), BUN 54, Lactate 2.5 and low electrolytes (Na 127, K 2.8, Cl 93). CT Renal study remarkable for significant vascular calcifications with stenosis of SMA and Celiac arteries, moderate hiatal hernia, 4mm lung nodule (no tobacco abuse hx). CXR revealed hazy opacity in left lower base c/f pna.   After volume resuscitation, Jonathan Benitez's Cr improved from 4.23 to 1.36, and his electrolytes normalized. For his pneumonia, he was started on Rocephin and Azithromycin on 6/28, and was switched to oral antibiotic course till 7/2 (Cefdinir and Azithromycin).  He reports improvement in her fatigue now.  Denies any fever, chills, cough, dyspnea or wheezing currently.  He is taking cefdinir and azithromycin.  He has increased fluid intake to about 48 ounces of water in a day.  His BP was slightly elevated with tachycardia today, but he has run out of Coreg recently as it is not in his medication box.  He denies any chest pain or palpitations currently.    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    Past Surgical History:  Procedure Laterality Date    COLONOSCOPY N/A 03/30/2018   Procedure: COLONOSCOPY;  Surgeon: Malissa Hippo, MD;  Location: [facility];  Service: Endoscopy;  Laterality: N/A;  12:45   COLONOSCOPY WITH PROPOFOL N/A 01/23/2022   Procedure: COLONOSCOPY WITH PROPOFOL;  Surgeon: Lanelle Bal, DO;  Location: [facility];  Service: Endoscopy;  Laterality: N/A;   HERNIA REPAIR     [facility]   POLYPECTOMY  03/30/2018   Procedure: POLYPECTOMY;  Surgeon: Malissa Hippo, MD;  Location: [facility];  Service: Endoscopy;;  colon   PROSTATE BIOPSY     positive for cancer   TOTAL HIP ARTHROPLASTY Left    [facility] 11/07/17 Dr. Nancy Fetter    Family History  Problem Relation Age of Onset   Hypertension Mother    Hypertension Father    Stroke Father    Colon cancer Neg Hx    Colon polyps Neg Hx     Social History   Socioeconomic History   Marital status: Widowed    Spouse name: Not on file   Number of children: Not on file   Years of education: Not on file   Highest education level: Not on file  Occupational History   Not on file  Tobacco Use   Smoking status: Never   Smokeless tobacco: Never  Vaping Use   Vaping Use: Never used  Substance and Sexual Activity   Alcohol use: Yes    Comment: was drinking 3-4 beers a day  Drug use: Yes    Types: Marijuana    Comment: Smokes a joint every day   Sexual activity: Not on file  Other Topics Concern   Not on file  Social History Narrative   Not on file   Social Determinants of Health   Financial Resource Strain: Medium Risk (03/26/2021)   Overall Financial Resource Strain (CARDIA)    Difficulty of Paying Living Expenses: Somewhat hard  Food Insecurity: No Food Insecurity (11/07/2022)   Hunger Vital Sign    Worried About Running Out of Food in the Last Year: Never true    Ran Out of Food in the Last Year: Never true  Transportation Needs: No Transportation Needs (11/07/2022)   PRAPARE - Administrator, Civil Service (Medical): No     Lack of Transportation (Non-Medical): No  Physical Activity: Insufficiently Active (03/26/2021)   Exercise Vital Sign    Days of Exercise per Week: 7 days    Minutes of Exercise per Session: 20 min  Stress: No Stress Concern Present (03/26/2021)   [employer] of Occupational Health - Occupational Stress Questionnaire    Feeling of Stress : Not at all  Social Connections: Socially Isolated (03/26/2021)   Social Connection and Isolation Panel [NHANES]    Frequency of Communication with Friends and Family: More than three times a week    Frequency of Social Gatherings with Friends and Family: More than three times a week    Attends Religious Services: Never    Database administrator or Organizations: No    Attends Banker Meetings: Never    Marital Status: Divorced  Catering manager Violence: Not At Risk (11/07/2022)   Humiliation, Afraid, Rape, and Kick questionnaire    Fear of Current or Ex-Partner: No    Emotionally Abused: No    Physically Abused: No    Sexually Abused: No    Outpatient Medications Prior to Visit  Medication Sig Dispense Refill   allopurinol (ZYLOPRIM) 100 MG tablet TAKE ONE TABLET BY MOUTH EVERY DAY TO PREVENT GOUT     azithromycin (ZITHROMAX) 500 MG tablet Take 1 tablet (500 mg total) by mouth daily for 4 days. 4 tablet 0   cefdinir (OMNICEF) 300 MG capsule Take 1 capsule (300 mg total) by mouth 2 (two) times daily for 4 days. 8 capsule 0   cetirizine (ZYRTEC) 10 MG tablet Take 10 mg by mouth daily.     empagliflozin (JARDIANCE) 25 MG TABS tablet TAKE ONE-HALF TABLET BY MOUTH EVERY DAY FOR CHRONIC HEART FAILURE     feeding supplement (ENSURE ENLIVE / ENSURE PLUS) LIQD Take 237 mLs by mouth 2 (two) times daily between meals.     fluticasone (FLONASE) 50 MCG/ACT nasal spray Place 2 sprays into both nostrils daily as needed for allergies.      hydrocortisone (ANUSOL-HC) 25 MG suppository Place 1 suppository (25 mg total) rectally 2 (two) times daily  as needed for hemorrhoids or anal itching. 12 suppository 0   magnesium oxide (MAG-OX) 400 (240 Mg) MG tablet Take 1 tablet (400 mg total) by mouth 2 (two) times daily. 30 tablet 1   ofloxacin (FLOXIN OTIC) 0.3 % OTIC solution Place 5 drops into the right ear daily. 5 mL 0   omeprazole (PRILOSEC) 40 MG capsule Take 1 capsule (40 mg total) by mouth daily. 30 capsule 3   rosuvastatin (CRESTOR) 10 MG tablet Take 1 tablet (10 mg total) by mouth daily. 30 tablet 5   sacubitril-valsartan (ENTRESTO) 24-26  MG Take 1 tablet by mouth 2 (two) times daily. 60 tablet 2   thiamine (VITAMIN B-1) 100 MG tablet Take 1 tablet (100 mg total) by mouth daily. 30 tablet 2   traZODone (DESYREL) 50 MG tablet Take 50 mg by mouth at bedtime as needed.     carvedilol (COREG) 6.25 MG tablet Take 1 tablet (6.25 mg total) by mouth 2 (two) times daily with a meal. 60 tablet 1   No facility-administered medications prior to visit.    Allergies  Allergen Reactions   Nsaids Other (See Comments)     Anemia, Gastrointestinal hemorrhage    ROS Review of Systems  Constitutional:  Negative for chills and fever.  HENT:  Negative for congestion, sinus pressure and sinus pain.   Respiratory:  Negative for cough and shortness of breath.   Gastrointestinal:  Negative for abdominal pain, diarrhea, nausea and vomiting.  Genitourinary:  Negative for dysuria and hematuria.  Musculoskeletal:  Negative for neck pain and neck stiffness.  Skin:  Negative for rash.  Neurological:  Negative for dizziness and weakness.  Psychiatric/Behavioral:  Positive for sleep disturbance. Negative for agitation and behavioral problems.       Objective:    Physical Exam Vitals reviewed.  Constitutional:      General: He is not in acute distress.    Appearance: He is not diaphoretic.  HENT:     Head: Normocephalic and atraumatic.     Nose: Nose normal.     Mouth/Throat:     Mouth: Mucous membranes are moist.  Eyes:     General: No scleral  icterus.    Extraocular Movements: Extraocular movements intact.  Cardiovascular:     Rate and Rhythm: Normal rate and regular rhythm.     Pulses: Normal pulses.     Heart sounds: Normal heart sounds. No murmur heard. Pulmonary:     Breath sounds: Normal breath sounds. No wheezing or rales.  Musculoskeletal:     Cervical back: Neck supple. No tenderness.     Right lower leg: No edema.     Left lower leg: No edema.     Right foot: Bunion present.  Skin:    General: Skin is warm.     Findings: No rash.  Neurological:     General: No focal deficit present.     Mental Status: He is alert and oriented to person, place, and time.     Sensory: No sensory deficit.     Motor: No weakness.  Psychiatric:        Mood and Affect: Mood normal.        Behavior: Behavior normal.     BP 136/88 (BP Location: Left Arm)   Pulse (!) 104   Ht 6' (1.829 m)   Wt 192 lb 6.4 oz (87.3 kg)   SpO2 97%   BMI 26.09 kg/m  Wt Readings from Last 3 Encounters:  11/12/22 192 lb 6.4 oz (87.3 kg)  11/07/22 191 lb (86.6 kg)  03/27/22 184 lb (83.5 kg)    Lab Results  Component Value Date   TSH 1.092 11/07/2022   Lab Results  Component Value Date   WBC 4.4 11/08/2022   HGB 11.3 (L) 11/08/2022   HCT 34.2 (L) 11/08/2022   MCV 99.4 11/08/2022   PLT 167 11/08/2022   Lab Results  Component Value Date   NA 136 11/08/2022   K 3.7 11/08/2022   CO2 18 (L) 11/08/2022   GLUCOSE 101 (H) 11/08/2022   BUN 35 (  H) 11/08/2022   CREATININE 1.36 (H) 11/08/2022   BILITOT 0.6 01/23/2022   ALKPHOS 43 01/23/2022   AST 23 01/23/2022   ALT 21 01/23/2022   PROT 7.1 01/23/2022   ALBUMIN 3.4 (L) 01/23/2022   CALCIUM 8.0 (L) 11/08/2022   ANIONGAP 10 11/08/2022   EGFR 76 01/30/2022   Lab Results  Component Value Date   CHOL 129 09/07/2020   Lab Results  Component Value Date   HDL 73 09/07/2020   Lab Results  Component Value Date   LDLCALC 42 09/07/2020   Lab Results  Component Value Date   TRIG 69  09/07/2020   Lab Results  Component Value Date   CHOLHDL 1.8 09/07/2020   Lab Results  Component Value Date   HGBA1C 4.6 (L) 09/07/2020      Assessment & Plan:   Problem List Items Addressed This Visit       Cardiovascular and Mediastinum   HTN (hypertension)    BP Readings from Last 1 Encounters:  11/12/22 136/88  Was well-controlled with Coreg and Entresto Was slightly elevated with tachycardia today as he has run out of Coreg - refilled, usually gets it through [facility] for compliance with the medications Advised DASH diet and moderate exercise/walking as tolerated      Relevant Medications   carvedilol (COREG) 6.25 MG tablet   Other Relevant Orders   CBC with Differential/Platelet   Cardiomyopathy (HCC)    Echo showed LVEF of 45 to 50% with wall motion abnormalities Had exertional dyspnea Was placed on Coreg, Entresto and Jardiance from [place] [facility], has run out of Coreg, refilled Outpatient follow-up with cardiology      Relevant Medications   carvedilol (COREG) 6.25 MG tablet   Other Relevant Orders   CBC with Differential/Platelet   Superior mesenteric artery stenosis (HCC)    Had chronic abdominal pain and lack of appetite, now resolved Referred to vascular surgery - since he had resolution of abdominal pain and lack of appetite, was advised watchful observation for now Needs to take Crestor regularly      Relevant Medications   carvedilol (COREG) 6.25 MG tablet     Respiratory   CAP (community acquired pneumonia)    Was treated with Rocephin and azithromycin in the hospital, was later switched to oral cefdinir and azithromycin Repeat chest x-ray after 6 weeks      Relevant Orders   CBC with Differential/Platelet   DG Chest 2 View     Genitourinary   AKI (acute kidney injury) (HCC) - Primary    AKI likely due to dehydration from poor p.o. intake Improved with IV fluid during recent hospitalization Last BMP showed  improvement in serum  creatinine and GFR Recheck BMP Needs to maintain at least 50 ounces of fluid intake in a day       Relevant Orders   CBC with Differential/Platelet   Basic Metabolic Panel (BMET)     Other   Hospital discharge follow-up    Hospital chart reviewed, including discharge summary Medications reconciled and reviewed with the patient in detail       Meds ordered this encounter  Medications   carvedilol (COREG) 6.25 MG tablet    Sig: Take 1 tablet (6.25 mg total) by mouth 2 (two) times daily with a meal.    Dispense:  60 tablet    Refill:  1    Follow-up: Return in about 4 months (around 03/15/2023) for CHF.    [name], MD

## 2022-11-12 NOTE — Assessment & Plan Note (Addendum)
BP Readings from Last 1 Encounters:  11/12/22 136/88   Was well-controlled with Coreg and Entresto Was slightly elevated with tachycardia today as he has run out of Coreg - refilled, usually gets it through [place] [facility] for compliance with the medications Advised DASH diet and moderate exercise/walking as tolerated

## 2022-11-12 NOTE — Assessment & Plan Note (Addendum)
Echo showed LVEF of 45 to 50% with wall motion abnormalities Had exertional dyspnea Was placed on Coreg, Entresto and Jardiance from [place] [facility], has run out of Coreg, refilled Outpatient follow-up with cardiology

## 2022-11-12 NOTE — Patient Instructions (Signed)
Please start taking Carvedilol as prescribed.  Please get chest x-ray in the second week of August.  Please maintain at least 50 ounces of fluid in a day.  Please continue to take medications as prescribed.  Please continue to follow low salt diet and ambulate as tolerated.

## 2022-11-12 NOTE — Assessment & Plan Note (Signed)
Had chronic abdominal pain and lack of appetite, now resolved Referred to vascular surgery - since he had resolution of abdominal pain and lack of appetite, was advised watchful observation for now Needs to take Crestor regularly

## 2022-11-13 LAB — CBC WITH DIFFERENTIAL/PLATELET
Basophils Absolute: 0.1 10*3/uL (ref 0.0–0.2)
Basos: 1 %
EOS (ABSOLUTE): 0.1 10*3/uL (ref 0.0–0.4)
Eos: 1 %
Hematocrit: 38.6 % (ref 37.5–51.0)
Hemoglobin: 13 g/dL (ref 13.0–17.7)
Immature Grans (Abs): 0 10*3/uL (ref 0.0–0.1)
Immature Granulocytes: 0 %
Lymphocytes Absolute: 1.7 10*3/uL (ref 0.7–3.1)
Lymphs: 25 %
MCH: 32.1 pg (ref 26.6–33.0)
MCHC: 33.7 g/dL (ref 31.5–35.7)
MCV: 95 fL (ref 79–97)
Monocytes Absolute: 1 10*3/uL — ABNORMAL HIGH (ref 0.1–0.9)
Monocytes: 15 %
Neutrophils Absolute: 3.9 10*3/uL (ref 1.4–7.0)
Neutrophils: 58 %
Platelets: 233 10*3/uL (ref 150–450)
RBC: 4.05 x10E6/uL — ABNORMAL LOW (ref 4.14–5.80)
RDW: 13 % (ref 11.6–15.4)
WBC: 6.8 10*3/uL (ref 3.4–10.8)

## 2022-11-13 LAB — BASIC METABOLIC PANEL
BUN/Creatinine Ratio: 10 (ref 10–24)
BUN: 10 mg/dL (ref 8–27)
CO2: 20 mmol/L (ref 20–29)
Calcium: 10 mg/dL (ref 8.6–10.2)
Chloride: 103 mmol/L (ref 96–106)
Creatinine, Ser: 0.96 mg/dL (ref 0.76–1.27)
Glucose: 86 mg/dL (ref 70–99)
Potassium: 4.2 mmol/L (ref 3.5–5.2)
Sodium: 141 mmol/L (ref 134–144)
eGFR: 84 mL/min/{1.73_m2} (ref >59)

## 2023-01-31 ENCOUNTER — Ambulatory Visit (INDEPENDENT_AMBULATORY_CARE_PROVIDER_SITE_OTHER): Payer: Self-pay | Admitting: Internal Medicine

## 2023-01-31 ENCOUNTER — Encounter: Payer: Self-pay | Admitting: Internal Medicine

## 2023-01-31 VITALS — BP 134/82 | HR 106 | Ht 72.0 in | Wt 195.2 lb

## 2023-01-31 DIAGNOSIS — I10 Essential (primary) hypertension: Secondary | ICD-10-CM

## 2023-01-31 DIAGNOSIS — C61 Malignant neoplasm of prostate: Secondary | ICD-10-CM

## 2023-01-31 DIAGNOSIS — M109 Gout, unspecified: Secondary | ICD-10-CM

## 2023-01-31 DIAGNOSIS — N401 Enlarged prostate with lower urinary tract symptoms: Secondary | ICD-10-CM | POA: Diagnosis not present

## 2023-01-31 DIAGNOSIS — R739 Hyperglycemia, unspecified: Secondary | ICD-10-CM

## 2023-01-31 DIAGNOSIS — E782 Mixed hyperlipidemia: Secondary | ICD-10-CM | POA: Insufficient documentation

## 2023-01-31 DIAGNOSIS — R351 Nocturia: Secondary | ICD-10-CM

## 2023-01-31 MED ORDER — COLCHICINE 0.6 MG PO TABS
ORAL_TABLET | ORAL | 0 refills | Status: DC
Start: 2023-01-31 — End: 2023-04-08

## 2023-01-31 MED ORDER — TAMSULOSIN HCL 0.4 MG PO CAPS
0.4000 mg | ORAL_CAPSULE | Freq: Every day | ORAL | 5 refills | Status: DC
Start: 2023-01-31 — End: 2023-11-19

## 2023-01-31 NOTE — Patient Instructions (Signed)
Please start taking Colchicine as prescribed.  Please start taking Tamsulosin as prescribed for nocturia.

## 2023-01-31 NOTE — Progress Notes (Signed)
Acute Office Visit  Subjective:    Patient ID: Jonathan Benitez, male    DOB: Nov 09, 1950, 72 y.o.   MRN: [id number]  Chief Complaint  Patient presents with   Gout    Gout flare up     HPI Patient is in today for episode of gout flareup in the left > right foot for the last 1 week.  He had swelling of the left great toe and foot in general as well.  He currently takes allopurinol for gout.  He reports that he had steak about a week ago, which caused gout flareup.  Denies any alcohol intake recently.  He also reports nocturia.  Denies any dysuria or hematuria.  He has history of prostate cancer, s/p radiation.  His PSA has been WNL since then.  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    Past Surgical History:  Procedure Laterality Date   COLONOSCOPY N/A 03/30/2018   Procedure: COLONOSCOPY;  Surgeon: Malissa Hippo, MD;  [facility];  Service: Endoscopy;  Laterality: N/A;  12:45   COLONOSCOPY WITH PROPOFOL N/A 01/23/2022   Procedure: COLONOSCOPY WITH PROPOFOL;  Surgeon: Lanelle Bal, DO;  Location: [facility];  Service: Endoscopy;  Laterality: N/A;   HERNIA REPAIR     [facility]   POLYPECTOMY  03/30/2018   Procedure: POLYPECTOMY;  Surgeon: Malissa Hippo, MD;  Location: [facility];  Service: Endoscopy;;  colon   PROSTATE BIOPSY     positive for cancer   TOTAL HIP ARTHROPLASTY Left    [facility] 11/07/17 Dr. Nancy Fetter    Family History  Problem Relation Age of Onset   Hypertension Mother    Hypertension Father    Stroke Father    Colon cancer Neg Hx    Colon polyps Neg Hx     Social History   Socioeconomic History   Marital status: Widowed    Spouse name: Not on file   Number of children: Not on file   Years of education: Not on file   Highest education level: Not on file   Occupational History   Not on file  Tobacco Use   Smoking status: Never   Smokeless tobacco: Never  Vaping Use   Vaping status: Never Used  Substance and Sexual Activity   Alcohol use: Yes    Comment: was drinking 3-4 beers a day   Drug use: Yes    Types: Marijuana    Comment: Smokes a joint every day   Sexual activity: Not on file  Other Topics Concern   Not on file  Social History Narrative   Not on file   Social Determinants of Health   Financial Resource Strain: Medium Risk (03/26/2021)   Overall Financial Resource Strain (CARDIA)    Difficulty of Paying Living Expenses: Somewhat hard  Food Insecurity: No Food Insecurity (11/07/2022)   Hunger Vital Sign    Worried About Running Out of Food in the Last Year: Never true    Ran Out of Food in the Last Year: Never true  Transportation Needs: No Transportation Needs (11/07/2022)   PRAPARE - Administrator, Civil Service (Medical): No    Lack of Transportation (Non-Medical): No  Physical Activity: Insufficiently Active (03/26/2021)   Exercise Vital Sign  Days of Exercise per Week: 7 days    Minutes of Exercise per Session: 20 min  Stress: No Stress Concern Present (03/26/2021)   [employer] of Occupational Health - Occupational Stress Questionnaire    Feeling of Stress : Not at all  Social Connections: Socially Isolated (03/26/2021)   Social Connection and Isolation Panel [NHANES]    Frequency of Communication with Friends and Family: More than three times a week    Frequency of Social Gatherings with Friends and Family: More than three times a week    Attends Religious Services: Never    Database administrator or Organizations: No    Attends Banker Meetings: Never    Marital Status: Divorced  Catering manager Violence: Not At Risk (11/07/2022)   Humiliation, Afraid, Rape, and Kick questionnaire    Fear of Current or Ex-Partner: No    Emotionally Abused: No    Physically Abused: No     Sexually Abused: No    Outpatient Medications Prior to Visit  Medication Sig Dispense Refill   allopurinol (ZYLOPRIM) 100 MG tablet TAKE ONE TABLET BY MOUTH EVERY DAY TO PREVENT GOUT     carvedilol (COREG) 6.25 MG tablet Take 1 tablet (6.25 mg total) by mouth 2 (two) times daily with a meal. 60 tablet 1   cetirizine (ZYRTEC) 10 MG tablet Take 10 mg by mouth daily.     empagliflozin (JARDIANCE) 25 MG TABS tablet TAKE ONE-HALF TABLET BY MOUTH EVERY DAY FOR CHRONIC HEART FAILURE     feeding supplement (ENSURE ENLIVE / ENSURE PLUS) LIQD Take 237 mLs by mouth 2 (two) times daily between meals.     fluticasone (FLONASE) 50 MCG/ACT nasal spray Place 2 sprays into both nostrils daily as needed for allergies.      hydrocortisone (ANUSOL-HC) 25 MG suppository Place 1 suppository (25 mg total) rectally 2 (two) times daily as needed for hemorrhoids or anal itching. 12 suppository 0   magnesium oxide (MAG-OX) 400 (240 Mg) MG tablet Take 1 tablet (400 mg total) by mouth 2 (two) times daily. 30 tablet 1   omeprazole (PRILOSEC) 40 MG capsule Take 1 capsule (40 mg total) by mouth daily. 30 capsule 3   rosuvastatin (CRESTOR) 10 MG tablet Take 1 tablet (10 mg total) by mouth daily. 30 tablet 5   sacubitril-valsartan (ENTRESTO) 24-26 MG Take 1 tablet by mouth 2 (two) times daily. 60 tablet 2   thiamine (VITAMIN B-1) 100 MG tablet Take 1 tablet (100 mg total) by mouth daily. 30 tablet 2   traZODone (DESYREL) 50 MG tablet Take 50 mg by mouth at bedtime as needed.     ofloxacin (FLOXIN OTIC) 0.3 % OTIC solution Place 5 drops into the right ear daily. 5 mL 0   No facility-administered medications prior to visit.    Allergies  Allergen Reactions   Nsaids Other (See Comments)     Anemia, Gastrointestinal hemorrhage    Review of Systems  Constitutional:  Negative for chills and fever.  HENT:  Negative for congestion, sinus pressure and sinus pain.   Eyes:  Negative for pain and discharge.  Respiratory:   Negative for cough and shortness of breath.   Gastrointestinal:  Negative for abdominal pain, diarrhea, nausea and vomiting.  Genitourinary:  Negative for dysuria and hematuria.  Musculoskeletal:  Negative for neck pain and neck stiffness.       L > R foot swelling  Skin:  Negative for rash.  Neurological:  Negative for dizziness and  weakness.  Psychiatric/Behavioral:  Positive for sleep disturbance. Negative for agitation and behavioral problems.        Objective:    Physical Exam Vitals reviewed.  Constitutional:      General: He is not in acute distress.    Appearance: He is not diaphoretic.  HENT:     Head: Normocephalic and atraumatic.     Nose: Nose normal.     Mouth/Throat:     Mouth: Mucous membranes are moist.  Eyes:     General: No scleral icterus.    Extraocular Movements: Extraocular movements intact.  Cardiovascular:     Rate and Rhythm: Normal rate and regular rhythm.     Pulses: Normal pulses.     Heart sounds: Normal heart sounds. No murmur heard. Pulmonary:     Breath sounds: Normal breath sounds. No wheezing or rales.  Musculoskeletal:     Cervical back: Neck supple. No tenderness.     Right lower leg: No edema.     Left lower leg: No edema.     Right foot: Bunion present.  Feet:     Comments: L > R foot swelling Skin:    General: Skin is warm.     Findings: No rash.  Neurological:     General: No focal deficit present.     Mental Status: He is alert and oriented to person, place, and time.     Sensory: No sensory deficit.     Motor: No weakness.  Psychiatric:        Mood and Affect: Mood normal.        Behavior: Behavior normal.     BP 134/82 (BP Location: Left Arm)   Pulse (!) 106   Ht 6' (1.829 m)   Wt 195 lb 3.2 oz (88.5 kg)   SpO2 97%   BMI 26.47 kg/m  Wt Readings from Last 3 Encounters:  01/31/23 195 lb 3.2 oz (88.5 kg)  11/12/22 192 lb 6.4 oz (87.3 kg)  11/07/22 191 lb (86.6 kg)        Assessment & Plan:   Problem List  Items Addressed This Visit       Cardiovascular and Mediastinum   HTN (hypertension)    BP Readings from Last 1 Encounters:  01/31/23 134/82   Well-controlled with Coreg and Entresto Counseled for compliance with the medications Advised DASH diet and moderate exercise/walking as tolerated        Musculoskeletal and Integument   Acute gout of left foot - Primary    Started Colchicine for acute flareup Continue allopurinol Avoid alcohol Avoid red meat products Dietary recommendations provided      Relevant Medications   colchicine 0.6 MG tablet     Genitourinary   Prostate cancer (HCC)    S/p radiation therapy Has nocturia, started Tamsulosin Gets PSA check at [facility]      Relevant Medications   colchicine 0.6 MG tablet   Other Relevant Orders   CMP14+EGFR   PSA   CBC with Differential/Platelet     Other   Benign prostatic hyperplasia with nocturia    Has nocturia, likely due to BPH Check PSA Started Flomax 0.4 mg qHS      Relevant Medications   tamsulosin (FLOMAX) 0.4 MG CAPS capsule   Other Relevant Orders   PSA   Mixed hyperlipidemia    On Crestor      Relevant Orders   Lipid Profile   Other Visit Diagnoses     Hyperglycemia  Relevant Orders   Hemoglobin A1c        Meds ordered this encounter  Medications   colchicine 0.6 MG tablet    Sig: Take 2 tablets once, then 1 tablet 1 hour later. Take 1 tablet once daily by mouth from Day 2.    Dispense:  20 tablet    Refill:  0   tamsulosin (FLOMAX) 0.4 MG CAPS capsule    Sig: Take 1 capsule (0.4 mg total) by mouth daily.    Dispense:  30 capsule    Refill:  5     Braeleigh Pyper Concha Se, MD

## 2023-01-31 NOTE — Assessment & Plan Note (Signed)
Started Colchicine for acute flareup Continue allopurinol Avoid alcohol Avoid red meat products Dietary recommendations provided

## 2023-01-31 NOTE — Assessment & Plan Note (Signed)
BP Readings from Last 1 Encounters:  01/31/23 134/82   Well-controlled with Coreg and Entresto Counseled for compliance with the medications Advised DASH diet and moderate exercise/walking as tolerated

## 2023-01-31 NOTE — Assessment & Plan Note (Addendum)
S/p radiation therapy Has nocturia, started Tamsulosin Gets PSA check at [facility]

## 2023-01-31 NOTE — Assessment & Plan Note (Signed)
On Crestor 

## 2023-01-31 NOTE — Assessment & Plan Note (Signed)
Has nocturia, likely due to BPH Check PSA Started Flomax 0.4 mg qHS

## 2023-02-25 ENCOUNTER — Ambulatory Visit: Payer: No Typology Code available for payment source

## 2023-02-26 LAB — CMP14+EGFR
ALT: 26 [IU]/L (ref 0–44)
AST: 45 [IU]/L — ABNORMAL HIGH (ref 0–40)
Albumin: 4.3 g/dL (ref 3.8–4.8)
Alkaline Phosphatase: 65 [IU]/L (ref 44–121)
BUN/Creatinine Ratio: 9 — ABNORMAL LOW (ref 10–24)
BUN: 9 mg/dL (ref 8–27)
Bilirubin Total: 0.6 mg/dL (ref 0.0–1.2)
CO2: 18 mmol/L — ABNORMAL LOW (ref 20–29)
Calcium: 9.1 mg/dL (ref 8.6–10.2)
Chloride: 104 mmol/L (ref 96–106)
Creatinine, Ser: 1.03 mg/dL (ref 0.76–1.27)
Globulin, Total: 3.5 g/dL (ref 1.5–4.5)
Glucose: 104 mg/dL — ABNORMAL HIGH (ref 70–99)
Potassium: 3.2 mmol/L — ABNORMAL LOW (ref 3.5–5.2)
Sodium: 139 mmol/L (ref 134–144)
Total Protein: 7.8 g/dL (ref 6.0–8.5)
eGFR: 77 mL/min/{1.73_m2} (ref >59)

## 2023-02-26 LAB — CBC WITH DIFFERENTIAL/PLATELET
Basophils Absolute: 0 10*3/uL (ref 0.0–0.2)
Basos: 1 %
EOS (ABSOLUTE): 0.1 10*3/uL (ref 0.0–0.4)
Eos: 2 %
Hematocrit: 39.5 % (ref 37.5–51.0)
Hemoglobin: 12.5 g/dL — ABNORMAL LOW (ref 13.0–17.7)
Immature Grans (Abs): 0 10*3/uL (ref 0.0–0.1)
Immature Granulocytes: 0 %
Lymphocytes Absolute: 1.8 10*3/uL (ref 0.7–3.1)
Lymphs: 32 %
MCH: 31.4 pg (ref 26.6–33.0)
MCHC: 31.6 g/dL (ref 31.5–35.7)
MCV: 99 fL — ABNORMAL HIGH (ref 79–97)
Monocytes Absolute: 0.7 10*3/uL (ref 0.1–0.9)
Monocytes: 12 %
Neutrophils Absolute: 3.1 10*3/uL (ref 1.4–7.0)
Neutrophils: 53 %
Platelets: 167 10*3/uL (ref 150–450)
RBC: 3.98 x10E6/uL — ABNORMAL LOW (ref 4.14–5.80)
RDW: 14.2 % (ref 11.6–15.4)
WBC: 5.8 10*3/uL (ref 3.4–10.8)

## 2023-02-26 LAB — HEMOGLOBIN A1C
Est. average glucose Bld gHb Est-mCnc: 103 mg/dL
Hgb A1c MFr Bld: 5.2 % (ref 4.8–5.6)

## 2023-02-26 LAB — LIPID PANEL
Chol/HDL Ratio: 1.6 {ratio} (ref 0.0–5.0)
Cholesterol, Total: 125 mg/dL (ref 100–199)
HDL: 79 mg/dL (ref >39)
LDL Chol Calc (NIH): 30 mg/dL (ref 0–99)
Triglycerides: 85 mg/dL (ref 0–149)
VLDL Cholesterol Cal: 16 mg/dL (ref 5–40)

## 2023-02-26 LAB — PSA: Prostate Specific Ag, Serum: 0.3 ng/mL (ref 0.0–4.0)

## 2023-03-17 ENCOUNTER — Ambulatory Visit: Payer: No Typology Code available for payment source | Admitting: Internal Medicine

## 2023-03-26 ENCOUNTER — Encounter: Payer: Self-pay | Admitting: Internal Medicine

## 2023-04-07 ENCOUNTER — Ambulatory Visit: Payer: No Typology Code available for payment source | Admitting: Internal Medicine

## 2023-04-08 ENCOUNTER — Other Ambulatory Visit (HOSPITAL_COMMUNITY): Payer: Self-pay | Admitting: *Deleted

## 2023-04-08 ENCOUNTER — Emergency Department (HOSPITAL_COMMUNITY): Payer: No Typology Code available for payment source

## 2023-04-08 ENCOUNTER — Inpatient Hospital Stay (HOSPITAL_COMMUNITY): Payer: No Typology Code available for payment source

## 2023-04-08 ENCOUNTER — Inpatient Hospital Stay (HOSPITAL_COMMUNITY)
Admission: EM | Admit: 2023-04-08 | Discharge: 2023-04-11 | DRG: 287 | Disposition: A | Discharge status: Home / Self Care | Payer: No Typology Code available for payment source | Source: Other Acute Inpatient Hospital | Attending: Internal Medicine | Admitting: Internal Medicine

## 2023-04-08 ENCOUNTER — Other Ambulatory Visit: Payer: Self-pay

## 2023-04-08 ENCOUNTER — Encounter (HOSPITAL_COMMUNITY): Payer: Self-pay

## 2023-04-08 DIAGNOSIS — Z9889 Other specified postprocedural states: Secondary | ICD-10-CM

## 2023-04-08 DIAGNOSIS — Z886 Allergy status to analgesic agent status: Secondary | ICD-10-CM

## 2023-04-08 DIAGNOSIS — Z8719 Personal history of other diseases of the digestive system: Secondary | ICD-10-CM

## 2023-04-08 DIAGNOSIS — E860 Dehydration: Secondary | ICD-10-CM | POA: Diagnosis present

## 2023-04-08 DIAGNOSIS — R079 Chest pain, unspecified: Secondary | ICD-10-CM

## 2023-04-08 DIAGNOSIS — E871 Hypo-osmolality and hyponatremia: Secondary | ICD-10-CM | POA: Diagnosis present

## 2023-04-08 DIAGNOSIS — F1991 Other psychoactive substance use, unspecified, in remission: Secondary | ICD-10-CM | POA: Diagnosis present

## 2023-04-08 DIAGNOSIS — K219 Gastro-esophageal reflux disease without esophagitis: Secondary | ICD-10-CM | POA: Diagnosis present

## 2023-04-08 DIAGNOSIS — R627 Adult failure to thrive: Secondary | ICD-10-CM | POA: Diagnosis present

## 2023-04-08 DIAGNOSIS — E782 Mixed hyperlipidemia: Secondary | ICD-10-CM | POA: Diagnosis present

## 2023-04-08 DIAGNOSIS — N401 Enlarged prostate with lower urinary tract symptoms: Secondary | ICD-10-CM | POA: Diagnosis present

## 2023-04-08 DIAGNOSIS — R7989 Other specified abnormal findings of blood chemistry: Secondary | ICD-10-CM | POA: Diagnosis present

## 2023-04-08 DIAGNOSIS — K5090 Crohn's disease, unspecified, without complications: Secondary | ICD-10-CM | POA: Diagnosis present

## 2023-04-08 DIAGNOSIS — Z8739 Personal history of other diseases of the musculoskeletal system and connective tissue: Secondary | ICD-10-CM

## 2023-04-08 DIAGNOSIS — I2510 Atherosclerotic heart disease of native coronary artery without angina pectoris: Principal | ICD-10-CM | POA: Diagnosis present

## 2023-04-08 DIAGNOSIS — D509 Iron deficiency anemia, unspecified: Secondary | ICD-10-CM | POA: Diagnosis present

## 2023-04-08 DIAGNOSIS — I5022 Chronic systolic (congestive) heart failure: Secondary | ICD-10-CM | POA: Diagnosis present

## 2023-04-08 DIAGNOSIS — Z823 Family history of stroke: Secondary | ICD-10-CM

## 2023-04-08 DIAGNOSIS — Z8701 Personal history of pneumonia (recurrent): Secondary | ICD-10-CM

## 2023-04-08 DIAGNOSIS — E876 Hypokalemia: Secondary | ICD-10-CM | POA: Diagnosis present

## 2023-04-08 DIAGNOSIS — F5101 Primary insomnia: Secondary | ICD-10-CM | POA: Diagnosis present

## 2023-04-08 DIAGNOSIS — K551 Chronic vascular disorders of intestine: Secondary | ICD-10-CM | POA: Diagnosis present

## 2023-04-08 DIAGNOSIS — Z96642 Presence of left artificial hip joint: Secondary | ICD-10-CM | POA: Diagnosis present

## 2023-04-08 DIAGNOSIS — Z7984 Long term (current) use of oral hypoglycemic drugs: Secondary | ICD-10-CM

## 2023-04-08 DIAGNOSIS — R351 Nocturia: Secondary | ICD-10-CM | POA: Diagnosis present

## 2023-04-08 DIAGNOSIS — Z860100 Personal history of colon polyps, unspecified: Secondary | ICD-10-CM

## 2023-04-08 DIAGNOSIS — F1011 Alcohol abuse, in remission: Secondary | ICD-10-CM | POA: Diagnosis present

## 2023-04-08 DIAGNOSIS — I252 Old myocardial infarction: Secondary | ICD-10-CM

## 2023-04-08 DIAGNOSIS — F1020 Alcohol dependence, uncomplicated: Secondary | ICD-10-CM | POA: Diagnosis present

## 2023-04-08 DIAGNOSIS — Z8619 Personal history of other infectious and parasitic diseases: Secondary | ICD-10-CM

## 2023-04-08 DIAGNOSIS — R54 Age-related physical debility: Secondary | ICD-10-CM | POA: Diagnosis present

## 2023-04-08 DIAGNOSIS — Z23 Encounter for immunization: Secondary | ICD-10-CM | POA: Diagnosis not present

## 2023-04-08 DIAGNOSIS — Z8546 Personal history of malignant neoplasm of prostate: Secondary | ICD-10-CM | POA: Diagnosis not present

## 2023-04-08 DIAGNOSIS — N179 Acute kidney failure, unspecified: Secondary | ICD-10-CM | POA: Diagnosis present

## 2023-04-08 DIAGNOSIS — I110 Hypertensive heart disease with heart failure: Secondary | ICD-10-CM | POA: Diagnosis present

## 2023-04-08 DIAGNOSIS — I5020 Unspecified systolic (congestive) heart failure: Secondary | ICD-10-CM | POA: Diagnosis not present

## 2023-04-08 DIAGNOSIS — R9431 Abnormal electrocardiogram [ECG] [EKG]: Secondary | ICD-10-CM | POA: Diagnosis present

## 2023-04-08 DIAGNOSIS — I10 Essential (primary) hypertension: Secondary | ICD-10-CM | POA: Diagnosis present

## 2023-04-08 DIAGNOSIS — I214 Non-ST elevation (NSTEMI) myocardial infarction: Principal | ICD-10-CM | POA: Diagnosis present

## 2023-04-08 DIAGNOSIS — R55 Syncope and collapse: Secondary | ICD-10-CM

## 2023-04-08 DIAGNOSIS — Z79899 Other long term (current) drug therapy: Secondary | ICD-10-CM

## 2023-04-08 DIAGNOSIS — M1990 Unspecified osteoarthritis, unspecified site: Secondary | ICD-10-CM | POA: Diagnosis present

## 2023-04-08 DIAGNOSIS — Z923 Personal history of irradiation: Secondary | ICD-10-CM

## 2023-04-08 DIAGNOSIS — I951 Orthostatic hypotension: Secondary | ICD-10-CM | POA: Diagnosis present

## 2023-04-08 DIAGNOSIS — Z7982 Long term (current) use of aspirin: Secondary | ICD-10-CM

## 2023-04-08 DIAGNOSIS — Z8249 Family history of ischemic heart disease and other diseases of the circulatory system: Secondary | ICD-10-CM

## 2023-04-08 DIAGNOSIS — E861 Hypovolemia: Secondary | ICD-10-CM | POA: Diagnosis present

## 2023-04-08 LAB — COMPREHENSIVE METABOLIC PANEL
ALT: 27 U/L (ref 0–44)
AST: 58 U/L — ABNORMAL HIGH (ref 15–41)
Albumin: 4.3 g/dL (ref 3.5–5.0)
Alkaline Phosphatase: 55 U/L (ref 38–126)
Anion gap: 15 (ref 5–15)
BUN: 18 mg/dL (ref 8–23)
CO2: 19 mmol/L — ABNORMAL LOW (ref 22–32)
Calcium: 8.8 mg/dL — ABNORMAL LOW (ref 8.9–10.3)
Chloride: 97 mmol/L — ABNORMAL LOW (ref 98–111)
Creatinine, Ser: 2.28 mg/dL — ABNORMAL HIGH (ref 0.61–1.24)
GFR, Estimated: 30 mL/min — ABNORMAL LOW (ref >60)
Glucose, Bld: 103 mg/dL — ABNORMAL HIGH (ref 70–99)
Potassium: 2.5 mmol/L — CL (ref 3.5–5.1)
Sodium: 131 mmol/L — ABNORMAL LOW (ref 135–145)
Total Bilirubin: 1 mg/dL (ref <1.2)
Total Protein: 9.2 g/dL — ABNORMAL HIGH (ref 6.5–8.1)

## 2023-04-08 LAB — CBC WITH DIFFERENTIAL/PLATELET
Abs Immature Granulocytes: 0.04 10*3/uL (ref 0.00–0.07)
Basophils Absolute: 0.1 10*3/uL (ref 0.0–0.1)
Basophils Relative: 1 %
Eosinophils Absolute: 0.1 10*3/uL (ref 0.0–0.5)
Eosinophils Relative: 1 %
HCT: 44.6 % (ref 39.0–52.0)
Hemoglobin: 14.8 g/dL (ref 13.0–17.0)
Immature Granulocytes: 1 %
Lymphocytes Relative: 30 %
Lymphs Abs: 2.2 10*3/uL (ref 0.7–4.0)
MCH: 31.6 pg (ref 26.0–34.0)
MCHC: 33.2 g/dL (ref 30.0–36.0)
MCV: 95.3 fL (ref 80.0–100.0)
Monocytes Absolute: 0.9 10*3/uL (ref 0.1–1.0)
Monocytes Relative: 12 %
Neutro Abs: 4.1 10*3/uL (ref 1.7–7.7)
Neutrophils Relative %: 55 %
Platelets: 246 10*3/uL (ref 150–400)
RBC: 4.68 MIL/uL (ref 4.22–5.81)
RDW: 14.9 % (ref 11.5–15.5)
WBC: 7.3 10*3/uL (ref 4.0–10.5)
nRBC: 0 % (ref 0.0–0.2)

## 2023-04-08 LAB — ECHOCARDIOGRAM COMPLETE
AR max vel: 2.31 cm2
AV Area VTI: 2.66 cm2
AV Area mean vel: 2.51 cm2
AV Mean grad: 2.5 mm[Hg]
AV Peak grad: 5.4 mm[Hg]
Ao pk vel: 1.16 m/s
Area-P 1/2: 2.05 cm2
Calc EF: 48.4 %
Height: 72 in
S' Lateral: 2.2 cm
Single Plane A2C EF: 45.3 %
Single Plane A4C EF: 55.6 %
Weight: 3121.71 [oz_av]

## 2023-04-08 LAB — RESP PANEL BY RT-PCR (RSV, FLU A&B, COVID)  RVPGX2
Influenza A by PCR: NEGATIVE
Influenza B by PCR: NEGATIVE
Resp Syncytial Virus by PCR: NEGATIVE
SARS Coronavirus 2 by RT PCR: NEGATIVE

## 2023-04-08 LAB — HEPARIN LEVEL (UNFRACTIONATED): Heparin Unfractionated: 0.9 [IU]/mL — ABNORMAL HIGH (ref 0.30–0.70)

## 2023-04-08 LAB — TROPONIN I (HIGH SENSITIVITY)
Troponin I (High Sensitivity): 315 ng/L (ref <18)
Troponin I (High Sensitivity): 85 ng/L — ABNORMAL HIGH (ref <18)

## 2023-04-08 LAB — LIPASE, BLOOD: Lipase: 43 U/L (ref 11–51)

## 2023-04-08 LAB — MAGNESIUM: Magnesium: 1.2 mg/dL — ABNORMAL LOW (ref 1.7–2.4)

## 2023-04-08 LAB — BRAIN NATRIURETIC PEPTIDE: B Natriuretic Peptide: 58 pg/mL (ref 0.0–100.0)

## 2023-04-08 MED ORDER — SODIUM CHLORIDE 0.9 % IV BOLUS
1000.0000 mL | Freq: Once | INTRAVENOUS | Status: AC
  Administered 2023-04-08: 1000 mL via INTRAVENOUS

## 2023-04-08 MED ORDER — FENTANYL CITRATE PF 50 MCG/ML IJ SOSY: 12.5000 ug | PREFILLED_SYRINGE | INTRAMUSCULAR | Status: DC | PRN

## 2023-04-08 MED ORDER — ROSUVASTATIN CALCIUM 5 MG PO TABS
10.0000 mg | ORAL_TABLET | Freq: Every day | ORAL | Status: DC
  Administered 2023-04-08 – 2023-04-11 (×4): 10 mg via ORAL
  Filled 2023-04-08: qty 1
  Filled 2023-04-08: qty 2
  Filled 2023-04-08: qty 1
  Filled 2023-04-08: qty 2

## 2023-04-08 MED ORDER — LORATADINE 10 MG PO TABS: 10.0000 mg | ORAL_TABLET | Freq: Every day | ORAL | Status: DC | PRN

## 2023-04-08 MED ORDER — THIAMINE MONONITRATE 100 MG PO TABS
100.0000 mg | ORAL_TABLET | Freq: Every day | ORAL | Status: DC
  Administered 2023-04-08 – 2023-04-11 (×4): 100 mg via ORAL
  Filled 2023-04-08 (×4): qty 1

## 2023-04-08 MED ORDER — CALCIUM CARBONATE ANTACID 500 MG PO CHEW
1.0000 | CHEWABLE_TABLET | Freq: Two times a day (BID) | ORAL | Status: DC
  Administered 2023-04-08 – 2023-04-11 (×6): 200 mg via ORAL
  Filled 2023-04-08 (×7): qty 1

## 2023-04-08 MED ORDER — ACETAMINOPHEN 325 MG PO TABS: 650.0000 mg | ORAL_TABLET | Freq: Four times a day (QID) | ORAL | Status: DC | PRN

## 2023-04-08 MED ORDER — IPRATROPIUM-ALBUTEROL 0.5-2.5 (3) MG/3ML IN SOLN: 3.0000 mL | Freq: Once | RESPIRATORY_TRACT | Status: DC

## 2023-04-08 MED ORDER — FOLIC ACID 1 MG PO TABS
1.0000 mg | ORAL_TABLET | Freq: Every day | ORAL | Status: DC
  Administered 2023-04-08 – 2023-04-11 (×4): 1 mg via ORAL
  Filled 2023-04-08 (×4): qty 1

## 2023-04-08 MED ORDER — LORAZEPAM 1 MG PO TABS: 0.0000 mg | ORAL_TABLET | Freq: Two times a day (BID) | ORAL | Status: DC

## 2023-04-08 MED ORDER — ASPIRIN 81 MG PO TBEC
81.0000 mg | DELAYED_RELEASE_TABLET | Freq: Every day | ORAL | Status: DC
  Administered 2023-04-09 – 2023-04-10 (×2): 81 mg via ORAL
  Filled 2023-04-08 (×2): qty 1

## 2023-04-08 MED ORDER — INFLUENZA VAC A&B SURF ANT ADJ 0.5 ML IM SUSY
0.5000 mL | PREFILLED_SYRINGE | INTRAMUSCULAR | Status: AC
  Administered 2023-04-11: 0.5 mL via INTRAMUSCULAR
  Filled 2023-04-08: qty 0.5

## 2023-04-08 MED ORDER — ADULT MULTIVITAMIN W/MINERALS CH
1.0000 | ORAL_TABLET | Freq: Every day | ORAL | Status: DC
  Administered 2023-04-08 – 2023-04-11 (×4): 1 via ORAL
  Filled 2023-04-08 (×4): qty 1

## 2023-04-08 MED ORDER — OXYCODONE HCL 5 MG PO TABS
5.0000 mg | ORAL_TABLET | ORAL | Status: DC | PRN
  Administered 2023-04-09: 5 mg via ORAL
  Filled 2023-04-08: qty 1

## 2023-04-08 MED ORDER — ASPIRIN 325 MG PO TBEC
325.0000 mg | DELAYED_RELEASE_TABLET | Freq: Once | ORAL | Status: AC
  Administered 2023-04-08: 325 mg via ORAL
  Filled 2023-04-08: qty 1

## 2023-04-08 MED ORDER — THIAMINE HCL 100 MG/ML IJ SOLN
100.0000 mg | Freq: Every day | INTRAMUSCULAR | Status: DC
  Filled 2023-04-08 (×2): qty 2

## 2023-04-08 MED ORDER — ACETAMINOPHEN 650 MG RE SUPP: 650.0000 mg | Freq: Four times a day (QID) | RECTAL | Status: DC | PRN

## 2023-04-08 MED ORDER — ONDANSETRON HCL 4 MG/2ML IJ SOLN
4.0000 mg | Freq: Once | INTRAMUSCULAR | Status: AC
  Administered 2023-04-08: 4 mg via INTRAVENOUS
  Filled 2023-04-08: qty 2

## 2023-04-08 MED ORDER — LORAZEPAM 2 MG/ML IJ SOLN: 1.0000 mg | INTRAMUSCULAR | Status: AC | PRN

## 2023-04-08 MED ORDER — LORAZEPAM 1 MG PO TABS: 1.0000 mg | ORAL_TABLET | ORAL | Status: AC | PRN

## 2023-04-08 MED ORDER — BISACODYL 5 MG PO TBEC: 5.0000 mg | DELAYED_RELEASE_TABLET | Freq: Every day | ORAL | Status: DC | PRN

## 2023-04-08 MED ORDER — TRAZODONE HCL 50 MG PO TABS: 25.0000 mg | ORAL_TABLET | Freq: Every evening | ORAL | Status: DC | PRN

## 2023-04-08 MED ORDER — PANTOPRAZOLE SODIUM 40 MG PO TBEC
40.0000 mg | DELAYED_RELEASE_TABLET | Freq: Every evening | ORAL | Status: DC
  Administered 2023-04-09 – 2023-04-11 (×3): 40 mg via ORAL
  Filled 2023-04-08 (×3): qty 1

## 2023-04-08 MED ORDER — HEPARIN BOLUS VIA INFUSION
4000.0000 [IU] | Freq: Once | INTRAVENOUS | Status: AC
  Administered 2023-04-08: 4000 [IU] via INTRAVENOUS

## 2023-04-08 MED ORDER — CARVEDILOL 3.125 MG PO TABS
3.1250 mg | ORAL_TABLET | Freq: Two times a day (BID) | ORAL | Status: DC
  Administered 2023-04-08 – 2023-04-11 (×7): 3.125 mg via ORAL
  Filled 2023-04-08 (×7): qty 1

## 2023-04-08 MED ORDER — FLUTICASONE PROPIONATE 50 MCG/ACT NA SUSP: 2.0000 | Freq: Every day | NASAL | Status: DC | PRN

## 2023-04-08 MED ORDER — MAGNESIUM SULFATE 2 GM/50ML IV SOLN
2.0000 g | Freq: Once | INTRAVENOUS | Status: AC
  Administered 2023-04-08: 2 g via INTRAVENOUS
  Filled 2023-04-08: qty 50

## 2023-04-08 MED ORDER — LORAZEPAM 1 MG PO TABS: 0.0000 mg | ORAL_TABLET | Freq: Four times a day (QID) | ORAL | Status: AC

## 2023-04-08 MED ORDER — TAMSULOSIN HCL 0.4 MG PO CAPS
0.4000 mg | ORAL_CAPSULE | Freq: Every day | ORAL | Status: DC
  Administered 2023-04-08 – 2023-04-11 (×4): 0.4 mg via ORAL
  Filled 2023-04-08 (×4): qty 1

## 2023-04-08 MED ORDER — ALLOPURINOL 100 MG PO TABS
100.0000 mg | ORAL_TABLET | Freq: Every day | ORAL | Status: DC
  Administered 2023-04-09 – 2023-04-11 (×3): 100 mg via ORAL
  Filled 2023-04-08 (×3): qty 1

## 2023-04-08 MED ORDER — POTASSIUM CHLORIDE 10 MEQ/100ML IV SOLN
10.0000 meq | INTRAVENOUS | Status: DC
  Filled 2023-04-08: qty 100

## 2023-04-08 MED ORDER — HEPARIN (PORCINE) 25000 UT/250ML-% IV SOLN
950.0000 [IU]/h | INTRAVENOUS | Status: DC
  Administered 2023-04-08: 1200 [IU]/h via INTRAVENOUS
  Filled 2023-04-08 (×2): qty 250

## 2023-04-08 MED ORDER — MAGNESIUM SULFATE 2 GM/50ML IV SOLN
2.0000 g | Freq: Once | INTRAVENOUS | Status: AC
Start: 2023-04-08 — End: 2023-04-08
  Administered 2023-04-08: 2 g via INTRAVENOUS
  Filled 2023-04-08: qty 50

## 2023-04-08 MED ORDER — POTASSIUM CHLORIDE CRYS ER 20 MEQ PO TBCR
40.0000 meq | EXTENDED_RELEASE_TABLET | Freq: Four times a day (QID) | ORAL | Status: AC
  Administered 2023-04-08 (×2): 40 meq via ORAL
  Filled 2023-04-08 (×2): qty 2

## 2023-04-08 MED ORDER — PANTOPRAZOLE SODIUM 40 MG IV SOLR
80.0000 mg | Freq: Once | INTRAVENOUS | Status: AC
  Administered 2023-04-08: 80 mg via INTRAVENOUS
  Filled 2023-04-08: qty 20

## 2023-04-08 MED ORDER — PROCHLORPERAZINE EDISYLATE 10 MG/2ML IJ SOLN: 10.0000 mg | INTRAMUSCULAR | Status: DC | PRN

## 2023-04-08 NOTE — Progress Notes (Signed)
PHARMACY - ANTICOAGULATION CONSULT NOTE  Pharmacy Consult for heparin Indication: chest pain/ACS  Allergies  Allergen Reactions   Nsaids Other (See Comments)     Anemia, Gastrointestinal hemorrhage    Patient Measurements: Height: 6' (182.9 cm) Weight: 88.5 kg (195 lb 1.7 oz) IBW/kg (Calculated) : 77.6 Heparin Dosing Weight: 88.5 kg  Vital Signs: Temp: 97.4 F (36.3 C) (11/26 0748) Temp Source: Axillary (11/26 0748) BP: 80/56 (11/26 0845) Pulse Rate: 98 (11/26 0845)  Labs: Recent Labs    04/08/23 0752  HGB 14.8  HCT 44.6  PLT 246  CREATININE 2.28*  TROPONINIHS 315*    Estimated Creatinine Clearance: 31.7 mL/min (A) (by C-G formula based on SCr of 2.28 mg/dL (H)).   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 EKG shows diffuse T wave inversion and troponin elevated to 315.   Goal of Therapy:  Heparin level 0.3-0.7 units/ml Monitor platelets by anticoagulation protocol: Yes   Plan:  Give 4000 units bolus x 1 Start heparin infusion at 1,200 units/hr Check anti-Xa level in 8 hours and daily while on heparin Continue to monitor H&H and platelets  Adrian Prows PharmD Student 04/08/2023,9:22 AM

## 2023-04-08 NOTE — Consult Note (Addendum)
Cardiology Consultation   Patient ID: JENTRY SLAYTON MRN: [id number]; DOB: 1950-12-14  Admit date: 04/08/2023 Date of Consult: 04/08/2023  PCP:  [name], MD   [facility] Providers Cardiologist:  Nona Dell, MD        Patient Profile:   Jonathan Benitez is a 72 y.o. 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 Wallace Cullens.  History of Present Illness:   [name] was last examined by the cardiology service during an admission in 01/2022 for an AKI. Cardiology was consulted as echocardiogram showed his EF was mildly reduced at 45 to 50% and felt to be related to alcohol use at that time. He was started on Coreg 6.25 mg twice daily, Farxiga 10 mg daily, Crestor 10 mg daily and Entresto 24-26 mg twice daily with plans for outpatient follow-up but he has not been evaluated cardiology since.  He was admitted again in 10/2022 for evaluation of decreased appetite and dehydration and was found to have failure to thrive and an AKI. Creatinine peaked at 4.23 and had improved to 1.36 in the time of hospital discharge. Was also treated for pneumonia during admission with Azithromycin and Rocephin. It appears he was continued on Coreg, Jardiance and Entresto at discharge.  He presented to [facility] ED this morning for evaluation of shortness of breath for the past week. In talking with the patient today, he reports a variety of symptoms over the past week.  He initially reports having shortness of breath at rest and with activity for the past week.  Unaware of any sick contacts. Also reports having chest pain and says has been constant for several days and is not worse with exertion or positional changes. Says that the reason he came to the ED was that he had a  syncopal episode this morning while driving. He started to feel dizzy and pulled to the side of the road and then lost consciousness for a few seconds.  Reports having a syncopal episode several days ago as well which occurred while sitting on the bed and he again thinks he lost conscious for a few seconds and felt dizzy prior to this.  Says that he has a decreased appetite food intake is minimal.  Says he consumes approximately 2 bottles of water a day and that is it as far as fluids. Reports occasional alcohol use with 1-2 beers a few days a week but no binge drinking.  Initial labs show WBC 7.3, Hgb 14.8, platelets 246, Na+ 131, K+ 2.5 and creatinine 2.28 (previously 1.03 in 02/2023). Mg 1.2. AST 58 and ALT 27. Albumin 4.3. BNP normal at 58.  Initial Hs troponin elevated at 315 with repeat at 85.  Negative for COVID, influenza and RSV.  CXR with no active disease. EKG showed sinus tachycardia, HR 109 with diffuse TWI along the inferior and lateral leads and prolonged QT (TWI new when compared to prior tracings).   He received ASA 325 mg daily and has been started on IV Heparin and potassium and magnesium supplementation.  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    Past Surgical History:  Procedure Laterality Date   COLONOSCOPY N/A 03/30/2018   Procedure: COLONOSCOPY;  Surgeon: Malissa Hippo, MD;  Location: [facility];  Service: Endoscopy;  Laterality: N/A;  12:45   COLONOSCOPY WITH PROPOFOL N/A 01/23/2022   Procedure: COLONOSCOPY WITH PROPOFOL;  Surgeon: Lanelle Bal, DO;  Location: [facility];  Service: Endoscopy;  Laterality: N/A;   [place] REPAIR     [place] [facility]   POLYPECTOMY  03/30/2018   Procedure: POLYPECTOMY;  Surgeon: Malissa Hippo, MD;  Location: [facility];  Service: Endoscopy;;  colon   PROSTATE BIOPSY      positive for cancer   TOTAL HIP ARTHROPLASTY Left    [facility] 11/07/17 Dr. Nancy Fetter     Home Medications:  Prior to Admission medications   Medication Sig Start Date End Date Taking? Authorizing Provider  allopurinol (ZYLOPRIM) 100 MG tablet TAKE ONE TABLET BY MOUTH EVERY DAY TO PREVENT GOUT 04/10/22   [provider]  carvedilol (COREG) 6.25 MG tablet Take 1 tablet (6.25 mg total) by mouth 2 (two) times daily with a meal. 11/12/22   Allena Katz, Earlie Lou, MD  cetirizine (ZYRTEC) 10 MG tablet Take 10 mg by mouth daily.    [provider]  colchicine 0.6 MG tablet Take 2 tablets once, then 1 tablet 1 hour later. Take 1 tablet once daily by mouth from Day 2. 01/31/23   [name], MD  empagliflozin (JARDIANCE) 25 MG TABS tablet TAKE ONE-HALF TABLET BY MOUTH EVERY DAY FOR CHRONIC HEART FAILURE 02/12/22   [provider]  feeding supplement (ENSURE ENLIVE / ENSURE PLUS) LIQD Take 237 mLs by mouth 2 (two) times daily between meals. 01/24/22   [name], MD  fluticasone [facility]) 50 MCG/ACT nasal spray Place 2 sprays into both nostrils daily as needed for allergies.     [provider]  hydrocortisone (ANUSOL-HC) 25 MG suppository Place 1 suppository (25 mg total) rectally 2 (two) times daily as needed for hemorrhoids or anal itching. 01/23/22   [name], MD  magnesium oxide (MAG-OX) 400 (240 Mg) MG tablet Take 1 tablet (400 mg total) by mouth 2 (two) times daily. 01/23/22   [name], MD  omeprazole (PRILOSEC) 40 MG capsule Take 1 capsule (40 mg total) by mouth daily. 01/23/22   [name], MD  rosuvastatin (CRESTOR) 10 MG tablet Take 1 tablet (10 mg total) by mouth daily. 01/30/22   [name], MD  sacubitril-valsartan (ENTRESTO) 24-26 MG Take 1 tablet by mouth 2 (two) times daily. 11/09/22   [name], [name], DO  tamsulosin (FLOMAX) 0.4 MG CAPS capsule Take 1 capsule (0.4 mg total) by mouth daily. 01/31/23   [name], MD  thiamine (VITAMIN  B-1) 100 MG tablet Take 1 tablet (100 mg total) by mouth daily. 01/24/22   [name], MD  traZODone (DESYREL) 50 MG tablet Take 50 mg by mouth at bedtime as needed.    [provider]    Inpatient Medications: Scheduled Meds:  [START ON 04/09/2023] aspirin EC  81 mg Oral Daily   potassium chloride  40 mEq Oral Q6H   rosuvastatin  10 mg Oral Daily   Continuous Infusions:  heparin 1,200 Units/hr (04/08/23 0958)   PRN Meds:   Allergies:    Allergies  Allergen Reactions   Nsaids Other (See Comments)     Anemia, Gastrointestinal hemorrhage    Social History:   Social History  Socioeconomic History   Marital status: Widowed    Spouse name: Not on file   Number of children: Not on file   Years of education: Not on file   Highest education level: Not on file  Occupational History   Not on file  Tobacco Use   Smoking status: Never   Smokeless tobacco: Never  Vaping Use   Vaping status: Never Used  Substance and Sexual Activity   Alcohol use: Yes    Comment: was drinking 3-4 beers a day   Drug use: Yes    Types: Marijuana    Comment: Smokes a joint every day   Sexual activity: Not on file  Other Topics Concern   Not on file  Social History Narrative   Not on file   Social Determinants of Health   Financial Resource Strain: Medium Risk (03/26/2021)   Overall Financial Resource Strain (CARDIA)    Difficulty of Paying Living Expenses: Somewhat hard  Food Insecurity: No Food Insecurity (11/07/2022)   Hunger Vital Sign    Worried About Running Out of Food in the Last Year: Never true    Ran Out of Food in the Last Year: Never true  Transportation Needs: No Transportation Needs (11/07/2022)   PRAPARE - Administrator, Civil Service (Medical): No    Lack of Transportation (Non-Medical): No  Physical Activity: Insufficiently Active (03/26/2021)   Exercise Vital Sign    Days of Exercise per Week: 7 days    Minutes of Exercise per Session: 20 min   Stress: No Stress Concern Present (03/26/2021)   [employer] of Occupational Health - Occupational Stress Questionnaire    Feeling of Stress : Not at all  Social Connections: Socially Isolated (03/26/2021)   Social Connection and Isolation Panel [NHANES]    Frequency of Communication with Friends and Family: More than three times a week    Frequency of Social Gatherings with Friends and Family: More than three times a week    Attends Religious Services: Never    Database administrator or Organizations: No    Attends Banker Meetings: Never    Marital Status: Divorced  Catering manager Violence: Not At Risk (11/07/2022)   Humiliation, Afraid, Rape, and Kick questionnaire    Fear of Current or Ex-Partner: No    Emotionally Abused: No    Physically Abused: No    Sexually Abused: No    Family History:    Family History  Problem Relation Age of Onset   Hypertension Mother    Coronary artery disease Mother    Hypertension Father    Stroke Father    Colon cancer Neg Hx    Colon polyps Neg Hx      ROS:  Please see the history of present illness.   All other ROS reviewed and negative.     Physical Exam/Data:   Vitals:   04/08/23 0845 04/08/23 0900 04/08/23 0930 04/08/23 0945  BP: (!) 80/56 112/81 109/76 114/77  Pulse: 98 (!) 101 76 79  Resp: 15 (!) 29 17 16   Temp:      TempSrc:      SpO2: 98% 98% 96% 96%  Weight:      Height:        Intake/Output Summary (Last 24 hours) at 04/08/2023 1200 Last data filed at 04/08/2023 1002 Gross per 24 hour  Intake 1040.05 ml  Output --  Net 1040.05 ml      04/08/2023    7:47 AM  01/31/2023    9:42 AM 11/12/2022    9:03 AM  Last 3 Weights  Weight (lbs) 195 lb 1.7 oz 195 lb 3.2 oz 192 lb 6.4 oz  Weight (kg) 88.5 kg 88.542 kg 87.272 kg     Body mass index is 26.46 kg/m.  General:  Well nourished, well developed, in no acute distress. HEENT: normal Neck: no JVD Vascular: No carotid bruits; Distal pulses 2+  bilaterally Cardiac:  normal S1, S2; RRR; no murmur Lungs:  clear to auscultation bilaterally, no wheezing, rhonchi or rales  Abd: soft, nontender, no hepatomegaly  Ext: no pitting edema Musculoskeletal:  No deformities, BUE and BLE strength normal and equal Skin: warm and dry  Neuro:  CNs 2-12 intact, no focal abnormalities noted Psych:  Normal affect   EKG:  The EKG was personally reviewed and demonstrates: Sinus tachycardia, HR 109 with diffuse TWI along the inferior and lateral leads and prolonged QT (TWI new when compared to prior tracings).  Telemetry:  Telemetry was personally reviewed and demonstrates: NSR, HR in 60's to 70's.   Relevant CV Studies:  Echocardiogram: 01/2022 IMPRESSIONS     1. Left ventricular ejection fraction, by estimation, is 45 to 50%. The  left ventricle has mildly decreased function. The left ventricle  demonstrates regional wall motion abnormalities (see scoring  diagram/findings for description). There is mild  asymmetric left ventricular hypertrophy of the basal-septal segment. There  is hypokinesis of the mid-to-apical lateral LV segments, all apical  segments, and apex. Indeterminate diastolic filling due to E-A fusion.   2. Right ventricular systolic function is normal. The right ventricular  size is normal.   3. Left atrial size was mild to moderately dilated.   4. The mitral valve is abnormal. Trivial mitral valve regurgitation.  Moderate to severe mitral annular calcification.   5. The aortic valve is tricuspid. There is mild calcification of the  aortic valve. There is mild thickening of the aortic valve. Aortic valve  regurgitation is not visualized. Aortic valve sclerosis/calcification is  present, without any evidence of  aortic stenosis.   6. Aortic dilatation noted. There is borderline dilatation of the aortic  root, measuring 37 mm.   7. The inferior vena cava is normal in size with greater than 50%  respiratory variability,  suggesting right atrial pressure of 3 mmHg.   Laboratory Data:  High Sensitivity Troponin:   Recent Labs  Lab 04/08/23 0752 04/08/23 0951  TROPONINIHS 315* 85*     Chemistry Recent Labs  Lab 04/08/23 0752  NA 131*  K 2.5*  CL 97*  CO2 19*  GLUCOSE 103*  BUN 18  CREATININE 2.28*  CALCIUM 8.8*  MG 1.2*  GFRNONAA 30*  ANIONGAP 15    Recent Labs  Lab 04/08/23 0752  PROT 9.2*  ALBUMIN 4.3  AST 58*  ALT 27  ALKPHOS 55  BILITOT 1.0   Lipids No results for input(s): "CHOL", "TRIG", "HDL", "LABVLDL", "LDLCALC", "CHOLHDL" in the last 168 hours.  Hematology Recent Labs  Lab 04/08/23 0752  WBC 7.3  RBC 4.68  HGB 14.8  HCT 44.6  MCV 95.3  MCH 31.6  MCHC 33.2  RDW 14.9  PLT 246   Thyroid No results for input(s): "TSH", "FREET4" in the last 168 hours.  BNP Recent Labs  Lab 04/08/23 0752  BNP 58.0    DDimer No results for input(s): "DDIMER" in the last 168 hours.   Radiology/Studies:  DG Chest Port 1 View  Result Date: 04/08/2023 CLINICAL DATA:  Shortness of breath and chest pain for 1 week. Anemia. EXAM: PORTABLE CHEST 1 VIEW COMPARISON:  11/07/2022 FINDINGS: The heart size and mediastinal contours are within normal limits. Mild bibasilar scarring again noted. Both lungs are otherwise clear. The visualized skeletal structures are unremarkable. IMPRESSION: No active disease. Electronically Signed   By: Danae Orleans M.D.   On: 04/08/2023 08:17     Assessment and Plan:   1. Chest Pain with Mixed Features/Elevated Troponin Values/Abnormal EKG - His chest pain has atypical qualities as it has been constant for the past few days but he also reports worsening dyspnea on exertion. Found to have elevated Hs Troponin at 315 and 85 and his EKG does show diffuse T wave inversion along the inferior and lateral leads. - An echocardiogram is pending to assess for any structural abnormalities. Pending results of this and improvement in his AKI, would anticipate a cardiac  catheterization for definitive evaluation later this admission. He initially reports he would not be interested in transfer to [facility] but upon reviewing indications for this, he is open to transfer if indicated later this admission. - Continue IV Heparin and would start ASA 81 mg daily while continuing PTA Crestor 10 mg daily.  2. Chronic HFmrEF - His ejection fraction was at 45 to 50% by echocardiogram in 01/2022 and felt to be related to alcohol use at that time. He reports occasional beer consumption but no binge drinking. - A repeat echocardiogram is pending to assess for any structural abnormalities. His BNP is normal at 58 and he appears euvolemic by examination today. He was on Jardiance 12.5 mg daily and Entresto 24-26 mg twice daily prior to admission and would hold for now given his AKI. Restart Coreg pending BP trend.   3. Syncope/Prolonged QTc - Unclear etiology as he reports 2 episodes of syncope within the past week and 1 episode occurred while sitting on the side of his bed and another while driving. Can check orthostatics but he already received 2 L of IV fluids. - He would be at risk for a cardiac arrhythmia given his prolonged QT. He has received magnesium and potassium supplementation as K+ was at 2.5 on admission and Mg at 1.2. Keep K+ ~ 4.0 and Mg ~ 2.0.  4. AKI - Creatinine elevated at 2.28 on admission and was previously 1.03 last month. Repeat BMET in AM.   Risk Assessment/Risk Scores:     TIMI Risk Score for Unstable Angina or Non-ST Elevation MI:   The patient's TIMI risk score is 3, which indicates a 13% risk of all cause mortality, new or recurrent myocardial infarction or need for urgent revascularization in the next 14 days.   For questions or updates, please contact [facility] Please consult [email or web address] for contact info under    Signed, Ellsworth Lennox, PA-C  04/08/2023 12:00 PM  Attending note  Patient seen and discussed with PA  [place] Ouch, I agree with her documentation. 72 yo male history of HTN, EtoH/marijuana use, prostate cancer s/p radiation, Hep C, HFmrEF 01/2022 echo LVEF 45-50%, mesenteric vascular disease, two prior admits with hypovolemia/AKI and failure to thrive 01/2022 and 10/2022 presents with SOB and chest pain, episode of syncope. Chest pain with some atypical features given long constant duration.    ER vitals p 106 bp 95/62 95% RA  Na 131 K 2.5 BUN 18 Cr 2.28 AST 58 WBC 7.3 Hgb 14.8 Plt 246 BNP 58 Mg 1.2 Trop 315-->85 EKG SR, diffuse deep TWIs CXR:  no acute process  01/2022 echo: LVEF 45-50%    1.NSTEMI - peak trop to 315, trending down. 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 Unclear if acute obstructive disease or if perhaps his hypotension led to demand ischemia in setting of chronic obstructive disease.  - echo pending - medical therapy with hep gtt, ASA 81, coreg 3.125mg  bid, crestor 10mg  daily. 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  - with AKI and multiple electrolyte abnormalities would hold on cath. Reassess tomrrow, will make NPO at midnight.     2.Hypovolemia/Syncope - prior admissions with hypovolemia/AKI/failure to thrive - presents again with AKI, multiple electrolyte abnormalities. Initially hypotensive, syncope prior to admission - has received IVFs in the ER.   3. AKI - at least 3rd admission presenting hypovolemic with AKI, electrolyte abnormalities - per chart EtOH history, failure to thrive poor oral intake chronically - follow with IVFs  Dina Rich MD

## 2023-04-08 NOTE — Progress Notes (Signed)
PHARMACY - ANTICOAGULATION CONSULT NOTE  Pharmacy Consult for heparin Indication: chest pain/ACS  Allergies  Allergen Reactions   Nsaids Other (See Comments)     Anemia, Gastrointestinal hemorrhage    Patient Measurements: Height: 6' (182.9 cm) Weight: 88.5 kg (195 lb 1.7 oz) IBW/kg (Calculated) : 77.6 Heparin Dosing Weight: 88.5 kg  Vital Signs: Temp: 97.8 F (36.6 C) (11/26 1550) Temp Source: Oral (11/26 1550) BP: 137/101 (11/26 1550) Pulse Rate: 71 (11/26 1550)  Labs: Recent Labs    04/08/23 0752 04/08/23 0951 04/08/23 1841  HGB 14.8  --   --   HCT 44.6  --   --   PLT 246  --   --   HEPARINUNFRC  --   --  0.90*  CREATININE 2.28*  --   --   TROPONINIHS 315* 85*  --     Estimated Creatinine Clearance: 31.7 mL/min (A) (by C-G formula based on SCr of 2.28 mg/dL (H)).   Medical History: 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    Medications:  Medications Prior to Admission  Medication Sig Dispense Refill Last Dose   allopurinol (ZYLOPRIM) 100 MG tablet Take 100 mg by mouth daily.   04/07/2023   carvedilol (COREG) 6.25 MG tablet Take 1 tablet (6.25 mg total) by mouth 2 (two) times daily with a meal. 60 tablet 1 04/07/2023 at unk   cetirizine (ZYRTEC) 10 MG tablet Take 10 mg by mouth daily.   04/07/2023   cyclobenzaprine (FLEXERIL) 10 MG tablet Take 10 mg by mouth 2 (two) times daily as needed for muscle spasms.   04/08/2023   empagliflozin (JARDIANCE) 25 MG TABS tablet Take 12.5 mg by mouth daily.   04/07/2023   fluticasone (FLONASE) 50 MCG/ACT nasal spray Place 2 sprays into both nostrils daily as needed for allergies.    Past Month   hydrocortisone (ANUSOL-HC) 25 MG suppository Place 1 suppository (25 mg total) rectally 2 (two) times daily as needed for hemorrhoids or anal itching. 12 suppository 0 Past Month    magnesium oxide (MAG-OX) 400 (240 Mg) MG tablet Take 1 tablet (400 mg total) by mouth 2 (two) times daily. 30 tablet 1 04/07/2023   omeprazole (PRILOSEC) 40 MG capsule Take 1 capsule (40 mg total) by mouth daily. 30 capsule 3 04/07/2023   rosuvastatin (CRESTOR) 10 MG tablet Take 1 tablet (10 mg total) by mouth daily. 30 tablet 5 04/07/2023   sacubitril-valsartan (ENTRESTO) 24-26 MG Take 1 tablet by mouth 2 (two) times daily. 60 tablet 2 04/07/2023   sildenafil (VIAGRA) 100 MG tablet Take 50 mg by mouth as needed for erectile dysfunction.   Past Month   tamsulosin (FLOMAX) 0.4 MG CAPS capsule Take 1 capsule (0.4 mg total) by mouth daily. 30 capsule 5 04/07/2023   thiamine (VITAMIN B-1) 100 MG tablet Take 1 tablet (100 mg total) by mouth daily. 30 tablet 2 04/07/2023   traZODone (DESYREL) 50 MG tablet Take 50 mg by mouth at bedtime as needed for sleep.   Past Week   triamcinolone ointment (KENALOG) 0.1 % Apply 1 Application topically 4 (four) times daily as needed (itching).   Past Week    Assessment: 72 yo male with h/o AKI, iron deficiency anemia here with dyspnea, lightheadedness and chest tightness. EKG shows diffuse T wave inversion and troponin  elevated to 315.   Date/Time HL Rate  Comment 11/26 1841 0.9 1200 un/hr SUPRAtherapeutic  Goal of Therapy:  Heparin level 0.3-0.7 units/ml Monitor platelets by anticoagulation protocol: Yes   Plan:  Heparin level SUPRAtherapeutic Decrease heparin infusion rate to 1050 units/hr Check heparin level in 8 hours and daily while on heparin Continue to monitor CBC and signs/symptoms of bleeding daily   Merryl Hacker PharmD  Clinical Pharmacist 04/08/2023,7:25 PM

## 2023-04-08 NOTE — H&P (Addendum)
History and Physical  [facility]  Jonathan Benitez ZOX:[id number] DOB: [birth year] DOA: 04/08/2023  PCP: [name], MD  Patient coming from: Home  Level of care: Telemetry  I have personally briefly reviewed patient's old medical records in [facility]  Chief Complaint: SOB and CP  HPI: Jonathan Benitez is a 72 year old male with hypertension, history of alcohol use (3-4 beers/day), history of hepatitis C and history of prostate cancer s/p radiotherapy, HFmrEF, GERD, hyperlipidemia, s/p left hip replacement, elevated LFTs, h/o recreational substance use (marijuana), recurrent bouts of hyponatremia and hypokalemia presented to ED today complaining of intermittent chest pain and progressive shortness of breath for at least 1 week.  He also reported he passed out a couple of times in the last week.  He was found to have an abnormal EKG with prolonged QT, ST wave depression and elevated troponin of 315.  His BNP was 58.  He hg was 14.8.  He was started on IV heparin and was seen by cardiology.  His electrolytes were abnormal with magnesium of 1.2 and potassium of 2.5 with a sodium of 131.  His chest xray was unrevealing.  He viral testing was negative for Covid, influenza and other respiratory viruses.  His renal function was noted to be abnormal with a creatinine of 2.28 which is up from 1.03 on 02/25/23.  Cardiology requested admission for IV heparin and are considering sending for cardiac catheterization.     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    Past Surgical History:  Procedure Laterality Date   COLONOSCOPY N/A 03/30/2018   Procedure: COLONOSCOPY;  Surgeon: Malissa Hippo, MD;  [facility];  Service: Endoscopy;  Laterality: N/A;  12:45   COLONOSCOPY WITH PROPOFOL N/A 01/23/2022   Procedure: COLONOSCOPY  WITH PROPOFOL;  Surgeon: Lanelle Bal, DO;  Location: [facility];  Service: Endoscopy;  Laterality: N/A;   HERNIA REPAIR     [facility]   POLYPECTOMY  03/30/2018   Procedure: POLYPECTOMY;  Surgeon: Malissa Hippo, MD;  Location: [facility];  Service: Endoscopy;;  colon   PROSTATE BIOPSY     positive for cancer   TOTAL HIP ARTHROPLASTY Left    [facility] 11/07/17 Dr. Nancy Fetter     reports that he has never smoked. He has never used smokeless tobacco. He reports current alcohol use. He reports current drug use. Drug: Marijuana.  Allergies  Allergen Reactions   Nsaids Other (See Comments)     Anemia, Gastrointestinal hemorrhage    Family History  Problem Relation Age of Onset   Hypertension Mother    Coronary artery disease Mother    Hypertension Father    Stroke Father    Colon cancer Neg Hx    Colon polyps Neg Hx     Prior to Admission medications   Medication Sig Start Date End Date Taking? Authorizing Provider  allopurinol (ZYLOPRIM) 100 MG tablet Take 100 mg by mouth daily. 04/10/22  Yes [provider]  carvedilol (COREG) 6.25 MG tablet Take 1 tablet (6.25 mg total) by mouth 2 (two) times daily with a meal. 11/12/22  Yes [name], MD  cetirizine (ZYRTEC) 10 MG tablet Take 10 mg by mouth daily.   Yes [provider]  cyclobenzaprine (FLEXERIL)  10 MG tablet Take 10 mg by mouth 2 (two) times daily as needed for muscle spasms. 02/19/23  Yes [provider]  empagliflozin (JARDIANCE) 25 MG TABS tablet Take 12.5 mg by mouth daily. 02/12/22  Yes [provider]  fluticasone (FLONASE) 50 MCG/ACT nasal spray Place 2 sprays into both nostrils daily as needed for allergies.    Yes [provider]  hydrocortisone (ANUSOL-HC) 25 MG suppository Place 1 suppository (25 mg total) rectally 2 (two) times daily as needed for hemorrhoids or anal itching. 01/23/22  Yes [name], MD  magnesium oxide (MAG-OX) 400 (240 Mg) MG tablet  Take 1 tablet (400 mg total) by mouth 2 (two) times daily. 01/23/22  Yes [name], MD  omeprazole (PRILOSEC) 40 MG capsule Take 1 capsule (40 mg total) by mouth daily. 01/23/22  Yes [name], MD  rosuvastatin (CRESTOR) 10 MG tablet Take 1 tablet (10 mg total) by mouth daily. 01/30/22  Yes Patel, Earlie Lou, MD  sacubitril-valsartan (ENTRESTO) 24-26 MG Take 1 tablet by mouth 2 (two) times daily. 11/09/22  Yes Shah, [name], DO  sildenafil (VIAGRA) 100 MG tablet Take 50 mg by mouth as needed for erectile dysfunction. 06/07/22  Yes [provider]  tamsulosin (FLOMAX) 0.4 MG CAPS capsule Take 1 capsule (0.4 mg total) by mouth daily. 01/31/23  Yes [name], MD  thiamine (VITAMIN B-1) 100 MG tablet Take 1 tablet (100 mg total) by mouth daily. 01/24/22  Yes [name], MD  traZODone (DESYREL) 50 MG tablet Take 50 mg by mouth at bedtime as needed for sleep.   Yes [provider]  triamcinolone ointment (KENALOG) 0.1 % Apply 1 Application topically 4 (four) times daily as needed (itching). 06/21/22  Yes [provider]    Physical Exam: Vitals:   04/08/23 0930 04/08/23 0945 04/08/23 1001 04/08/23 1220  BP: 109/76 114/77 110/81   Pulse: 76 79    Resp: 17 16 18    Temp:    98 F (36.7 C)  TempSrc:    Oral  SpO2: 96% 96%    Weight:      Height:       Constitutional: NAD, calm, comfortable lying supine on gurnee  Eyes: PERRL, lids and conjunctivae normal ENMT: Mucous membranes are moist. Posterior pharynx clear of any exudate or lesions.Normal dentition.  Neck: normal, supple, no masses, no thyromegaly Respiratory: clear to auscultation bilaterally, no wheezing, no crackles. Normal respiratory effort. No accessory muscle use.  Cardiovascular: normal s1, s2 sounds, no murmurs / rubs / gallops. No extremity edema. 2+ pedal pulses. No carotid bruits.  Abdomen: no tenderness, no masses palpated. No hepatosplenomegaly. Bowel sounds positive.  Musculoskeletal: no  clubbing / cyanosis. No joint deformity upper and lower extremities. Good ROM, no contractures. Normal muscle tone.  Skin: no rashes, lesions, ulcers. No induration Neurologic: CN 2-12 grossly intact. Sensation intact, DTR normal. Strength 5/5 in all 4.  Psychiatric: Normal judgment and insight. Alert and oriented x 3. Normal mood.   Labs on Admission: I have personally reviewed following labs and imaging studies  CBC: Recent Labs  Lab 04/08/23 0752  WBC 7.3  NEUTROABS 4.1  HGB 14.8  HCT 44.6  MCV 95.3  PLT 246   Basic Metabolic Panel: Recent Labs  Lab 04/08/23 0752  NA 131*  K 2.5*  CL 97*  CO2 19*  GLUCOSE 103*  BUN 18  CREATININE 2.28*  CALCIUM 8.8*  MG 1.2*   GFR: Estimated Creatinine Clearance: 31.7 mL/min (A) (  by C-G formula based on SCr of 2.28 mg/dL (H)). Liver Function Tests: Recent Labs  Lab 04/08/23 0752  AST 58*  ALT 27  ALKPHOS 55  BILITOT 1.0  PROT 9.2*  ALBUMIN 4.3   Recent Labs  Lab 04/08/23 0752  LIPASE 43   No results for input(s): "AMMONIA" in the last 168 hours. Coagulation Profile: No results for input(s): "INR", "PROTIME" in the last 168 hours. Cardiac Enzymes: No results for input(s): "CKTOTAL", "CKMB", "CKMBINDEX", "TROPONINI" in the last 168 hours. BNP (last 3 results) No results for input(s): "PROBNP" in the last 8760 hours. HbA1C: No results for input(s): "HGBA1C" in the last 72 hours. CBG: No results for input(s): "GLUCAP" in the last 168 hours. Lipid Profile: No results for input(s): "CHOL", "HDL", "LDLCALC", "TRIG", "CHOLHDL", "LDLDIRECT" in the last 72 hours. Thyroid Function Tests: No results for input(s): "TSH", "T4TOTAL", "FREET4", "T3FREE", "THYROIDAB" in the last 72 hours. Anemia Panel: No results for input(s): "VITAMINB12", "FOLATE", "FERRITIN", "TIBC", "IRON", "RETICCTPCT" in the last 72 hours. Urine analysis:    Component Value Date/Time   COLORURINE YELLOW 11/07/2022 1117   APPEARANCEUR HAZY (A) 11/07/2022  1117   LABSPEC 1.004 (L) 11/07/2022 1117   PHURINE 6.0 11/07/2022 1117   GLUCOSEU NEGATIVE 11/07/2022 1117   HGBUR SMALL (A) 11/07/2022 1117   BILIRUBINUR NEGATIVE 11/07/2022 1117   KETONESUR NEGATIVE 11/07/2022 1117   PROTEINUR NEGATIVE 11/07/2022 1117   UROBILINOGEN 0.2 12/17/2013 0825   NITRITE NEGATIVE 11/07/2022 1117   LEUKOCYTESUR NEGATIVE 11/07/2022 1117    Radiological Exams on Admission: DG Chest Port 1 View  Result Date: 04/08/2023 CLINICAL DATA:  Shortness of breath and chest pain for 1 week. Anemia. EXAM: PORTABLE CHEST 1 VIEW COMPARISON:  11/07/2022 FINDINGS: The heart size and mediastinal contours are within normal limits. Mild bibasilar scarring again noted. Both lungs are otherwise clear. The visualized skeletal structures are unremarkable. IMPRESSION: No active disease. Electronically Signed   By: Danae Orleans M.D.   On: 04/08/2023 08:17    EKG: Independently reviewed. Prolonged QT and ST depression in multiple leads   Assessment/Plan Principal Problem:   NSTEMI (non-ST elevated myocardial infarction) Active Problems:   HFrEF (heart failure with reduced ejection fraction)   HTN (hypertension)   Crohn's disease (HCC)   Gastroesophageal reflux disease without esophagitis   Primary insomnia   Osteoarthritis   AKI (acute kidney injury) (HCC)   Failure to thrive in adult   Hypokalemia   Elevated troponin   Benign prostatic hyperplasia with nocturia   Mixed hyperlipidemia   Hyponatremia   History of recreational drug use   History of alcohol abuse   Hypocalcemia   History of prostate cancer   History of gout   Abnormal EKG   NSTEMI - pt presented with chest pain and progressive dyspnea - EKG with findings of ST depression in contiguous leads - initial high sensitivity troponin elevated at 315 - continue IV heparin infusion per cardiology - admit to continuous telemetry monitoring - no active chest pain at this time - aspirin 81 mg daily   Prolonged QT   - replace low magnesium and potassium - recheck serum levels in AM  AKI / dehydration  - suspect prerenal  - renally dose meds and hold nephrotoxic agents - follow daily BMP  - hold Entresto  - he was bolused IV fluids in ED  - check bladder scan and renal US with history of BPH  Hyponatremia  - suspect from dehydration  - hydrated in ED -  holding diuretics  Hypocalcemia  - mild, treating with oral tums  HFmrEF - LVEF 45-50% in September 2023 - updated TTE pending  Hyperlipidemia - resume daily rosuvastatin 10 mg  - lipid panel in AM   Primary hypertension  - BPs soft at this time - holding entresto due to AKI  - restart home carvedilol with hold parameters  Syncopal episodes - suspect from dehydration as he appears dry and prerenal - he was hydrated in ED - when medically stable obtain PT Eval - updated TTE pending   Gout  - no s/s of acute gout - resume home allopurinol   History of alcohol use - CIWA protocol  - supplement thiamine, folic acid, MVI - check UDS  History of recreational drug use - check UDS   BPH with nocturia - resume home nightly tamsulosin 0.4 mg  - bladder scan PRN and cath PRN if unable to void  DVT prophylaxis: IV heparin infusion   Code Status: Full   Family Communication:   Disposition Plan: TBD   Consults called: cardiology   Admission status: INP  Time spent: 63 mins    Level of care: Telemetry Standley Dakins MD [facility] How to contact the [facility] Attending or Consulting provider 7A - 7P or covering provider during after hours 7P -7A, for this patient?  Check the care team in [facility] and look for a) attending/consulting [facility] provider listed and b) the [facility] team listed Log into [email or web address] and use [place]'s universal password to access. If you do not have the password, please contact the hospital operator. Locate the [facility] provider you are looking for under [facility] and page to a number that you can be  directly reached. If you still have difficulty reaching the provider, please page the [facility] (Director on Call) for the Hospitalists listed on amion for assistance.   If 7PM-7AM, please contact night-coverage [email or web address] Password [email or web address]  04/08/2023, 1:08 PM

## 2023-04-08 NOTE — ED Provider Notes (Signed)
Jonathan Benitez AT [facility] Provider Note  CSN: [id number] Arrival date & time: 04/08/23 4098  Chief Complaint(s) Shortness of Breath  HPI Jonathan Benitez is a 72 y.o. male with past medical history as below, significant for GERD, hypertension, HCV, marijuana use, IDA, alcohol abuse who presents to the ED with complaint of dyspnea  Patient reports feeling short of breath for around 1 week, has been worsening.  Feels worse and he lies flat.  Has some chest tightness occ. When he stands up he has been feeling lightheaded over the past 2 days. Thinks he may have passed out during one of these episodes.  He is compliant with home medications, taking iron supplements.  Reports similar symptoms in the past associated with anemia.  Reports poor p.o. last few days secondary to poor appetite.  Denies any chest pain, nausea or vomiting.  No BRBPR or melena.  No significant weight changes.  No swelling, no coughing, hemoptysis, fevers or chills.  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   Patient Active Problem List   Diagnosis Date Noted   NSTEMI (non-ST elevated myocardial infarction) 04/08/2023   Hyponatremia 04/08/2023   History of recreational drug use 04/08/2023   History of alcohol abuse 04/08/2023   Hypocalcemia 04/08/2023   History of prostate cancer 04/08/2023   History of gout 04/08/2023   HFrEF (heart failure with reduced ejection fraction) 04/08/2023   Abnormal EKG 04/08/2023   Acute gout of left foot 01/31/2023   Benign prostatic hyperplasia with nocturia 01/31/2023   Mixed hyperlipidemia 01/31/2023   CAP (community acquired pneumonia) 11/08/2022   Acute mucoid otitis media of right ear 03/14/2022   Decreased appetite 02/27/2022   Superior mesenteric artery stenosis (HCC)  01/30/2022   Hospital discharge follow-up 01/30/2022   Proctitis 01/21/2022   Cardiomyopathy (HCC) 01/20/2022   AKI (acute kidney injury) (HCC) 01/19/2022   Failure to thrive in adult 01/19/2022   Marijuana abuse 01/19/2022   Hypokalemia 01/19/2022   Elevated troponin 01/19/2022   Osteoarthritis 05/01/2021   Acute idiopathic gout of left ankle 05/01/2021   Iron deficiency anemia due to chronic blood loss 02/14/2021   Neck pain 02/14/2021   Bunion of great toe of right foot 02/14/2021   Primary insomnia 09/07/2020   Chronic diarrhea 05/19/2020   Arthritis of knee 05/19/2020   Moderate protein-calorie malnutrition (HCC) 05/19/2020   Cyclic vomiting syndrome 05/19/2020   Rectal bleeding 03/25/2018   Avascular necrosis of hip, left (HCC) 11/12/2017   Gastroesophageal reflux disease without esophagitis 12/19/2016   Anemia 06/28/2012   Prostate cancer (HCC) 06/28/2012   HTN (hypertension) 06/28/2012   Crohn's disease (HCC) 06/28/2012   Alcoholism (HCC) 06/28/2012   Home Medication(s) Prior to Admission medications   Medication Sig Start Date End Date Taking? Authorizing Provider  allopurinol (ZYLOPRIM) 100 MG tablet Take 100 mg by mouth daily. 04/10/22  Yes [provider]  carvedilol (COREG) 6.25 MG tablet Take 1 tablet (6.25 mg total) by mouth 2 (two) times daily with a meal. 11/12/22  Yes [name], MD  cetirizine (ZYRTEC) 10 MG tablet Take 10 mg by mouth daily.   Yes [provider]  cyclobenzaprine (FLEXERIL) 10 MG tablet Take 10 mg by mouth 2 (two) times daily as needed for muscle spasms.  02/19/23  Yes [provider]  empagliflozin (JARDIANCE) 25 MG TABS tablet Take 12.5 mg by mouth daily. 02/12/22  Yes [provider]  fluticasone (FLONASE) 50 MCG/ACT nasal spray Place 2 sprays into both nostrils daily as needed for allergies.    Yes [provider]  hydrocortisone (ANUSOL-HC) 25 MG suppository Place 1 suppository (25 mg total)  rectally 2 (two) times daily as needed for hemorrhoids or anal itching. 01/23/22  Yes [name], MD  magnesium oxide (MAG-OX) 400 (240 Mg) MG tablet Take 1 tablet (400 mg total) by mouth 2 (two) times daily. 01/23/22  Yes [name], MD  omeprazole (PRILOSEC) 40 MG capsule Take 1 capsule (40 mg total) by mouth daily. 01/23/22  Yes [name], MD  rosuvastatin (CRESTOR) 10 MG tablet Take 1 tablet (10 mg total) by mouth daily. 01/30/22  Yes Patel, Earlie Lou, MD  sacubitril-valsartan (ENTRESTO) 24-26 MG Take 1 tablet by mouth 2 (two) times daily. 11/09/22  Yes Shah, [name], DO  sildenafil (VIAGRA) 100 MG tablet Take 50 mg by mouth as needed for erectile dysfunction. 06/07/22  Yes [provider]  tamsulosin (FLOMAX) 0.4 MG CAPS capsule Take 1 capsule (0.4 mg total) by mouth daily. 01/31/23  Yes [name], MD  thiamine (VITAMIN B-1) 100 MG tablet Take 1 tablet (100 mg total) by mouth daily. 01/24/22  Yes [name], MD  traZODone (DESYREL) 50 MG tablet Take 50 mg by mouth at bedtime as needed for sleep.   Yes [provider]  triamcinolone ointment (KENALOG) 0.1 % Apply 1 Application topically 4 (four) times daily as needed (itching). 06/21/22  Yes [provider]                                                                                                                                    Past Surgical History Past Surgical History:  Procedure Laterality Date   COLONOSCOPY N/A 03/30/2018   Procedure: COLONOSCOPY;  Surgeon: Malissa Hippo, MD;  [facility]: [facility];  Service: Endoscopy;  Laterality: N/A;  12:45   COLONOSCOPY WITH PROPOFOL N/A 01/23/2022   Procedure: COLONOSCOPY WITH PROPOFOL;  Surgeon: Lanelle Bal, DO;  Location: [facility];  Service: Endoscopy;  Laterality: N/A;   HERNIA REPAIR     [facility]   POLYPECTOMY  03/30/2018   Procedure: POLYPECTOMY;  Surgeon: Malissa Hippo, MD;  Location: [facility];  Service: Endoscopy;;   colon   PROSTATE BIOPSY     positive for cancer   TOTAL HIP ARTHROPLASTY Left    [facility] 11/07/17 Dr. Nancy Fetter   Family History Family History  Problem Relation Age of Onset   Hypertension Mother    Coronary artery disease Mother    Hypertension Father    Stroke Father    Colon cancer Neg Hx    Colon polyps Neg Hx     Social History Social  History   Tobacco Use   Smoking status: Never   Smokeless tobacco: Never  Vaping Use   Vaping status: Never Used  Substance Use Topics   Alcohol use: Yes    Comment: was drinking 3-4 beers a day   Drug use: Yes    Types: Marijuana    Comment: Smokes a joint every day   Allergies Nsaids  Review of Systems Review of Systems  Constitutional:  Positive for appetite change. Negative for chills, fever and unexpected weight change.  Respiratory:  Positive for chest tightness and shortness of breath.   Cardiovascular:  Negative for chest pain and leg swelling.  Gastrointestinal:  Negative for abdominal pain.  Genitourinary:  Negative for difficulty urinating.  Musculoskeletal:  Negative for myalgias.  Skin:  Negative for rash.  Neurological:  Negative for light-headedness.  All other systems reviewed and are negative.   Physical Exam Vital Signs  I have reviewed the triage vital signs BP 110/81   Pulse 79   Temp 98 F (36.7 C) (Oral)   Resp 18   Ht 6' (1.829 m)   Wt 88.5 kg   SpO2 96%   BMI 26.46 kg/m  Physical Exam Vitals and nursing note reviewed.  Constitutional:      General: He is not in acute distress.    Appearance: He is well-developed.     Comments: Frail  HENT:     Head: Normocephalic and atraumatic.     Right Ear: External ear normal.     Left Ear: External ear normal.     Mouth/Throat:     Mouth: Mucous membranes are dry.  Eyes:     General: No scleral icterus. Cardiovascular:     Rate and Rhythm: Regular rhythm. Tachycardia present.     Pulses: Normal pulses.     Heart sounds: Normal heart  sounds.  Pulmonary:     Effort: Pulmonary effort is normal. No respiratory distress.     Breath sounds: Normal breath sounds. No wheezing.  Abdominal:     General: Abdomen is flat.     Palpations: Abdomen is soft.     Tenderness: There is no abdominal tenderness.  Musculoskeletal:     Cervical back: No rigidity.     Right lower leg: No edema.     Left lower leg: No edema.  Skin:    General: Skin is warm and dry.     Capillary Refill: Capillary refill takes less than 2 seconds.  Neurological:     Mental Status: He is alert and oriented to person, place, and time.     GCS: GCS eye subscore is 4. GCS verbal subscore is 5. GCS motor subscore is 6.     Cranial Nerves: No dysarthria or facial asymmetry.     Motor: No tremor.     Coordination: Coordination is intact.  Psychiatric:        Mood and Affect: Mood normal.        Behavior: Behavior normal.     ED Results and Treatments Labs (all labs ordered are listed, but only abnormal results are displayed) Labs Reviewed  COMPREHENSIVE METABOLIC PANEL - Abnormal; Notable for the following components:      Result Value   Sodium 131 (*)    Potassium 2.5 (*)    Chloride 97 (*)    CO2 19 (*)    Glucose, Bld 103 (*)    Creatinine, Ser 2.28 (*)    Calcium 8.8 (*)    Total Protein 9.2 (*)  AST 58 (*)    GFR, Estimated 30 (*)    All other components within normal limits  MAGNESIUM - Abnormal; Notable for the following components:   Magnesium 1.2 (*)    All other components within normal limits  TROPONIN I (HIGH SENSITIVITY) - Abnormal; Notable for the following components:   Troponin I (High Sensitivity) 315 (*)    All other components within normal limits  TROPONIN I (HIGH SENSITIVITY) - Abnormal; Notable for the following components:   Troponin I (High Sensitivity) 85 (*)    All other components within normal limits  RESP PANEL BY RT-PCR (RSV, FLU A&B, COVID)  RVPGX2  CBC WITH DIFFERENTIAL/PLATELET  BRAIN NATRIURETIC PEPTIDE   LIPASE, BLOOD  HEPARIN LEVEL (UNFRACTIONATED)  URINALYSIS, ROUTINE W REFLEX MICROSCOPIC  RAPID URINE DRUG SCREEN, HOSP PERFORMED                                                                                                                          Radiology DG Chest Port 1 View  Result Date: 04/08/2023 CLINICAL DATA:  Shortness of breath and chest pain for 1 week. Anemia. EXAM: PORTABLE CHEST 1 VIEW COMPARISON:  11/07/2022 FINDINGS: The heart size and mediastinal contours are within normal limits. Mild bibasilar scarring again noted. Both lungs are otherwise clear. The visualized skeletal structures are unremarkable. IMPRESSION: No active disease. Electronically Signed   By: Danae Orleans M.D.   On: 04/08/2023 08:17    Pertinent labs & imaging results that were available during my care of the patient were reviewed by me and considered in my medical decision making (see MDM for details).  Medications Ordered in ED Medications  potassium chloride SA (KLOR-CON M) CR tablet 40 mEq (40 mEq Oral Given 04/08/23 0921)  heparin ADULT infusion 100 units/mL (25000 units/283mL) (1,200 Units/hr Intravenous New Bag/Given 04/08/23 0958)  rosuvastatin (CRESTOR) tablet 10 mg (10 mg Oral Given 04/08/23 1219)  aspirin EC tablet 81 mg (has no administration in time range)  magnesium sulfate IVPB 2 g 50 mL (has no administration in time range)  allopurinol (ZYLOPRIM) tablet 100 mg (has no administration in time range)  carvedilol (COREG) tablet 3.125 mg (has no administration in time range)  traZODone (DESYREL) tablet 25 mg (has no administration in time range)  pantoprazole (PROTONIX) EC tablet 40 mg (has no administration in time range)  tamsulosin (FLOMAX) capsule 0.4 mg (has no administration in time range)  fluticasone (FLONASE) 50 MCG/ACT nasal spray 2 spray (has no administration in time range)  loratadine (CLARITIN) tablet 10 mg (has no administration in time range)  acetaminophen (TYLENOL) tablet  650 mg (has no administration in time range)    Or  acetaminophen (TYLENOL) suppository 650 mg (has no administration in time range)  oxyCODONE (Oxy IR/ROXICODONE) immediate release tablet 5 mg (has no administration in time range)  fentaNYL (SUBLIMAZE) injection 12.5 mcg (has no administration in time range)  bisacodyl (DULCOLAX) EC tablet 5 mg (has no administration in time  range)  prochlorperazine (COMPAZINE) injection 10 mg (has no administration in time range)  LORazepam (ATIVAN) tablet 1-4 mg (has no administration in time range)    Or  LORazepam (ATIVAN) injection 1-4 mg (has no administration in time range)  thiamine (VITAMIN B1) tablet 100 mg (has no administration in time range)    Or  thiamine (VITAMIN B1) injection 100 mg (has no administration in time range)  folic acid (FOLVITE) tablet 1 mg (has no administration in time range)  multivitamin with minerals tablet 1 tablet (has no administration in time range)  LORazepam (ATIVAN) tablet 0-4 mg (has no administration in time range)    Followed by  LORazepam (ATIVAN) tablet 0-4 mg (has no administration in time range)  calcium carbonate (TUMS - dosed in mg elemental calcium) chewable tablet 200 mg of elemental calcium (has no administration in time range)  sodium chloride 0.9 % bolus 1,000 mL (0 mLs Intravenous Stopped 04/08/23 1000)  ondansetron (ZOFRAN) injection 4 mg (4 mg Intravenous Given 04/08/23 0832)  pantoprazole (PROTONIX) injection 80 mg (80 mg Intravenous Given 04/08/23 0833)  magnesium sulfate IVPB 2 g 50 mL (0 g Intravenous Stopped 04/08/23 1002)  aspirin EC tablet 325 mg (325 mg Oral Given 04/08/23 0908)  sodium chloride 0.9 % bolus 1,000 mL (0 mLs Intravenous Stopped 04/08/23 1030)  heparin bolus via infusion 4,000 Units (4,000 Units Intravenous Bolus from Bag 04/08/23 0959)                                                                                                                                      Procedures .Critical Care  Performed by: Sloan Leiter, DO Authorized by: Sloan Leiter, DO   Critical care provider statement:    Critical care time (minutes):  60   Critical care was necessary to treat or prevent imminent or life-threatening deterioration of the following conditions:  Cardiac failure, renal failure and dehydration   Critical care was time spent personally by me on the following activities:  Development of treatment plan with patient or surrogate, discussions with consultants, evaluation of patient's response to treatment, examination of patient, ordering and review of laboratory studies, ordering and review of radiographic studies, ordering and performing treatments and interventions, pulse oximetry, re-evaluation of patient's condition, review of old charts and obtaining history from patient or surrogate   Care discussed with: admitting provider     (including critical care time)  Medical Decision Making / ED Course    Medical Decision Making:    Rykin COBAIN KRIKORIAN is a 72 y.o. male with past medical history as below, significant for GERD, hypertension, HCV, marijuana use, IDA, alcohol abuse who presents to the ED with complaint of dyspnea. The complaint involves an extensive differential diagnosis and also carries with it a high risk of complications and morbidity.  Serious etiology was considered. Ddx includes but is not limited to: In my evaluation of  this patient's dyspnea my DDx includes, but is not limited to, pneumonia, pulmonary embolism, pneumothorax, pulmonary edema, metabolic acidosis, asthma, COPD, cardiac cause, anemia, anxiety, etc.    Complete initial physical exam performed, notably the patient was in no acute distress, blood pressure mildly low on arrival and tachycardic.  Afebrile..    Reviewed and confirmed nursing documentation for past medical history, family history, social history.  Vital signs reviewed.    Will collect labs, x-ray, give IV  fluids.  Clinical Course as of 04/08/23 1441  Tue Apr 08, 2023  0820 CXR stable [SG]  0826 Pt now vomiting, give zofran.  [SG]  0844 Creatinine(!): 2.28 AKI [SG]  0844 Potassium(!!): 2.5 Replete, check mag; he is on oral K at home [SG]  0844 Troponin I (High Sensitivity)(!!): 315 EKG with diffuse TWI, he is not having chest pain currently but having some dyspnea and occ chest tightness, nausea/vomit.  History of prior GI bleed, no evidence of hemorrhage currently, hemoglobin stable.  Will give aspirin. [SG]  0845 ECHO 9/23 LVEF 45-50%  [SG]  0849 Care primarily at [facility] [SG]  1216 Discussed with Dr Wyline Mood, agree with continuing heparin, they will see in consult, patient will need fluid resuscitation and improvement to his renal function prior to [facility]. Will admit to [facility] [SG]  1216 Troponin I (High Sensitivity)(!): 85 Improved, rpt EKG similar  [SG]    Clinical Course User Index [SG] Sloan Leiter, DO    Brief summary: 72 year old male history of AKI, iron deficiency anemia here with dyspnea, lightheaded.  Found have AKI, potassium is also low.  Replacement in process.  EKG with diffuse T wave inversions, he has chest tightness but no chest pain, he is having nausea and dyspnea.  Troponin elevated 315.  Will discuss with cardiology.  See above, they will see in consult, continue heparin.  Patient with AKI, NSTEMI, hypokalemia.  Will plan to admit to hospitalist service with cardiology consult                Additional history obtained: -Additional history obtained from na -External records from outside source obtained and reviewed including: Chart review including previous notes, labs, imaging, consultation notes including  Primary care recommendation, home medications   Lab Tests: -I ordered, reviewed, and interpreted labs.   The pertinent results include:   Labs Reviewed  COMPREHENSIVE METABOLIC PANEL - Abnormal; Notable for the following components:      Result Value    Sodium 131 (*)    Potassium 2.5 (*)    Chloride 97 (*)    CO2 19 (*)    Glucose, Bld 103 (*)    Creatinine, Ser 2.28 (*)    Calcium 8.8 (*)    Total Protein 9.2 (*)    AST 58 (*)    GFR, Estimated 30 (*)    All other components within normal limits  MAGNESIUM - Abnormal; Notable for the following components:   Magnesium 1.2 (*)    All other components within normal limits  TROPONIN I (HIGH SENSITIVITY) - Abnormal; Notable for the following components:   Troponin I (High Sensitivity) 315 (*)    All other components within normal limits  TROPONIN I (HIGH SENSITIVITY) - Abnormal; Notable for the following components:   Troponin I (High Sensitivity) 85 (*)    All other components within normal limits  RESP PANEL BY RT-PCR (RSV, FLU A&B, COVID)  RVPGX2  CBC WITH DIFFERENTIAL/PLATELET  BRAIN NATRIURETIC PEPTIDE  LIPASE, BLOOD  HEPARIN LEVEL (  UNFRACTIONATED)  URINALYSIS, ROUTINE W REFLEX MICROSCOPIC  RAPID URINE DRUG SCREEN, HOSP PERFORMED    Notable for as above, troponin elevated, AKI, hypokalemia  EKG   EKG Interpretation Date/Time:  Tuesday April 08 2023 11:14:43 EST Ventricular Rate:  76 PR Interval:  168 QRS Duration:  104 QT Interval:  526 QTC Calculation: 592 R Axis:   -49  Text Interpretation: Sinus rhythm Left anterior fascicular block Abnormal R-wave progression, late transition Repol abnrm, global ischemia, diffuse leads Prolonged QT interval Confirmed by Tanda Rockers ([id number]) on 04/08/2023 12:15:40 PM         Imaging Studies ordered: I ordered imaging studies including CXR I independently visualized the following imaging with scope of interpretation limited to determining acute life threatening conditions related to emergency care; findings noted above I independently visualized and interpreted imaging. I agree with the radiologist interpretation   Medicines ordered and prescription drug management: Meds ordered this encounter  Medications   sodium  chloride 0.9 % bolus 1,000 mL   ondansetron (ZOFRAN) injection 4 mg   pantoprazole (PROTONIX) injection 80 mg   DISCONTD: potassium chloride 10 mEq in 100 mL IVPB   magnesium sulfate IVPB 2 g 50 mL   DISCONTD: ipratropium-albuterol (DUONEB) 0.5-2.5 (3) MG/3ML nebulizer solution 3 mL   aspirin EC tablet 325 mg   potassium chloride SA (KLOR-CON M) CR tablet 40 mEq   sodium chloride 0.9 % bolus 1,000 mL   heparin bolus via infusion 4,000 Units   heparin ADULT infusion 100 units/mL (25000 units/236mL)   rosuvastatin (CRESTOR) tablet 10 mg   aspirin EC tablet 81 mg   magnesium sulfate IVPB 2 g 50 mL   allopurinol (ZYLOPRIM) tablet 100 mg   carvedilol (COREG) tablet 3.125 mg   traZODone (DESYREL) tablet 25 mg   pantoprazole (PROTONIX) EC tablet 40 mg   tamsulosin (FLOMAX) capsule 0.4 mg   fluticasone (FLONASE) 50 MCG/ACT nasal spray 2 spray   loratadine (CLARITIN) tablet 10 mg   OR Linked Order Group    acetaminophen (TYLENOL) tablet 650 mg    acetaminophen (TYLENOL) suppository 650 mg   oxyCODONE (Oxy IR/ROXICODONE) immediate release tablet 5 mg   fentaNYL (SUBLIMAZE) injection 12.5 mcg   bisacodyl (DULCOLAX) EC tablet 5 mg   prochlorperazine (COMPAZINE) injection 10 mg   OR Linked Order Group    LORazepam (ATIVAN) tablet 1-4 mg     Order Specific Question:   CIWA-AR < 5 =     Answer:   0 mg     Order Specific Question:   CIWA-AR 5 -10 =     Answer:   1 mg     Order Specific Question:   CIWA-AR 11 -15 =     Answer:   2 mg     Order Specific Question:   CIWA-AR 16 -20 =     Answer:   3 mg     Order Specific Question:   CIWA-AR 16 -20 =     Answer:   Recheck CIWA-AR in 1 hour; if > 20 notify MD     Order Specific Question:   CIWA-AR > 20 =     Answer:   4 mg     Order Specific Question:   CIWA-AR > 20 =     Answer:   Call Rapid Response    LORazepam (ATIVAN) injection 1-4 mg     Order Specific Question:   CIWA-AR < 5 =     Answer:   0 mg  Order Specific Question:   CIWA-AR  5 -10 =     Answer:   1 mg     Order Specific Question:   CIWA-AR 11 -15 =     Answer:   2 mg     Order Specific Question:   CIWA-AR 16 -20 =     Answer:   3 mg     Order Specific Question:   CIWA-AR 16 -20 =     Answer:   Recheck CIWA-AR in 1 hour; if > 20 notify MD     Order Specific Question:   CIWA-AR > 20 =     Answer:   4 mg     Order Specific Question:   CIWA-AR > 20 =     Answer:   Call Rapid Response   OR Linked Order Group    thiamine (VITAMIN B1) tablet 100 mg    thiamine (VITAMIN B1) injection 100 mg   folic acid (FOLVITE) tablet 1 mg   multivitamin with minerals tablet 1 tablet   FOLLOWED BY Linked Order Group    LORazepam (ATIVAN) tablet 0-4 mg     Order Specific Question:   CIWA-AR < 5 =     Answer:   0 mg     Order Specific Question:   CIWA-AR 5 -10 =     Answer:   1 mg     Order Specific Question:   CIWA-AR 11 -15 =     Answer:   2 mg     Order Specific Question:   CIWA-AR 16 -20 =     Answer:   3 mg     Order Specific Question:   CIWA-AR 16 -20 =     Answer:   Recheck CIWA-AR in 1 hour; if > 20 notify MD     Order Specific Question:   CIWA-AR > 20 =     Answer:   4 mg     Order Specific Question:   CIWA-AR > 20 =     Answer:   Call Rapid Response    LORazepam (ATIVAN) tablet 0-4 mg     Order Specific Question:   CIWA-AR < 5 =     Answer:   0 mg     Order Specific Question:   CIWA-AR 5 -10 =     Answer:   1 mg     Order Specific Question:   CIWA-AR 11 -15 =     Answer:   2 mg     Order Specific Question:   CIWA-AR 16 -20 =     Answer:   3 mg     Order Specific Question:   CIWA-AR 16 -20 =     Answer:   Recheck CIWA-AR in 1 hour; if > 20 notify MD     Order Specific Question:   CIWA-AR > 20 =     Answer:   4 mg     Order Specific Question:   CIWA-AR > 20 =     Answer:   Call Rapid Response   calcium carbonate (TUMS - dosed in mg elemental calcium) chewable tablet 200 mg of elemental calcium    -I have reviewed the patients home medicines and have made  adjustments as needed   Consultations Obtained: I requested consultation with the cardiology Dr Wyline Mood,  and discussed lab and imaging findings as well as pertinent plan - they recommend: will consult, echo, heparin   Cardiac Monitoring: The patient was  maintained on a cardiac monitor.  I personally viewed and interpreted the cardiac monitored which showed an underlying rhythm of: NSR Continuous pulse oximetry interpreted by myself, 97% on RA.    Social Determinants of Health:  Diagnosis or treatment significantly limited by social determinants of health: lives alone, etoh abuse   Reevaluation: After the interventions noted above, I reevaluated the patient and found that they have improved  Co morbidities that complicate the patient evaluation  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      Dispostion: Disposition decision including need for hospitalization was considered, and patient admitted to the hospital.    Final Clinical Impression(s) / ED Diagnoses Final diagnoses:  Hypokalemia  NSTEMI (non-ST elevated myocardial infarction) (HCC)  AKI (acute kidney injury) (HCC)        Sloan Leiter, DO 04/08/23 1441

## 2023-04-08 NOTE — Progress Notes (Signed)
*  PRELIMINARY RESULTS* Echocardiogram 2D Echocardiogram has been performed.  Jonathan Benitez 04/08/2023, 3:59 PM

## 2023-04-08 NOTE — ED Triage Notes (Signed)
Pt c/o of SOB for over a week. Pt states previous having this same episode and being anemic. Pt denies pain, N/V, or being around anyone sick.

## 2023-04-08 NOTE — Hospital Course (Addendum)
72 year old male with hypertension, history of alcohol use (3-4 beers/day), history of hepatitis C and history of prostate cancer s/p radiotherapy, HFmrEF, GERD, hyperlipidemia, s/p left hip replacement, elevated LFTs, h/o recreational substance use (marijuana), recurrent bouts of hyponatremia and hypokalemia presented to ED today complaining of intermittent chest pain and progressive shortness of breath for at least 1 week.  He also reported he passed out a couple of times in the last week.  He was found to have an abnormal EKG with prolonged QT, ST wave depression and elevated troponin of 315.  His BNP was 58.  He hg was 14.8.  He was started on IV heparin and was seen by cardiology.  His electrolytes were abnormal with magnesium of 1.2 and potassium of 2.5 with a sodium of 131.  His chest xray was unrevealing.  He viral testing was negative for Covid, influenza and other respiratory viruses.  His renal function was noted to be abnormal with a creatinine of 2.28 which is up from 1.03 on 02/25/23.  Cardiology requested admission for IV heparin and are considering sending for cardiac catheterization.

## 2023-04-09 ENCOUNTER — Inpatient Hospital Stay (HOSPITAL_COMMUNITY)
Admission: EM | Disposition: A | Discharge status: Home / Self Care | Payer: Self-pay | Source: Other Acute Inpatient Hospital | Attending: Internal Medicine

## 2023-04-09 ENCOUNTER — Encounter (HOSPITAL_COMMUNITY): Payer: Self-pay | Admitting: Cardiology

## 2023-04-09 DIAGNOSIS — E876 Hypokalemia: Secondary | ICD-10-CM | POA: Diagnosis not present

## 2023-04-09 DIAGNOSIS — I2510 Atherosclerotic heart disease of native coronary artery without angina pectoris: Secondary | ICD-10-CM

## 2023-04-09 DIAGNOSIS — N179 Acute kidney failure, unspecified: Secondary | ICD-10-CM | POA: Diagnosis not present

## 2023-04-09 DIAGNOSIS — I214 Non-ST elevation (NSTEMI) myocardial infarction: Secondary | ICD-10-CM | POA: Diagnosis not present

## 2023-04-09 HISTORY — PX: LEFT HEART CATH AND CORONARY ANGIOGRAPHY: CATH118249

## 2023-04-09 LAB — URINALYSIS, ROUTINE W REFLEX MICROSCOPIC
Bacteria, UA: NONE SEEN
Bilirubin Urine: NEGATIVE
Glucose, UA: >=500 mg/dL — AB
Hgb urine dipstick: NEGATIVE
Ketones, ur: 5 mg/dL — AB
Leukocytes,Ua: NEGATIVE
Nitrite: NEGATIVE
Protein, ur: NEGATIVE mg/dL
Specific Gravity, Urine: 1.016 (ref 1.005–1.030)
pH: 5 (ref 5.0–8.0)

## 2023-04-09 LAB — CBC
HCT: 36.1 % — ABNORMAL LOW (ref 39.0–52.0)
HCT: 36.5 % — ABNORMAL LOW (ref 39.0–52.0)
Hemoglobin: 11.6 g/dL — ABNORMAL LOW (ref 13.0–17.0)
Hemoglobin: 11.8 g/dL — ABNORMAL LOW (ref 13.0–17.0)
MCH: 31.9 pg (ref 26.0–34.0)
MCH: 32.2 pg (ref 26.0–34.0)
MCHC: 32.1 g/dL (ref 30.0–36.0)
MCHC: 32.3 g/dL (ref 30.0–36.0)
MCV: 99.2 fL (ref 80.0–100.0)
MCV: 99.5 fL (ref 80.0–100.0)
Platelets: 178 10*3/uL (ref 150–400)
Platelets: 182 10*3/uL (ref 150–400)
RBC: 3.64 MIL/uL — ABNORMAL LOW (ref 4.22–5.81)
RBC: 3.67 MIL/uL — ABNORMAL LOW (ref 4.22–5.81)
RDW: 15 % (ref 11.5–15.5)
RDW: 15 % (ref 11.5–15.5)
WBC: 5.3 10*3/uL (ref 4.0–10.5)
WBC: 6.4 10*3/uL (ref 4.0–10.5)
nRBC: 0 % (ref 0.0–0.2)
nRBC: 0 % (ref 0.0–0.2)

## 2023-04-09 LAB — CREATININE, SERUM
Creatinine, Ser: 1.13 mg/dL (ref 0.61–1.24)
GFR, Estimated: >60 mL/min (ref >60)

## 2023-04-09 LAB — BASIC METABOLIC PANEL
Anion gap: 9 (ref 5–15)
BUN: 15 mg/dL (ref 8–23)
CO2: 21 mmol/L — ABNORMAL LOW (ref 22–32)
Calcium: 8 mg/dL — ABNORMAL LOW (ref 8.9–10.3)
Chloride: 105 mmol/L (ref 98–111)
Creatinine, Ser: 1.26 mg/dL — ABNORMAL HIGH (ref 0.61–1.24)
GFR, Estimated: >60 mL/min (ref >60)
Glucose, Bld: 94 mg/dL (ref 70–99)
Potassium: 3.2 mmol/L — ABNORMAL LOW (ref 3.5–5.1)
Sodium: 135 mmol/L (ref 135–145)

## 2023-04-09 LAB — RAPID URINE DRUG SCREEN, HOSP PERFORMED
Amphetamines: NOT DETECTED
Barbiturates: NOT DETECTED
Benzodiazepines: NOT DETECTED
Cocaine: NOT DETECTED
Opiates: NOT DETECTED
Tetrahydrocannabinol: POSITIVE — AB

## 2023-04-09 LAB — HEPARIN LEVEL (UNFRACTIONATED)
Heparin Unfractionated: 0.68 [IU]/mL (ref 0.30–0.70)
Heparin Unfractionated: 0.76 [IU]/mL — ABNORMAL HIGH (ref 0.30–0.70)

## 2023-04-09 LAB — MAGNESIUM: Magnesium: 1.8 mg/dL (ref 1.7–2.4)

## 2023-04-09 SURGERY — LEFT HEART CATH AND CORONARY ANGIOGRAPHY
Anesthesia: LOCAL

## 2023-04-09 MED ORDER — IOHEXOL 350 MG/ML SOLN
INTRAVENOUS | Status: DC | PRN
  Administered 2023-04-09: 45 mL via INTRA_ARTERIAL

## 2023-04-09 MED ORDER — POTASSIUM CHLORIDE CRYS ER 20 MEQ PO TBCR
40.0000 meq | EXTENDED_RELEASE_TABLET | Freq: Four times a day (QID) | ORAL | Status: AC
  Administered 2023-04-09 (×2): 40 meq via ORAL
  Filled 2023-04-09 (×2): qty 2

## 2023-04-09 MED ORDER — SODIUM CHLORIDE 0.9% FLUSH
3.0000 mL | Freq: Two times a day (BID) | INTRAVENOUS | Status: DC
  Administered 2023-04-10 – 2023-04-11 (×2): 3 mL via INTRAVENOUS

## 2023-04-09 MED ORDER — SODIUM CHLORIDE 0.9 % WEIGHT BASED INFUSION: 1.0000 mL/kg/h | INTRAVENOUS | Status: AC

## 2023-04-09 MED ORDER — LIDOCAINE HCL (PF) 1 % IJ SOLN
INTRAMUSCULAR | Status: DC | PRN
  Administered 2023-04-09: 10 mL

## 2023-04-09 MED ORDER — SODIUM CHLORIDE 0.9% FLUSH: 3.0000 mL | INTRAVENOUS | Status: DC | PRN

## 2023-04-09 MED ORDER — HEPARIN SODIUM (PORCINE) 1000 UNIT/ML IJ SOLN
INTRAMUSCULAR | Status: AC
  Filled 2023-04-09: qty 10

## 2023-04-09 MED ORDER — SODIUM CHLORIDE 0.9 % IV SOLN
INTRAVENOUS | Status: DC
Start: 2023-04-09 — End: 2023-04-09

## 2023-04-09 MED ORDER — VERAPAMIL HCL 2.5 MG/ML IV SOLN
INTRAVENOUS | Status: AC
  Filled 2023-04-09: qty 2

## 2023-04-09 MED ORDER — LIDOCAINE HCL (PF) 1 % IJ SOLN
INTRAMUSCULAR | Status: AC
  Filled 2023-04-09: qty 30

## 2023-04-09 MED ORDER — SODIUM CHLORIDE 0.9 % IV SOLN: INTRAVENOUS | Status: DC

## 2023-04-09 MED ORDER — SODIUM CHLORIDE 0.9 % IV SOLN: 250.0000 mL | INTRAVENOUS | Status: AC | PRN

## 2023-04-09 MED ORDER — HEPARIN (PORCINE) IN NACL 1000-0.9 UT/500ML-% IV SOLN
INTRAVENOUS | Status: DC | PRN
  Administered 2023-04-09: 1000 mL via INTRA_ARTERIAL

## 2023-04-09 MED ORDER — MAGNESIUM SULFATE 2 GM/50ML IV SOLN
2.0000 g | Freq: Once | INTRAVENOUS | Status: AC
  Administered 2023-04-09: 2 g via INTRAVENOUS
  Filled 2023-04-09: qty 50

## 2023-04-09 MED ORDER — MIDAZOLAM HCL 2 MG/2ML IJ SOLN
INTRAMUSCULAR | Status: AC
Start: 2023-04-09 — End: ?
  Filled 2023-04-09: qty 2

## 2023-04-09 MED ORDER — MIDAZOLAM HCL 2 MG/2ML IJ SOLN
INTRAMUSCULAR | Status: DC | PRN
  Administered 2023-04-09: 1 mg via INTRAVENOUS

## 2023-04-09 MED ORDER — FENTANYL CITRATE (PF) 100 MCG/2ML IJ SOLN
INTRAMUSCULAR | Status: DC | PRN
  Administered 2023-04-09: 25 ug via INTRAVENOUS

## 2023-04-09 MED ORDER — HEPARIN SODIUM (PORCINE) 5000 UNIT/ML IJ SOLN
5000.0000 [IU] | Freq: Three times a day (TID) | INTRAMUSCULAR | Status: DC
  Administered 2023-04-09 – 2023-04-11 (×5): 5000 [IU] via SUBCUTANEOUS
  Filled 2023-04-09 (×5): qty 1

## 2023-04-09 MED ORDER — FENTANYL CITRATE (PF) 100 MCG/2ML IJ SOLN
INTRAMUSCULAR | Status: AC
  Filled 2023-04-09: qty 2

## 2023-04-09 SURGICAL SUPPLY — 11 items
CATH INFINITI 5FR MULTPACK ANG (CATHETERS) IMPLANT
CLOSURE MYNX CONTROL 5F (Vascular Products) IMPLANT
GLIDESHEATH SLEND SS 6F .021 (SHEATH) IMPLANT
GUIDEWIRE INQWIRE 1.5J.035X260 (WIRE) IMPLANT
INQWIRE 1.5J .035X260CM (WIRE) ×1
KIT MICROPUNCTURE NIT STIFF (SHEATH) IMPLANT
PACK CARDIAC CATHETERIZATION (CUSTOM PROCEDURE TRAY) ×2 IMPLANT
SET ATX-X65L (MISCELLANEOUS) IMPLANT
SHEATH PINNACLE 5F 10CM (SHEATH) IMPLANT
SHEATH PROBE COVER 6X72 (BAG) IMPLANT
WIRE EMERALD 3MM-J .035X150CM (WIRE) IMPLANT

## 2023-04-09 NOTE — Progress Notes (Signed)
PHARMACY - ANTICOAGULATION CONSULT NOTE  Pharmacy Consult for heparin Indication: NSTEMI  Allergies  Allergen Reactions   Nsaids Other (See Comments)     Anemia, Gastrointestinal hemorrhage    Patient Measurements: Height: 6' (182.9 cm) Weight: 88 kg (194 lb 0.1 oz) IBW/kg (Calculated) : 77.6 Heparin Dosing Weight: 88.5 kg  Vital Signs: Temp: 98.4 F (36.9 C) (11/27 0459) Temp Source: Oral (11/27 0459) BP: 109/72 (11/27 0459) Pulse Rate: 82 (11/27 0459)  Labs: Recent Labs    04/08/23 0752 04/08/23 0951 04/08/23 1841 04/09/23 0406 04/09/23 1015  HGB 14.8  --   --  11.6*  --   HCT 44.6  --   --  36.1*  --   PLT 246  --   --  178  --   HEPARINUNFRC  --   --  0.90* 0.68 0.76*  CREATININE 2.28*  --   --  1.26*  --   TROPONINIHS 315* 85*  --   --   --     Estimated Creatinine Clearance: 57.3 mL/min (A) (by C-G formula based on SCr of 1.26 mg/dL (H)).   Medical History: 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    Medications:  Medications Prior to Admission  Medication Sig Dispense Refill Last Dose   allopurinol (ZYLOPRIM) 100 MG tablet Take 100 mg by mouth daily.   04/07/2023   carvedilol (COREG) 6.25 MG tablet Take 1 tablet (6.25 mg total) by mouth 2 (two) times daily with a meal. 60 tablet 1 04/07/2023 at unk   cetirizine (ZYRTEC) 10 MG tablet Take 10 mg by mouth daily.   04/07/2023   cyclobenzaprine (FLEXERIL) 10 MG tablet Take 10 mg by mouth 2 (two) times daily as needed for muscle spasms.   04/08/2023   empagliflozin (JARDIANCE) 25 MG TABS tablet Take 12.5 mg by mouth daily.   04/07/2023   fluticasone (FLONASE) 50 MCG/ACT nasal spray Place 2 sprays into both nostrils daily as needed for allergies.    Past Month   hydrocortisone (ANUSOL-HC) 25 MG suppository Place 1 suppository (25 mg total) rectally 2 (two)  times daily as needed for hemorrhoids or anal itching. 12 suppository 0 Past Month   magnesium oxide (MAG-OX) 400 (240 Mg) MG tablet Take 1 tablet (400 mg total) by mouth 2 (two) times daily. 30 tablet 1 04/07/2023   omeprazole (PRILOSEC) 40 MG capsule Take 1 capsule (40 mg total) by mouth daily. 30 capsule 3 04/07/2023   rosuvastatin (CRESTOR) 10 MG tablet Take 1 tablet (10 mg total) by mouth daily. 30 tablet 5 04/07/2023   sacubitril-valsartan (ENTRESTO) 24-26 MG Take 1 tablet by mouth 2 (two) times daily. 60 tablet 2 04/07/2023   sildenafil (VIAGRA) 100 MG tablet Take 50 mg by mouth as needed for erectile dysfunction.   Past Month   tamsulosin (FLOMAX) 0.4 MG CAPS capsule Take 1 capsule (0.4 mg total) by mouth daily. 30 capsule 5 04/07/2023   thiamine (VITAMIN B-1) 100 MG tablet Take 1 tablet (100 mg total) by mouth daily. 30 tablet 2 04/07/2023   traZODone (DESYREL) 50 MG tablet Take 50 mg by mouth at bedtime as needed for sleep.   Past Week   triamcinolone ointment (KENALOG) 0.1 % Apply 1 Application topically 4 (four) times daily as needed (itching).   Past Week  Assessment: 72 yo male with h/o AKI, iron deficiency anemia here with dyspnea, lightheadedness and chest tightness. EKG shows diffuse T wave inversion and troponin elevated to 315. NSTEMI- patient transferring to [facility] for cath.  HL 0.76- slightly supratherapeutic CBC WNL  Goal of Therapy:  Heparin level 0.3-0.7 units/ml Monitor platelets by anticoagulation protocol: Yes   Plan:  Decrease heparin infusion rate to 950 units/hr Check heparin level in 8 hours and daily while on heparin Continue to monitor CBC and signs/symptoms of bleeding daily  Tad Moore PharmD  Clinical Pharmacist 04/09/2023,10:45 AM

## 2023-04-09 NOTE — Interval H&P Note (Signed)
History and Physical Interval Note:  04/09/2023 2:11 PM  Jonathan Benitez  has presented today for surgery, with the diagnosis of nstemi.  The various methods of treatment have been discussed with the patient and family. After consideration of risks, benefits and other options for treatment, the patient has consented to  Procedure(s): LEFT HEART CATH AND CORONARY ANGIOGRAPHY (N/A) as a surgical intervention.  The patient's history has been reviewed, patient examined, no change in status, stable for surgery.  I have reviewed the patient's chart and labs.  Questions were answered to the patient's satisfaction.    Cath Lab Visit (complete for each Cath Lab visit)  Clinical Evaluation Leading to the Procedure:   ACS: Yes.    Non-ACS:    Anginal Classification: CCS IV  Anti-ischemic medical therapy: Minimal Therapy (1 class of medications)  Non-Invasive Test Results: No non-invasive testing performed  Prior CABG: No previous CABG       Theron Arista [facility] 04/09/2023 2:11 PM

## 2023-04-09 NOTE — Progress Notes (Addendum)
Rounding Note    Patient Name: Jonathan Benitez Date of Encounter: 04/09/2023  [facility] Cardiologist: Nona Dell, MD   Subjective   Reports some episodes of sharp chest pain overnight but would spontaneously resolve within 1 minute. Breathing improved. Has been NPO since midnight for possible cardiac cath today.   Inpatient Medications    Scheduled Meds:  allopurinol  100 mg Oral Daily   aspirin EC  81 mg Oral Daily   calcium carbonate  1 tablet Oral BID WC   carvedilol  3.125 mg Oral BID WC   folic acid  1 mg Oral Daily   influenza vaccine adjuvanted  0.5 mL Intramuscular Tomorrow-1000   LORazepam  0-4 mg Oral Q6H   Followed by   Melene Muller ON 04/10/2023] LORazepam  0-4 mg Oral Q12H   multivitamin with minerals  1 tablet Oral Daily   pantoprazole  40 mg Oral QPM   potassium chloride  40 mEq Oral Q6H   rosuvastatin  10 mg Oral Daily   tamsulosin  0.4 mg Oral QPC supper   thiamine  100 mg Oral Daily   Or   thiamine  100 mg Intravenous Daily   Continuous Infusions:  sodium chloride     heparin 1,050 Units/hr (04/09/23 0341)   PRN Meds: acetaminophen **OR** acetaminophen, bisacodyl, fentaNYL (SUBLIMAZE) injection, fluticasone, loratadine, LORazepam **OR** LORazepam, oxyCODONE, prochlorperazine, traZODone   Vital Signs    Vitals:   04/09/23 0136 04/09/23 0413 04/09/23 0459 04/09/23 0500  BP: 95/66 (!) 87/62 109/72   Pulse: 76 82 82   Resp:  18 18   Temp:  98.4 F (36.9 C) 98.4 F (36.9 C)   TempSrc:  Oral Oral   SpO2:  100%    Weight:    88 kg  Height:        Intake/Output Summary (Last 24 hours) at 04/09/2023 0748 Last data filed at 04/09/2023 0341 Gross per 24 hour  Intake 2423.89 ml  Output 200 ml  Net 2223.89 ml      04/09/2023    5:00 AM 04/08/2023    7:47 AM 01/31/2023    9:42 AM  Last 3 Weights  Weight (lbs) 194 lb 0.1 oz 195 lb 1.7 oz 195 lb 3.2 oz  Weight (kg) 88 kg 88.5 kg 88.542 kg      Telemetry    NSR, HR in 60's to  70's.  - Personally Reviewed  Physical Exam   GEN: Pleasant male appearing in no acute distress.   Neck: No JVD Cardiac: RRR, no murmurs, rubs, or gallops.  Respiratory: Clear to auscultation bilaterally. GI: Soft, nontender, non-distended  MS: No pitting edema; No deformity. Neuro:  Nonfocal  Psych: Normal affect   Labs    High Sensitivity Troponin:   Recent Labs  Lab 04/08/23 0752 04/08/23 0951  TROPONINIHS 315* 85*     Chemistry Recent Labs  Lab 04/08/23 0752 04/09/23 0406  NA 131* 135  K 2.5* 3.2*  CL 97* 105  CO2 19* 21*  GLUCOSE 103* 94  BUN 18 15  CREATININE 2.28* 1.26*  CALCIUM 8.8* 8.0*  MG 1.2* 1.8  PROT 9.2*  --   ALBUMIN 4.3  --   AST 58*  --   ALT 27  --   ALKPHOS 55  --   BILITOT 1.0  --   GFRNONAA 30* >60  ANIONGAP 15 9    Lipids No results for input(s): "CHOL", "TRIG", "HDL", "LABVLDL", "LDLCALC", "CHOLHDL" in the last 168  hours.  Hematology Recent Labs  Lab 04/08/23 0752 04/09/23 0406  WBC 7.3 5.3  RBC 4.68 3.64*  HGB 14.8 11.6*  HCT 44.6 36.1*  MCV 95.3 99.2  MCH 31.6 31.9  MCHC 33.2 32.1  RDW 14.9 15.0  PLT 246 178   Thyroid No results for input(s): "TSH", "FREET4" in the last 168 hours.  BNP Recent Labs  Lab 04/08/23 0752  BNP 58.0    DDimer No results for input(s): "DDIMER" in the last 168 hours.   Radiology    US RENAL  Result Date: 04/08/2023 CLINICAL DATA:  Acute kidney injury EXAM: RENAL / URINARY TRACT ULTRASOUND COMPLETE COMPARISON:  CT abdomen and pelvis dated 11/07/2022 FINDINGS: Right Kidney: Length = 10.8 cm AP renal pelvis diameter = <10 mm Normal parenchymal echogenicity with preserved corticomedullary differentiation. No urinary tract dilation or shadowing calculi. The ureter is not seen. Left Kidney: Length = 11.9 cm AP renal pelvis diameter = <10 mm Normal parenchymal echogenicity with preserved corticomedullary differentiation. Interpolar cyst measures 1.5 x 1.1 x 1.1 cm. No urinary tract dilation or  shadowing calculi. The ureter is not seen. Bladder: Appears normal for degree of bladder distention. Other: None. IMPRESSION: No urinary tract dilation or shadowing calculi. Electronically Signed   By: Agustin Cree M.D.   On: 04/08/2023 15:36   DG Chest Port 1 View  Result Date: 04/08/2023 CLINICAL DATA:  Shortness of breath and chest pain for 1 week. Anemia. EXAM: PORTABLE CHEST 1 VIEW COMPARISON:  11/07/2022 FINDINGS: The heart size and mediastinal contours are within normal limits. Mild bibasilar scarring again noted. Both lungs are otherwise clear. The visualized skeletal structures are unremarkable. IMPRESSION: No active disease. Electronically Signed   By: Danae Orleans M.D.   On: 04/08/2023 08:17    Cardiac Studies   Echocardiogram: 04/08/2023 IMPRESSIONS     1. Left ventricular ejection fraction, by estimation, is 50 to 55%. The  left ventricle has low normal function. The left ventricle demonstrates  regional wall motion abnormalities (see scoring diagram/findings for  description). There is mild left  ventricular hypertrophy. Left ventricular diastolic parameters are  consistent with Grade I diastolic dysfunction (impaired relaxation).   2. Right ventricular systolic function is normal. The right ventricular  size is normal. Tricuspid regurgitation signal is inadequate for assessing  PA pressure.   3. The mitral valve is normal in structure. No evidence of mitral valve  regurgitation. No evidence of mitral stenosis.   4. The aortic valve is tricuspid. Aortic valve regurgitation is not  visualized. No aortic stenosis is present.   5. The inferior vena cava is normal in size with greater than 50%  respiratory variability, suggesting right atrial pressure of 3 mmHg.   Patient Profile     72 y.o. 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   Assessment & Plan   1. NSTEMI/Abnormal EKG - Presented with worsening dyspnea on exertion over the past week and also reported episodes of chest pain which had been constant for several days. Found to have an NSTEMI with Hs Troponin values at 315 and 85 and his EKG does show diffuse TWI along the inferior and lateral leads. Echocardiogram also shows wall motion abnormalities as described below. - In discussion with Dr. Wyline Mood, will plan for a cardiac catheterization today for definitive evaluation. The patient understands  that risks include but are not limited to stroke (1 in 1000), death (1 in 1000), kidney failure [usually temporary] (1 in 500), bleeding (1 in 200), allergic reaction [possibly serious] (1 in 200). He is in agreement to proceed and added to the schedule for today. Also communicated with the Hospitalist who will assist with transfer. - Continue IV Heparin, ASA 81mg  daily, Coreg 3.125mg  BID and Crestor 10mg  daily (LDL at 30 in 02/2023).    2. Chronic HFmrEF - His ejection fraction was at 45 to 50% by echocardiogram in 01/2022 and felt to be related to alcohol use at that time. Repeat echo this admission shows his EF is 50 to 55% but noted to have wall motion abnormalities with the mid and distal anterior septum, apical lateral segment and apex being hypokinetic.  - He appears euvolemic by examination today and BNP has been normal at 58 this admission. PTA Jardiance and [name] have been held given his AKI and would likely restart following cardiac catheterization. Continue Coreg 3.125mg  BID.    3. Syncope/Prolonged QTc - Unclear etiology as he reports 2 episodes of syncope within the past week and 1 episode occurred while sitting on the side of his bed and another while driving. Did receive 2L IVF on admission. K+ also low at 2.5 and has improved to 3.2 this AM with additional supplementation ordered. Mg also at 1.2 on admission and at 1.8 today. QTc prolonged on admission  with repeat EKG pending for this AM.   4. AKI - Creatinine elevated at 2.28 on admission and was previously 1.03 last month. Improved to 1.26 today with IV fluids. Will order additional fluids with pre-cath orders. PTA [name] and Jardiance currently held.    For questions or updates, please contact [facility] Please consult [email or web address] for contact info under        Signed, Ellsworth Lennox, PA-C  04/09/2023, 7:48 AM    Attending note  Patient seen and disucssed with PA Iran Ouch, I agree with her documentation   1.NSTEMI - peak trop to 315, trending down. 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 Unclear if acute obstructive disease or if perhaps his hypotension led to demand ischemia in setting of chronic obstructive disease.  - echo LVEF 50-55%, anteroseptal/apical hypokinesis - medical therapy with hep gtt, ASA 81, coreg 3.125mg  bid, crestor 10mg  daily. Home entresto on hold given AKI, have also held SGLT2i given AKI and plans for cath   - plan for cath today       2.Hypovolemia/Syncope - prior admissions with hypovolemia/AKI/failure to thrive - presents again with AKI, multiple electrolyte abnormalities. Initially hypotensive, syncope prior to admission - has received IVFs in the ER with improved bp's and renal function   3. AKI - at least 3rd admission presenting hypovolemic with AKI, electrolyte abnormalities - per chart EtOH history, failure to thrive poor oral intake chronically - resolving with IVFs, additional 1L at 171mL/hr today of NS - hemoconentrated on admission, drop in Hgb related to rehydration as opposed to any blood loss, back to his baseline Hgb.    Dina Rich MD

## 2023-04-09 NOTE — TOC Progression Note (Signed)
Transition of Care [facility]) - Progression Note    Patient Details  Name: Jonathan Benitez MRN: [id number] Date of Birth: 01-19-1951  [facility]) CM/SW Contact  Erin Sons, [place] Phone Number: 04/09/2023, 10:11 AM  Clinical Narrative:     [facility]. [phone] Substance use resources added to AVS.   Pt is transferring to [facility] and will need transportation assistance back to his car at [facility].        Social Determinants of Health (SDOH) Interventions SDOH Screenings   Food Insecurity: No Food Insecurity (04/08/2023)  Housing: Low Risk  (04/08/2023)  Transportation Needs: No Transportation Needs (04/08/2023)  Utilities: Not At Risk (04/08/2023)  Depression (PHQ2-9): Low Risk  (01/31/2023)  Financial Resource Strain: Medium Risk (03/26/2021)  Physical Activity: Insufficiently Active (03/26/2021)  Social Connections: Socially Isolated (03/26/2021)  Stress: No Stress Concern Present (03/26/2021)  Tobacco Use: Low Risk  (04/08/2023)    Readmission Risk Interventions    11/08/2022    9:32 AM  Readmission Risk Prevention Plan  Transportation Screening Complete  HRI or Home Care Consult Complete  Social Work Consult for Recovery Care Planning/Counseling Complete  Palliative Care Screening Not Applicable  Medication Review Ocean[employer]) Complete

## 2023-04-09 NOTE — Plan of Care (Signed)
  Problem: Education: Goal: Knowledge of General Education information will improve Description: Including pain rating scale, medication(s)/side effects and non-pharmacologic comfort measures Outcome: Progressing   Problem: Health Behavior/Discharge Planning: Goal: Ability to manage health-related needs will improve Outcome: Progressing   Problem: Clinical Measurements: Goal: Ability to maintain clinical measurements within normal limits will improve Outcome: Progressing Goal: Will remain free from infection Outcome: Progressing Goal: Diagnostic test results will improve Outcome: Progressing   Problem: Pain Management: Goal: General experience of comfort will improve Outcome: Progressing   Problem: Safety: Goal: Ability to remain free from injury will improve Outcome: Progressing   Problem: Education: Goal: Understanding of CV disease, CV risk reduction, and recovery process will improve Outcome: Progressing Goal: Individualized Educational Video(s) Outcome: Progressing   Problem: Activity: Goal: Ability to return to baseline activity level will improve Outcome: Progressing   Problem: Cardiovascular: Goal: Ability to achieve and maintain adequate cardiovascular perfusion will improve Outcome: Progressing Goal: Vascular access site(s) Level 0-1 will be maintained Outcome: Progressing   Problem: Health Behavior/Discharge Planning: Goal: Ability to safely manage health-related needs after discharge will improve Outcome: Progressing

## 2023-04-09 NOTE — Progress Notes (Addendum)
PROGRESS NOTE  Jonathan Benitez:[id number] DOB: [birth year] DOA: 04/08/2023 PCP: [name], MD  Brief History:  72 year old male with hypertension, history of alcohol use (3-4 beers/day), history of hepatitis C and history of prostate cancer s/p radiotherapy, HFmrEF (EF 45-50%), GERD, hyperlipidemia, AVN s/p left hip replacement, elevated LFTs, h/o recreational substance use (marijuana), recurrent bouts of hyponatremia and hypokalemia presented to ED tcomplaining of intermittent chest pain and progressive shortness of breath for at least 1 week.  He also reported he passed out a couple of times in the last week.  He was found to have an abnormal EKG with prolonged QT, ST wave depression and elevated troponin of 315.  His BNP was 58.  Hgb 14.8.  He was started on IV heparin and was seen by cardiology.  His electrolytes were abnormal with magnesium of 1.2 and potassium of 2.5 with a sodium of 131.  His chest xray was unrevealing.  He viral testing was negative for Covid, influenza and other respiratory viruses.  His renal function was noted to be abnormal with a creatinine of 2.28 which is up from 1.03 on 02/25/23.  Cardiology requested admission for IV heparin which was continued.  On 04/09/23, cardiology requested transfer to [facility] for heart cath.   Assessment/Plan: NSTEMI - pt presented with chest pain and progressive dyspnea -Personally reviewed EKG--sinus rhythm, diffuse T wave inversion - initial high sensitivity troponin elevated at 315>>85 - continue IV heparin infusion per cardiology -Continue aspirin, Crestor, and carvedilol -04/09/23>>discussed with cardiology>>transfer to [facility] for heart cath   Prolonged QT  - replace low magnesium and potassium - recheck serum levels in AM   AKI / dehydration  -Secondary to volume depletion - renally dose meds and hold nephrotoxic agents - baseline creatinine 0.9-1.0 - presented with serum creatinine 2.28 - hold Entresto   -04/08/2023 renal ultrasound--negative hydronephrosis   Hyponatremia  -Secondary to dehydration  -Improving with IV fluids   Hypocalcemia  - mild, treating with oral tums -Corrected calcium 8.8 at the time of admission  Hypokalemia/Hypomagnesemia -repleting   HFmrEF -01/19/2022 LVEF 45-50%  -04/08/2023 echo EF 50-25%, grade 1 DD, mild LVH   Hyperlipidemia - resume daily rosuvastatin 10 mg  - 02/25/2023 LDL 30, total cholesterol 601, triglycerides 85   Syncopal episodes - suspect from dehydration as he appears dry and prerenal at time of admission - pt fluid resuscitated - when medically stable obtain PT Eval   Gout  - no s/s of acute gout - resume home allopurinol    Alcohol dependence - CIWA protocol  - supplement thiamine, folic acid, MVI - check UDS--POS THC - no signs of withdrawal   BPH with nocturia - resume home nightly tamsulosin 0.4 mg  - bladder scan PRN and cath PRN if unable to void  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  He shortness of breath has improved.  Denies any nausea, vomiting, diarrhea, hematochezia, melena.  There is no fevers or chills.  Objective: Vitals:   04/09/23 0136 04/09/23 0413 04/09/23 0932  04/09/23 0500  BP: 95/66 (!) 87/62 109/72   Pulse: 76 82 82   Resp:  18 18   Temp:  98.4 F (36.9 C) 98.4 F (36.9 C)   TempSrc:  Oral Oral   SpO2:  100%    Weight:    88 kg  Height:        Intake/Output Summary (Last 24 hours) at 04/09/2023 0825 Last data filed at 04/09/2023 0341 Gross per 24 hour  Intake 2423.89 ml  Output 200 ml  Net  2223.89 ml   Weight change:  Exam:  General:  Pt is alert, follows commands appropriately, not in acute distress HEENT: No icterus, No thrush, No neck mass, [place]/AT Cardiovascular: RRR, S1/S2, no rubs, no gallops Respiratory: CTA bilaterally, no wheezing, no crackles, no rhonchi Abdomen: Soft/+BS, epigastric tender, non distended, no guarding Extremities: No edema, No lymphangitis, No petechiae, No rashes, no synovitis   Data Reviewed: I have personally reviewed following labs and imaging studies Basic Metabolic Panel: Recent Labs  Lab 04/08/23 0752 04/09/23 0406  NA 131* 135  K 2.5* 3.2*  CL 97* 105  CO2 19* 21*  GLUCOSE 103* 94  BUN 18 15  CREATININE 2.28* 1.26*  CALCIUM 8.8* 8.0*  MG 1.2* 1.8   Liver Function Tests: Recent Labs  Lab 04/08/23 0752  AST 58*  ALT 27  ALKPHOS 55  BILITOT 1.0  PROT 9.2*  ALBUMIN 4.3   Recent Labs  Lab 04/08/23 0752  LIPASE 43   No results for input(s): "AMMONIA" in the last 168 hours. Coagulation Profile: No results for input(s): "INR", "PROTIME" in the last 168 hours. CBC: Recent Labs  Lab 04/08/23 0752 04/09/23 0406  WBC 7.3 5.3  NEUTROABS 4.1  --   HGB 14.8 11.6*  HCT 44.6 36.1*  MCV 95.3 99.2  PLT 246 178   Cardiac Enzymes: No results for input(s): "CKTOTAL", "CKMB", "CKMBINDEX", "TROPONINI" in the last 168 hours. BNP: Invalid input(s): "POCBNP" CBG: No results for input(s): "GLUCAP" in the last 168 hours. HbA1C: No results for input(s): "HGBA1C" in the last 72 hours. Urine analysis:    Component Value Date/Time   COLORURINE AMBER (A) 04/09/2023 0402   APPEARANCEUR HAZY (A) 04/09/2023 0402   LABSPEC 1.016 04/09/2023 0402   PHURINE 5.0 04/09/2023 0402   GLUCOSEU >=500 (A) 04/09/2023 0402   HGBUR NEGATIVE 04/09/2023 0402   BILIRUBINUR NEGATIVE 04/09/2023 0402   KETONESUR 5 (A) 04/09/2023 0402   PROTEINUR NEGATIVE 04/09/2023 0402   UROBILINOGEN 0.2 12/17/2013 0825   NITRITE NEGATIVE 04/09/2023 0402    LEUKOCYTESUR NEGATIVE 04/09/2023 0402   Sepsis Labs: @LABRCNTIP (procalcitonin:4,lacticidven:4) ) Recent Results (from the past 240 hour(s))  Resp panel by RT-PCR (RSV, Flu A&B, Covid) Anterior Nasal Swab     Status: None   Collection Time: 04/08/23  8:08 AM   Specimen: Anterior Nasal Swab  Result Value Ref Range Status   SARS Coronavirus 2 by RT PCR NEGATIVE NEGATIVE Final    Comment: (NOTE) SARS-CoV-2 target nucleic acids are NOT DETECTED.  The SARS-CoV-2 RNA is generally detectable in upper respiratory specimens during the acute phase of infection. The lowest concentration of SARS-CoV-2 viral copies this assay can detect is 138 copies/mL. A negative result does not preclude SARS-Cov-2 infection and should not be used as the sole basis for treatment or other patient management decisions. A negative result may occur with  improper specimen collection/handling, submission of specimen other than nasopharyngeal swab, presence of viral mutation(s) within the areas targeted by this assay,  and inadequate number of viral copies(<138 copies/mL). A negative result must be combined with clinical observations, patient history, and epidemiological information. The expected result is Negative.  Fact Sheet for Patients:  BloggerCourse.com  Fact Sheet for Healthcare Providers:  SeriousBroker.it  This test is no t yet approved or cleared by the [place] FDA and  has been authorized for detection and/or diagnosis of SARS-CoV-2 by FDA under an Emergency Use Authorization (EUA). This EUA will remain  in effect (meaning this test can be used) for the duration of the COVID-19 declaration under Section 564(b)(1) of the Act, 21 U.S.C.section 360bbb-3(b)(1), unless the authorization is terminated  or revoked sooner.       Influenza A by PCR NEGATIVE NEGATIVE Final   Influenza B by PCR NEGATIVE NEGATIVE Final    Comment: (NOTE) The Xpert  Xpress SARS-CoV-2/FLU/RSV plus assay is intended as an aid in the diagnosis of influenza from Nasopharyngeal swab specimens and should not be used as a sole basis for treatment. Nasal washings and aspirates are unacceptable for Xpert Xpress SARS-CoV-2/FLU/RSV testing.  Fact Sheet for Patients: BloggerCourse.com  Fact Sheet for Healthcare Providers: SeriousBroker.it  This test is not yet approved or cleared by the [place] FDA and has been authorized for detection and/or diagnosis of SARS-CoV-2 by FDA under an Emergency Use Authorization (EUA). This EUA will remain in effect (meaning this test can be used) for the duration of the COVID-19 declaration under Section 564(b)(1) of the Act, 21 U.S.C. section 360bbb-3(b)(1), unless the authorization is terminated or revoked.     Resp Syncytial Virus by PCR NEGATIVE NEGATIVE Final    Comment: (NOTE) Fact Sheet for Patients: BloggerCourse.com  Fact Sheet for Healthcare Providers: SeriousBroker.it  This test is not yet approved or cleared by the [place] FDA and has been authorized for detection and/or diagnosis of SARS-CoV-2 by FDA under an Emergency Use Authorization (EUA). This EUA will remain in effect (meaning this test can be used) for the duration of the COVID-19 declaration under Section 564(b)(1) of the Act, 21 U.S.C. section 360bbb-3(b)(1), unless the authorization is terminated or revoked.  Performed at [facility], [address] [place], [place] [address]      Scheduled Meds:  allopurinol  100 mg Oral Daily   aspirin EC  81 mg Oral Daily   calcium carbonate  1 tablet Oral BID WC   carvedilol  3.125 mg Oral BID WC   folic acid  1 mg Oral Daily   influenza vaccine adjuvanted  0.5 mL Intramuscular Tomorrow-1000   LORazepam  0-4 mg Oral Q6H   Followed by   Melene Muller ON 04/10/2023] LORazepam  0-4 mg Oral Q12H    multivitamin with minerals  1 tablet Oral Daily   pantoprazole  40 mg Oral QPM   potassium chloride  40 mEq Oral Q6H   rosuvastatin  10 mg Oral Daily   tamsulosin  0.4 mg Oral QPC supper   thiamine  100 mg Oral Daily   Or   thiamine  100 mg Intravenous Daily   Continuous Infusions:  sodium chloride     heparin 1,050 Units/hr (04/09/23 0341)    Procedures/Studies: ECHOCARDIOGRAM COMPLETE  Result Date: 04/08/2023    ECHOCARDIOGRAM REPORT   Patient Name:   LEELYNN OTERO Date of Exam: 04/08/2023 Medical Rec #:  [id number]        Height:       72.0 in Accession #:    [id number]       Weight:  195.1 lb Date of Birth:  1950/10/30        BSA:          2.108 m Patient Age:    72 years         BP:           110/81 mmHg Patient Gender: M                HR:           79` bpm. [facility] Procedure: 2D Echo, Cardiac Doppler and Color Doppler Indications:    Chest Pain R07.9  History:        Patient has prior history of Echocardiogram examinations, most                 recent 01/20/2022. Cardiomyopathy; Risk Factors:Dyslipidemia and                 Hypertension. Elevated troponin, History of alcohol abuse,                 NSTEMI (non-ST elevated myocardial infarction), Marijuana abuse.  Sonographer:    Celesta Gentile RCS Referring Phys: [id number] Dorothe Pea BRANCH IMPRESSIONS  1. Left ventricular ejection fraction, by estimation, is 50 to 55%. The left ventricle has low normal function. The left ventricle demonstrates regional wall motion abnormalities (see scoring diagram/findings for description). There is mild left ventricular hypertrophy. Left ventricular diastolic parameters are consistent with Grade I diastolic dysfunction (impaired relaxation).  2. Right ventricular systolic function is normal. The right ventricular size is normal. Tricuspid regurgitation signal is inadequate for assessing PA pressure.  3. The mitral valve is normal in structure. No evidence of mitral valve  regurgitation. No evidence of mitral stenosis.  4. The aortic valve is tricuspid. Aortic valve regurgitation is not visualized. No aortic stenosis is present.  5. The inferior vena cava is normal in size with greater than 50% respiratory variability, suggesting right atrial pressure of 3 mmHg. FINDINGS  Left Ventricle: Left ventricular ejection fraction, by estimation, is 50 to 55%. The left ventricle has low normal function. The left ventricle demonstrates regional wall motion abnormalities. The left ventricular internal cavity size was normal in size. There is mild left ventricular hypertrophy. Left ventricular diastolic parameters are consistent with Grade I diastolic dysfunction (impaired relaxation). Normal left ventricular filling pressure.  LV Wall Scoring: The mid and distal anterior septum, apical lateral segment, and apex are hypokinetic. Right Ventricle: The right ventricular size is normal. Right vetricular wall thickness was not well visualized. Right ventricular systolic function is normal. Tricuspid regurgitation signal is inadequate for assessing PA pressure. Left Atrium: Left atrial size was normal in size. Right Atrium: Right atrial size was normal in size. Pericardium: There is no evidence of pericardial effusion. Mitral Valve: The mitral valve is normal in structure. No evidence of mitral valve regurgitation. No evidence of mitral valve stenosis. Tricuspid Valve: The tricuspid valve is normal in structure. Tricuspid valve regurgitation is trivial. No evidence of tricuspid stenosis. Aortic Valve: The aortic valve is tricuspid. Aortic valve regurgitation is not visualized. No aortic stenosis is present. Aortic valve mean gradient measures 2.5 mmHg. Aortic valve peak gradient measures 5.4 mmHg. Aortic valve area, by VTI measures 2.66 cm. Pulmonic Valve: The pulmonic valve was not well visualized. Pulmonic valve regurgitation is not visualized. No evidence of pulmonic stenosis. Aorta: The aortic  root is normal in size and structure. Venous: The inferior vena cava is normal in size with greater  than 50% respiratory variability, suggesting right atrial pressure of 3 mmHg. IAS/Shunts: No atrial level shunt detected by color flow Doppler.  LEFT VENTRICLE PLAX 2D LVIDd:         4.00 cm     Diastology LVIDs:         2.20 cm     LV e' medial:    3.48 cm/s LV PW:         1.00 cm     LV E/e' medial:  14.0 LV IVS:        1.20 cm     LV e' lateral:   6.64 cm/s LVOT diam:     2.00 cm     LV E/e' lateral: 7.3 LV SV:         51 LV SV Index:   24 LVOT Area:     3.14 cm  LV Volumes (MOD) LV vol d, MOD A2C: 50.5 ml LV vol d, MOD A4C: 94.1 ml LV vol s, MOD A2C: 27.6 ml LV vol s, MOD A4C: 41.8 ml LV SV MOD A2C:     22.9 ml LV SV MOD A4C:     94.1 ml LV SV MOD BP:      35.2 ml RIGHT VENTRICLE RV S prime:     12.10 cm/s TAPSE (M-mode): 2.0 cm LEFT ATRIUM             Index        RIGHT ATRIUM           Index LA diam:        3.80 cm 1.80 cm/m   RA Area:     11.30 cm LA Vol (A2C):   56.2 ml 26.66 ml/m  RA Volume:   25.00 ml  11.86 ml/m LA Vol (A4C):   50.1 ml 23.76 ml/m LA Biplane Vol: 52.9 ml 25.09 ml/m  AORTIC VALVE AV Area (Vmax):    2.31 cm AV Area (Vmean):   2.51 cm AV Area (VTI):     2.66 cm AV Vmax:           116.22 cm/s AV Vmean:          74.055 cm/s AV VTI:            0.190 m AV Peak Grad:      5.4 mmHg AV Mean Grad:      2.5 mmHg LVOT Vmax:         85.50 cm/s LVOT Vmean:        59.100 cm/s LVOT VTI:          0.161 m LVOT/AV VTI ratio: 0.85  AORTA Ao Root diam: 3.50 cm MITRAL VALVE MV Area (PHT): 2.05 cm     SHUNTS MV Decel Time: 370 msec     Systemic VTI:  0.16 m MV E velocity: 48.80 cm/s   Systemic Diam: 2.00 cm MV A velocity: 105.00 cm/s MV E/A ratio:  0.46 Dina Rich MD Electronically signed by Dina Rich MD Signature Date/Time: 04/08/2023/4:19:40 PM    Final    US RENAL  Result Date: 04/08/2023 CLINICAL DATA:  Acute kidney injury EXAM: RENAL / URINARY TRACT ULTRASOUND COMPLETE COMPARISON:   CT abdomen and pelvis dated 11/07/2022 FINDINGS: Right Kidney: Length = 10.8 cm AP renal pelvis diameter = <10 mm Normal parenchymal echogenicity with preserved corticomedullary differentiation. No urinary tract dilation or shadowing calculi. The ureter is not seen. Left Kidney: Length = 11.9 cm AP renal pelvis diameter = <10 mm Normal parenchymal echogenicity  with preserved corticomedullary differentiation. Interpolar cyst measures 1.5 x 1.1 x 1.1 cm. No urinary tract dilation or shadowing calculi. The ureter is not seen. Bladder: Appears normal for degree of bladder distention. Other: None. IMPRESSION: No urinary tract dilation or shadowing calculi. Electronically Signed   By: Agustin Cree M.D.   On: 04/08/2023 15:36   DG Chest Port 1 View  Result Date: 04/08/2023 CLINICAL DATA:  Shortness of breath and chest pain for 1 week. Anemia. EXAM: PORTABLE CHEST 1 VIEW COMPARISON:  11/07/2022 FINDINGS: The heart size and mediastinal contours are within normal limits. Mild bibasilar scarring again noted. Both lungs are otherwise clear. The visualized skeletal structures are unremarkable. IMPRESSION: No active disease. Electronically Signed   By: Danae Orleans M.D.   On: 04/08/2023 08:17    Catarina Hartshorn, DO  [facility]  If 7PM-7AM, please contact night-coverage [email or web address] Password [email or web address] 04/09/2023, 8:25 AM   LOS: 1 day

## 2023-04-09 NOTE — Progress Notes (Signed)
PHARMACY - ANTICOAGULATION CONSULT NOTE  Pharmacy Consult for heparin Indication: chest pain/ACS  Labs: Recent Labs    04/08/23 0752 04/08/23 0951 04/08/23 1841 04/09/23 0406  HGB 14.8  --   --  11.6*  HCT 44.6  --   --  36.1*  PLT 246  --   --  178  HEPARINUNFRC  --   --  0.90* 0.68  CREATININE 2.28*  --   --   --   TROPONINIHS 315* 85*  --   --    Assessment/Plan:  72yo male therapeutic on heparin after rate change. Will continue infusion at current rate of 1050 units/hr and confirm stable with additional level.  Vernard Gambles, PharmD, BCPS 04/09/2023 6:08 AM

## 2023-04-09 NOTE — H&P (View-Only) (Signed)
Rounding Note    Patient Name: Jonathan Benitez Date of Encounter: 04/09/2023  [facility] Cardiologist: Nona Dell, MD   Subjective   Reports some episodes of sharp chest pain overnight but would spontaneously resolve within 1 minute. Breathing improved. Has been NPO since midnight for possible cardiac cath today.   Inpatient Medications    Scheduled Meds:  allopurinol  100 mg Oral Daily   aspirin EC  81 mg Oral Daily   calcium carbonate  1 tablet Oral BID WC   carvedilol  3.125 mg Oral BID WC   folic acid  1 mg Oral Daily   influenza vaccine adjuvanted  0.5 mL Intramuscular Tomorrow-1000   LORazepam  0-4 mg Oral Q6H   Followed by   Melene Muller ON 04/10/2023] LORazepam  0-4 mg Oral Q12H   multivitamin with minerals  1 tablet Oral Daily   pantoprazole  40 mg Oral QPM   potassium chloride  40 mEq Oral Q6H   rosuvastatin  10 mg Oral Daily   tamsulosin  0.4 mg Oral QPC supper   thiamine  100 mg Oral Daily   Or   thiamine  100 mg Intravenous Daily   Continuous Infusions:  sodium chloride     heparin 1,050 Units/hr (04/09/23 0341)   PRN Meds: acetaminophen **OR** acetaminophen, bisacodyl, fentaNYL (SUBLIMAZE) injection, fluticasone, loratadine, LORazepam **OR** LORazepam, oxyCODONE, prochlorperazine, traZODone   Vital Signs    Vitals:   04/09/23 0136 04/09/23 0413 04/09/23 0459 04/09/23 0500  BP: 95/66 (!) 87/62 109/72   Pulse: 76 82 82   Resp:  18 18   Temp:  98.4 F (36.9 C) 98.4 F (36.9 C)   TempSrc:  Oral Oral   SpO2:  100%    Weight:    88 kg  Height:        Intake/Output Summary (Last 24 hours) at 04/09/2023 0748 Last data filed at 04/09/2023 0341 Gross per 24 hour  Intake 2423.89 ml  Output 200 ml  Net 2223.89 ml      04/09/2023    5:00 AM 04/08/2023    7:47 AM 01/31/2023    9:42 AM  Last 3 Weights  Weight (lbs) 194 lb 0.1 oz 195 lb 1.7 oz 195 lb 3.2 oz  Weight (kg) 88 kg 88.5 kg 88.542 kg      Telemetry    NSR, HR in 60's to  70's.  - Personally Reviewed  Physical Exam   GEN: Pleasant male appearing in no acute distress.   Neck: No JVD Cardiac: RRR, no murmurs, rubs, or gallops.  Respiratory: Clear to auscultation bilaterally. GI: Soft, nontender, non-distended  MS: No pitting edema; No deformity. Neuro:  Nonfocal  Psych: Normal affect   Labs    High Sensitivity Troponin:   Recent Labs  Lab 04/08/23 0752 04/08/23 0951  TROPONINIHS 315* 85*     Chemistry Recent Labs  Lab 04/08/23 0752 04/09/23 0406  NA 131* 135  K 2.5* 3.2*  CL 97* 105  CO2 19* 21*  GLUCOSE 103* 94  BUN 18 15  CREATININE 2.28* 1.26*  CALCIUM 8.8* 8.0*  MG 1.2* 1.8  PROT 9.2*  --   ALBUMIN 4.3  --   AST 58*  --   ALT 27  --   ALKPHOS 55  --   BILITOT 1.0  --   GFRNONAA 30* >60  ANIONGAP 15 9    Lipids No results for input(s): "CHOL", "TRIG", "HDL", "LABVLDL", "LDLCALC", "CHOLHDL" in the last 168  hours.  Hematology Recent Labs  Lab 04/08/23 0752 04/09/23 0406  WBC 7.3 5.3  RBC 4.68 3.64*  HGB 14.8 11.6*  HCT 44.6 36.1*  MCV 95.3 99.2  MCH 31.6 31.9  MCHC 33.2 32.1  RDW 14.9 15.0  PLT 246 178   Thyroid No results for input(s): "TSH", "FREET4" in the last 168 hours.  BNP Recent Labs  Lab 04/08/23 0752  BNP 58.0    DDimer No results for input(s): "DDIMER" in the last 168 hours.   Radiology    US RENAL  Result Date: 04/08/2023 CLINICAL DATA:  Acute kidney injury EXAM: RENAL / URINARY TRACT ULTRASOUND COMPLETE COMPARISON:  CT abdomen and pelvis dated 11/07/2022 FINDINGS: Right Kidney: Length = 10.8 cm AP renal pelvis diameter = <10 mm Normal parenchymal echogenicity with preserved corticomedullary differentiation. No urinary tract dilation or shadowing calculi. The ureter is not seen. Left Kidney: Length = 11.9 cm AP renal pelvis diameter = <10 mm Normal parenchymal echogenicity with preserved corticomedullary differentiation. Interpolar cyst measures 1.5 x 1.1 x 1.1 cm. No urinary tract dilation or  shadowing calculi. The ureter is not seen. Bladder: Appears normal for degree of bladder distention. Other: None. IMPRESSION: No urinary tract dilation or shadowing calculi. Electronically Signed   By: Agustin Cree M.D.   On: 04/08/2023 15:36   DG Chest Port 1 View  Result Date: 04/08/2023 CLINICAL DATA:  Shortness of breath and chest pain for 1 week. Anemia. EXAM: PORTABLE CHEST 1 VIEW COMPARISON:  11/07/2022 FINDINGS: The heart size and mediastinal contours are within normal limits. Mild bibasilar scarring again noted. Both lungs are otherwise clear. The visualized skeletal structures are unremarkable. IMPRESSION: No active disease. Electronically Signed   By: Danae Orleans M.D.   On: 04/08/2023 08:17    Cardiac Studies   Echocardiogram: 04/08/2023 IMPRESSIONS     1. Left ventricular ejection fraction, by estimation, is 50 to 55%. The  left ventricle has low normal function. The left ventricle demonstrates  regional wall motion abnormalities (see scoring diagram/findings for  description). There is mild left  ventricular hypertrophy. Left ventricular diastolic parameters are  consistent with Grade I diastolic dysfunction (impaired relaxation).   2. Right ventricular systolic function is normal. The right ventricular  size is normal. Tricuspid regurgitation signal is inadequate for assessing  PA pressure.   3. The mitral valve is normal in structure. No evidence of mitral valve  regurgitation. No evidence of mitral stenosis.   4. The aortic valve is tricuspid. Aortic valve regurgitation is not  visualized. No aortic stenosis is present.   5. The inferior vena cava is normal in size with greater than 50%  respiratory variability, suggesting right atrial pressure of 3 mmHg.   Patient Profile     72 y.o. 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   Assessment & Plan   1. NSTEMI/Abnormal EKG - Presented with worsening dyspnea on exertion over the past week and also reported episodes of chest pain which had been constant for several days. Found to have an NSTEMI with Hs Troponin values at 315 and 85 and his EKG does show diffuse TWI along the inferior and lateral leads. Echocardiogram also shows wall motion abnormalities as described below. - In discussion with Dr. Wyline Mood, will plan for a cardiac catheterization today for definitive evaluation. The patient understands  that risks include but are not limited to stroke (1 in 1000), death (1 in 1000), kidney failure [usually temporary] (1 in 500), bleeding (1 in 200), allergic reaction [possibly serious] (1 in 200). He is in agreement to proceed and added to the schedule for today. Also communicated with the Hospitalist who will assist with transfer. - Continue IV Heparin, ASA 81mg  daily, Coreg 3.125mg  BID and Crestor 10mg  daily (LDL at 30 in 02/2023).    2. Chronic HFmrEF - His ejection fraction was at 45 to 50% by echocardiogram in 01/2022 and felt to be related to alcohol use at that time. Repeat echo this admission shows his EF is 50 to 55% but noted to have wall motion abnormalities with the mid and distal anterior septum, apical lateral segment and apex being hypokinetic.  - He appears euvolemic by examination today and BNP has been normal at 58 this admission. PTA Jardiance and [name] have been held given his AKI and would likely restart following cardiac catheterization. Continue Coreg 3.125mg  BID.    3. Syncope/Prolonged QTc - Unclear etiology as he reports 2 episodes of syncope within the past week and 1 episode occurred while sitting on the side of his bed and another while driving. Did receive 2L IVF on admission. K+ also low at 2.5 and has improved to 3.2 this AM with additional supplementation ordered. Mg also at 1.2 on admission and at 1.8 today. QTc prolonged on admission  with repeat EKG pending for this AM.   4. AKI - Creatinine elevated at 2.28 on admission and was previously 1.03 last month. Improved to 1.26 today with IV fluids. Will order additional fluids with pre-cath orders. PTA [name] and Jardiance currently held.    For questions or updates, please contact [facility] Please consult [email or web address] for contact info under        Signed, Ellsworth Lennox, PA-C  04/09/2023, 7:48 AM    Attending note  Patient seen and disucssed with PA Iran Ouch, I agree with her documentation   1.NSTEMI - peak trop to 315, trending down. 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 Unclear if acute obstructive disease or if perhaps his hypotension led to demand ischemia in setting of chronic obstructive disease.  - echo LVEF 50-55%, anteroseptal/apical hypokinesis - medical therapy with hep gtt, ASA 81, coreg 3.125mg  bid, crestor 10mg  daily. Home entresto on hold given AKI, have also held SGLT2i given AKI and plans for cath   - plan for cath today       2.Hypovolemia/Syncope - prior admissions with hypovolemia/AKI/failure to thrive - presents again with AKI, multiple electrolyte abnormalities. Initially hypotensive, syncope prior to admission - has received IVFs in the ER with improved bp's and renal function   3. AKI - at least 3rd admission presenting hypovolemic with AKI, electrolyte abnormalities - per chart EtOH history, failure to thrive poor oral intake chronically - resolving with IVFs, additional 1L at 161mL/hr today of NS - hemoconentrated on admission, drop in Hgb related to rehydration as opposed to any blood loss, back to his baseline Hgb.    Dina Rich MD

## 2023-04-09 NOTE — Progress Notes (Signed)
Spoke with Nate, RN and provided report. [facility] in route to transfer patient

## 2023-04-09 NOTE — Plan of Care (Signed)
Performed CIWA per protocol. Pt maintained a score of '0'. Pt was able to provide a urine sample. Currently on a continuous Heparin drip. Pt was NPO at midnight for a possible cardiac catheterization. No complaints of pain was voiced by pt during night shift.

## 2023-04-10 DIAGNOSIS — N179 Acute kidney failure, unspecified: Secondary | ICD-10-CM | POA: Diagnosis not present

## 2023-04-10 DIAGNOSIS — E876 Hypokalemia: Secondary | ICD-10-CM | POA: Diagnosis not present

## 2023-04-10 DIAGNOSIS — I214 Non-ST elevation (NSTEMI) myocardial infarction: Secondary | ICD-10-CM | POA: Diagnosis not present

## 2023-04-10 LAB — BASIC METABOLIC PANEL
Anion gap: 7 (ref 5–15)
BUN: 8 mg/dL (ref 8–23)
CO2: 19 mmol/L — ABNORMAL LOW (ref 22–32)
Calcium: 8 mg/dL — ABNORMAL LOW (ref 8.9–10.3)
Chloride: 108 mmol/L (ref 98–111)
Creatinine, Ser: 1.05 mg/dL (ref 0.61–1.24)
GFR, Estimated: >60 mL/min (ref >60)
Glucose, Bld: 98 mg/dL (ref 70–99)
Potassium: 3.3 mmol/L — ABNORMAL LOW (ref 3.5–5.1)
Sodium: 134 mmol/L — ABNORMAL LOW (ref 135–145)

## 2023-04-10 LAB — CBC
HCT: 33.4 % — ABNORMAL LOW (ref 39.0–52.0)
Hemoglobin: 11 g/dL — ABNORMAL LOW (ref 13.0–17.0)
MCH: 31.9 pg (ref 26.0–34.0)
MCHC: 32.9 g/dL (ref 30.0–36.0)
MCV: 96.8 fL (ref 80.0–100.0)
Platelets: 165 10*3/uL (ref 150–400)
RBC: 3.45 MIL/uL — ABNORMAL LOW (ref 4.22–5.81)
RDW: 14.7 % (ref 11.5–15.5)
WBC: 5.1 10*3/uL (ref 4.0–10.5)
nRBC: 0 % (ref 0.0–0.2)

## 2023-04-10 LAB — MAGNESIUM: Magnesium: 1.6 mg/dL — ABNORMAL LOW (ref 1.7–2.4)

## 2023-04-10 MED ORDER — LACTATED RINGERS IV SOLN: INTRAVENOUS | Status: DC

## 2023-04-10 MED ORDER — POTASSIUM CHLORIDE CRYS ER 20 MEQ PO TBCR
40.0000 meq | EXTENDED_RELEASE_TABLET | Freq: Four times a day (QID) | ORAL | Status: AC
  Administered 2023-04-10 – 2023-04-11 (×4): 40 meq via ORAL
  Filled 2023-04-10 (×4): qty 2

## 2023-04-10 NOTE — Progress Notes (Signed)
Progress Note   Patient: Jonathan Benitez ZOX:[id number] DOB: August 11, 1950 DOA: 04/08/2023     2 DOS: the patient was seen and examined on 04/10/2023   Brief hospital course: 72 year old male with hypertension, history of alcohol use (3-4 beers/day), history of hepatitis C and history of prostate cancer s/p radiotherapy, HFmrEF (EF 45-50%), GERD, hyperlipidemia, AVN s/p left hip replacement, elevated LFTs, h/o recreational substance use (marijuana), recurrent bouts of hyponatremia and hypokalemia presented to ED tcomplaining of intermittent chest pain and progressive shortness of breath for at least 1 week.  He also reported he passed out a couple of times in the last week.  He was found to have an abnormal EKG with prolonged QT, ST wave depression and elevated troponin of 315.  His BNP was 58.  Hgb 14.8.  He was started on IV heparin and was seen by cardiology.  His electrolytes were abnormal with magnesium of 1.2 and potassium of 2.5 with a sodium of 131.  His chest xray was unrevealing.  He viral testing was negative for Covid, influenza and other respiratory viruses.  His renal function was noted to be abnormal with a creatinine of 2.28 which is up from 1.03 on 02/25/23.  Cardiology requested admission for IV heparin which was continued.  On 04/09/23, cardiology requested transfer to [facility] for heart cath.  Assessment and Plan: NSTEMI ruled out, Nonobstructive coronary disease ruled in - pt presented with chest pain and progressive dyspnea -Personally reviewed EKG--sinus rhythm, diffuse T wave inversion -Cardiology was consulted and pt underwent heart cath 11/27, found to have nonobstructive CAD, recs for medical management of CAD -Per Cardiology, etiology for T wave inversions remain unclear. Discussed with Cardiology who recommended head CT w/wo contrast to eval for CNS pathology -Recs to d/c asa, cont coreg   Prolonged QT  - continue to replace lytes as needed   AKI / dehydration  -Secondary to  volume depletion - renally dose meds and hold nephrotoxic agents - baseline creatinine 0.9-1.0 - presented with serum creatinine 2.28 - held Entresto  -04/08/2023 renal ultrasound--negative hydronephrosis -Renal function improved   Hyponatremia  -Secondary to dehydration  -Improved   Hypocalcemia  - replace as needed   Hypokalemia/Hypomagnesemia -repleting   HFmrEF -01/19/2022 LVEF 45-50%  -04/08/2023 echo EF 50-25%, grade 1 DD, mild LVH   Hyperlipidemia - resume daily rosuvastatin 10 mg  - 02/25/2023 LDL 30, total cholesterol 811, triglycerides 85   Syncopal episodes - suspect from dehydration as he appears dry and prerenal at time of admission - Per Cardiology recommendation to restrict pt from driving for at least 3 months -Orthostatic vitals pending   Gout  - no s/s of acute gout - resume home allopurinol    Alcohol dependence - CIWA protocol  - supplement thiamine, folic acid, MVI - check UDS--POS THC - no signs of withdrawal   BPH with nocturia - resume home nightly tamsulosin 0.4 mg  - bladder scan PRN and cath PRN if unable to void   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, 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 SMA -no post prandial pain -outpt follow up  GERD -continue PPI and TUMS as needed   Subjective: complained of brief chest pains last night, believes it to be related to his known reflux disease  Physical Exam: Vitals:   04/10/23 0329 04/10/23 0900 04/10/23 1218 04/10/23 1614  BP: 97/80 122/79 (!) 126/54 139/84  Pulse: 88     Resp: 18 18 18 18   Temp: 97.8 F (36.6 C)  98.8 F (37.1 C) 98 F (36.7 C) 98.3 F (36.8 C)  TempSrc: Oral Oral Oral Oral  SpO2: 99%     Weight: 84.6 kg     Height:       General exam: Awake, laying in bed, in nad Respiratory system: Normal respiratory effort, no wheezing Cardiovascular system: regular rate, s1, s2 Gastrointestinal system: Soft, nondistended, positive BS Central nervous system: CN2-12  grossly intact, strength intact Extremities: Perfused, no clubbing Skin: Normal skin turgor, no notable skin lesions seen Psychiatry: Mood normal // no visual hallucinations   Data Reviewed:  Labs reviewed: Na 134, K 3.3, Cr 1.05, WBC 5.1, Hgb 11.0, Plts 165  Family Communication: Pt in room, family not at bedside  Disposition: Status is: Inpatient Remains inpatient appropriate because: severity of illness  Planned Discharge Destination: Home    Author: Rickey Barbara, MD 04/10/2023 4:29 PM  For on call review [email or web address].

## 2023-04-10 NOTE — Progress Notes (Signed)
Rounding Note    Patient Name: Jonathan Benitez Date of Encounter: 04/10/2023  [facility] Cardiologist: Nona Dell, MD   Patient Profile     72 y.o. 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  No vascular disease.  Was hypotensive in the emergency room Pk troponin 315 diffuse T wave inversions anteriorly  His syncopal episodes are curious, 1 occurred while seated in the car the other while standing up.  He has a history of recurrent and significantly symptomatic orthostatic lightheadedness frequently causing him to sit down.  No other episodes of syncope that he recalls that occurred while seated.  Significant headaches  Subjective   Recurrent  sharp chest pain overnight but would spontaneously resolve within 1 minute. Breathing improved.   LHC >> nonobstructive CAD 11/24  Inpatient Medications    Scheduled Meds:  allopurinol  100 mg Oral Daily   aspirin EC  81 mg Oral Daily   calcium carbonate  1 tablet Oral BID WC   carvedilol  3.125 mg Oral BID WC   folic acid  1 mg Oral Daily   heparin  5,000 Units Subcutaneous Q8H   influenza vaccine adjuvanted  0.5 mL Intramuscular Tomorrow-1000   LORazepam  0-4 mg Oral Q6H   Followed by   LORazepam  0-4 mg Oral Q12H   multivitamin with minerals  1 tablet Oral Daily   pantoprazole  40 mg Oral QPM   rosuvastatin  10 mg Oral Daily   sodium chloride flush  3 mL Intravenous Q12H   tamsulosin  0.4 mg Oral QPC supper   thiamine  100 mg Oral Daily   Or   thiamine  100 mg Intravenous Daily   Continuous Infusions:  sodium chloride 100 mL/hr at 04/09/23 0935   sodium chloride     PRN Meds: sodium chloride, acetaminophen **OR** acetaminophen, bisacodyl, fentaNYL (SUBLIMAZE) injection, fluticasone,  loratadine, LORazepam **OR** LORazepam, oxyCODONE, prochlorperazine, sodium chloride flush, traZODone   Vital Signs    Vitals:   04/09/23 1635 04/09/23 1952 04/10/23 0329 04/10/23 0900  BP: 118/82 92/62 97/80  122/79  Pulse:  84 88   Resp: 18 16 18 18   Temp: 97.7 F (36.5 C) 98 F (36.7 C) 97.8 F (36.6 C) 98.8 F (37.1 C)  TempSrc: Oral Oral Oral Oral  SpO2:  98% 99%   Weight:   84.6 kg   Height:        Intake/Output Summary (Last 24 hours) at 04/10/2023 1027 Last data filed at 04/10/2023 0330 Gross per 24 hour  Intake 525.47 ml  Output 350 ml  Net 175.47 ml      04/10/2023    3:29 AM 04/09/2023    5:00 AM 04/08/2023    7:47 AM  Last 3 Weights  Weight (lbs) 186 lb 8 oz 194 lb 0.1 oz 195 lb 1.7 oz  Weight (kg) 84.596 kg 88 kg 88.5 kg      Telemetry    NSR, HR in 60's to 70's.  - Personally Reviewed T waves remained inverted.  There are some places where the P waves seem to be changing in vector but the QRS was then predominantly negative as opposed to positive making me wonder whether there was a lead position change.  Physical Exam   Well  developed and nourished in no acute distress HENT normal Neck supple   Regular rate and rhythm, no murmurs or gallops Abd-soft with active BS No Clubbing cyanosis edema Skin-warm and dry A & Oriented  Grossly normal sensory and motor function    Labs    High Sensitivity Troponin:   Recent Labs  Lab 04/08/23 0752 04/08/23 0951  TROPONINIHS 315* 85*     Chemistry Recent Labs  Lab 04/08/23 0752 04/09/23 0406 04/09/23 1549 04/10/23 0326  NA 131* 135  --  134*  K 2.5* 3.2*  --  3.3*  CL 97* 105  --  108  CO2 19* 21*  --  19*  GLUCOSE 103* 94  --  98  BUN 18 15  --  8  CREATININE 2.28* 1.26* 1.13 1.05  CALCIUM 8.8* 8.0*  --  8.0*  MG 1.2* 1.8  --  1.6*  PROT 9.2*  --   --   --   ALBUMIN 4.3  --   --   --   AST 58*  --   --   --   ALT 27  --   --   --   ALKPHOS 55  --   --   --   BILITOT 1.0  --   --   --    GFRNONAA 30* >60 >60 >60  ANIONGAP 15 9  --  7    Lipids No results for input(s): "CHOL", "TRIG", "HDL", "LABVLDL", "LDLCALC", "CHOLHDL" in the last 168 hours.  Hematology Recent Labs  Lab 04/09/23 0406 04/09/23 1549 04/10/23 0326  WBC 5.3 6.4 5.1  RBC 3.64* 3.67* 3.45*  HGB 11.6* 11.8* 11.0*  HCT 36.1* 36.5* 33.4*  MCV 99.2 99.5 96.8  MCH 31.9 32.2 31.9  MCHC 32.1 32.3 32.9  RDW 15.0 15.0 14.7  PLT 178 182 165   Thyroid No results for input(s): "TSH", "FREET4" in the last 168 hours.  BNP Recent Labs  Lab 04/08/23 0752  BNP 58.0    DDimer No results for input(s): "DDIMER" in the last 168 hours.   Radiology    US RENAL  Result Date: 04/08/2023 CLINICAL DATA:  Acute kidney injury EXAM: RENAL / URINARY TRACT ULTRASOUND COMPLETE COMPARISON:  CT abdomen and pelvis dated 11/07/2022 FINDINGS: Right Kidney: Length = 10.8 cm AP renal pelvis diameter = <10 mm Normal parenchymal echogenicity with preserved corticomedullary differentiation. No urinary tract dilation or shadowing calculi. The ureter is not seen. Left Kidney: Length = 11.9 cm AP renal pelvis diameter = <10 mm Normal parenchymal echogenicity with preserved corticomedullary differentiation. Interpolar cyst measures 1.5 x 1.1 x 1.1 cm. No urinary tract dilation or shadowing calculi. The ureter is not seen. Bladder: Appears normal for degree of bladder distention. Other: None. IMPRESSION: No urinary tract dilation or shadowing calculi. Electronically Signed   By: Agustin Cree M.D.   On: 04/08/2023 15:36   DG Chest Port 1 View  Result Date: 04/08/2023 CLINICAL DATA:  Shortness of breath and chest pain for 1 week. Anemia. EXAM: PORTABLE CHEST 1 VIEW COMPARISON:  11/07/2022 FINDINGS: The heart size and mediastinal contours are within normal limits. Mild bibasilar scarring again noted. Both lungs are otherwise clear. The visualized skeletal structures are unremarkable. IMPRESSION: No active disease. Electronically Signed   By: Danae Orleans M.D.   On: 04/08/2023 08:17    Cardiac Studies   Echocardiogram: 04/08/2023 IMPRESSIONS     1. Left ventricular ejection fraction, by estimation, is 50 to 55%. The  left  ventricle has low normal function. The left ventricle demonstrates  regional wall motion abnormalities (see scoring diagram/findings for  description). There is mild left  ventricular hypertrophy. Left ventricular diastolic parameters are  consistent with Grade I diastolic dysfunction (impaired relaxation).   2. Right ventricular systolic function is normal. The right ventricular  size is normal. Tricuspid regurgitation signal is inadequate for assessing  PA pressure.   3. The mitral valve is normal in structure. No evidence of mitral valve  regurgitation. No evidence of mitral stenosis.   4. The aortic valve is tricuspid. Aortic valve regurgitation is not  visualized. No aortic stenosis is present.   5. The inferior vena cava is normal in size with greater than 50%  respiratory variability, suggesting right atrial pressure of 3 mmHg.     Assessment & Plan   T wave inversions in modest elevation in troponin  Nonobstructive coronary disease  Recurrent syncope  QT prolongation with hypokalemia persistent  Acute renal injury  Blood pressure volatile    The deep T wave inversions remain I explained that given the only modest elevation in troponin.  Asymmetric T wave inversions are also concerning for CNS pathology and would undertake a CT scan, will probably be done with contrast given his normalized creatinine Nonobstructive coronary disease.  There could have been spasm as a mechanism.  Statins, calcium blockers, nitrates would be indicated.  Aspirin not clearly so we will discontinue; carvedilol is reasonable as it has alpha blockade  Needs aggressive repletion of potassium and now that his renal function has improved, this can be done more aggressively Serial ECGs are important; reviewing his ECGs  back to 2015, they are all associate with QT prolongation apart from the very first 1.  Some of the more recent ECGs are associate with relative sinus tachycardia.  QT is are hard to assess in this situation.  The blood pressure is volatile.  I wonder if he has orthostatic hypotension especially given his history of orthostatic intolerance will order vital signs  Syncope episodes are concerning, particularly the one that occurred briefly while in the car.  His QT was quite low on arrival and this could have been torsade in the context of his hypokalemia; alternatively, the most common mechanism of syncope in a car is vasomotor as suggested by his other symptoms.  He is reminded at this juncture that he is restricted from driving for at least 3 months

## 2023-04-10 NOTE — Plan of Care (Signed)
Will continue to monitor patient.

## 2023-04-11 ENCOUNTER — Inpatient Hospital Stay (HOSPITAL_COMMUNITY): Payer: No Typology Code available for payment source

## 2023-04-11 ENCOUNTER — Other Ambulatory Visit (HOSPITAL_COMMUNITY): Payer: Self-pay

## 2023-04-11 ENCOUNTER — Other Ambulatory Visit: Payer: Self-pay | Admitting: Cardiology

## 2023-04-11 DIAGNOSIS — N179 Acute kidney failure, unspecified: Secondary | ICD-10-CM | POA: Diagnosis not present

## 2023-04-11 DIAGNOSIS — I2510 Atherosclerotic heart disease of native coronary artery without angina pectoris: Secondary | ICD-10-CM | POA: Diagnosis not present

## 2023-04-11 DIAGNOSIS — R9431 Abnormal electrocardiogram [ECG] [EKG]: Secondary | ICD-10-CM | POA: Diagnosis not present

## 2023-04-11 DIAGNOSIS — I214 Non-ST elevation (NSTEMI) myocardial infarction: Secondary | ICD-10-CM | POA: Diagnosis not present

## 2023-04-11 DIAGNOSIS — R55 Syncope and collapse: Secondary | ICD-10-CM

## 2023-04-11 LAB — BASIC METABOLIC PANEL
Anion gap: 7 (ref 5–15)
BUN: <5 mg/dL — ABNORMAL LOW (ref 8–23)
CO2: 20 mmol/L — ABNORMAL LOW (ref 22–32)
Calcium: 8.4 mg/dL — ABNORMAL LOW (ref 8.9–10.3)
Chloride: 109 mmol/L (ref 98–111)
Creatinine, Ser: 0.82 mg/dL (ref 0.61–1.24)
GFR, Estimated: >60 mL/min (ref >60)
Glucose, Bld: 101 mg/dL — ABNORMAL HIGH (ref 70–99)
Potassium: 3.7 mmol/L (ref 3.5–5.1)
Sodium: 136 mmol/L (ref 135–145)

## 2023-04-11 LAB — CBC
HCT: 34.1 % — ABNORMAL LOW (ref 39.0–52.0)
Hemoglobin: 11 g/dL — ABNORMAL LOW (ref 13.0–17.0)
MCH: 31.1 pg (ref 26.0–34.0)
MCHC: 32.3 g/dL (ref 30.0–36.0)
MCV: 96.3 fL (ref 80.0–100.0)
Platelets: 159 10*3/uL (ref 150–400)
RBC: 3.54 MIL/uL — ABNORMAL LOW (ref 4.22–5.81)
RDW: 14.5 % (ref 11.5–15.5)
WBC: 5.5 10*3/uL (ref 4.0–10.5)
nRBC: 0 % (ref 0.0–0.2)

## 2023-04-11 MED ORDER — ASPIRIN 81 MG PO TBEC
81.0000 mg | DELAYED_RELEASE_TABLET | Freq: Every day | ORAL | Status: DC
  Administered 2023-04-11: 81 mg via ORAL
  Filled 2023-04-11: qty 1

## 2023-04-11 MED ORDER — ASPIRIN 81 MG PO TBEC
81.0000 mg | DELAYED_RELEASE_TABLET | Freq: Every day | ORAL | 0 refills | Status: AC
  Filled 2023-04-11: qty 30, 30d supply, fill #0

## 2023-04-11 MED ORDER — CARVEDILOL 3.125 MG PO TABS
3.1250 mg | ORAL_TABLET | Freq: Two times a day (BID) | ORAL | 0 refills | Status: DC
  Filled 2023-04-11: qty 60, 30d supply, fill #0

## 2023-04-11 MED ORDER — LOSARTAN POTASSIUM 25 MG PO TABS
25.0000 mg | ORAL_TABLET | Freq: Every day | ORAL | Status: DC
  Administered 2023-04-11: 25 mg via ORAL
  Filled 2023-04-11: qty 1

## 2023-04-11 MED ORDER — TRAZODONE HCL 50 MG PO TABS
25.0000 mg | ORAL_TABLET | Freq: Every evening | ORAL | 0 refills | Status: DC | PRN
  Filled 2023-04-11: qty 30, 60d supply, fill #0

## 2023-04-11 MED ORDER — EMPAGLIFLOZIN 10 MG PO TABS: 10.0000 mg | ORAL_TABLET | Freq: Every day | ORAL | Status: DC

## 2023-04-11 MED ORDER — LOSARTAN POTASSIUM 25 MG PO TABS
25.0000 mg | ORAL_TABLET | Freq: Every day | ORAL | 0 refills | Status: DC
  Filled 2023-04-11: qty 30, 30d supply, fill #0

## 2023-04-11 MED FILL — Heparin Sodium (Porcine) Inj 1000 Unit/ML: INTRAMUSCULAR | Qty: 10 | Status: AC

## 2023-04-11 MED FILL — Verapamil HCl IV Soln 2.5 MG/ML: INTRAVENOUS | Qty: 2 | Status: AC

## 2023-04-11 NOTE — Progress Notes (Signed)
Ordered 14 day zio for evaluation of syncope, prolonged Qtc. Monitor could not be placed in the hospital as there were EKG staff out on PAL today.   Monitor should be read by Dr. Molli Hazard, PA-C 04/11/2023 11:45 AM

## 2023-04-11 NOTE — Progress Notes (Signed)
Mobility Specialist Progress Note:   04/11/23 1000  Mobility  Activity Ambulated independently in hallway  Level of Assistance Independent  Assistive Device None  Distance Ambulated (ft) 400 ft  Activity Response Tolerated well  Mobility Referral Yes  $Mobility charge 1 Mobility  Mobility Specialist Start Time (ACUTE ONLY) 1035  Mobility Specialist Stop Time (ACUTE ONLY) 1039  Mobility Specialist Time Calculation (min) (ACUTE ONLY) 4 min    Pt received in bed, agreeable to mobility w/ encouragement. Asymptomatic throughout w/ no complaints. Returned to room w/o fault. Pt left in bed with call bell and all needs met.   D'Vante Earlene Plater Mobility Specialist Please contact via Special educational needs teacher or Rehab office at [phone]
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
## 2023-04-11 NOTE — Plan of Care (Signed)
Discharge instructions discussed with patient.  Patient instructed on home medications, restrictions, and follow up appointments. Belongings gathered and sent with patient.  Patients medications will be picked up at [facility] pharmacy   Patient discharged via wheelchair by this writer.

## 2023-04-11 NOTE — Discharge Instructions (Signed)
According to Cardiology, you have been recommended to restrict from driving for at least 3 months or when cleared by your PCP or Cardiologist

## 2023-04-11 NOTE — Discharge Summary (Signed)
Physician Discharge Summary   Patient: Jonathan Benitez MRN: [id number] DOB: [birth year]  Admit date:     04/08/2023  Discharge date: 04/11/23  Discharge Physician: Rickey Barbara   PCP: [name], MD   Recommendations at discharge:    Follow up with PCP in 1-2 weeks Follow up with Cardiology as scheduled Per Cardiology, 14 day zio for eval of syncope and prolonged QTc arranged as outpatient Pt advised against driving for 3 months per Cardiology recs  Discharge Diagnoses: Principal Problem:   NSTEMI (non-ST elevated myocardial infarction) Active Problems:   HFrEF (heart failure with reduced ejection fraction)   HTN (hypertension)   Crohn's disease (HCC)   Gastroesophageal reflux disease without esophagitis   Primary insomnia   Osteoarthritis   AKI (acute kidney injury) (HCC)   Failure to thrive in adult   Hypokalemia   Elevated troponin   Benign prostatic hyperplasia with nocturia   Mixed hyperlipidemia   Hyponatremia   History of recreational drug use   History of alcohol abuse   Hypocalcemia   History of prostate cancer   History of gout   Abnormal EKG  Resolved Problems:   * No resolved hospital problems. *  Hospital Course: 72 year old male with hypertension, history of alcohol use (3-4 beers/day), history of hepatitis C and history of prostate cancer s/p radiotherapy, HFmrEF (EF 45-50%), GERD, hyperlipidemia, AVN s/p left hip replacement, elevated LFTs, h/o recreational substance use (marijuana), recurrent bouts of hyponatremia and hypokalemia presented to ED tcomplaining of intermittent chest pain and progressive shortness of breath for at least 1 week.  He also reported he passed out a couple of times in the last week.  He was found to have an abnormal EKG with prolonged QT, ST wave depression and elevated troponin of 315.  His BNP was 58.  Hgb 14.8.  He was started on IV heparin and was seen by cardiology.  His electrolytes were abnormal with magnesium of 1.2  and potassium of 2.5 with a sodium of 131.  His chest xray was unrevealing.  He viral testing was negative for Covid, influenza and other respiratory viruses.  His renal function was noted to be abnormal with a creatinine of 2.28 which is up from 1.03 on 02/25/23.  Cardiology requested admission for IV heparin which was continued.  On 04/09/23, cardiology requested transfer to [facility] for heart cath.  Assessment and Plan: NSTEMI ruled out, Nonobstructive coronary disease ruled in - pt presented with chest pain and progressive dyspnea -Personally reviewed EKG--sinus rhythm, diffuse T wave inversion -Cardiology was consulted and pt underwent heart cath 11/27, found to have nonobstructive CAD, recs for medical management of CAD -Per Cardiology, etiology for T wave inversions remain unclear. Cardiology recommended head CT. CT was reviewed, negative -Cardiology had recommended ASA daily, coreg 3.125mg  bid, crestor on discharge -Pt to arrange zio as outpatient   Prolonged QT  - lytes were monitored and replaced as needed   AKI / dehydration  -Secondary to volume depletion - renally dose meds and hold nephrotoxic agents - baseline creatinine 0.9-1.0 - presented with serum creatinine 2.28 - held [facility]  -04/08/2023 renal ultrasound--negative hydronephrosis -Renal function improved   Hyponatremia  -Secondary to dehydration  -Improved   Hypocalcemia  - replace as needed   Hypokalemia/Hypomagnesemia -repleting   HFmrEF -01/19/2022 LVEF 45-50%  -04/08/2023 echo EF 50-25%, grade 1 DD, mild LVH   Hyperlipidemia - resume daily rosuvastatin 10 mg  - 02/25/2023 LDL 30, total cholesterol 063, triglycerides 85   Syncopal  episodes - suspect from dehydration as he appears dry and prerenal at time of admission - Per Cardiology recommendation to restrict pt from driving for at least 3 months -Zio to be arranged as outpatient per Cardiology   Gout  - no s/s of acute gout - resumed home allopurinol     Alcohol dependence - CIWA protocol  - supplement thiamine, folic acid, MVI - no signs of withdrawal this visit   BPH with nocturia - resume home nightly tamsulosin 0.4 mg    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, 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 SMA -no post prandial pain -outpt follow up   GERD -continue PPI and TUMS as needed        Consultants: Cardiology Procedures performed: Heart cath  Disposition: Home Diet recommendation:  Cardiac diet DISCHARGE MEDICATION: Allergies as of 04/11/2023       Reactions   Nsaids Other (See Comments)    Anemia, Gastrointestinal hemorrhage        Medication List     STOP taking these medications    empagliflozin 25 MG Tabs tablet Commonly known as: JARDIANCE   sacubitril-valsartan 24-26 MG Commonly known as: ENTRESTO   sildenafil 100 MG tablet Commonly known as: VIAGRA       TAKE these medications    allopurinol 100 MG tablet Commonly known as: ZYLOPRIM Take 100 mg by mouth daily.   aspirin EC 81 MG tablet Take 1 tablet (81 mg total) by mouth daily. Swallow whole. Start taking on: April 12, 2023   carvedilol 3.125 MG tablet Commonly known as: COREG Take 1 tablet (3.125 mg total) by mouth 2 (two) times daily with a meal. What changed:  medication strength how much to take   cetirizine 10 MG tablet Commonly known as: ZYRTEC Take 10 mg by mouth daily.   cyclobenzaprine 10 MG tablet Commonly known as: FLEXERIL Take 10 mg by mouth 2 (two) times daily as needed for muscle spasms.   fluticasone 50 MCG/ACT nasal spray Commonly known as: FLONASE Place 2 sprays into both nostrils daily as needed for allergies.   hydrocortisone 25 MG suppository Commonly known as: ANUSOL-HC Place 1 suppository (25 mg total) rectally 2 (two) times daily as needed for hemorrhoids or anal itching.   losartan 25 MG tablet Commonly known as: COZAAR Take 1 tablet (25 mg total) by mouth daily. Start taking on:  April 12, 2023   magnesium oxide 400 (240 Mg) MG tablet Commonly known as: MAG-OX Take 1 tablet (400 mg total) by mouth 2 (two) times daily.   omeprazole 40 MG capsule Commonly known as: PRILOSEC Take 1 capsule (40 mg total) by mouth daily.   rosuvastatin 10 MG tablet Commonly known as: CRESTOR Take 1 tablet (10 mg total) by mouth daily.   tamsulosin 0.4 MG Caps capsule Commonly known as: FLOMAX Take 1 capsule (0.4 mg total) by mouth daily.   thiamine 100 MG tablet Commonly known as: Vitamin B-1 Take 1 tablet (100 mg total) by mouth daily.   traZODone 50 MG tablet Commonly known as: DESYREL Take 0.5 tablets (25 mg total) by mouth at bedtime as needed for sleep. What changed: how much to take   triamcinolone ointment 0.1 % Commonly known as: KENALOG Apply 1 Application topically 4 (four) times daily as needed (itching).        Follow-up Information     [name], MD Follow up in 2 week(s).   Specialty: Internal Medicine Why: Hospital follow up Contact information: [address] [place] [address] [phone]         Jonelle Sidle, MD Follow up.   Specialty: Cardiology Why: Hospital follow up, as will be scheduled Contact information: [address] [place] [address] [phone]                Discharge Exam: Filed Weights   04/09/23 0500 04/10/23 0329 04/11/23 0433  Weight: 88 kg 84.6 kg 84.6 kg   General exam: Awake, laying in bed, in nad Respiratory system: Normal respiratory effort, no wheezing Cardiovascular system: regular rate, s1, s2 Gastrointestinal system: Soft, nondistended, positive BS Central nervous system: CN2-12 grossly intact, strength intact Extremities: Perfused, no clubbing Skin: Normal skin turgor, no notable skin lesions seen Psychiatry: Mood normal // no visual hallucinations    Condition at discharge: fair  The results of significant diagnostics from this hospitalization (including  imaging, microbiology, ancillary and laboratory) are listed below for reference.   Imaging Studies: CT HEAD WO CONTRAST ( )  Result Date: 04/11/2023 CLINICAL DATA:  Neuro deficit, acute, stroke suspected. EXAM: CT HEAD WITHOUT CONTRAST TECHNIQUE: Contiguous axial images were obtained from the base of the skull through the vertex without intravenous contrast. RADIATION DOSE REDUCTION: This exam was performed according to the departmental dose-optimization program which includes automated exposure control, adjustment of the mA and/or kV according to patient size and/or use of iterative reconstruction technique. COMPARISON:  CT head June 28, 2012. FINDINGS: Brain: No evidence of acute large vascular territory infarction, hemorrhage, hydrocephalus, extra-axial collection or mass lesion/mass effect. Vascular: No hyperdense vessel. Skull: No acute fracture. Sinuses/Orbits: Clear sinuses.  No acute orbital findings. Other: No mastoid effusions IMPRESSION: No evidence of acute intracranial abnormality. Electronically Signed   By: Feliberto Harts M.D.   On: 04/11/2023 09:27   CARDIAC CATHETERIZATION  Result Date: 04/09/2023 Nonobstructive CAD. Normal LVEDP 8 mm Hg Plan: medical management for CAD.,   ECHOCARDIOGRAM COMPLETE  Result Date: 04/08/2023    ECHOCARDIOGRAM REPORT   Patient Name:   Jonathan Benitez Date of Exam: 04/08/2023 Medical Rec #:  [id number]        Height:       72.0 in Accession #:    [id number]       Weight:       195.1 lb Date of Birth:  [birth year]        BSA:          2.108 m Patient Age:    72 years         BP:           110/81 mmHg Patient Gender: M                HR:           79` bpm. [facility] Procedure: 2D Echo, Cardiac Doppler and Color Doppler Indications:    Chest Pain R07.9  History:        Patient has prior history of Echocardiogram examinations, most                 recent 01/20/2022. Cardiomyopathy; Risk Factors:Dyslipidemia and                  Hypertension. Elevated troponin, History of alcohol abuse,                 NSTEMI (non-ST elevated myocardial infarction), Marijuana abuse.  Sonographer:    Celesta Gentile RCS Referring Phys: [id number] Dorothe Pea BRANCH IMPRESSIONS  1. Left ventricular ejection fraction, by estimation, is 50 to 55%. The left ventricle has low normal function. The left ventricle demonstrates regional wall motion abnormalities (see scoring diagram/findings for description). There is mild left ventricular hypertrophy. Left ventricular diastolic parameters are consistent with Grade I diastolic dysfunction (impaired relaxation).  2. Right ventricular systolic function is normal. The right ventricular size is normal. Tricuspid regurgitation signal is inadequate for assessing PA pressure.  3. The mitral valve is normal in structure. No evidence of mitral valve regurgitation. No evidence of mitral stenosis.  4. The aortic valve is tricuspid. Aortic valve regurgitation is not visualized. No aortic stenosis is present.  5. The inferior vena cava is normal in size with greater than 50% respiratory variability, suggesting right atrial pressure of 3 mmHg. FINDINGS  Left Ventricle: Left ventricular ejection fraction, by estimation, is 50 to 55%. The left ventricle has low normal function. The left ventricle demonstrates regional wall motion abnormalities. The left ventricular internal cavity size was normal in size. There is mild left ventricular hypertrophy. Left ventricular diastolic parameters are consistent with Grade I diastolic dysfunction (impaired relaxation). Normal left ventricular filling pressure.  LV Wall Scoring: The mid and distal anterior septum, apical lateral segment, and apex are hypokinetic. Right Ventricle: The right ventricular size is normal. Right vetricular wall thickness was not well visualized. Right ventricular systolic function is normal. Tricuspid regurgitation signal is inadequate for assessing PA pressure. Left  Atrium: Left atrial size was normal in size. Right Atrium: Right atrial size was normal in size. Pericardium: There is no evidence of pericardial effusion. Mitral Valve: The mitral valve is normal in structure. No evidence of mitral valve regurgitation. No evidence of mitral valve stenosis. Tricuspid Valve: The tricuspid valve is normal in structure. Tricuspid valve regurgitation is trivial. No evidence of tricuspid stenosis. Aortic Valve: The aortic valve is tricuspid. Aortic valve regurgitation is not visualized. No aortic stenosis is present. Aortic valve mean gradient measures 2.5 mmHg. Aortic valve peak gradient measures 5.4 mmHg. Aortic valve area, by VTI measures 2.66 cm. Pulmonic Valve: The pulmonic valve was not well visualized. Pulmonic valve regurgitation is not visualized. No evidence of pulmonic stenosis. Aorta: The aortic root is normal in size and structure. Venous: The inferior vena cava is normal in size with greater than 50% respiratory variability, suggesting right atrial pressure of 3 mmHg. IAS/Shunts: No atrial level shunt detected by color flow Doppler.  LEFT VENTRICLE PLAX 2D LVIDd:         4.00 cm     Diastology LVIDs:         2.20 cm     LV e' medial:    3.48 cm/s LV PW:         1.00 cm     LV E/e' medial:  14.0 LV IVS:        1.20 cm     LV e' lateral:   6.64 cm/s LVOT diam:     2.00 cm     LV E/e' lateral: 7.3 LV SV:         51 LV SV Index:   24 LVOT Area:     3.14 cm  LV Volumes (MOD) LV vol d, MOD A2C: 50.5 ml LV vol d, MOD A4C: 94.1 ml LV vol s, MOD A2C: 27.6 ml LV vol s, MOD A4C: 41.8 ml LV SV MOD A2C:     22.9 ml LV SV MOD A4C:     94.1 ml LV SV MOD BP:  35.2 ml RIGHT VENTRICLE RV S prime:     12.10 cm/s TAPSE (M-mode): 2.0 cm LEFT ATRIUM             Index        RIGHT ATRIUM           Index LA diam:        3.80 cm 1.80 cm/m   RA Area:     11.30 cm LA Vol (A2C):   56.2 ml 26.66 ml/m  RA Volume:   25.00 ml  11.86 ml/m LA Vol (A4C):   50.1 ml 23.76 ml/m LA Biplane Vol: 52.9  ml 25.09 ml/m  AORTIC VALVE AV Area (Vmax):    2.31 cm AV Area (Vmean):   2.51 cm AV Area (VTI):     2.66 cm AV Vmax:           116.22 cm/s AV Vmean:          74.055 cm/s AV VTI:            0.190 m AV Peak Grad:      5.4 mmHg AV Mean Grad:      2.5 mmHg LVOT Vmax:         85.50 cm/s LVOT Vmean:        59.100 cm/s LVOT VTI:          0.161 m LVOT/AV VTI ratio: 0.85  AORTA Ao Root diam: 3.50 cm MITRAL VALVE MV Area (PHT): 2.05 cm     SHUNTS MV Decel Time: 370 msec     Systemic VTI:  0.16 m MV E velocity: 48.80 cm/s   Systemic Diam: 2.00 cm MV A velocity: 105.00 cm/s MV E/A ratio:  0.46 Dina Rich MD Electronically signed by Dina Rich MD Signature Date/Time: 04/08/2023/4:19:40 PM    Final    US RENAL  Result Date: 04/08/2023 CLINICAL DATA:  Acute kidney injury EXAM: RENAL / URINARY TRACT ULTRASOUND COMPLETE COMPARISON:  CT abdomen and pelvis dated 11/07/2022 FINDINGS: Right Kidney: Length = 10.8 cm AP renal pelvis diameter = <10 mm Normal parenchymal echogenicity with preserved corticomedullary differentiation. No urinary tract dilation or shadowing calculi. The ureter is not seen. Left Kidney: Length = 11.9 cm AP renal pelvis diameter = <10 mm Normal parenchymal echogenicity with preserved corticomedullary differentiation. Interpolar cyst measures 1.5 x 1.1 x 1.1 cm. No urinary tract dilation or shadowing calculi. The ureter is not seen. Bladder: Appears normal for degree of bladder distention. Other: None. IMPRESSION: No urinary tract dilation or shadowing calculi. Electronically Signed   By: Agustin Cree M.D.   On: 04/08/2023 15:36   DG Chest Port 1 View  Result Date: 04/08/2023 CLINICAL DATA:  Shortness of breath and chest pain for 1 week. Anemia. EXAM: PORTABLE CHEST 1 VIEW COMPARISON:  11/07/2022 FINDINGS: The heart size and mediastinal contours are within normal limits. Mild bibasilar scarring again noted. Both lungs are otherwise clear. The visualized skeletal structures are unremarkable.  IMPRESSION: No active disease. Electronically Signed   By: Danae Orleans M.D.   On: 04/08/2023 08:17    Microbiology: Results for orders placed or performed during the hospital encounter of 04/08/23  Resp panel by RT-PCR (RSV, Flu A&B, Covid) Anterior Nasal Swab     Status: None   Collection Time: 04/08/23  8:08 AM   Specimen: Anterior Nasal Swab  Result Value Ref Range Status   SARS Coronavirus 2 by RT PCR NEGATIVE NEGATIVE Final    Comment: (NOTE) SARS-CoV-2 target nucleic acids are  NOT DETECTED.  The SARS-CoV-2 RNA is generally detectable in upper respiratory specimens during the acute phase of infection. The lowest concentration of SARS-CoV-2 viral copies this assay can detect is 138 copies/mL. A negative result does not preclude SARS-Cov-2 infection and should not be used as the sole basis for treatment or other patient management decisions. A negative result may occur with  improper specimen collection/handling, submission of specimen other than nasopharyngeal swab, presence of viral mutation(s) within the areas targeted by this assay, and inadequate number of viral copies(<138 copies/mL). A negative result must be combined with clinical observations, patient history, and epidemiological information. The expected result is Negative.  Fact Sheet for Patients:  BloggerCourse.com  Fact Sheet for Healthcare Providers:  SeriousBroker.it  This test is no t yet approved or cleared by the [place] FDA and  has been authorized for detection and/or diagnosis of SARS-CoV-2 by FDA under an Emergency Use Authorization (EUA). This EUA will remain  in effect (meaning this test can be used) for the duration of the COVID-19 declaration under Section 564(b)(1) of the Act, 21 U.S.C.section 360bbb-3(b)(1), unless the authorization is terminated  or revoked sooner.       Influenza A by PCR NEGATIVE NEGATIVE Final   Influenza B by PCR  NEGATIVE NEGATIVE Final    Comment: (NOTE) The Xpert Xpress SARS-CoV-2/FLU/RSV plus assay is intended as an aid in the diagnosis of influenza from Nasopharyngeal swab specimens and should not be used as a sole basis for treatment. Nasal washings and aspirates are unacceptable for Xpert Xpress SARS-CoV-2/FLU/RSV testing.  Fact Sheet for Patients: BloggerCourse.com  Fact Sheet for Healthcare Providers: SeriousBroker.it  This test is not yet approved or cleared by the [place] FDA and has been authorized for detection and/or diagnosis of SARS-CoV-2 by FDA under an Emergency Use Authorization (EUA). This EUA will remain in effect (meaning this test can be used) for the duration of the COVID-19 declaration under Section 564(b)(1) of the Act, 21 U.S.C. section 360bbb-3(b)(1), unless the authorization is terminated or revoked.     Resp Syncytial Virus by PCR NEGATIVE NEGATIVE Final    Comment: (NOTE) Fact Sheet for Patients: BloggerCourse.com  Fact Sheet for Healthcare Providers: SeriousBroker.it  This test is not yet approved or cleared by the [place] FDA and has been authorized for detection and/or diagnosis of SARS-CoV-2 by FDA under an Emergency Use Authorization (EUA). This EUA will remain in effect (meaning this test can be used) for the duration of the COVID-19 declaration under Section 564(b)(1) of the Act, 21 U.S.C. section 360bbb-3(b)(1), unless the authorization is terminated or revoked.  Performed at [facility], [address], [place], [place] [address]     Labs: CBC: Recent Labs  Lab 04/08/23 0752 04/09/23 0406 04/09/23 1549 04/10/23 0326 04/11/23 0421  WBC 7.3 5.3 6.4 5.1 5.5  NEUTROABS 4.1  --   --   --   --   HGB 14.8 11.6* 11.8* 11.0* 11.0*  HCT 44.6 36.1* 36.5* 33.4* 34.1*  MCV 95.3 99.2 99.5 96.8 96.3  PLT 246 178 182 165 159   Basic  Metabolic Panel: Recent Labs  Lab 04/08/23 0752 04/09/23 0406 04/09/23 1549 04/10/23 0326 04/11/23 0421  NA 131* 135  --  134* 136  K 2.5* 3.2*  --  3.3* 3.7  CL 97* 105  --  108 109  CO2 19* 21*  --  19* 20*  GLUCOSE 103* 94  --  98 101*  BUN 18 15  --  8 <5*  CREATININE 2.28* 1.26* 1.13 1.05 0.82  CALCIUM 8.8* 8.0*  --  8.0* 8.4*  MG 1.2* 1.8  --  1.6*  --    Liver Function Tests: Recent Labs  Lab 04/08/23 0752  AST 58*  ALT 27  ALKPHOS 55  BILITOT 1.0  PROT 9.2*  ALBUMIN 4.3   CBG: No results for input(s): "GLUCAP" in the last 168 hours.  Discharge time spent: less than 30 minutes.  Signed: Rickey Barbara, MD [facility] 04/11/2023

## 2023-04-12 LAB — LIPOPROTEIN A (LPA): Lipoprotein (a): 95.2 nmol/L — ABNORMAL HIGH (ref <75.0)

## 2023-04-15 ENCOUNTER — Ambulatory Visit: Payer: No Typology Code available for payment source | Attending: Cardiology

## 2023-04-15 DIAGNOSIS — R9431 Abnormal electrocardiogram [ECG] [EKG]: Secondary | ICD-10-CM

## 2023-04-15 DIAGNOSIS — R55 Syncope and collapse: Secondary | ICD-10-CM

## 2023-04-15 NOTE — Progress Notes (Unsigned)
Enrolled patient for a 14 day Zio AT monitor to be mailed to patients home   [place] to read

## 2023-04-20 NOTE — Progress Notes (Unsigned)
   Established Patient Office Visit   Subjective  Patient ID: Jonathan Benitez, male    DOB: Nov 28, 1950  Age: 72 y.o. MRN: [id number]  No chief complaint on file.   He  has a past medical history of GERD (gastroesophageal reflux disease), Hepatitis C, Hypertension, Prostate CA (HCC), and S/P hip replacement, left (11/19/2017).  HPI  ROS    Objective:     There were no vitals taken for this visit. {Vitals History (Optional):23777}  Physical Exam   No results found for any visits on 04/21/23.  The ASCVD Risk score (Arnett DK, et al., 2019) failed to calculate for the following reasons:   The patient has a prior MI or stroke diagnosis    Assessment & Plan:  There are no diagnoses linked to this encounter.  No follow-ups on file.   Cruzita Lederer Newman Nip, FNP

## 2023-04-20 NOTE — Patient Instructions (Signed)

## 2023-04-21 ENCOUNTER — Ambulatory Visit: Payer: Self-pay | Admitting: Family Medicine

## 2023-04-21 DIAGNOSIS — Z09 Encounter for follow-up examination after completed treatment for conditions other than malignant neoplasm: Secondary | ICD-10-CM

## 2023-04-24 ENCOUNTER — Encounter: Payer: Self-pay | Admitting: Internal Medicine

## 2023-04-25 ENCOUNTER — Telehealth: Payer: Self-pay | Admitting: Cardiology

## 2023-04-25 NOTE — Telephone Encounter (Signed)
Tatiana From [place] called to make [place] aware that she has reached out to the patient about his monitor as it  has not been activated yet.

## 2023-04-29 ENCOUNTER — Ambulatory Visit: Payer: No Typology Code available for payment source

## 2023-04-30 ENCOUNTER — Ambulatory Visit: Payer: No Typology Code available for payment source

## 2023-05-01 ENCOUNTER — Telehealth: Payer: Self-pay

## 2023-05-01 NOTE — Telephone Encounter (Signed)
Copied from CRM [id number]. Topic: Clinical - Medical Advice >> May 01, 2023 12:09 PM Geroge Baseman wrote: Reason for CRM: Patient is frustrated because his monitor is not working and can't seem to find the correct person to help him. Requesting a call from clinical staff.

## 2023-05-01 NOTE — Telephone Encounter (Signed)
Spoke to patient

## 2023-05-02 ENCOUNTER — Telehealth: Payer: Self-pay

## 2023-05-02 NOTE — Telephone Encounter (Signed)
Patient was advised yesterday to come in office and we would help him

## 2023-05-02 NOTE — Telephone Encounter (Signed)
Copied from CRM [id number]. Topic: Clinical - Medical Advice >> May 02, 2023 10:42 AM Shelah Lewandowsky wrote: Reason for CRM: Patient needs assistance putting heart monitor on and wants to come to the office to have a nurse put it on. Please call [phone] to advise if he can come to the office to get this put on or if he needs to do something else.

## 2023-05-05 ENCOUNTER — Ambulatory Visit (INDEPENDENT_AMBULATORY_CARE_PROVIDER_SITE_OTHER): Payer: Self-pay

## 2023-05-05 DIAGNOSIS — R9431 Abnormal electrocardiogram [ECG] [EKG]: Secondary | ICD-10-CM

## 2023-05-05 DIAGNOSIS — R55 Syncope and collapse: Secondary | ICD-10-CM

## 2023-05-05 NOTE — Progress Notes (Signed)
Skin prepped and Zio heart monitor placed on upper left chest just below the collarbone and activated for patient successfully.

## 2023-05-13 ENCOUNTER — Ambulatory Visit: Payer: No Typology Code available for payment source | Admitting: Nurse Practitioner

## 2023-05-26 ENCOUNTER — Ambulatory Visit: Payer: No Typology Code available for payment source | Admitting: Nurse Practitioner

## 2023-05-30 ENCOUNTER — Telehealth: Payer: Self-pay

## 2023-05-30 MED ORDER — CARVEDILOL 3.125 MG PO TABS: 3.1250 mg | ORAL_TABLET | Freq: Two times a day (BID) | ORAL | 3 refills | Status: DC

## 2023-05-30 NOTE — Telephone Encounter (Signed)
Called patient advised of below they verbalized understanding Refilled Carvedilol for patient as they were out and [facility] needed a [facility].

## 2023-05-30 NOTE — Telephone Encounter (Signed)
-----   Message from Jonita Albee sent at 05/30/2023  3:55 PM EST ----- Please tell patient that their cardiac monitor showed normal sinus rhythm with HR rnaging from 62-148 BPM (average 88 BPM). There were rare PACs and Rare PVCs (extra beats, not dangerous). There were 24 episodes of SVT (fast heart beats coming from the top of th heart). Episodes were generally short. Overall, nothing to explain his syncope. Should continue carvedilol. He did not go to his appointment with Sharlene Dory on 1/13. This should be rescheduled  Thanks KJ

## 2023-06-10 ENCOUNTER — Ambulatory Visit: Payer: No Typology Code available for payment source | Admitting: Internal Medicine

## 2023-06-16 ENCOUNTER — Encounter: Payer: Self-pay | Admitting: Internal Medicine

## 2023-08-05 ENCOUNTER — Ambulatory Visit (INDEPENDENT_AMBULATORY_CARE_PROVIDER_SITE_OTHER): Payer: Self-pay | Admitting: Internal Medicine

## 2023-08-05 ENCOUNTER — Encounter: Payer: Self-pay | Admitting: Internal Medicine

## 2023-08-05 VITALS — BP 135/82 | HR 103 | Ht 72.0 in | Wt 181.0 lb

## 2023-08-05 DIAGNOSIS — E440 Moderate protein-calorie malnutrition: Secondary | ICD-10-CM

## 2023-08-05 DIAGNOSIS — I255 Ischemic cardiomyopathy: Secondary | ICD-10-CM

## 2023-08-05 DIAGNOSIS — E538 Deficiency of other specified B group vitamins: Secondary | ICD-10-CM

## 2023-08-05 DIAGNOSIS — C61 Malignant neoplasm of prostate: Secondary | ICD-10-CM

## 2023-08-05 DIAGNOSIS — I10 Essential (primary) hypertension: Secondary | ICD-10-CM

## 2023-08-05 DIAGNOSIS — K551 Chronic vascular disorders of intestine: Secondary | ICD-10-CM

## 2023-08-05 DIAGNOSIS — K219 Gastro-esophageal reflux disease without esophagitis: Secondary | ICD-10-CM

## 2023-08-05 DIAGNOSIS — E876 Hypokalemia: Secondary | ICD-10-CM

## 2023-08-05 DIAGNOSIS — F5101 Primary insomnia: Secondary | ICD-10-CM

## 2023-08-05 MED ORDER — MIRTAZAPINE 7.5 MG PO TABS: 7.5000 mg | ORAL_TABLET | Freq: Every day | ORAL | 3 refills | Status: DC

## 2023-08-05 MED ORDER — ESOMEPRAZOLE MAGNESIUM 40 MG PO CPDR: 40.0000 mg | DELAYED_RELEASE_CAPSULE | Freq: Every day | ORAL | 1 refills | Status: DC

## 2023-08-05 NOTE — Assessment & Plan Note (Signed)
 Echo showed LVEF of 50-55% with wall motion abnormalities Had exertional dyspnea Was placed on Coreg, Entresto and Jardiance from [place] [facility], but have been discontinued due to poor compliance, needs to continue Coreg Outpatient follow-up with cardiology

## 2023-08-05 NOTE — Assessment & Plan Note (Signed)
 Started Remeron considering his concurrent weight loss DC Trazodone

## 2023-08-05 NOTE — Assessment & Plan Note (Signed)
Had chronic abdominal pain and lack of appetite, now resolved Referred to vascular surgery - since he had resolution of abdominal pain and lack of appetite, was advised watchful observation for now Needs to take Crestor regularly

## 2023-08-05 NOTE — Assessment & Plan Note (Addendum)
 S/p radiation therapy Has nocturia, had started Tamsulosin, but he stopped it Gets PSA check at [facility]

## 2023-08-05 NOTE — Progress Notes (Signed)
 Established Patient Office Visit  Subjective:  Patient ID: Jonathan Benitez, male    DOB: June 22, 1950  Age: 73 y.o. MRN: [id number]  CC:  Chief Complaint  Patient presents with   Care Management    Follow up, reports not eating well ongoing for the past 2-3 month or longer. Reports some weight loss.     HPI Jonathan Benitez is a 74 y.o. male with past medical history of HTN, ischemic cardiomyopathy, prostate cancer s/p radiotherapy, GERD, gouty arthritis and SMA stenosis who presents for f/u of his chronic medical conditions.  He still complains of decreased appetite.  He reports that he has to smoke marijuana to improve his appetite.  I have recommended to him to avoid marijuana, as it can cause cyclic vomiting syndrome, but he does not agree. He has lost 14 lbs since 09/24. [facility] and [facility] in 12/25. Denies any fever, chills, chronic cough, hemoptysis, night sweats or LAD.  He currently takes trazodone as needed for insomnia. He still reports anhedonia and insomnia. Denies SI ir HI currently.  He was admitted at [facility] in 11/24 for chest pain episodes.  He had cardiac cath, which showed nonobstructive CAD.  He was advised to take aspirin and Crestor, but he has stopped taking them currently.  He has continued to take Coreg 3.125 mg BID, but his losartan was discontinued by [facility] provider.  His BP is WNL today.  Denies chest pain, dyspnea or palpitations currently.  He has lost outpatient cardiology follow-up.    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    Past Surgical History:  Procedure Laterality Date   COLONOSCOPY N/A 03/30/2018   Procedure: COLONOSCOPY;  Surgeon: Malissa Hippo, MD;  [facility];  Service: Endoscopy;  Laterality: N/A;  12:45   COLONOSCOPY WITH PROPOFOL N/A  01/23/2022   Procedure: COLONOSCOPY WITH PROPOFOL;  Surgeon: Lanelle Bal, DO;  Location: [facility];  Service: Endoscopy;  Laterality: N/A;   HERNIA REPAIR     [facility] [place]   LEFT HEART CATH AND CORONARY ANGIOGRAPHY N/A 04/09/2023   Procedure: LEFT HEART CATH AND CORONARY ANGIOGRAPHY;  Surgeon: Swaziland, Peter M, MD;  Location: [facility] INVASIVE CV LAB;  Service: Cardiovascular;  Laterality: N/A;   POLYPECTOMY  03/30/2018   Procedure: POLYPECTOMY;  Surgeon: Malissa Hippo, MD;  Location: [facility];  Service: Endoscopy;;  colon   PROSTATE BIOPSY     positive for cancer   TOTAL HIP ARTHROPLASTY Left    [facility] 11/07/17 Dr. Nancy Fetter    Family History  Problem Relation Age of Onset   Hypertension Mother    Coronary artery disease Mother    Hypertension Father    Stroke Father    Colon cancer Neg Hx    Colon polyps Neg Hx     Social History   Socioeconomic History   Marital status: Widowed    Spouse name: Not on file   Number of children: Not on file   Years of education: Not on file   Highest education level: Not on file  Occupational History   Not on file  Tobacco Use   Smoking status: Never   Smokeless tobacco: Never  Vaping Use   Vaping status: Never Used  Substance and Sexual Activity   Alcohol use: Yes    Comment: was drinking 3-4 beers a day   Drug use: Yes    Types: Marijuana    Comment: Smokes a joint every day   Sexual activity: Not on file  Other Topics Concern   Not on file  Social History Narrative   Not on file   Social Drivers of Health   Financial Resource Strain: Medium Risk (03/26/2021)   Overall Financial Resource Strain (CARDIA)    Difficulty of Paying Living Expenses: Somewhat hard  Food Insecurity: No Food Insecurity (04/08/2023)   Hunger Vital Sign    Worried About Running Out of Food in the Last Year: Never true    Ran Out of Food in the Last Year: Never true  Transportation Needs: No Transportation Needs (04/08/2023)   PRAPARE  - Administrator, Civil Service (Medical): No    Lack of Transportation (Non-Medical): No  Physical Activity: Insufficiently Active (03/26/2021)   Exercise Vital Sign    Days of Exercise per Week: 7 days    Minutes of Exercise per Session: 20 min  Stress: No Stress Concern Present (03/26/2021)   [employer] of Occupational Health - Occupational Stress Questionnaire    Feeling of Stress : Not at all  Social Connections: Socially Isolated (03/26/2021)   Social Connection and Isolation Panel [NHANES]    Frequency of Communication with Friends and Family: More than three times a week    Frequency of Social Gatherings with Friends and Family: More than three times a week    Attends Religious Services: Never    Database administrator or Organizations: No    Attends Banker Meetings: Never    Marital Status: Divorced  Catering manager Violence: Not At Risk (04/08/2023)   Humiliation, Afraid, Rape, and Kick questionnaire    Fear of Current or Ex-Partner: No    Emotionally Abused: No    Physically Abused: No    Sexually Abused: No    Outpatient Medications Prior to Visit  Medication Sig Dispense Refill   allopurinol (ZYLOPRIM) 100 MG tablet Take 100 mg by mouth daily.     cetirizine (ZYRTEC) 10 MG tablet Take 10 mg by mouth daily.     fluticasone (FLONASE) 50 MCG/ACT nasal spray Place 2 sprays into both nostrils daily as needed for allergies.      hydrocortisone (ANUSOL-HC) 25 MG suppository Place 1 suppository (25 mg total) rectally 2 (two) times daily as needed for hemorrhoids or anal itching. 12 suppository 0   magnesium oxide (MAG-OX) 400 (240 Mg) MG tablet Take 1 tablet (400 mg total) by mouth 2 (two) times daily. 30 tablet 1   rosuvastatin (CRESTOR) 10 MG tablet Take 1 tablet (10 mg total) by mouth daily. 30 tablet 5   tamsulosin (FLOMAX) 0.4 MG CAPS capsule Take 1 capsule (0.4 mg total) by mouth daily. 30 capsule 5   thiamine (VITAMIN B-1) 100 MG tablet  Take 1 tablet (100 mg total) by mouth daily. 30 tablet 2   triamcinolone ointment (KENALOG) 0.1 % Apply 1 Application topically 4 (four) times daily as needed (itching).     cyclobenzaprine (FLEXERIL) 10 MG tablet Take 10 mg by mouth 2 (two) times daily as needed for muscle spasms.     omeprazole (PRILOSEC) 40 MG capsule Take 1 capsule (40 mg total) by mouth daily. 30 capsule 3   traZODone (DESYREL) 50 MG tablet Take 0.5 tablets (25 mg total) by mouth at  bedtime as needed for sleep. 30 tablet 0   carvedilol (COREG) 3.125 MG tablet Take 1 tablet (3.125 mg total) by mouth 2 (two) times daily with a meal. 180 tablet 3   losartan (COZAAR) 25 MG tablet Take 1 tablet (25 mg total) by mouth daily. 30 tablet 0   No facility-administered medications prior to visit.    Allergies  Allergen Reactions   Nsaids Other (See Comments)     Anemia, Gastrointestinal hemorrhage    ROS Review of Systems  Constitutional:  Positive for appetite change. Negative for chills and fever.  HENT:  Negative for congestion, sinus pressure and sinus pain.   Respiratory:  Negative for cough and shortness of breath.   Gastrointestinal:  Positive for nausea. Negative for abdominal pain, diarrhea and vomiting.  Genitourinary:  Negative for dysuria and hematuria.  Musculoskeletal:  Negative for neck pain and neck stiffness.  Skin:  Negative for rash.  Neurological:  Negative for dizziness and weakness.  Psychiatric/Behavioral:  Positive for sleep disturbance. Negative for agitation and behavioral problems.       Objective:    Physical Exam Vitals reviewed.  Constitutional:      General: He is not in acute distress.    Appearance: He is not diaphoretic.  HENT:     Head: Normocephalic and atraumatic.     Nose: Nose normal.     Mouth/Throat:     Mouth: Mucous membranes are moist.  Eyes:     General: No scleral icterus.    Extraocular Movements: Extraocular movements intact.  Cardiovascular:     Rate and Rhythm:  Normal rate and regular rhythm.     Heart sounds: Normal heart sounds. No murmur heard. Pulmonary:     Breath sounds: Normal breath sounds. No wheezing or rales.  Abdominal:     Palpations: Abdomen is soft.     Tenderness: There is no abdominal tenderness.  Musculoskeletal:     Cervical back: Neck supple. No tenderness.     Right lower leg: No edema.     Left lower leg: No edema.     Right foot: Bunion present.  Skin:    General: Skin is warm.     Findings: No rash.  Neurological:     General: No focal deficit present.     Mental Status: He is alert and oriented to person, place, and time.     Sensory: No sensory deficit.     Motor: No weakness.  Psychiatric:        Mood and Affect: Mood normal.        Behavior: Behavior normal.     BP 135/82   Pulse (!) 103   Ht 6' (1.829 m)   Wt 181 lb (82.1 kg)   SpO2 97%   BMI 24.55 kg/m  Wt Readings from Last 3 Encounters:  08/05/23 181 lb (82.1 kg)  04/11/23 186 lb 8 oz (84.6 kg)  01/31/23 195 lb 3.2 oz (88.5 kg)    Lab Results  Component Value Date   TSH 1.092 11/07/2022   Lab Results  Component Value Date   WBC 5.5 04/11/2023   HGB 11.0 (L) 04/11/2023   HCT 34.1 (L) 04/11/2023   MCV 96.3 04/11/2023   PLT 159 04/11/2023   Lab Results  Component Value Date   NA 136 04/11/2023   K 3.7 04/11/2023   CO2 20 (L) 04/11/2023   GLUCOSE 101 (H) 04/11/2023   BUN <5 (L) 04/11/2023   CREATININE 0.82 04/11/2023  BILITOT 1.0 04/08/2023   ALKPHOS 55 04/08/2023   AST 58 (H) 04/08/2023   ALT 27 04/08/2023   PROT 9.2 (H) 04/08/2023   ALBUMIN 4.3 04/08/2023   CALCIUM 8.4 (L) 04/11/2023   ANIONGAP 7 04/11/2023   EGFR 77 02/25/2023   Lab Results  Component Value Date   CHOL 125 02/25/2023   Lab Results  Component Value Date   HDL 79 02/25/2023   Lab Results  Component Value Date   LDLCALC 30 02/25/2023   Lab Results  Component Value Date   TRIG 85 02/25/2023   Lab Results  Component Value Date   CHOLHDL 1.6  02/25/2023   Lab Results  Component Value Date   HGBA1C 5.2 02/25/2023      Assessment & Plan:   Problem List Items Addressed This Visit       Cardiovascular and Mediastinum   HTN (hypertension)   BP Readings from Last 1 Encounters:  08/05/23 135/82   Well-controlled with Coreg Counseled for compliance with the medications Advised DASH diet and moderate exercise/walking as tolerated      Cardiomyopathy (HCC)   Echo showed LVEF of 50-55% with wall motion abnormalities Had exertional dyspnea Was placed on Coreg, Entresto and Jardiance from [place] clinic, but have been discontinued due to poor compliance, needs to continue Coreg Outpatient follow-up with cardiology      Relevant Orders   CBC with Differential/Platelet   CMP14+EGFR   TSH + free T4   Superior mesenteric artery stenosis (HCC)   Had chronic abdominal pain and lack of appetite, now resolved Referred to vascular surgery - since he had resolution of abdominal pain and lack of appetite, was advised watchful observation for now Needs to take Crestor regularly        Digestive   Gastroesophageal reflux disease without esophagitis - Primary   Uncontrolled Has tried omeprazole and Pepcid without much relief Currently takes Nexium 20 mg OTC with mild relief Started Nexium 40 mg once daily Needs to avoid marijuana use      Relevant Medications   esomeprazole (NEXIUM) 40 MG capsule     Genitourinary   Prostate cancer (HCC)   S/p radiation therapy Has nocturia, had started Tamsulosin, but he stopped it Gets PSA check at [facility]        Other   Moderate protein-calorie malnutrition (HCC)   BMI Readings from Last 3 Encounters:  08/05/23 24.55 kg/m  04/11/23 25.29 kg/m  01/31/23 26.47 kg/m   Has lost 14 lbs since 09/24 No systemic symptoms like fever, chills Advised to avoid illicit drug use Started Remeron 7.5 mg at bedtime, had responded well in the past Ensure supplements Check CBC, CMP, TSH       Primary insomnia   Started Remeron considering his concurrent weight loss DC Trazodone      Relevant Medications   mirtazapine (REMERON) 7.5 MG tablet   Other Visit Diagnoses       B12 deficiency       Relevant Orders   B12        Meds ordered this encounter  Medications   esomeprazole (NEXIUM) 40 MG capsule    Sig: Take 1 capsule (40 mg total) by mouth daily.    Dispense:  90 capsule    Refill:  1   mirtazapine (REMERON) 7.5 MG tablet    Sig: Take 1 tablet (7.5 mg total) by mouth at bedtime.    Dispense:  30 tablet    Refill:  3  Follow-up: Return in about 2 months (around 10/05/2023).    [name], MD

## 2023-08-05 NOTE — Patient Instructions (Addendum)
 Please start taking Nexium as prescribed.  Please start taking Rosuvastatin for cholesterol.  Please start taking Mirtazepine for insomnia and improving appetite.  Please continue to take at least 3 meals in a day and maintain at least 64 ounces of fluid intake in a day.  Please bring your medications in the next visit for review.

## 2023-08-05 NOTE — Assessment & Plan Note (Addendum)
 BP Readings from Last 1 Encounters:  08/05/23 135/82   Well-controlled with Coreg Counseled for compliance with the medications Advised DASH diet and moderate exercise/walking as tolerated

## 2023-08-05 NOTE — Assessment & Plan Note (Addendum)
 Uncontrolled Has tried omeprazole and Pepcid without much relief Currently takes Nexium 20 mg OTC with mild relief Started Nexium 40 mg once daily Needs to avoid marijuana use

## 2023-08-05 NOTE — Assessment & Plan Note (Addendum)
 BMI Readings from Last 3 Encounters:  08/05/23 24.55 kg/m  04/11/23 25.29 kg/m  01/31/23 26.47 kg/m   Has lost 14 lbs since 09/24 No systemic symptoms like fever, chills Advised to avoid illicit drug use Started Remeron 7.5 mg at bedtime, had responded well in the past Ensure supplements Check CBC, CMP, TSH

## 2023-08-06 DIAGNOSIS — E876 Hypokalemia: Secondary | ICD-10-CM | POA: Insufficient documentation

## 2023-08-06 LAB — CBC WITH DIFFERENTIAL/PLATELET
Basophils Absolute: 0 10*3/uL (ref 0.0–0.2)
Basos: 1 %
EOS (ABSOLUTE): 0.1 10*3/uL (ref 0.0–0.4)
Eos: 1 %
Hematocrit: 37.6 % (ref 37.5–51.0)
Hemoglobin: 12.5 g/dL — ABNORMAL LOW (ref 13.0–17.7)
Immature Grans (Abs): 0 10*3/uL (ref 0.0–0.1)
Immature Granulocytes: 0 %
Lymphocytes Absolute: 2.1 10*3/uL (ref 0.7–3.1)
Lymphs: 37 %
MCH: 32.6 pg (ref 26.6–33.0)
MCHC: 33.2 g/dL (ref 31.5–35.7)
MCV: 98 fL — ABNORMAL HIGH (ref 79–97)
Monocytes Absolute: 1 10*3/uL — ABNORMAL HIGH (ref 0.1–0.9)
Monocytes: 18 %
Neutrophils Absolute: 2.6 10*3/uL (ref 1.4–7.0)
Neutrophils: 43 %
Platelets: 183 10*3/uL (ref 150–450)
RBC: 3.84 x10E6/uL — ABNORMAL LOW (ref 4.14–5.80)
RDW: 13.4 % (ref 11.6–15.4)
WBC: 5.9 10*3/uL (ref 3.4–10.8)

## 2023-08-06 LAB — CMP14+EGFR
ALT: 56 IU/L — ABNORMAL HIGH (ref 0–44)
AST: 46 IU/L — ABNORMAL HIGH (ref 0–40)
Albumin: 4.3 g/dL (ref 3.8–4.8)
Alkaline Phosphatase: 78 IU/L (ref 44–121)
BUN/Creatinine Ratio: 6 — ABNORMAL LOW (ref 10–24)
BUN: 6 mg/dL — ABNORMAL LOW (ref 8–27)
Bilirubin Total: 0.4 mg/dL (ref 0.0–1.2)
CO2: 21 mmol/L (ref 20–29)
Calcium: 9 mg/dL (ref 8.6–10.2)
Chloride: 93 mmol/L — ABNORMAL LOW (ref 96–106)
Creatinine, Ser: 1.08 mg/dL (ref 0.76–1.27)
Globulin, Total: 3.6 g/dL (ref 1.5–4.5)
Glucose: 86 mg/dL (ref 70–99)
Potassium: 3.3 mmol/L — ABNORMAL LOW (ref 3.5–5.2)
Sodium: 134 mmol/L (ref 134–144)
Total Protein: 7.9 g/dL (ref 6.0–8.5)
eGFR: 72 mL/min/{1.73_m2} (ref >59)

## 2023-08-06 LAB — TSH+FREE T4
Free T4: 1.12 ng/dL (ref 0.82–1.77)
TSH: 3.63 u[IU]/mL (ref 0.450–4.500)

## 2023-08-06 LAB — VITAMIN B12: Vitamin B-12: 914 pg/mL (ref 232–1245)

## 2023-08-06 MED ORDER — POTASSIUM CHLORIDE CRYS ER 20 MEQ PO TBCR: 20.0000 meq | EXTENDED_RELEASE_TABLET | Freq: Every day | ORAL | 3 refills | Status: AC

## 2023-08-06 NOTE — Addendum Note (Signed)
 Addended byTrena Platt on: 08/06/2023 07:56 AM   Modules accepted: Orders

## 2023-09-03 ENCOUNTER — Telehealth: Payer: Self-pay

## 2023-09-03 NOTE — Telephone Encounter (Signed)
 Spoke with agent at [facility] in regard to "[facility] Provider - Request for Service" form faxed on 4/7 to [phone].  Request was canceled on 4/15 due to patient not being "drive time eligible" meaning patient lives within 30 minutes of a PCP within the [place] network.  Agent further explained the patient's last authorization with [facility] was discontinued due to drive time eligibility. [facility] informed [facility] of decision.

## 2023-10-23 ENCOUNTER — Ambulatory Visit: Admitting: Internal Medicine

## 2023-11-07 ENCOUNTER — Ambulatory Visit: Payer: Self-pay | Admitting: Family Medicine

## 2023-11-18 ENCOUNTER — Ambulatory Visit: Payer: Self-pay

## 2023-11-18 NOTE — Telephone Encounter (Signed)
  FYI Only or Action Required?: FYI only for provider.  Patient was last seen in primary care on 08/05/2023 by Tobie Suzzane POUR, MD.  Called Nurse Triage reporting Abdominal Pain.  Symptoms began several months ago.  Interventions attempted: OTC medications: Prilosec.  Symptoms are: gradually worsening.  Triage Disposition: No disposition on file.  Patient/caregiver understands and will follow disposition?:    Instructed to go to ER.                   Copied from CRM [id number]. Topic: Clinical - Red Word Triage >> Nov 18, 2023 11:37 AM [place] MATSU wrote: Red Word that prompted transfer to Nurse Triage: pain in stomach with vomiting and loss of appetite Reason for Disposition  [1] MODERATE pain (e.g., interferes with normal activities) AND [2] pain comes and goes (cramps) AND [3] present > 24 hours  (Exception: Pain with Vomiting or Diarrhea - see that Guideline.)  Answer Assessment - Initial Assessment Questions 1. LOCATION: Where does it hurt?      Center and below belly button 2. RADIATION: Does the pain shoot anywhere else? (e.g., chest, back)     Chest and back 3. ONSET: When did the pain begin? (Minutes, hours or days ago)      For a long time 4. SUDDEN: Gradual or sudden onset?     It all depends on what I eat 5. PATTERN Does the pain come and go, or is it constant?    - If it comes and goes: How long does it last? Do you have pain now?     (Note: Comes and goes means the pain is intermittent. It goes away completely between bouts.)    - If constant: Is it getting better, staying the same, or getting worse?      (Note: Constant means the pain never goes away completely; most serious pain is constant and gets worse.)      Comes and goes 6. SEVERITY: How bad is the pain?  (e.g., Scale 1-10; mild, moderate, or severe)    - MILD (1-3): Doesn't interfere with normal activities, abdomen soft and not tender to touch.     - MODERATE (4-7):  Interferes with normal activities or awakens from sleep, abdomen tender to touch.     - SEVERE (8-10): Excruciating pain, doubled over, unable to do any normal activities.       7/10 7. RECURRENT SYMPTOM: Have you ever had this type of stomach pain before? If Yes, ask: When was the last time? and What happened that time?      yes 8. CAUSE: What do you think is causing the stomach pain?     I don't know 9. RELIEVING/AGGRAVATING FACTORS: What makes it better or worse? (e.g., antacids, bending or twisting motion, bowel movement)     I don't know 10. OTHER SYMPTOMS: Do you have any other symptoms? (e.g., back pain, diarrhea, fever, urination pain, vomiting)       Vomiting  Protocols used: Abdominal Pain - Male-A-AH

## 2023-11-19 ENCOUNTER — Encounter (HOSPITAL_COMMUNITY): Payer: Self-pay | Admitting: Emergency Medicine

## 2023-11-19 ENCOUNTER — Emergency Department (HOSPITAL_COMMUNITY)
Admission: EM | Admit: 2023-11-19 | Discharge: 2023-11-19 | Disposition: A | Discharge status: Home / Self Care | Source: Ambulatory Visit | Attending: Emergency Medicine | Admitting: Emergency Medicine

## 2023-11-19 ENCOUNTER — Emergency Department (HOSPITAL_COMMUNITY)

## 2023-11-19 ENCOUNTER — Other Ambulatory Visit: Payer: Self-pay

## 2023-11-19 DIAGNOSIS — K551 Chronic vascular disorders of intestine: Secondary | ICD-10-CM | POA: Diagnosis not present

## 2023-11-19 DIAGNOSIS — R0602 Shortness of breath: Secondary | ICD-10-CM | POA: Insufficient documentation

## 2023-11-19 DIAGNOSIS — R531 Weakness: Secondary | ICD-10-CM | POA: Diagnosis present

## 2023-11-19 LAB — COMPREHENSIVE METABOLIC PANEL WITH GFR
ALT: 49 U/L — ABNORMAL HIGH (ref 0–44)
AST: 107 U/L — ABNORMAL HIGH (ref 15–41)
Albumin: 3.5 g/dL (ref 3.5–5.0)
Alkaline Phosphatase: 56 U/L (ref 38–126)
Anion gap: 14 (ref 5–15)
BUN: 7 mg/dL — ABNORMAL LOW (ref 8–23)
CO2: 23 mmol/L (ref 22–32)
Calcium: 7.9 mg/dL — ABNORMAL LOW (ref 8.9–10.3)
Chloride: 96 mmol/L — ABNORMAL LOW (ref 98–111)
Creatinine, Ser: 1.14 mg/dL (ref 0.61–1.24)
GFR, Estimated: >60 mL/min (ref >60)
Glucose, Bld: 90 mg/dL (ref 70–99)
Potassium: 3.8 mmol/L (ref 3.5–5.1)
Sodium: 133 mmol/L — ABNORMAL LOW (ref 135–145)
Total Bilirubin: 1 mg/dL (ref 0.0–1.2)
Total Protein: 7.5 g/dL (ref 6.5–8.1)

## 2023-11-19 LAB — CBC WITH DIFFERENTIAL/PLATELET
Abs Immature Granulocytes: 0.02 K/uL (ref 0.00–0.07)
Basophils Absolute: 0 K/uL (ref 0.0–0.1)
Basophils Relative: 0 %
Eosinophils Absolute: 0 K/uL (ref 0.0–0.5)
Eosinophils Relative: 1 %
HCT: 32.2 % — ABNORMAL LOW (ref 39.0–52.0)
Hemoglobin: 10.9 g/dL — ABNORMAL LOW (ref 13.0–17.0)
Immature Granulocytes: 0 %
Lymphocytes Relative: 30 %
Lymphs Abs: 1.7 K/uL (ref 0.7–4.0)
MCH: 33.4 pg (ref 26.0–34.0)
MCHC: 33.9 g/dL (ref 30.0–36.0)
MCV: 98.8 fL (ref 80.0–100.0)
Monocytes Absolute: 0.8 K/uL (ref 0.1–1.0)
Monocytes Relative: 14 %
Neutro Abs: 3.2 K/uL (ref 1.7–7.7)
Neutrophils Relative %: 55 %
Platelets: 135 K/uL — ABNORMAL LOW (ref 150–400)
RBC: 3.26 MIL/uL — ABNORMAL LOW (ref 4.22–5.81)
RDW: 16.2 % — ABNORMAL HIGH (ref 11.5–15.5)
WBC: 5.7 K/uL (ref 4.0–10.5)
nRBC: 0 % (ref 0.0–0.2)

## 2023-11-19 LAB — LIPASE, BLOOD: Lipase: 44 U/L (ref 11–51)

## 2023-11-19 LAB — BRAIN NATRIURETIC PEPTIDE: B Natriuretic Peptide: 135 pg/mL — ABNORMAL HIGH (ref 0.0–100.0)

## 2023-11-19 LAB — TROPONIN I (HIGH SENSITIVITY)
Troponin I (High Sensitivity): 7 ng/L (ref <18)
Troponin I (High Sensitivity): 6 ng/L (ref <18)

## 2023-11-19 MED ORDER — IOHEXOL 350 MG/ML SOLN
100.0000 mL | Freq: Once | INTRAVENOUS | Status: AC | PRN
  Administered 2023-11-19: 100 mL via INTRAVENOUS

## 2023-11-19 MED ORDER — PANTOPRAZOLE SODIUM 40 MG IV SOLR
40.0000 mg | Freq: Once | INTRAVENOUS | Status: AC
  Administered 2023-11-19: 40 mg via INTRAVENOUS
  Filled 2023-11-19: qty 10

## 2023-11-19 MED ORDER — SODIUM CHLORIDE 0.9 % IV BOLUS
1000.0000 mL | Freq: Once | INTRAVENOUS | Status: AC
  Administered 2023-11-19: 1000 mL via INTRAVENOUS

## 2023-11-19 NOTE — Discharge Instructions (Signed)
 Follow-up with your family doctor in the next week.  Return sooner if any problems.

## 2023-11-19 NOTE — ED Triage Notes (Signed)
 Pt with c/o decreased appetite x 3 weeks, sob x 1 month, and hx of heart problems. Pt called his PCP and was advised to come to ER for lab work.

## 2023-11-22 NOTE — ED Provider Notes (Signed)
 [place] EMERGENCY DEPARTMENT AT [facility] Provider Note   CSN: [id number] Arrival date & time: 11/19/23  9360     Patient presents with: Multiple complaints   Jonathan Benitez is a 73 y.o. male.   Patient sent over to the emergency department because his magnesium  is low and he has not been eating much for 3 weeks  The history is provided by the patient and medical records. No language interpreter was used.  Weakness Severity:  Moderate Onset quality:  Sudden Timing:  Constant Progression:  Waxing and waning Chronicity:  Recurrent Context: not alcohol use   Relieved by:  Nothing Worsened by:  Nothing Ineffective treatments:  None tried Associated symptoms: no abdominal pain, no chest pain, no cough, no diarrhea, no frequency, no headaches and no seizures        Prior to Admission medications   Medication Sig Start Date End Date Taking? Authorizing Provider  allopurinol  (ZYLOPRIM ) 100 MG tablet Take 200 mg by mouth daily. 04/10/22  Yes [provider]  carvedilol  (COREG ) 6.25 MG tablet Take 6.25 mg by mouth 2 (two) times daily with a meal. 07/18/23  Yes [provider]  cetirizine (ZYRTEC) 10 MG tablet Take 10 mg by mouth daily.   Yes [provider]  diclofenac  Sodium (VOLTAREN ) 1 % GEL Apply 4 g topically 4 (four) times daily. 02/19/23  Yes [provider]  fluticasone  (FLONASE ) 50 MCG/ACT nasal spray Place 2 sprays into both nostrils daily as needed for allergies.    Yes [provider]  magnesium  oxide (MAG-OX) 400 (240 Mg) MG tablet Take 1 tablet (400 mg total) by mouth 2 (two) times daily. 01/23/22  Yes Ricky Fines, MD  potassium chloride  SA (KLOR-CON  M) 20 MEQ tablet Take 1 tablet (20 mEq total) by mouth daily. 08/06/23  Yes Tobie Suzzane POUR, MD  traZODone  (DESYREL ) 50 MG tablet Take 50 mg by mouth at bedtime as needed. for sleep 02/19/23  Yes [provider]  triamcinolone ointment (KENALOG) 0.1 % Apply 1  Application topically 4 (four) times daily as needed (itching). 06/21/22  Yes [provider]    Allergies: Nsaids    Review of Systems  Constitutional:  Negative for appetite change and fatigue.  HENT:  Negative for congestion, ear discharge and sinus pressure.   Eyes:  Negative for discharge.  Respiratory:  Negative for cough.   Cardiovascular:  Negative for chest pain.  Gastrointestinal:  Negative for abdominal pain and diarrhea.  Genitourinary:  Negative for frequency and hematuria.  Musculoskeletal:  Negative for back pain.  Skin:  Negative for rash.  Neurological:  Positive for weakness. Negative for seizures and headaches.  Psychiatric/Behavioral:  Negative for hallucinations.     Updated Vital Signs BP (!) 162/125   Pulse 85   Temp 98.1 F (36.7 C) (Oral)   Resp 18   Ht 6' (1.829 m)   Wt 82 kg   SpO2 97%   BMI 24.52 kg/m   Physical Exam Vitals reviewed.  Constitutional:      Appearance: He is well-developed.  HENT:     Head: Normocephalic.     Nose: Nose normal.  Eyes:     General: No scleral icterus.    Conjunctiva/sclera: Conjunctivae normal.  Neck:     Thyroid: No thyromegaly.  Cardiovascular:     Rate and Rhythm: Normal rate and regular rhythm.     Heart sounds: No murmur heard.    No friction rub. No gallop.  Pulmonary:  Breath sounds: No stridor. No wheezing or rales.  Chest:     Chest wall: No tenderness.  Abdominal:     General: There is no distension.     Tenderness: There is no abdominal tenderness. There is no rebound.  Musculoskeletal:        General: Normal range of motion.     Cervical back: Neck supple.  Lymphadenopathy:     Cervical: No cervical adenopathy.  Skin:    Findings: No erythema or rash.  Neurological:     Mental Status: He is alert and oriented to person, place, and time.     Motor: No abnormal muscle tone.     Coordination: Coordination normal.  Psychiatric:        Behavior: Behavior normal.     (all  labs ordered are listed, but only abnormal results are displayed) Labs Reviewed  CBC WITH DIFFERENTIAL/PLATELET - Abnormal; Notable for the following components:      Result Value   RBC 3.26 (*)    Hemoglobin 10.9 (*)    HCT 32.2 (*)    RDW 16.2 (*)    Platelets 135 (*)    All other components within normal limits  COMPREHENSIVE METABOLIC PANEL WITH GFR - Abnormal; Notable for the following components:   Sodium 133 (*)    Chloride 96 (*)    BUN 7 (*)    Calcium  7.9 (*)    AST 107 (*)    ALT 49 (*)    All other components within normal limits  BRAIN NATRIURETIC PEPTIDE - Abnormal; Notable for the following components:   B Natriuretic Peptide 135.0 (*)    All other components within normal limits  LIPASE, BLOOD  TROPONIN I (HIGH SENSITIVITY)  TROPONIN I (HIGH SENSITIVITY)    EKG: None  Radiology: No results found.   Procedures   Medications Ordered in the ED  sodium chloride  0.9 % bolus 1,000 mL (0 mLs Intravenous Stopped 11/19/23 1055)  pantoprazole  (PROTONIX ) injection 40 mg (40 mg Intravenous Given 11/19/23 0835)  iohexol  (OMNIPAQUE ) 350 MG/ML injection 100 mL (100 mLs Intravenous Contrast Given 11/19/23 0937)   I spoke to GI about the chronic mesenteric ischemia.  And it was felt the patient should follow-up as an outpatient.  He was given magnesium  for his low magnesium                                  Medical Decision Making Amount and/or Complexity of Data Reviewed Labs: ordered. Radiology: ordered.  Risk Prescription drug management.   Patient with low magnesium  and chronic mesenteric ischemia.  He will follow-up with his GI doctor and his family doctor     Final diagnoses:  Hypomagnesemia  Chronic mesenteric ischemia [employer])    ED Discharge Orders     None          Suzette Pac, MD 11/22/23 1012

## 2023-12-22 ENCOUNTER — Ambulatory Visit: Admitting: Internal Medicine

## 2024-01-03 ENCOUNTER — Other Ambulatory Visit (HOSPITAL_COMMUNITY): Payer: Self-pay

## 2024-01-05 ENCOUNTER — Telehealth: Payer: Self-pay | Admitting: Internal Medicine

## 2024-01-05 NOTE — Telephone Encounter (Signed)
 Copied from CRM #[id number]. Topic: Appointments - Scheduling Inquiry for Clinic >> Jan 05, 2024  9:44 AM Lavanda D wrote: Reason for CRM: Arthritis pain and GERD: Medication that he was precribed for arthritis pain has been making him sick to his stomach, he said it has been irritating his digestive system. He said with both the abdominal/knee pain. Patient declined NT and said he would just like to talk to Dr. Tobie about it.

## 2024-01-23 ENCOUNTER — Telehealth: Payer: Self-pay | Admitting: Internal Medicine

## 2024-01-23 ENCOUNTER — Telehealth: Payer: Self-pay

## 2024-01-23 ENCOUNTER — Encounter: Payer: Self-pay | Admitting: Internal Medicine

## 2024-01-23 DIAGNOSIS — K551 Chronic vascular disorders of intestine: Secondary | ICD-10-CM

## 2024-01-23 DIAGNOSIS — K219 Gastro-esophageal reflux disease without esophagitis: Secondary | ICD-10-CM

## 2024-01-23 DIAGNOSIS — I255 Ischemic cardiomyopathy: Secondary | ICD-10-CM

## 2024-01-23 MED ORDER — ESOMEPRAZOLE MAGNESIUM 40 MG PO CPDR: 40.0000 mg | DELAYED_RELEASE_CAPSULE | Freq: Every day | ORAL | 5 refills | Status: AC

## 2024-01-23 MED ORDER — SUCRALFATE 1 G PO TABS: 1.0000 g | ORAL_TABLET | Freq: Three times a day (TID) | ORAL | 0 refills | Status: AC

## 2024-01-23 NOTE — Assessment & Plan Note (Signed)
 His chronic abdominal pain is likely related to GERD and chronic mesenteric ischemia Recent CT abdomen reviewed, has history of chronic mesenteric ischemia and SMA stenosis Needs GI evaluation, local GI referral sent

## 2024-01-23 NOTE — Telephone Encounter (Signed)
 Copied from CRM [id number]. Topic: General - Other >> Jan 23, 2024  9:55 AM [name] wrote: Reason for CRM: Patient is calling in because he says his stomach is bothering him and he wants to know if Dr. Tobie can call him during his appointment time instead of him coming in because he is continuously going to the bathroom and can't make it in office.

## 2024-01-23 NOTE — Assessment & Plan Note (Signed)
 Echo showed LVEF of 50-55% with wall motion abnormalities Had exertional dyspnea Was placed on Coreg, Entresto and Jardiance from [place] [facility], but have been discontinued due to poor compliance, needs to continue Coreg Outpatient follow-up with cardiology

## 2024-01-23 NOTE — Assessment & Plan Note (Addendum)
 Uncontrolled Has tried omeprazole , pantoprazole  and Pepcid  without much relief Started Nexium  40 mg once daily Sucralfate  as needed for persistent epigastric pain Needs to avoid marijuana use

## 2024-01-23 NOTE — Progress Notes (Signed)
 Virtual Visit via Video Note   Because of Jonathan Benitez's co-morbid illnesses, he is at least at moderate risk for complications without adequate follow up.  This format is felt to be most appropriate for this patient at this time.  All issues noted in this document were discussed and addressed.  A limited physical exam was performed with this format.      Evaluation Performed:  Follow-up visit  Date:  01/23/2024   ID:  Jonathan Benitez, Jonathan Benitez 01-28-1951, MRN [id number]  Patient Location: Home Provider Location: Office/Clinic  Participants: Patient Location of Patient: Home Location of Provider: Telehealth Consent was obtain for visit to be over via telehealth. I verified that I am speaking with the correct person using two identifiers.  PCP:  Tobie Suzzane POUR, MD   Chief Complaint: Abdominal pain and follow-up of chronic medical conditions  History of Present Illness:    Jonathan Benitez is a 73 y.o. male with PMH of HTN, ischemic cardiomyopathy, prostate cancer s/p radiotherapy, GERD, gouty arthritis and SMA stenosis who has a video visit for follow-up of his chronic medical conditions and complaint of recent worsening of abdominal pain.  He reports recent worsening of epigastric pain, which is intermittent, worse after eating, and nonradiating.  He was given Nexium  in the last visit, but it is unclear whether he received it from the pharmacy.  He has tried omeprazole , pantoprazole  and Pepcid  in the past without much relief.  He still complains of decreased appetite.  He reports that he has to smoke marijuana to improve his appetite.  I have repeatedly recommended to him to avoid marijuana, as it can cause cyclic vomiting syndrome, but he does not agree. [facility], and has colonoscopy in 12/25. Denies any fever, chills, chronic cough, hemoptysis, night sweats or LAD.  He went to ER in 07/25 for similar complaints, had CT abdomen done, which showed SMA stenosis and  chronic mesenteric ischemia.  He has not been able to see local GI since then.  HTN and ischemic cardiomyopathy: He takes Coreg  for it, followed by [facility] cardiology.  Denies any recent chest pain, dyspnea or palpitations currently.  He currently takes trazodone  as needed for insomnia. He still reports anhedonia and insomnia. Denies SI ir HI currently.    The patient does not have symptoms concerning for COVID-19 infection (fever, chills, cough, or new shortness of breath).   Past Medical, Surgical, Social History, Allergies, and Medications have been Reviewed.  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   Past Surgical History:  Procedure Laterality Date   COLONOSCOPY N/A 03/30/2018   Procedure: COLONOSCOPY;  Surgeon: Golda Claudis PENNER, MD;  [facility];  Service: Endoscopy;  Laterality: N/A;  12:45   COLONOSCOPY WITH PROPOFOL  N/A 01/23/2022   Procedure: COLONOSCOPY WITH PROPOFOL ;  Surgeon: Cindie Carlin POUR, DO;  Location: [facility];  Service: Endoscopy;  Laterality: N/A;   HERNIA REPAIR     [place]   LEFT HEART CATH AND CORONARY ANGIOGRAPHY N/A 04/09/2023   Procedure: LEFT HEART CATH AND CORONARY ANGIOGRAPHY;  Surgeon: [place], Peter M, MD;  Location: [facility] INVASIVE CV LAB;  Service: Cardiovascular;  Laterality: N/A;   POLYPECTOMY  03/30/2018   Procedure: POLYPECTOMY;  Surgeon: Golda Claudis PENNER, MD;  Location: [facility];  Service: Endoscopy;;  colon   PROSTATE BIOPSY     positive for cancer   TOTAL HIP ARTHROPLASTY Left    [facility] 11/07/17 Dr. Jayson Crome     Current Meds  Medication Sig   esomeprazole  (NEXIUM ) 40 MG capsule Take 1 capsule (40 mg total) by mouth daily at 12 noon.   sucralfate  (CARAFATE ) 1 g tablet Take 1 tablet (1 g total) by mouth 4 (four) times daily -  with meals and at bedtime.      Allergies:   Nsaids   ROS:   Please see the history of present illness. All other systems reviewed and are negative.   Labs/Other Tests and Data Reviewed:    Recent Labs: 04/10/2023: Magnesium  1.6 08/05/2023: TSH 3.630 11/19/2023: ALT 49; B Natriuretic Peptide 135.0; BUN 7; Creatinine, Ser 1.14; Hemoglobin 10.9; Platelets 135; Potassium 3.8; Sodium 133   Recent Lipid Panel Lab Results  Component Value Date/Time   CHOL 125 02/25/2023 08:02 AM   TRIG 85 02/25/2023 08:02 AM   HDL 79 02/25/2023 08:02 AM   CHOLHDL 1.6 02/25/2023 08:02 AM   LDLCALC 30 02/25/2023 08:02 AM    Wt Readings from Last 3 Encounters:  11/19/23 180 lb 12.4 oz (82 kg)  08/05/23 181 lb (82.1 kg)  04/11/23 186 lb 8 oz (84.6 kg)     Objective:    Vital Signs:  There were no vitals taken for this visit.   VITAL SIGNS:  reviewed GEN:  no acute distress EYES:  sclerae anicteric, EOMI - Extraocular Movements Intact RESPIRATORY:  normal respiratory effort, symmetric expansion NEURO:  alert and oriented x 3, no obvious focal deficit PSYCH:  normal affect  ASSESSMENT & PLAN:    Chronic mesenteric ischemia (HCC) His chronic abdominal pain is likely related to GERD and chronic mesenteric ischemia Recent CT abdomen reviewed, has history of chronic mesenteric ischemia and SMA stenosis Needs GI evaluation, local GI referral sent  Gastroesophageal reflux disease Uncontrolled Has tried omeprazole , pantoprazole  and Pepcid  without much relief Started Nexium  40 mg once daily Sucralfate  as needed for persistent epigastric pain Needs to avoid marijuana use  Cardiomyopathy (HCC) Echo showed LVEF of 50-55% with wall motion abnormalities Had exertional dyspnea Was placed on Coreg , Entresto  and Jardiance  from [place] [facility], but have been discontinued due to poor compliance, needs to continue Coreg  Outpatient follow-up with cardiology    I discussed the assessment and treatment plan with the patient. The patient was  provided an opportunity to ask questions, and all were answered. The patient agreed with the plan and demonstrated an understanding of the instructions.   The patient was advised to call back or seek an in-person evaluation if the symptoms worsen or if the condition fails to improve as anticipated.  The above assessment and management plan was discussed with the patient. The patient verbalized understanding of and has agreed to the management plan.   Medication Adjustments/Labs and Tests Ordered: Current medicines are reviewed at length with the patient today.  Concerns regarding medicines are outlined above.   Tests Ordered: Orders Placed This Encounter  Procedures   Ambulatory referral to Gastroenterology    Medication Changes: Meds ordered this encounter  Medications   esomeprazole  (NEXIUM ) 40 MG capsule    Sig: Take 1 capsule (40 mg total) by mouth daily at 12 noon.    Dispense:  30 capsule    Refill:  5   sucralfate  (CARAFATE ) 1 g tablet    Sig: Take  1 tablet (1 g total) by mouth 4 (four) times daily -  with meals and at bedtime.    Dispense:  60 tablet    Refill:  0     Note: This dictation was prepared with Dragon dictation along with smaller phrase technology. Similar sounding words can be transcribed inadequately or may not be corrected upon review. Any transcriptional errors that result from this process are unintentional.      Disposition:  Follow up  Signed, Suzzane MARLA Blanch, MD  01/23/2024 11:55 AM     [facility]

## 2024-01-23 NOTE — Patient Instructions (Signed)
 Please start taking Nexium  as prescribed.  Please take sucralfate  as needed for persistent acid reflux.  Please follow-up with local GI for persistent abdominal pain.

## 2024-02-04 ENCOUNTER — Emergency Department (HOSPITAL_COMMUNITY)
Admission: EM | Admit: 2024-02-04 | Discharge: 2024-02-04 | Disposition: A | Discharge status: Home / Self Care | Attending: Emergency Medicine | Admitting: Emergency Medicine

## 2024-02-04 ENCOUNTER — Emergency Department (HOSPITAL_COMMUNITY)

## 2024-02-04 ENCOUNTER — Other Ambulatory Visit: Payer: Self-pay

## 2024-02-04 ENCOUNTER — Encounter (HOSPITAL_COMMUNITY): Payer: Self-pay

## 2024-02-04 DIAGNOSIS — W01198A Fall on same level from slipping, tripping and stumbling with subsequent striking against other object, initial encounter: Secondary | ICD-10-CM | POA: Diagnosis not present

## 2024-02-04 DIAGNOSIS — S022XXA Fracture of nasal bones, initial encounter for closed fracture: Secondary | ICD-10-CM | POA: Insufficient documentation

## 2024-02-04 DIAGNOSIS — Z23 Encounter for immunization: Secondary | ICD-10-CM | POA: Insufficient documentation

## 2024-02-04 DIAGNOSIS — S50311A Abrasion of right elbow, initial encounter: Secondary | ICD-10-CM | POA: Diagnosis not present

## 2024-02-04 DIAGNOSIS — R55 Syncope and collapse: Secondary | ICD-10-CM | POA: Insufficient documentation

## 2024-02-04 DIAGNOSIS — S0992XA Unspecified injury of nose, initial encounter: Secondary | ICD-10-CM | POA: Diagnosis present

## 2024-02-04 DIAGNOSIS — S0083XA Contusion of other part of head, initial encounter: Secondary | ICD-10-CM | POA: Insufficient documentation

## 2024-02-04 LAB — BASIC METABOLIC PANEL WITH GFR
Anion gap: 12 (ref 5–15)
BUN: <5 mg/dL — ABNORMAL LOW (ref 8–23)
CO2: 26 mmol/L (ref 22–32)
Calcium: 8.7 mg/dL — ABNORMAL LOW (ref 8.9–10.3)
Chloride: 96 mmol/L — ABNORMAL LOW (ref 98–111)
Creatinine, Ser: 0.83 mg/dL (ref 0.61–1.24)
GFR, Estimated: >60 mL/min (ref >60)
Glucose, Bld: 89 mg/dL (ref 70–99)
Potassium: 3.2 mmol/L — ABNORMAL LOW (ref 3.5–5.1)
Sodium: 134 mmol/L — ABNORMAL LOW (ref 135–145)

## 2024-02-04 LAB — CBC WITH DIFFERENTIAL/PLATELET
Abs Immature Granulocytes: 0.03 K/uL (ref 0.00–0.07)
Basophils Absolute: 0 K/uL (ref 0.0–0.1)
Basophils Relative: 0 %
Eosinophils Absolute: 0.1 K/uL (ref 0.0–0.5)
Eosinophils Relative: 1 %
HCT: 31.2 % — ABNORMAL LOW (ref 39.0–52.0)
Hemoglobin: 10.7 g/dL — ABNORMAL LOW (ref 13.0–17.0)
Immature Granulocytes: 1 %
Lymphocytes Relative: 31 %
Lymphs Abs: 1.7 K/uL (ref 0.7–4.0)
MCH: 34.5 pg — ABNORMAL HIGH (ref 26.0–34.0)
MCHC: 34.3 g/dL (ref 30.0–36.0)
MCV: 100.6 fL — ABNORMAL HIGH (ref 80.0–100.0)
Monocytes Absolute: 1.1 K/uL — ABNORMAL HIGH (ref 0.1–1.0)
Monocytes Relative: 20 %
Neutro Abs: 2.5 K/uL (ref 1.7–7.7)
Neutrophils Relative %: 47 %
Platelets: 186 K/uL (ref 150–400)
RBC: 3.1 MIL/uL — ABNORMAL LOW (ref 4.22–5.81)
RDW: 14.2 % (ref 11.5–15.5)
WBC: 5.4 K/uL (ref 4.0–10.5)
nRBC: 0 % (ref 0.0–0.2)

## 2024-02-04 LAB — CBG MONITORING, ED: Glucose-Capillary: 105 mg/dL — ABNORMAL HIGH (ref 70–99)

## 2024-02-04 MED ORDER — POTASSIUM CHLORIDE CRYS ER 20 MEQ PO TBCR
40.0000 meq | EXTENDED_RELEASE_TABLET | Freq: Once | ORAL | Status: AC
Start: 2024-02-04 — End: 2024-02-04
  Administered 2024-02-04: 40 meq via ORAL
  Filled 2024-02-04: qty 2

## 2024-02-04 MED ORDER — SODIUM CHLORIDE 0.9 % IV BOLUS
500.0000 mL | Freq: Once | INTRAVENOUS | Status: DC
Start: 2024-02-04 — End: 2024-02-04

## 2024-02-04 MED ORDER — SODIUM CHLORIDE 0.9 % IV BOLUS
1000.0000 mL | Freq: Once | INTRAVENOUS | Status: AC
  Administered 2024-02-04: 1000 mL via INTRAVENOUS

## 2024-02-04 MED ORDER — TETANUS-DIPHTH-ACELL PERTUSSIS 5-2.5-18.5 LF-MCG/0.5 IM SUSY
0.5000 mL | PREFILLED_SYRINGE | Freq: Once | INTRAMUSCULAR | Status: AC
Start: 2024-02-04 — End: 2024-02-04
  Administered 2024-02-04: 0.5 mL via INTRAMUSCULAR
  Filled 2024-02-04: qty 0.5

## 2024-02-04 NOTE — Discharge Instructions (Signed)
 You were seen in the emergency department after a fainting spell in which she fell and hit your face.  Your CAT scan did not show any internal bleeding but you did have a possible new nasal fracture.  Please use ice 20 minutes on 20 minutes off while awake.  Tylenol  for pain.  Keep well-hydrated.  Follow-up with your primary care doctor.  Return to the emergency department if any worsening or concerning symptoms

## 2024-02-04 NOTE — ED Notes (Signed)
Patient ambulated down hallway

## 2024-02-04 NOTE — ED Triage Notes (Addendum)
 Patient BIB [facility] from home, for complaint of a syncopal episode, fell and hit his head on a parking space concrete stopper. No sure if he is on blood thinners. Stated I did smoke weed, decrease intake seeing a doctor for the decreased intake, I did get dizzy prior to passing out. Denise n/v.

## 2024-02-04 NOTE — ED Provider Notes (Signed)
 [place] EMERGENCY DEPARTMENT AT [facility] Provider Note   CSN: [id number] Arrival date & time: 02/04/24  9243     Patient presents with: Loss of Consciousness   Jonathan Benitez is a 73 y.o. male.  He is brought in by ambulance after a syncopal event and head injury.  He said he was getting out of his car this morning and passed out.  Had some prodromal dizziness when he stood up getting out of the car.  Struck his face on the concrete.  Complaining of moderate headache and facial pain.  Abrasion to right elbow.  Otherwise denies any injury.  States he thinks he probably is dehydrated, does not drink enough fluids.  Smoked marijuana this morning . no chest pain shortness of breath vomiting diarrhea urinary symptoms.   The history is provided by the patient and the EMS personnel.  Loss of Consciousness Episode history:  Single Most recent episode:  Today Progression:  Resolved Chronicity:  New Context: standing up   Associated symptoms: dizziness and headaches   Associated symptoms: no chest pain, no difficulty breathing, no fever, no focal weakness, no nausea, no shortness of breath and no vomiting        Prior to Admission medications   Medication Sig Start Date End Date Taking? Authorizing Provider  allopurinol  (ZYLOPRIM ) 100 MG tablet Take 200 mg by mouth daily. 04/10/22   [provider]  carvedilol  (COREG ) 6.25 MG tablet Take 6.25 mg by mouth 2 (two) times daily with a meal. 07/18/23   [provider]  cetirizine (ZYRTEC) 10 MG tablet Take 10 mg by mouth daily.    [provider]  diclofenac  Sodium (VOLTAREN ) 1 % GEL Apply 4 g topically 4 (four) times daily. 02/19/23   [provider]  esomeprazole  (NEXIUM ) 40 MG capsule Take 1 capsule (40 mg total) by mouth daily at 12 noon. 01/23/24   Tobie Suzzane POUR, MD  fluticasone  (FLONASE ) 50 MCG/ACT nasal spray Place 2 sprays into both nostrils daily as needed for allergies.     [provider]  magnesium  oxide (MAG-OX) 400 (240 Mg) MG tablet Take 1 tablet (400 mg total) by mouth 2 (two) times daily. 01/23/22   Ricky Fines, MD  potassium chloride  SA (KLOR-CON  M) 20 MEQ tablet Take 1 tablet (20 mEq total) by mouth daily. 08/06/23   Tobie Suzzane POUR, MD  sucralfate  (CARAFATE ) 1 g tablet Take 1 tablet (1 g total) by mouth 4 (four) times daily -  with meals and at bedtime. 01/23/24   Tobie Suzzane POUR, MD  traZODone  (DESYREL ) 50 MG tablet Take 50 mg by mouth at bedtime as needed. for sleep 02/19/23   [provider]  triamcinolone ointment (KENALOG) 0.1 % Apply 1 Application topically 4 (four) times daily as needed (itching). 06/21/22   [provider]    Allergies: Nsaids    Review of Systems  Constitutional:  Negative for fever.  Respiratory:  Negative for shortness of breath.   Cardiovascular:  Positive for syncope. Negative for chest pain.  Gastrointestinal:  Negative for nausea and vomiting.  Neurological:  Positive for dizziness and headaches. Negative for focal weakness.    Updated Vital Signs BP (!) 162/89   Pulse 75   Temp 97.8 F (36.6 C) (Oral)   Resp 20   Ht 6' (1.829 m)   Wt 83.9 kg   SpO2 99%   BMI 25.09 kg/m   Physical Exam Vitals and nursing note reviewed.  Constitutional:  General: He is not in acute distress.    Appearance: Normal appearance. He is well-developed.  HENT:     Head: Normocephalic.     Comments: He has a large hematoma above his left eye with some small abrasions.  Nasal abrasion. Eyes:     Conjunctiva/sclera: Conjunctivae normal.  Cardiovascular:     Rate and Rhythm: Normal rate and regular rhythm.     Heart sounds: No murmur heard. Pulmonary:     Effort: Pulmonary effort is normal. No respiratory distress.     Breath sounds: Normal breath sounds.  Abdominal:     Palpations: Abdomen is soft.     Tenderness: There is no abdominal tenderness. There is no guarding or rebound.  Musculoskeletal:         General: Tenderness and signs of injury present.     Cervical back: Neck supple.     Comments: Small abrasion right elbow.  Full range of motion of upper and lower extremities without any limitations.  Skin:    General: Skin is warm and dry.     Capillary Refill: Capillary refill takes less than 2 seconds.  Neurological:     General: No focal deficit present.     Mental Status: He is alert and oriented to person, place, and time.     Sensory: No sensory deficit.     Motor: No weakness.  Psychiatric:        Mood and Affect: Mood normal.     (all labs ordered are listed, but only abnormal results are displayed) Labs Reviewed  BASIC METABOLIC PANEL WITH GFR - Abnormal; Notable for the following components:      Result Value   Sodium 134 (*)    Potassium 3.2 (*)    Chloride 96 (*)    BUN <5 (*)    Calcium  8.7 (*)    All other components within normal limits  CBC WITH DIFFERENTIAL/PLATELET - Abnormal; Notable for the following components:   RBC 3.10 (*)    Hemoglobin 10.7 (*)    HCT 31.2 (*)    MCV 100.6 (*)    MCH 34.5 (*)    Monocytes Absolute 1.1 (*)    All other components within normal limits  CBG MONITORING, ED - Abnormal; Notable for the following components:   Glucose-Capillary 105 (*)    All other components within normal limits    EKG: EKG Interpretation Date/Time:  Wednesday February 04 2024 08:07:24 EDT Ventricular Rate:  72 PR Interval:  175 QRS Duration:  99 QT Interval:  411 QTC Calculation: 450 R Axis:   -41  Text Interpretation: Sinus rhythm Left anterior fascicular block improved ST,Ts from prior 11/24 Confirmed by Towana Sharper [phone]) on 02/04/2024 8:09:10 AM  Radiology: CT Head Wo Contrast Result Date: 02/04/2024 CLINICAL DATA:  Provided history: Head trauma, minor. Facial trauma, blunt. Additional history provided: Syncopal episode. EXAM: CT HEAD WITHOUT CONTRAST CT MAXILLOFACIAL WITHOUT CONTRAST TECHNIQUE: Multidetector CT imaging of the head  and maxillofacial structures were performed using the standard protocol without intravenous contrast. Multiplanar CT image reconstructions of the maxillofacial structures were also generated. RADIATION DOSE REDUCTION: This exam was performed according to the departmental dose-optimization program which includes automated exposure control, adjustment of the mA and/or kV according to patient size and/or use of iterative reconstruction technique. COMPARISON:  Head CT 04/11/2023. FINDINGS: CT HEAD FINDINGS Brain: Generalized cerebral atrophy. Patchy and ill-defined hypoattenuation within the cerebral white matter, nonspecific but compatible with mild chronic small vessel ischemic disease. There  is no acute intracranial hemorrhage. No demarcated cortical infarct. No extra-axial fluid collection. No evidence of an intracranial mass. No midline shift. Vascular: No hyperdense vessel.  Atherosclerotic calcifications. Skull: No calvarial fracture or aggressive osseous lesion. Other: Sizable forehead and periorbital hematoma on the left. Smaller left facial hematoma. CT MAXILLOFACIAL FINDINGS Osseous: Redemonstrated chronic medially displaced lamina papyracea fractures bilaterally. Displaced left nasal bone fracture, new from the prior head CT of 04/11/2023 but otherwise age-indeterminate. Orbits: Sizable forehead and periorbital hematoma on the left. The globes are normal in size and contour. No retrobulbar hematoma. Sinuses: Trace secretions within the bilateral maxillary sinuses. Minimal mucosal thickening scattered within bilateral ethmoid air cells. Soft tissues: Sizable forehead and periorbital hematoma on the left. Other: Incompletely assessed cervical spondylosis. Nonspecific reversal of the expected cervical lordosis. Grade 1 anterolisthesis at C2-C3, C3-C4 and C4-C5. Grade 1 retrolisthesis at C5-C6 and C6-C7. IMPRESSION: CT head: 1.  No evidence of an acute intracranial abnormality. 2. Parenchymal atrophy and chronic  small vessel ischemic disease. 3. Sizable forehead and periorbital hematoma on the left. CT maxillofacial: 1. Displaced left nasal bone fracture, new from the prior head CT of 04/11/2023 but otherwise age-indeterminate. 2. Redemonstrated chronic medially displaced lamina papyracea fractures bilaterally. 3. Sizable forehead and periorbital hematoma on the left. 4. Smaller left facial hematoma. 5. Minor paranasal sinus disease as described. 6. Nonspecific reversal of the expected cervical lordosis. 7. Grade 1 spondylolisthesis at C2-C3, C3-C4, C4-C5, C5-C6 and C6-C7. 8. Incompletely assessed cervical spondylosis. Electronically Signed   By: Rockey Childs D.O.   On: 02/04/2024 09:09   CT Maxillofacial WO CM Result Date: 02/04/2024 CLINICAL DATA:  Provided history: Head trauma, minor. Facial trauma, blunt. Additional history provided: Syncopal episode. EXAM: CT HEAD WITHOUT CONTRAST CT MAXILLOFACIAL WITHOUT CONTRAST TECHNIQUE: Multidetector CT imaging of the head and maxillofacial structures were performed using the standard protocol without intravenous contrast. Multiplanar CT image reconstructions of the maxillofacial structures were also generated. RADIATION DOSE REDUCTION: This exam was performed according to the departmental dose-optimization program which includes automated exposure control, adjustment of the mA and/or kV according to patient size and/or use of iterative reconstruction technique. COMPARISON:  Head CT 04/11/2023. FINDINGS: CT HEAD FINDINGS Brain: Generalized cerebral atrophy. Patchy and ill-defined hypoattenuation within the cerebral white matter, nonspecific but compatible with mild chronic small vessel ischemic disease. There is no acute intracranial hemorrhage. No demarcated cortical infarct. No extra-axial fluid collection. No evidence of an intracranial mass. No midline shift. Vascular: No hyperdense vessel.  Atherosclerotic calcifications. Skull: No calvarial fracture or aggressive osseous  lesion. Other: Sizable forehead and periorbital hematoma on the left. Smaller left facial hematoma. CT MAXILLOFACIAL FINDINGS Osseous: Redemonstrated chronic medially displaced lamina papyracea fractures bilaterally. Displaced left nasal bone fracture, new from the prior head CT of 04/11/2023 but otherwise age-indeterminate. Orbits: Sizable forehead and periorbital hematoma on the left. The globes are normal in size and contour. No retrobulbar hematoma. Sinuses: Trace secretions within the bilateral maxillary sinuses. Minimal mucosal thickening scattered within bilateral ethmoid air cells. Soft tissues: Sizable forehead and periorbital hematoma on the left. Other: Incompletely assessed cervical spondylosis. Nonspecific reversal of the expected cervical lordosis. Grade 1 anterolisthesis at C2-C3, C3-C4 and C4-C5. Grade 1 retrolisthesis at C5-C6 and C6-C7. IMPRESSION: CT head: 1.  No evidence of an acute intracranial abnormality. 2. Parenchymal atrophy and chronic small vessel ischemic disease. 3. Sizable forehead and periorbital hematoma on the left. CT maxillofacial: 1. Displaced left nasal bone fracture, new from the prior head CT of 04/11/2023  but otherwise age-indeterminate. 2. Redemonstrated chronic medially displaced lamina papyracea fractures bilaterally. 3. Sizable forehead and periorbital hematoma on the left. 4. Smaller left facial hematoma. 5. Minor paranasal sinus disease as described. 6. Nonspecific reversal of the expected cervical lordosis. 7. Grade 1 spondylolisthesis at C2-C3, C3-C4, C4-C5, C5-C6 and C6-C7. 8. Incompletely assessed cervical spondylosis. Electronically Signed   By: Rockey Childs D.O.   On: 02/04/2024 09:09     Procedures   Medications Ordered in the ED  sodium chloride  0.9 % bolus 1,000 mL (has no administration in time range)    Clinical Course as of 02/04/24 1627  Wed Feb 04, 2024  1022 Patient was orthostatic by blood pressure.  Was not dizzy.  Reviewed results of lab  work and CT with him.  He is comfortable plan for discharge. [MB]    Clinical Course User Index [MB] Towana Ozell BROCKS, MD                                 Medical Decision Making Amount and/or Complexity of Data Reviewed Labs: ordered. Radiology: ordered.  Risk Prescription drug management.   This patient complains of syncopal event head injury; this involves an extensive number of treatment Options and is a complaint that carries with it a high risk of complications and morbidity. The differential includes fracture, contusion, intracranial bleed, arrhythmia, anemia, dehydration  I ordered, reviewed and interpreted labs, which included CBC with a chronically low hemoglobin, chemistries with low potassium normal renal function I ordered medication IV fluids oral potassium, tetanus update and reviewed PMP when indicated. I ordered imaging studies which included head and max face CT and I independently    visualized and interpreted imaging which showed soft tissue contusion, nasal fracture question age Additional history obtained from EMS Previous records obtained and reviewed in epic including PCP and recent ED visits Cardiac monitoring reviewed, sinus rhythm Social determinants considered, social isolation Critical Interventions: None  After the interventions stated above, I reevaluated the patient and found patient to symptomatically be feeling better and ambulatory in the department without any difficulty Admission and further testing considered, no indications for admission at this time.  Recommended continued hydration and outpatient follow-up with PCP.  Return instructions discussed      Final diagnoses:  Syncope and collapse  Contusion of face, initial encounter  Closed fracture of nasal bone, initial encounter    ED Discharge Orders     None          Towana Ozell BROCKS, MD 02/04/24 [phone]

## 2024-02-06 ENCOUNTER — Ambulatory Visit: Payer: Self-pay

## 2024-02-06 NOTE — Telephone Encounter (Signed)
 FYI Only or Action Required?: FYI only for provider.  Patient was last seen in primary care on 01/23/2024 by Tobie Suzzane POUR, MD.  Called Nurse Triage reporting Fall.  Symptoms began several days ago.  Interventions attempted: Rest, hydration, or home remedies.  Symptoms are: gradually worsening.  Triage Disposition: Call EMS 911 Now  Patient/caregiver understands and will follow disposition?: No, refuses disposition      Copied from CRM [id number]. Topic: Clinical - Red Word Triage >> Feb 06, 2024  9:38 AM Jonathan Benitez: Red Word that prompted transfer to Nurse Triage: had a fall and went to ER, pt is in pain and he can't get up Reason for Disposition  [1] SEVERE weakness (e.g., unable to walk or barely able to walk, requires support) AND [2] new-onset or getting worse    Pt reports fall last week and went to the ED and discharged same day. Pt reports that his mobility has worsened since then and has been bedridden unable to cook and only gets out of bed to use the bathroom. Pt has a hx of hip replacement and has concerns that he may have re-injured his hips since his mobility is so limited. Pt does endorse living at home and his support is not local/available. [facility]ager offered to call 911 for pt, but pt declined d/t financial concerns and having personal things to address first.  Triager called PCP CAL and spoke to [place] re: 911 refusal. Triager will forward encounter for Dr Tobie 's office to review.  Answer Assessment - Initial Assessment Questions 1. MECHANISM: How did the fall happen?     Passed out and bumped my head - endorses going to ED and was discharged same day 2. DOMESTIC VIOLENCE AND ELDER ABUSE SCREENING: Did you fall because someone pushed you or tried to hurt you? If Yes, ask: Are you safe now?     *No Answer* 3. ONSET: When did the fall happen? (e.g., minutes, hours, or days ago)     Last week 4. LOCATION: What part of the body hit the ground?  (e.g., back, buttocks, head, hips, knees, hands, head, stomach)     Head 5. INJURY: Did you hurt (injure) yourself when you fell? If Yes, ask: What did you injure? Tell me more about this? (e.g., body area; type of injury; pain severity)     *No Answer* Previous hip injury 6. PAIN: Is there any pain? If Yes, ask: How bad is the pain? (e.g., Scale 0-10; or none, mild,      9/10  7. SIZE: For cuts, bruises, or swelling, ask: How large is it? (e.g., inches or centimeters)      *No Answer* 8. PREGNANCY: Is there any chance you are pregnant? When was your last menstrual period?     N/a 9. OTHER SYMPTOMS: Do you have any other symptoms? (e.g., dizziness, fever, weakness; new-onset or worsening).      *No Answer* 10. CAUSE: What do you think caused the fall (or falling)? (e.g., dizzy spell, tripped)       *No Answer*  Protocols used: [facility]

## 2024-02-09 ENCOUNTER — Inpatient Hospital Stay (HOSPITAL_COMMUNITY)
Admission: EM | Admit: 2024-02-09 | Discharge: 2024-02-12 | DRG: 536 | Disposition: A | Discharge status: Home / Self Care | Attending: Internal Medicine | Admitting: Internal Medicine

## 2024-02-09 ENCOUNTER — Encounter (HOSPITAL_COMMUNITY): Payer: Self-pay | Admitting: Emergency Medicine

## 2024-02-09 ENCOUNTER — Emergency Department (HOSPITAL_COMMUNITY)

## 2024-02-09 ENCOUNTER — Other Ambulatory Visit: Payer: Self-pay

## 2024-02-09 DIAGNOSIS — F1020 Alcohol dependence, uncomplicated: Secondary | ICD-10-CM | POA: Diagnosis present

## 2024-02-09 DIAGNOSIS — K529 Noninfective gastroenteritis and colitis, unspecified: Secondary | ICD-10-CM | POA: Diagnosis present

## 2024-02-09 DIAGNOSIS — F1011 Alcohol abuse, in remission: Secondary | ICD-10-CM | POA: Diagnosis not present

## 2024-02-09 DIAGNOSIS — D539 Nutritional anemia, unspecified: Secondary | ICD-10-CM | POA: Diagnosis present

## 2024-02-09 DIAGNOSIS — S0990XA Unspecified injury of head, initial encounter: Secondary | ICD-10-CM | POA: Diagnosis present

## 2024-02-09 DIAGNOSIS — I110 Hypertensive heart disease with heart failure: Secondary | ICD-10-CM | POA: Diagnosis present

## 2024-02-09 DIAGNOSIS — Z23 Encounter for immunization: Secondary | ICD-10-CM | POA: Diagnosis not present

## 2024-02-09 DIAGNOSIS — N401 Enlarged prostate with lower urinary tract symptoms: Secondary | ICD-10-CM | POA: Diagnosis present

## 2024-02-09 DIAGNOSIS — D509 Iron deficiency anemia, unspecified: Secondary | ICD-10-CM | POA: Diagnosis present

## 2024-02-09 DIAGNOSIS — E860 Dehydration: Secondary | ICD-10-CM | POA: Diagnosis present

## 2024-02-09 DIAGNOSIS — I429 Cardiomyopathy, unspecified: Secondary | ICD-10-CM | POA: Diagnosis present

## 2024-02-09 DIAGNOSIS — S022XXA Fracture of nasal bones, initial encounter for closed fracture: Secondary | ICD-10-CM | POA: Diagnosis present

## 2024-02-09 DIAGNOSIS — E876 Hypokalemia: Secondary | ICD-10-CM | POA: Diagnosis present

## 2024-02-09 DIAGNOSIS — K5090 Crohn's disease, unspecified, without complications: Secondary | ICD-10-CM | POA: Diagnosis present

## 2024-02-09 DIAGNOSIS — Z8249 Family history of ischemic heart disease and other diseases of the circulatory system: Secondary | ICD-10-CM

## 2024-02-09 DIAGNOSIS — F1991 Other psychoactive substance use, unspecified, in remission: Secondary | ICD-10-CM | POA: Diagnosis present

## 2024-02-09 DIAGNOSIS — K219 Gastro-esophageal reflux disease without esophagitis: Secondary | ICD-10-CM | POA: Diagnosis present

## 2024-02-09 DIAGNOSIS — S32592S Other specified fracture of left pubis, sequela: Secondary | ICD-10-CM

## 2024-02-09 DIAGNOSIS — Z823 Family history of stroke: Secondary | ICD-10-CM

## 2024-02-09 DIAGNOSIS — Z860100 Personal history of colon polyps, unspecified: Secondary | ICD-10-CM

## 2024-02-09 DIAGNOSIS — E871 Hypo-osmolality and hyponatremia: Secondary | ICD-10-CM | POA: Diagnosis present

## 2024-02-09 DIAGNOSIS — S32592A Other specified fracture of left pubis, initial encounter for closed fracture: Principal | ICD-10-CM | POA: Diagnosis present

## 2024-02-09 DIAGNOSIS — E440 Moderate protein-calorie malnutrition: Secondary | ICD-10-CM | POA: Diagnosis present

## 2024-02-09 DIAGNOSIS — Z923 Personal history of irradiation: Secondary | ICD-10-CM | POA: Diagnosis not present

## 2024-02-09 DIAGNOSIS — Y92009 Unspecified place in unspecified non-institutional (private) residence as the place of occurrence of the external cause: Secondary | ICD-10-CM | POA: Diagnosis not present

## 2024-02-09 DIAGNOSIS — E8721 Acute metabolic acidosis: Secondary | ICD-10-CM | POA: Diagnosis present

## 2024-02-09 DIAGNOSIS — I10 Essential (primary) hypertension: Secondary | ICD-10-CM

## 2024-02-09 DIAGNOSIS — Z79899 Other long term (current) drug therapy: Secondary | ICD-10-CM

## 2024-02-09 DIAGNOSIS — Z8739 Personal history of other diseases of the musculoskeletal system and connective tissue: Secondary | ICD-10-CM

## 2024-02-09 DIAGNOSIS — R52 Pain, unspecified: Secondary | ICD-10-CM | POA: Diagnosis present

## 2024-02-09 DIAGNOSIS — W19XXXA Unspecified fall, initial encounter: Secondary | ICD-10-CM | POA: Diagnosis present

## 2024-02-09 DIAGNOSIS — M109 Gout, unspecified: Secondary | ICD-10-CM | POA: Diagnosis present

## 2024-02-09 DIAGNOSIS — K551 Chronic vascular disorders of intestine: Secondary | ICD-10-CM | POA: Diagnosis present

## 2024-02-09 DIAGNOSIS — Z886 Allergy status to analgesic agent status: Secondary | ICD-10-CM

## 2024-02-09 DIAGNOSIS — Z8546 Personal history of malignant neoplasm of prostate: Secondary | ICD-10-CM | POA: Diagnosis not present

## 2024-02-09 DIAGNOSIS — I5032 Chronic diastolic (congestive) heart failure: Secondary | ICD-10-CM

## 2024-02-09 DIAGNOSIS — S3282XA Multiple fractures of pelvis without disruption of pelvic ring, initial encounter for closed fracture: Principal | ICD-10-CM

## 2024-02-09 DIAGNOSIS — Z96642 Presence of left artificial hip joint: Secondary | ICD-10-CM | POA: Diagnosis present

## 2024-02-09 DIAGNOSIS — R296 Repeated falls: Secondary | ICD-10-CM | POA: Diagnosis present

## 2024-02-09 DIAGNOSIS — Z6825 Body mass index (BMI) 25.0-25.9, adult: Secondary | ICD-10-CM

## 2024-02-09 DIAGNOSIS — R627 Adult failure to thrive: Secondary | ICD-10-CM

## 2024-02-09 DIAGNOSIS — F5101 Primary insomnia: Secondary | ICD-10-CM | POA: Diagnosis present

## 2024-02-09 DIAGNOSIS — R630 Anorexia: Secondary | ICD-10-CM | POA: Diagnosis present

## 2024-02-09 DIAGNOSIS — E782 Mixed hyperlipidemia: Secondary | ICD-10-CM | POA: Diagnosis present

## 2024-02-09 LAB — BASIC METABOLIC PANEL WITH GFR
Anion gap: 10 (ref 5–15)
BUN: 5 mg/dL — ABNORMAL LOW (ref 8–23)
CO2: 20 mmol/L — ABNORMAL LOW (ref 22–32)
Calcium: 7.7 mg/dL — ABNORMAL LOW (ref 8.9–10.3)
Chloride: 104 mmol/L (ref 98–111)
Creatinine, Ser: 0.83 mg/dL (ref 0.61–1.24)
GFR, Estimated: >60 mL/min (ref >60)
Glucose, Bld: 88 mg/dL (ref 70–99)
Potassium: 3.3 mmol/L — ABNORMAL LOW (ref 3.5–5.1)
Sodium: 134 mmol/L — ABNORMAL LOW (ref 135–145)

## 2024-02-09 LAB — IRON AND TIBC
Iron: 40 ug/dL — ABNORMAL LOW (ref 45–182)
Saturation Ratios: 15 % — ABNORMAL LOW (ref 17.9–39.5)
TIBC: 262 ug/dL (ref 250–450)
UIBC: 222 ug/dL

## 2024-02-09 LAB — VITAMIN B12: Vitamin B-12: 668 pg/mL (ref 180–914)

## 2024-02-09 LAB — CBC WITH DIFFERENTIAL/PLATELET
Abs Immature Granulocytes: 0.03 K/uL (ref 0.00–0.07)
Basophils Absolute: 0 K/uL (ref 0.0–0.1)
Basophils Relative: 1 %
Eosinophils Absolute: 0.1 K/uL (ref 0.0–0.5)
Eosinophils Relative: 2 %
HCT: 30.8 % — ABNORMAL LOW (ref 39.0–52.0)
Hemoglobin: 10.3 g/dL — ABNORMAL LOW (ref 13.0–17.0)
Immature Granulocytes: 1 %
Lymphocytes Relative: 20 %
Lymphs Abs: 1.2 K/uL (ref 0.7–4.0)
MCH: 34.7 pg — ABNORMAL HIGH (ref 26.0–34.0)
MCHC: 33.4 g/dL (ref 30.0–36.0)
MCV: 103.7 fL — ABNORMAL HIGH (ref 80.0–100.0)
Monocytes Absolute: 0.9 K/uL (ref 0.1–1.0)
Monocytes Relative: 15 %
Neutro Abs: 3.8 K/uL (ref 1.7–7.7)
Neutrophils Relative %: 61 %
Platelets: 247 K/uL (ref 150–400)
RBC: 2.97 MIL/uL — ABNORMAL LOW (ref 4.22–5.81)
RDW: 13.6 % (ref 11.5–15.5)
WBC: 6 K/uL (ref 4.0–10.5)
nRBC: 0 % (ref 0.0–0.2)

## 2024-02-09 LAB — RETICULOCYTES
Immature Retic Fract: 27.9 % — ABNORMAL HIGH (ref 2.3–15.9)
RBC.: 3.01 MIL/uL — ABNORMAL LOW (ref 4.22–5.81)
Retic Count, Absolute: 86.1 K/uL (ref 19.0–186.0)
Retic Ct Pct: 2.9 % (ref 0.4–3.1)

## 2024-02-09 LAB — FOLATE: Folate: 10.4 ng/mL (ref >5.9)

## 2024-02-09 LAB — FERRITIN: Ferritin: 82 ng/mL (ref 24–336)

## 2024-02-09 LAB — MAGNESIUM: Magnesium: 1.3 mg/dL — ABNORMAL LOW (ref 1.7–2.4)

## 2024-02-09 MED ORDER — POTASSIUM CHLORIDE IN NACL 20-0.9 MEQ/L-% IV SOLN: INTRAVENOUS | Status: DC

## 2024-02-09 MED ORDER — ACETAMINOPHEN 325 MG PO TABS
650.0000 mg | ORAL_TABLET | Freq: Four times a day (QID) | ORAL | Status: DC
  Administered 2024-02-09 – 2024-02-12 (×13): 650 mg via ORAL
  Filled 2024-02-09 (×13): qty 2

## 2024-02-09 MED ORDER — HYDROCODONE-ACETAMINOPHEN 5-325 MG PO TABS
1.0000 | ORAL_TABLET | Freq: Once | ORAL | Status: AC
  Administered 2024-02-09: 1 via ORAL
  Filled 2024-02-09: qty 1

## 2024-02-09 MED ORDER — LORAZEPAM 1 MG PO TABS: 1.0000 mg | ORAL_TABLET | ORAL | Status: AC | PRN

## 2024-02-09 MED ORDER — THIAMINE MONONITRATE 100 MG PO TABS
100.0000 mg | ORAL_TABLET | Freq: Every day | ORAL | Status: DC
  Administered 2024-02-09 – 2024-02-12 (×4): 100 mg via ORAL
  Filled 2024-02-09 (×4): qty 1

## 2024-02-09 MED ORDER — INFLUENZA VAC SPLIT HIGH-DOSE 0.5 ML IM SUSY
0.5000 mL | PREFILLED_SYRINGE | INTRAMUSCULAR | Status: AC
  Administered 2024-02-10: 0.5 mL via INTRAMUSCULAR
  Filled 2024-02-09: qty 0.5

## 2024-02-09 MED ORDER — POTASSIUM CHLORIDE CRYS ER 20 MEQ PO TBCR: 40.0000 meq | EXTENDED_RELEASE_TABLET | Freq: Once | ORAL | Status: DC

## 2024-02-09 MED ORDER — ALLOPURINOL 100 MG PO TABS
200.0000 mg | ORAL_TABLET | Freq: Every day | ORAL | Status: DC
  Administered 2024-02-09 – 2024-02-12 (×4): 200 mg via ORAL
  Filled 2024-02-09 (×4): qty 2

## 2024-02-09 MED ORDER — DOCUSATE SODIUM 100 MG PO CAPS
100.0000 mg | ORAL_CAPSULE | Freq: Two times a day (BID) | ORAL | Status: DC
  Administered 2024-02-09 – 2024-02-12 (×7): 100 mg via ORAL
  Filled 2024-02-09 (×7): qty 1

## 2024-02-09 MED ORDER — FLUTICASONE PROPIONATE 50 MCG/ACT NA SUSP: 2.0000 | Freq: Every day | NASAL | Status: DC | PRN

## 2024-02-09 MED ORDER — CARVEDILOL 3.125 MG PO TABS
6.2500 mg | ORAL_TABLET | Freq: Two times a day (BID) | ORAL | Status: DC
  Administered 2024-02-09 – 2024-02-12 (×6): 6.25 mg via ORAL
  Filled 2024-02-09 (×6): qty 2

## 2024-02-09 MED ORDER — TRIAMCINOLONE ACETONIDE 0.1 % EX OINT: 1.0000 | TOPICAL_OINTMENT | Freq: Four times a day (QID) | CUTANEOUS | Status: DC | PRN

## 2024-02-09 MED ORDER — SUCRALFATE 1 G PO TABS
1.0000 g | ORAL_TABLET | Freq: Three times a day (TID) | ORAL | Status: DC
  Administered 2024-02-09 – 2024-02-12 (×11): 1 g via ORAL
  Filled 2024-02-09 (×11): qty 1

## 2024-02-09 MED ORDER — POTASSIUM CHLORIDE CRYS ER 20 MEQ PO TBCR
20.0000 meq | EXTENDED_RELEASE_TABLET | Freq: Two times a day (BID) | ORAL | Status: DC
  Administered 2024-02-10 – 2024-02-12 (×4): 20 meq via ORAL
  Filled 2024-02-09: qty 1
  Filled 2024-02-09: qty 2
  Filled 2024-02-09 (×2): qty 1

## 2024-02-09 MED ORDER — PROSOURCE PLUS PO LIQD
30.0000 mL | Freq: Three times a day (TID) | ORAL | Status: DC
  Administered 2024-02-10 – 2024-02-12 (×8): 30 mL via ORAL
  Filled 2024-02-09 (×8): qty 30

## 2024-02-09 MED ORDER — PROSOURCE PLUS PO LIQD
30.0000 mL | Freq: Two times a day (BID) | ORAL | Status: DC
  Administered 2024-02-09: 30 mL via ORAL
  Filled 2024-02-09: qty 30

## 2024-02-09 MED ORDER — ADULT MULTIVITAMIN W/MINERALS CH
1.0000 | ORAL_TABLET | Freq: Every day | ORAL | Status: DC
  Administered 2024-02-09 – 2024-02-12 (×4): 1 via ORAL
  Filled 2024-02-09 (×4): qty 1

## 2024-02-09 MED ORDER — FOLIC ACID 1 MG PO TABS
1.0000 mg | ORAL_TABLET | Freq: Every day | ORAL | Status: DC
  Administered 2024-02-09 – 2024-02-12 (×4): 1 mg via ORAL
  Filled 2024-02-09 (×4): qty 1

## 2024-02-09 MED ORDER — POTASSIUM CHLORIDE CRYS ER 20 MEQ PO TBCR
20.0000 meq | EXTENDED_RELEASE_TABLET | Freq: Once | ORAL | Status: AC
  Administered 2024-02-09: 20 meq via ORAL
  Filled 2024-02-09: qty 1

## 2024-02-09 MED ORDER — ACETAMINOPHEN 650 MG RE SUPP
650.0000 mg | Freq: Four times a day (QID) | RECTAL | Status: DC
Start: 2024-02-09 — End: 2024-02-12

## 2024-02-09 MED ORDER — LORATADINE 10 MG PO TABS
10.0000 mg | ORAL_TABLET | Freq: Every day | ORAL | Status: DC
  Administered 2024-02-10 – 2024-02-12 (×3): 10 mg via ORAL
  Filled 2024-02-09 (×3): qty 1

## 2024-02-09 MED ORDER — ONDANSETRON HCL 4 MG/2ML IJ SOLN: 4.0000 mg | Freq: Four times a day (QID) | INTRAMUSCULAR | Status: DC | PRN

## 2024-02-09 MED ORDER — LORAZEPAM 2 MG/ML IJ SOLN: 1.0000 mg | INTRAMUSCULAR | Status: AC | PRN

## 2024-02-09 MED ORDER — FENTANYL CITRATE (PF) 100 MCG/2ML IJ SOLN: 12.5000 ug | INTRAMUSCULAR | Status: DC | PRN

## 2024-02-09 MED ORDER — MAGNESIUM OXIDE -MG SUPPLEMENT 400 (240 MG) MG PO TABS
400.0000 mg | ORAL_TABLET | Freq: Two times a day (BID) | ORAL | Status: DC
  Administered 2024-02-10 – 2024-02-12 (×5): 400 mg via ORAL
  Filled 2024-02-09 (×5): qty 1

## 2024-02-09 MED ORDER — PANTOPRAZOLE SODIUM 40 MG PO TBEC
40.0000 mg | DELAYED_RELEASE_TABLET | Freq: Every evening | ORAL | Status: DC
  Administered 2024-02-09 – 2024-02-11 (×3): 40 mg via ORAL
  Filled 2024-02-09 (×3): qty 1

## 2024-02-09 MED ORDER — TRAZODONE HCL 50 MG PO TABS: 25.0000 mg | ORAL_TABLET | Freq: Every evening | ORAL | Status: DC | PRN

## 2024-02-09 MED ORDER — OXYCODONE HCL 5 MG PO TABS
5.0000 mg | ORAL_TABLET | Freq: Four times a day (QID) | ORAL | Status: DC | PRN
  Administered 2024-02-09 – 2024-02-11 (×2): 5 mg via ORAL
  Filled 2024-02-09 (×2): qty 1

## 2024-02-09 MED ORDER — ONDANSETRON HCL 4 MG PO TABS: 4.0000 mg | ORAL_TABLET | Freq: Four times a day (QID) | ORAL | Status: DC | PRN

## 2024-02-09 MED ORDER — THIAMINE HCL 100 MG/ML IJ SOLN
100.0000 mg | Freq: Every day | INTRAMUSCULAR | Status: DC
Start: 2024-02-09 — End: 2024-02-12
  Filled 2024-02-09 (×2): qty 2

## 2024-02-09 MED ORDER — ENOXAPARIN SODIUM 40 MG/0.4ML IJ SOSY
40.0000 mg | PREFILLED_SYRINGE | INTRAMUSCULAR | Status: DC
  Administered 2024-02-10 – 2024-02-12 (×3): 40 mg via SUBCUTANEOUS
  Filled 2024-02-09 (×4): qty 0.4

## 2024-02-09 MED ORDER — TRAZODONE HCL 50 MG PO TABS
100.0000 mg | ORAL_TABLET | Freq: Every evening | ORAL | Status: DC | PRN
  Administered 2024-02-11: 100 mg via ORAL
  Filled 2024-02-09: qty 2

## 2024-02-09 MED ORDER — MAGNESIUM SULFATE 4 GM/100ML IV SOLN
4.0000 g | Freq: Once | INTRAVENOUS | Status: AC
  Administered 2024-02-09: 4 g via INTRAVENOUS
  Filled 2024-02-09: qty 100

## 2024-02-09 NOTE — ED Provider Notes (Signed)
 [place] EMERGENCY DEPARTMENT AT [facility] Provider Note   CSN: [id number] Arrival date & time: 02/09/24  9443     Patient presents with: Hip Pain   Jonathan Benitez is a 73 y.o. male.   The history is provided by the patient.  Patient with history of GERD, hypertension, previous left hip replacement presents for left hip pain since around September 25 Patient was seen in the ER on September 24 for syncopal episode where he sustained a head injury  Patient had extensive evaluation at that time and there is no acute head injury but he did have some nasal fractures Patient reports some bruising and swelling to his face and forehead but otherwise has been improving He reports while in the ER he was improved and was walking around in no distress he had no lower extremity pain However the following day, he reports he woke up with significant pain in his left hip and since that time it has worsened.  No new falls.  He reports the leg is numb from the left knee down.  It hurts to move his left hip.  He denies any back pain no abdominal pain.  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 Authorizing Provider  allopurinol  (ZYLOPRIM ) 100 MG tablet Take 200 mg by mouth daily. 04/10/22   [provider]  carvedilol  (COREG ) 6.25 MG tablet Take 6.25 mg by mouth 2 (two) times daily with a meal. 07/18/23   [provider]  cetirizine (ZYRTEC) 10 MG tablet Take 10 mg by mouth daily.    [provider]  diclofenac  Sodium (VOLTAREN ) 1 % GEL Apply 4 g topically 4 (four) times daily. 02/19/23   [provider]  esomeprazole   (NEXIUM ) 40 MG capsule Take 1 capsule (40 mg total) by mouth daily at 12 noon. 01/23/24   Tobie Suzzane POUR, MD  fluticasone  (FLONASE ) 50 MCG/ACT nasal spray Place 2 sprays into both nostrils daily as needed for allergies.     [provider]  magnesium  oxide (MAG-OX) 400 (240 Mg) MG tablet Take 1 tablet (400 mg total) by mouth 2 (two) times daily. 01/23/22   Ricky Fines, MD  potassium chloride  SA (KLOR-CON  M) 20 MEQ tablet Take 1 tablet (20 mEq total) by mouth daily. Patient taking differently: Take 10-40 mEq by mouth daily. 08/06/23   Tobie Suzzane POUR, MD  sucralfate  (CARAFATE ) 1 g tablet Take 1 tablet (1 g total) by mouth 4 (four) times daily -  with meals and at bedtime. 01/23/24   Tobie Suzzane POUR, MD  traZODone  (DESYREL ) 50 MG tablet Take 50 mg by mouth at bedtime as needed for sleep. for sleep 02/19/23   [provider]  triamcinolone ointment (KENALOG) 0.1 % Apply 1 Application topically 4 (four) times daily as needed (itching). 06/21/22   [provider]    Allergies: Nsaids    Review of Systems  Cardiovascular:  Negative for chest pain.  Gastrointestinal:  Negative for abdominal pain.  Musculoskeletal:  Positive for arthralgias. Negative for back pain.  Neurological:  Positive for  weakness and numbness.    Updated Vital Signs BP 121/84   Pulse 85   Temp 98.1 F (36.7 C) (Oral)   Resp 18   Ht 1.829 m (6')   Wt 84 kg   SpO2 98%   BMI 25.12 kg/m   Physical Exam CONSTITUTIONAL: Elderly, no distress HEAD: Bruising is noted EYES: EOMI/PERRL ENMT: Mucous membranes moist NECK: supple no meningeal signs SPINE/BACK:entire spine nontender CV: S1/S2 noted, no murmurs/rubs/gallops noted LUNGS: Lungs are clear to auscultation bilaterally, no apparent distress ABDOMEN: soft, nontender GU: There is no inguinal hernias noted NEURO: Pt is awake/alert/appropriate, No arm drift  no facial droop.   Patient can range his right lower extremity without  difficulty Patient has limitation of range of motion of the left lower extremity due to pain. There is no clonus, he has equal patellar reflexes in the lower extremities He has left plantarflexion and dorsiflexion He has appropriate great toe extension on the left Patient reports numbness to the left calf EXTREMITIES: Doppler signal found in both feet Both feet are warm to touch. There are no deformities.  There is no external rotation.  Significant tenderness is noted with range of motion of the left hip.  He also has diffuse tenderness to the left tib-fib. SKIN: warm, color normal PSYCH: no abnormalities of mood noted, alert and oriented to situation  (all labs ordered are listed, but only abnormal results are displayed) Labs Reviewed  CBC WITH DIFFERENTIAL/PLATELET  BASIC METABOLIC PANEL WITH GFR    EKG: None  Radiology: DG Tibia/Fibula Left Result Date: 02/09/2024 CLINICAL DATA:  Fall with worsening pain. EXAM: LEFT TIBIA AND FIBULA - 2 VIEW COMPARISON:  None Available. FINDINGS: Bones are diffusely demineralized. Deformity of the proximal fibular diaphysis suggests healed remote trauma. No evidence for an acute fracture. No worrisome lytic or sclerotic osseous abnormality. IMPRESSION: 1. No acute bony findings. 2. Deformity of the proximal fibular diaphysis suggests healed remote trauma. Electronically Signed   By: Camellia Candle M.D.   On: 02/09/2024 07:00   DG Hip Unilat W or Wo Pelvis 2-3 Views Left Result Date: 02/09/2024 CLINICAL DATA:  Fall.  Left-sided hip pain. EXAM: DG HIP (WITH OR WITHOUT PELVIS) 2-3V LEFT COMPARISON:  None Available. FINDINGS: Bones are demineralized. Status post left hip replacement. No evidence for dislocation or periprosthetic femur fracture. IMPRESSION: Status post left hip replacement without acute bony findings. Electronically Signed   By: Camellia Candle M.D.   On: 02/09/2024 06:59     Procedures   Medications Ordered in the ED   HYDROcodone -acetaminophen  (NORCO/VICODIN) 5-325 MG per tablet 1 tablet (1 tablet Oral Given 02/09/24 0717)    Clinical Course as of 02/09/24 0720  Mon Feb 09, 2024  0719 Pt presents with left hip pain, started a day after recent ER visit.  He has been unable to get out of bed and do ADLs.  Initial imaging negative Movement is mostly limited due to pain He has appropriate/symmetric doppler signals in both feet  He may need further imaging around hip/pelvis, though lumbosacral radiculopathy is possible  Signed out to dr freddi with workup pending [DW]    Clinical Course User Index [DW] Midge Golas, MD                                 Medical Decision Making Amount and/or Complexity of Data Reviewed Labs: ordered. Radiology: ordered.  Risk Prescription drug management.  This patient presents to the ED for concern of hip pain, this involves an extensive number of treatment options, and is a complaint that carries with it a high risk of complications and morbidity.  The differential diagnosis includes but is not limited to occult fracture, dislocation, muscle strain, septic joint, lumbosacral radiculopathy, vascular occlusion  Comorbidities that complicate the patient evaluation: Patient's presentation is complicated by their history of hypertension  Social Determinants of Health: Patient's veteran status  increases the complexity of managing their presentation  Additional history obtained: Records reviewed outpatient records reviewed  Imaging Studies ordered: I ordered imaging studies including X-ray left hip and left tib-fib  I independently visualized and interpreted imaging which showed no acute findings I agree with the radiologist interpretation  Medicines ordered and prescription drug management: I ordered medication including Vicodin for pain  Complexity of problems addressed: Patient's presentation is most consistent with  acute presentation with potential  threat to life or bodily function     Final diagnoses:  None    ED Discharge Orders     None          Midge Golas, MD 02/09/24 [phone]

## 2024-02-09 NOTE — ED Notes (Signed)
 2nd failed attempt to call report.

## 2024-02-09 NOTE — Plan of Care (Signed)
  Problem: Acute Rehab OT Goals (only OT should resolve) Goal: Pt. Will Perform Grooming Flowsheets (Taken 02/09/2024 1524) Pt Will Perform Grooming:  standing  Independently Goal: Pt. Will Perform Lower Body Bathing Flowsheets (Taken 02/09/2024 1524) Pt Will Perform Lower Body Bathing: Independently Goal: Pt. Will Perform Lower Body Dressing Flowsheets (Taken 02/09/2024 1524) Pt Will Perform Lower Body Dressing: Independently Goal: Pt. Will Transfer To Toilet Flowsheets (Taken 02/09/2024 1524) Pt Will Transfer to Toilet:  Independently  ambulating Goal: Pt. Will Perform Toileting-Clothing Manipulation Flowsheets (Taken 02/09/2024 1524) Pt Will Perform Toileting - Clothing Manipulation and hygiene:  Independently  sit to/from stand  sitting/lateral leans  Jonathan Benitez OT, MOT

## 2024-02-09 NOTE — ED Notes (Signed)
 Pt ambulated in room with 3 assist.... walker, and 2 people. Pt unwilling to ambulate more than 5 steps because of reported pain.

## 2024-02-09 NOTE — Evaluation (Signed)
 Occupational Therapy Evaluation Patient Details Name: Jonathan Benitez MRN: [id number] DOB: December 27, 1950 Today's Date: 02/09/2024   History of Present Illness   Jonathan Benitez is a 73 year old male who was living alone, past medical history of chronic alcoholism, gout, recreational substance use, hyperlipidemia, protein calorie malnutrition, history of prostate cancer, diastolic heart failure, GERD, Crohn's disease, chronic mesenteric ischemia, chronic diarrhea, poor appetite, failure to thrive, who was treated in the ED on 02/04/2024 after presenting with a syncopal episode and head injury after a fall.  He was treated for a facial soft tissue contusion of the face and found to have fracture of the nasal bone.  At the time he was able to feel better after treatment and was ambulatory without difficulty.  He reports that after going home he is increasing pain on the left sided area of his hip.  Has been getting worse.  Is affecting him being able to ambulate and take care of himself at home.  He reports that the pain has become uncontrolled and he ended up calling EMS who brought him back into the emergency department.  He received a CT scan of his pelvis with findings of acute, nondisplaced left parasymphyseal and left superior pubic  ramus fractures but no other fracture found with intact left total hip arthroplasty.  He was given treatment in ED but has been unable to get up due to uncontrolled pain.  He had findings of dehydration and  hypokalemia from poor oral intake at home unable to access food and water  properly.  Admission was requested for pain management and therapy evaluations. (per MD)     Clinical Impressions Pt agreeable to OT and PT co-evaluation. Pt is independent at baseline and reports no additional  help at home. Pt does not use a RW at baseline but today required one for safety. Mod A for stand pivot transfer to chair without RW. More CGA to min A with RW for mobility. Mod I level of  assist for bed mobility with WFL B UE functional use and A/ROM. Pt required set up assist for seated ADL's due to balance instability in standing. Pt left in the chair with call bell within reach and chair alarm set. Pt will benefit from continued OT in the hospital to increase strength, balance, and endurance for safe ADL's.        If plan is discharge home, recommend the following:   A lot of help with walking and/or transfers;A little help with bathing/dressing/bathroom;Assistance with cooking/housework;Assist for transportation;Help with stairs or ramp for entrance     Functional Status Assessment   Patient has had a recent decline in their functional status and demonstrates the ability to make significant improvements in function in a reasonable and predictable amount of time.     Equipment Recommendations   None recommended by OT             Precautions/Restrictions   Precautions Precautions: Fall Recall of Precautions/Restrictions: Intact Restrictions Weight Bearing Restrictions Per Provider Order: No     Mobility Bed Mobility Overal bed mobility: Needs Assistance Bed Mobility: Supine to Sit     Supine to sit: Modified independent (Device/Increase time)     General bed mobility comments: no physical assist needed    Transfers Overall transfer level: Needs assistance   Transfers: Sit to/from Stand, Bed to chair/wheelchair/BSC Sit to Stand: Mod assist     Step pivot transfers: Mod assist     General transfer comment: EOB to chair; very unsteady  without RW; poor standing balance      Balance Overall balance assessment: Needs assistance Sitting-balance support: No upper extremity supported, Feet supported Sitting balance-Leahy Scale: Good Sitting balance - Comments: seated at EOB   Standing balance support: During functional activity, No upper extremity supported Standing balance-Leahy Scale: Poor Standing balance comment: poor without RW ;  fiar with RW                           ADL either performed or assessed with clinical judgement   ADL Overall ADL's : Needs assistance/impaired     Grooming: Set up;Sitting       Lower Body Bathing: Set up;Sitting/lateral leans   Upper Body Dressing : Set up;Sitting   Lower Body Dressing: Set up;Sitting/lateral leans   Toilet Transfer: Contact guard assist;Minimal assistance;Ambulation;Rolling walker (2 wheels) Toilet Transfer Details (indicate cue type and reason): Based on EOB to chair without AD which was more mod A and short distance ambulation in the room with RW. Toileting- Clothing Manipulation and Hygiene: Set up;Contact guard assist;Sitting/lateral lean       Functional mobility during ADLs: Contact guard assist;Minimal assistance;Rolling walker (2 wheels) General ADL Comments: Pt able to ambulate ~6 to 8 feet forward and backward in the room with RW.     Vision Baseline Vision/History: 1 Wears glasses Ability to See in Adequate Light: 1 Impaired Patient Visual Report: No change from baseline Vision Assessment?: No apparent visual deficits     Perception Perception: Not tested       Praxis Praxis: Not tested       Pertinent Vitals/Pain Pain Assessment Pain Assessment: 0-10 Pain Score: 8  Pain Location: L hip and groin area. Pain Descriptors / Indicators: Sharp Pain Intervention(s): Limited activity within patient's tolerance, Monitored during session, Repositioned     Extremity/Trunk Assessment Upper Extremity Assessment Upper Extremity Assessment: Generalized weakness;Overall [facility] for tasks assessed   Lower Extremity Assessment Lower Extremity Assessment: Defer to PT evaluation   Cervical / Trunk Assessment Cervical / Trunk Assessment: Normal   Communication Communication Communication: No apparent difficulties   Cognition Arousal: Alert Behavior During Therapy: WFL for tasks assessed/performed Cognition: No apparent impairments                                Following commands: Intact       Cueing  General Comments   Cueing Techniques: Verbal cues;Tactile cues                 Home Living Family/patient expects to be discharged to:: Private residence Living Arrangements: Alone Available Help at Discharge: Other (Comment) (Pt reports having no  help.) Type of Home: House Home Access: Ramped entrance     Home Layout: One level     Bathroom Shower/Tub: Chief Strategy Officer: Handicapped height Bathroom Accessibility: No   Home Equipment: None (Pt is unsure if he has a RW in his building at home.)          Prior Functioning/Environment Prior Level of Function : Independent/Modified Independent;History of Falls (last six months) (2 falls in 6 months)             Mobility Comments: Community ambulator without AD. ADLs Comments: Independent    OT Problem List: Decreased strength;Decreased activity tolerance;Impaired balance (sitting and/or standing);Pain   OT Treatment/Interventions: Self-care/ADL training;Therapeutic exercise;DME and/or AE instruction;Therapeutic activities;Patient/family education;Balance training  OT Goals(Current goals can be found in the care plan section)   Acute Rehab OT Goals Patient Stated Goal: improve function OT Goal Formulation: With patient Time For Goal Achievement: 02/23/24 Potential to Achieve Goals: Good   OT Frequency:  Min 2X/week    Co-evaluation PT/OT/SLP Co-Evaluation/Treatment: Yes Reason for Co-Treatment: To address functional/ADL transfers   OT goals addressed during session: ADL's and self-care                       End of Session Equipment Utilized During Treatment: Rolling walker (2 wheels);Gait belt  Activity Tolerance: Patient tolerated treatment well Patient left: in chair;with call bell/phone within reach;with chair alarm set  OT Visit Diagnosis: Unsteadiness on feet (R26.81);Other  abnormalities of gait and mobility (R26.89);Muscle weakness (generalized) (M62.81);History of falling (Z91.81);Repeated falls (R29.6)                Time: 8671-8656 OT Time Calculation (min): 15 min Charges:  OT General Charges $OT Visit: 1 Visit OT Evaluation $OT Eval Low Complexity: 1 Low  Belvin Gauss OT, MOT  Jayson Person 02/09/2024, 3:19 PM

## 2024-02-09 NOTE — ED Provider Notes (Signed)
 Care is for to me.  CT was obtained and shows pelvis fractures.  I have personally viewed/interpret these images and agree with radiology.  Despite hydrocodone , patient is unable to take only a few steps and that required significant assistance.  He lives by himself and has been unable to feed himself due to this limited weightbearing.  While it does not appear he would need surgery and this would typically be a weightbearing as tolerated fracture, I think he will need admission for PT, pain control, etc.  Dr. Vicci will admit.   Freddi Hamilton, MD 02/09/24 [id number]

## 2024-02-09 NOTE — Evaluation (Signed)
 Physical Therapy Evaluation Patient Details Name: Jonathan Benitez MRN: [id number] DOB: 04-08-1951 Today's Date: 02/09/2024  History of Present Illness  Jonathan Benitez is a 73 year old male who was living alone, past medical history of chronic alcoholism, gout, recreational substance use, hyperlipidemia, protein calorie malnutrition, history of prostate cancer, diastolic heart failure, GERD, Crohn's disease, chronic mesenteric ischemia, chronic diarrhea, poor appetite, failure to thrive, who was treated in the ED on 02/04/2024 after presenting with a syncopal episode and head injury after a fall.  He was treated for a facial soft tissue contusion of the face and found to have fracture of the nasal bone.  At the time he was able to feel better after treatment and was ambulatory without difficulty.  He reports that after going home he is increasing pain on the left sided area of his hip.  Has been getting worse.  Is affecting him being able to ambulate and take care of himself at home.  He reports that the pain has become uncontrolled and he ended up calling EMS who brought him back into the emergency department.  He received a CT scan of his pelvis with findings of acute, nondisplaced left parasymphyseal and left superior pubic  ramus fractures but no other fracture found with intact left total hip arthroplasty.  He was given treatment in ED but has been unable to get up due to uncontrolled pain.  He had findings of dehydration and  hypokalemia from poor oral intake at home unable to access food and water  properly.  Admission was requested for pain management and therapy evaluations.   Clinical Impression  Pt was agreeable to today's PT and OT co-evaluations. Pt was independent with all functional mobility prior to hospitalization. However, when attempting to ambulate without an AD, he required modA for safety due to difficulty weight shifting onto his LLE. This was able to be improved with CGA-minA when  ambulating with a RW. Pt was left in the chair with the call bell within reach and the chair alarm set. Patient will benefit from continued skilled physical therapy in hospital and recommended venue below to increase strength, balance, endurance for safe ADLs and gait.       If plan is discharge home, recommend the following: A lot of help with walking and/or transfers;A little help with bathing/dressing/bathroom;Assistance with cooking/housework;Assist for transportation;Help with stairs or ramp for entrance   Can travel by private vehicle   Yes    Equipment Recommendations None recommended by PT  Recommendations for Other Services       Functional Status Assessment Patient has had a recent decline in their functional status and demonstrates the ability to make significant improvements in function in a reasonable and predictable amount of time.     Precautions / Restrictions Precautions Precautions: Fall Recall of Precautions/Restrictions: Intact Restrictions Weight Bearing Restrictions Per Provider Order: No      Mobility  Bed Mobility Overal bed mobility: Needs Assistance Bed Mobility: Supine to Sit     Supine to sit: Modified independent (Device/Increase time)     General bed mobility comments: no physical assist needed    Transfers Overall transfer level: Needs assistance   Transfers: Sit to/from Stand, Bed to chair/wheelchair/BSC Sit to Stand: Mod assist   Step pivot transfers: Mod assist       General transfer comment: EOB to chair; very unsteady without RW; poor standing balance    Ambulation/Gait Ambulation/Gait assistance: Mod assist, Min assist (modA w/o RW; minA w/ RW) Gait Distance (  Feet): 6 Feet Assistive device: Rolling walker (2 wheels) Gait Pattern/deviations: Step-to pattern, Decreased step length - right, Decreased stance time - left, Decreased stride length, Decreased weight shift to left Gait velocity: slow     General Gait Details: very  unsteady when attempting to ambulate without AD  Stairs            Wheelchair Mobility     Tilt Bed    Modified Rankin (Stroke Patients Only)       Balance Overall balance assessment: Needs assistance Sitting-balance support: No upper extremity supported, Feet supported Sitting balance-Leahy Scale: Good Sitting balance - Comments: seated at EOB   Standing balance support: During functional activity, No upper extremity supported Standing balance-Leahy Scale: Poor Standing balance comment: poor without RW ; fiar with RW                             Pertinent Vitals/Pain Pain Assessment Pain Assessment: 0-10 Pain Score: 8  Pain Location: L hip and groin area. Pain Descriptors / Indicators: Sharp Pain Intervention(s): Limited activity within patient's tolerance, Monitored during session, Repositioned    Home Living Family/patient expects to be discharged to:: Private residence Living Arrangements: Alone Available Help at Discharge: Other (Comment) (no help available) Type of Home: House Home Access: Ramped entrance       Home Layout: One level Home Equipment: None (pt may have a RW, but he is unsure)      Prior Function Prior Level of Function : Independent/Modified Independent;History of Falls (last six months) (2 falls in the past 6 months)             Mobility Comments: Community ambulator without AD. ADLs Comments: Independent     Extremity/Trunk Assessment   Upper Extremity Assessment Upper Extremity Assessment: Defer to OT evaluation    Lower Extremity Assessment Lower Extremity Assessment: Generalized weakness    Cervical / Trunk Assessment Cervical / Trunk Assessment: Normal  Communication   Communication Communication: No apparent difficulties    Cognition Arousal: Alert Behavior During Therapy: WFL for tasks assessed/performed   PT - Cognitive impairments: No apparent impairments                          Following commands: Intact       Cueing Cueing Techniques: Verbal cues, Tactile cues     General Comments      Exercises     Assessment/Plan    PT Assessment Patient needs continued PT services  PT Problem List Decreased strength;Decreased activity tolerance;Decreased balance;Decreased mobility;Pain       PT Treatment Interventions DME instruction;Gait training;Stair training;Functional mobility training;Therapeutic activities;Therapeutic exercise;Balance training;Patient/family education    PT Goals (Current goals can be found in the Care Plan section)  Acute Rehab PT Goals Patient Stated Goal: pt would like to return home. PT Goal Formulation: With patient Time For Goal Achievement: 02/16/24 Potential to Achieve Goals: Good    Frequency Min 3X/week     Co-evaluation PT/OT/SLP Co-Evaluation/Treatment: Yes Reason for Co-Treatment: To address functional/ADL transfers PT goals addressed during session: Mobility/safety with mobility;Balance OT goals addressed during session: ADL's and self-care       AM-PAC PT 6 Clicks Mobility  Outcome Measure Help needed turning from your back to your side while in a flat bed without using bedrails?: None Help needed moving from lying on your back to sitting on the side of a flat bed without  using bedrails?: None Help needed moving to and from a bed to a chair (including a wheelchair)?: A Lot Help needed standing up from a chair using your arms (e.g., wheelchair or bedside chair)?: A Lot Help needed to walk in hospital room?: A Lot Help needed climbing 3-5 steps with a railing? : Total 6 Click Score: 15    End of Session Equipment Utilized During Treatment: Gait belt Activity Tolerance: Patient tolerated treatment well Patient left: in chair;with call bell/phone within reach;with chair alarm set   PT Visit Diagnosis: Other abnormalities of gait and mobility (R26.89);Muscle weakness (generalized) (M62.81);History of falling  (Z91.81)    Time: 8672-8659 PT Time Calculation (min) (ACUTE ONLY): 13 min   Charges:   PT Evaluation $PT Eval Low Complexity: 1 Low PT Treatments $Therapeutic Activity: 8-22 mins PT General Charges $$ ACUTE PT VISIT: 1 Visit        Lacinda Fass, PT, DPT  02/09/2024, 3:27 PM

## 2024-02-09 NOTE — ED Triage Notes (Signed)
 Pt bib [facility] for left sided hip pain from a fall last week. Pt states he has already been seen for the fall but the pain has progressively gotten worse.

## 2024-02-09 NOTE — ED Notes (Signed)
 ED Provider at bedside.

## 2024-02-09 NOTE — H&P (Addendum)
 History and Physical  [facility]  Jonathan Benitez FMW:[id number] DOB: 12-15-1950 DOA: 02/09/2024  PCP: Tobie Suzzane POUR, MD  Patient coming from: Home by EMS (lives alone) Level of care: Med-Surg  I have personally briefly reviewed patient's old medical records in [facility]  Chief Complaint: uncontrolled pain   HPI: Jonathan Benitez is a 73 year old male who was living alone, past medical history of chronic alcoholism, gout, recreational substance use, hyperlipidemia, protein calorie malnutrition, history of prostate cancer, diastolic heart failure, GERD, Crohn's disease, chronic mesenteric ischemia, chronic diarrhea, poor appetite, failure to thrive, who was treated in the ED on 02/04/2024 after presenting with a syncopal episode and head injury after a fall.  He was treated for a facial soft tissue contusion of the face and found to have fracture of the nasal bone.  At the time he was able to feel better after treatment and was ambulatory without difficulty.  He reports that after going home he is increasing pain on the left sided area of his hip.  Has been getting worse.  Is affecting him being able to ambulate and take care of himself at home.  He reports that the pain has become uncontrolled and he ended up calling EMS who brought him back into the emergency department.  He received a CT scan of his pelvis with findings of acute, nondisplaced left parasymphyseal and left superior pubic ramus fractures but no other fracture found with intact left total hip arthroplasty.  He was given treatment in ED but has been unable to get up due to uncontrolled pain.  He had findings of dehydration and  hypokalemia from poor oral intake at home unable to access food and water  properly.  Admission was requested for pain management and therapy evaluations.      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    Past Surgical History:  Procedure Laterality Date   COLONOSCOPY N/A 03/30/2018   Procedure: COLONOSCOPY;  Surgeon: Golda Claudis PENNER, MD;  [facility];  Service: Endoscopy;  Laterality: N/A;  12:45   COLONOSCOPY WITH PROPOFOL  N/A 01/23/2022   Procedure: COLONOSCOPY WITH PROPOFOL ;  Surgeon: Cindie Carlin POUR, DO;  Location: [facility];  Service: Endoscopy;  Laterality: N/A;   HERNIA REPAIR     [place]   LEFT HEART CATH AND CORONARY ANGIOGRAPHY N/A 04/09/2023   Procedure: LEFT HEART CATH AND CORONARY ANGIOGRAPHY;  Surgeon: [place], Peter M, MD;  Location: [facility] INVASIVE CV LAB;  Service: Cardiovascular;  Laterality: N/A;   POLYPECTOMY  03/30/2018   Procedure: POLYPECTOMY;  Surgeon: Golda Claudis PENNER, MD;  Location: [facility];  Service: Endoscopy;;  colon   PROSTATE BIOPSY     positive for cancer   TOTAL HIP ARTHROPLASTY Left    [facility] 11/07/17 Dr. Jayson Crome     reports that he has never smoked. He has never used smokeless tobacco. He reports current alcohol use. He reports current drug use. Drug: Marijuana.  Allergies  Allergen Reactions   Nsaids Other (See Comments)     Anemia, Gastrointestinal hemorrhage    Family History  Problem Relation Age of Onset   Hypertension Mother    Coronary artery disease Mother    Hypertension Father    Stroke Father    Colon  cancer Neg Hx    Colon polyps Neg Hx     Prior to Admission medications   Medication Sig Start Date End Date Taking? Authorizing Provider  allopurinol  (ZYLOPRIM ) 100 MG tablet Take 200 mg by mouth daily. 04/10/22   [provider]  carvedilol  (COREG ) 6.25 MG tablet Take 6.25 mg by mouth 2 (two) times daily with a meal. 07/18/23   [provider]  cetirizine (ZYRTEC) 10 MG tablet Take 10 mg by mouth daily.    [provider]  diclofenac  Sodium (VOLTAREN ) 1 % GEL Apply 4 g topically 4 (four) times daily. 02/19/23    [provider]  esomeprazole  (NEXIUM ) 40 MG capsule Take 1 capsule (40 mg total) by mouth daily at 12 noon. 01/23/24   Tobie Suzzane POUR, MD  fluticasone  (FLONASE ) 50 MCG/ACT nasal spray Place 2 sprays into both nostrils daily as needed for allergies.     [provider]  magnesium  oxide (MAG-OX) 400 (240 Mg) MG tablet Take 1 tablet (400 mg total) by mouth 2 (two) times daily. 01/23/22   Ricky Fines, MD  potassium chloride  SA (KLOR-CON  M) 20 MEQ tablet Take 1 tablet (20 mEq total) by mouth daily. Patient taking differently: Take 10-40 mEq by mouth daily. 08/06/23   Tobie Suzzane POUR, MD  sucralfate  (CARAFATE ) 1 g tablet Take 1 tablet (1 g total) by mouth 4 (four) times daily -  with meals and at bedtime. 01/23/24   Tobie Suzzane POUR, MD  traZODone  (DESYREL ) 50 MG tablet Take 50 mg by mouth at bedtime as needed for sleep. for sleep 02/19/23   [provider]  triamcinolone ointment (KENALOG) 0.1 % Apply 1 Application topically 4 (four) times daily as needed (itching). 06/21/22   [provider]    Physical Exam: Vitals:   02/09/24 0603 02/09/24 0603 02/09/24 0604 02/09/24 0630  BP:  111/80 111/80 121/84  Pulse:  90 90 85  Resp:  18    Temp: 98.1 F (36.7 C)     TempSrc: Oral     SpO2:  98% 98% 98%  Weight:      Height:       Constitutional: lying supine in bed, NAD, calm, uncomfortable Eyes: PERRL, lids and conjunctivae normal ENMT: Mucous membranes are dry. Posterior pharynx clear of any exudate or lesions. poor dentition.  Neck: normal, supple, no masses, no thyromegaly Respiratory: clear to auscultation bilaterally, no wheezing, no crackles. Normal respiratory effort. No accessory muscle use.  Cardiovascular: normal s1, s2 sounds, no murmurs / rubs / gallops. No extremity edema. 2+ pedal pulses. No carotid bruits.  Abdomen: no tenderness, no masses palpated. No hepatosplenomegaly. Bowel sounds positive.  Musculoskeletal: no clubbing / cyanosis. Painful left  hip with palpation and range of motion limited due to pain.  no contractures. Normal muscle tone.  Skin: no rashes, lesions, ulcers. No induration Neurologic: CN 2-12 grossly intact. Sensation intact, DTR normal. Strength 5/5 in all 4.  Psychiatric: Normal judgment and insight. Alert and oriented x 3. Normal mood.   Labs on Admission: I have personally reviewed following labs and imaging studies  CBC: Recent Labs  Lab 02/04/24 0805 02/09/24 0743  WBC 5.4 6.0  NEUTROABS 2.5 3.8  HGB 10.7* 10.3*  HCT 31.2* 30.8*  MCV 100.6* 103.7*  PLT 186 247   Basic Metabolic Panel: Recent Labs  Lab 02/04/24 0805 02/09/24 0743  NA 134* 134*  K 3.2* 3.3*  CL 96* 104  CO2 26 20*  GLUCOSE 89 88  BUN <5* 5*  CREATININE 0.83 0.83  CALCIUM  8.7* 7.7*   GFR: Estimated Creatinine Clearance: 85.7 mL/min (by C-G formula based on SCr of 0.83 mg/dL). Liver Function Tests: No results for input(s): AST, ALT, ALKPHOS, BILITOT, PROT, ALBUMIN in the last 168 hours. No results for input(s): LIPASE, AMYLASE in the last 168 hours. No results for input(s): AMMONIA in the last 168 hours. Coagulation Profile: No results for input(s): INR, PROTIME in the last 168 hours. Cardiac Enzymes: No results for input(s): CKTOTAL, CKMB, CKMBINDEX, TROPONINI in the last 168 hours. BNP (last 3 results) No results for input(s): PROBNP in the last 8760 hours. HbA1C: No results for input(s): HGBA1C in the last 72 hours. CBG: Recent Labs  Lab 02/04/24 0803  GLUCAP 105*   Lipid Profile: No results for input(s): CHOL, HDL, LDLCALC, TRIG, CHOLHDL, LDLDIRECT in the last 72 hours. Thyroid Function Tests: No results for input(s): TSH, T4TOTAL, FREET4, T3FREE, THYROIDAB in the last 72 hours. Anemia Panel: No results for input(s): VITAMINB12, FOLATE, FERRITIN, TIBC, IRON, RETICCTPCT in the last 72 hours. Urine analysis:    Component Value Date/Time    COLORURINE AMBER (A) 04/09/2023 0402   APPEARANCEUR HAZY (A) 04/09/2023 0402   LABSPEC 1.016 04/09/2023 0402   PHURINE 5.0 04/09/2023 0402   GLUCOSEU >=500 (A) 04/09/2023 0402   HGBUR NEGATIVE 04/09/2023 0402   BILIRUBINUR NEGATIVE 04/09/2023 0402   KETONESUR 5 (A) 04/09/2023 0402   PROTEINUR NEGATIVE 04/09/2023 0402   UROBILINOGEN 0.2 12/17/2013 0825   NITRITE NEGATIVE 04/09/2023 0402   LEUKOCYTESUR NEGATIVE 04/09/2023 0402    Radiological Exams on Admission: CT PELVIS WO CONTRAST Result Date: 02/09/2024 CLINICAL DATA:  Pelvic fracture. Continued left hip pain after falling 5 days ago. EXAM: CT PELVIS WITHOUT CONTRAST TECHNIQUE: Multidetector CT imaging of the pelvis was performed following the standard protocol without intravenous contrast. RADIATION DOSE REDUCTION: This exam was performed according to the departmental dose-optimization program which includes automated exposure control, adjustment of the mA and/or kV according to patient size and/or use of iterative reconstruction technique. COMPARISON:  Left hip radiograph 02/09/2024.  Pelvic CTA 11/19/2023. FINDINGS: Urinary Tract: The visualized distal ureters appear unremarkable. Possible mild bladder wall thickening versus incomplete distension. The bladder is partially obscured by artifact from the left total hip arthroplasty. Bowel: No bowel wall thickening, distention or surrounding inflammation identified within the pelvis. Mild distal colonic diverticulosis. Contrast within the lumen of the appendix. No appendiceal distention or surrounding inflammation. Vascular/Lymphatic: No enlarged pelvic lymph nodes identified. Diffuse iliofemoral atherosclerosis without evidence of aneurysm. Reproductive: The prostate gland and seminal vesicles appear unremarkable. Other: Probable left inguinal herniorrhaphy changes. No evidence of pelvic ascites or pneumoperitoneum. Musculoskeletal: Status post left total hip arthroplasty. The hardware is intact  without evidence of loosening. There is no evidence of acute proximal femur fracture, dislocation or right femoral head osteonecrosis. There are acute, nondisplaced left parasymphyseal and left superior pubic ramus fractures. No other acute pelvic fractures are demonstrated. The sacroiliac joints and symphysis pubis are intact. Chronic degenerative disc disease noted at the lumbosacral junction. No evidence of significant pelvic hematoma. Underlying left pelvic muscular atrophy, likely chronic. IMPRESSION: 1. Acute, nondisplaced left parasymphyseal and left superior pubic ramus fractures. 2. No other acute pelvic fractures identified. Intact left total hip arthroplasty. 3.  Aortic Atherosclerosis (ICD10-I70.0). Electronically Signed   By: Elsie Perone M.D.   On: 02/09/2024 08:57   DG Tibia/Fibula Left Result Date: 02/09/2024 CLINICAL DATA:  Fall with worsening pain. EXAM: LEFT TIBIA AND FIBULA -  2 VIEW COMPARISON:  None Available. FINDINGS: Bones are diffusely demineralized. Deformity of the proximal fibular diaphysis suggests healed remote trauma. No evidence for an acute fracture. No worrisome lytic or sclerotic osseous abnormality. IMPRESSION: 1. No acute bony findings. 2. Deformity of the proximal fibular diaphysis suggests healed remote trauma. Electronically Signed   By: Camellia Candle M.D.   On: 02/09/2024 07:00   DG Hip Unilat W or Wo Pelvis 2-3 Views Left Result Date: 02/09/2024 CLINICAL DATA:  Fall.  Left-sided hip pain. EXAM: DG HIP (WITH OR WITHOUT PELVIS) 2-3V LEFT COMPARISON:  None Available. FINDINGS: Bones are demineralized. Status post left hip replacement. No evidence for dislocation or periprosthetic femur fracture. IMPRESSION: Status post left hip replacement without acute bony findings. Electronically Signed   By: Camellia Candle M.D.   On: 02/09/2024 06:59   EKG: Independently reviewed.   Assessment/Plan Principal Problem:   Uncontrolled pain Active Problems:   Inferior pubic  ramus fracture, left, sequela   HTN (hypertension)   Crohn's disease (HCC)   Gastroesophageal reflux disease   Chronic diarrhea   Moderate protein-calorie malnutrition   Primary insomnia   Failure to thrive in adult   Cardiomyopathy (HCC)   Decreased appetite   Benign prostatic hyperplasia with nocturia   Mixed hyperlipidemia   History of recreational drug use   History of alcohol abuse   History of prostate cancer   History of gout   Hypokalemia   Chronic mesenteric ischemia   Chronic diastolic heart failure (HCC)   Hyponatremia    Acute left pubic ramus fractures -- from recent falls at home  -- Fortunately he is having increasing pain and immobility -- Admitting for pain management and therapy evaluation -- PT/OT evaluation requested -- fall precautions  Uncontrolled pain  -- start scheduled acetaminophen  around the clock -- oral oxycodone  as needed for moderate pain -- IV fentanyl  for severe pain and prior to therapy sessions  Chronic diastolic heart failure -- appears dry and will gently hydrate   Dehydration  -- IV fluids ordered  Hyponatremia -- secondary to dehydration -- treating with IV fluids -- follow BMP   Hypokalemia -- check Mg -- added potassium to IV fluids  Chronic alcoholism -- Patient reporting he is drinking 2 beers per day -- Added CIWA protocol to avoid acute alcohol withdrawal  Polysubstance abuse -- Patient noted recent use of marijuana -- Check urine toxicology screen  Adult Failure to thrive -- PT/OT/TOC evaluation requested  GERD -- pantoprazole  ordered for GI protection   DVT prophylaxis: enoxaparin   Code Status: Full   Family Communication:   Disposition Plan: anticipating SNF   Consults called: PT/OT/TOC  Admission status: INP Time spent: 63 mins   Level of care: Med-Surg Afton Louder MD [facility] How to contact the [facility] Attending or Consulting provider 7A - 7P or covering provider during after hours  7P -7A, for this patient?  Check the care team in [facility] and look for a) attending/consulting [facility] provider listed and b) the [facility] team listed Log into [email or web address] and use [place]'s universal password to access. If you do not have the password, please contact the hospital operator. Locate the [facility] provider you are looking for under [facility] and page to a number that you can be directly reached. If you still have difficulty reaching the provider, please page the [facility] (Director on Call) for the Hospitalists listed on amion for assistance.   If 7PM-7AM, please contact night-coverage [email or web address] Password [email or web address]  02/09/2024,  10:54 AM

## 2024-02-09 NOTE — Plan of Care (Signed)
  Problem: Acute Rehab PT Goals(only PT should resolve) Goal: Patient Will Transfer Sit To/From Stand 02/09/2024 1532 by Elspeth Lacinda BROCKS, PT Outcome: Progressing Flowsheets (Taken 02/09/2024 1532) Patient will transfer sit to/from stand: with modified independence 02/09/2024 1532 by Elspeth Lacinda BROCKS, PT Outcome: Progressing Goal: Pt Will Transfer Bed To Chair/Chair To Bed 02/09/2024 1532 by Elspeth Lacinda BROCKS, PT Outcome: Progressing Flowsheets (Taken 02/09/2024 1532) Pt will Transfer Bed to Chair/Chair to Bed: with modified independence 02/09/2024 1532 by Elspeth Lacinda BROCKS, PT Outcome: Progressing Goal: Pt Will Ambulate 02/09/2024 1532 by Elspeth Lacinda BROCKS, PT Outcome: Progressing Flowsheets (Taken 02/09/2024 1532) Pt will Ambulate:  25 feet  with rolling walker  with contact guard assist 02/09/2024 1532 by Elspeth Lacinda BROCKS, PT Outcome: Progressing  Lacinda Elspeth, PT, DPT

## 2024-02-09 NOTE — ED Notes (Signed)
Failed attempt to call report.

## 2024-02-09 NOTE — TOC Initial Note (Addendum)
 Transition of Care [facility]) - Initial/Assessment Note    Patient Details  Name: Jonathan Benitez MRN: [id number] Date of Birth: [birth year]  Transition of Care [facility]) CM/SW Contact:    Lucie Lunger, LCSWA Phone Number: 02/09/2024, 3:52 PM  Clinical Narrative:                 CSW notes that PT is recommending SNF for pt at D/C. CSW met with pt at bedside to review recommendations. Pt states that he lives alone and feels that he can manage safely returning home. CSW spoke with pt about SNF, he states at this time he is not agreeable to a referral being sent out. CSW spoke with pt about [facility] PT/OT being arranged, he is agreeable and has no agency preference. CSW spoke to [place] with [facility] who accepts [facility] referral. CSW requested [facility] orders. Pt states he feels he will do best with a rolling walker at home and does not have one, he is agreeable to CSW ordering this, no agency preference. CSW requested that [facility] DME orders, once orders are in CSW to send referral to [place] with [facility] for review.   [facility].   TOC to follow.   Expected Discharge Plan: Home w Home Health Services Barriers to Discharge: Continued Medical Work up   Patient Goals and CMS Choice Patient states their goals for this hospitalization and ongoing recovery are:: return home CMS Medicare.gov Compare Post Acute Care list provided to:: Patient Choice offered to / list presented to : Patient      Expected Discharge Plan and Services In-house Referral: Clinical Social Work Discharge Planning Services: CM Consult Post Acute Care Choice: Home Health Living arrangements for the past 2 months: Single Family Home                 DME Arranged: Walker rolling DME Agency: [facility]       HH Arranged: PT, OT          Prior Living Arrangements/Services Living arrangements for the past 2 months: Single Family Home Lives with:: Self Patient language and need for interpreter  reviewed:: Yes Do you feel safe going back to the place where you live?: Yes      Need for Family Participation in Patient Care: Yes (Comment) Care giver support system in place?: Yes (comment)   Criminal Activity/Legal Involvement Pertinent to Current Situation/Hospitalization: No - Comment as needed  Activities of Daily Living   ADL Screening (condition at time of admission) Independently performs ADLs?: No Does the patient have a NEW difficulty with bathing/dressing/toileting/self-feeding that is expected to last >3 days?: Yes (Initiates electronic notice to provider for possible OT consult) Does the patient have a NEW difficulty with getting in/out of bed, walking, or climbing stairs that is expected to last >3 days?: Yes (Initiates electronic notice to provider for possible PT consult) Does the patient have a NEW difficulty with communication that is expected to last >3 days?: No Is the patient deaf or have difficulty hearing?: No Does the patient have difficulty seeing, even when wearing glasses/contacts?: No Does the patient have difficulty concentrating, remembering, or making decisions?: No  Permission Sought/Granted                  Emotional Assessment Appearance:: Appears stated age Attitude/Demeanor/Rapport: Engaged Affect (typically observed): Accepting Orientation: : Oriented to Self, Oriented to  Time, Oriented to Situation, Oriented to Place Alcohol / Substance Use: Not Applicable Psych  Involvement: No (comment)  Admission diagnosis:  Uncontrolled pain [R52] Multiple closed fractures of pelvis without disruption of pelvic ring, initial encounter (HCC) [S32.82XA] Patient Active Problem List   Diagnosis Date Noted   Uncontrolled pain 02/09/2024   Inferior pubic ramus fracture, left, sequela 02/09/2024   Chronic diastolic heart failure (HCC) 02/09/2024   Hyponatremia 02/09/2024   Chronic mesenteric ischemia 01/23/2024   Hypokalemia 08/06/2023   NSTEMI (non-ST  elevated myocardial infarction) 04/08/2023   History of recreational drug use 04/08/2023   History of alcohol abuse 04/08/2023   History of prostate cancer 04/08/2023   History of gout 04/08/2023   HFrEF (heart failure with reduced ejection fraction) 04/08/2023   Acute gout of left foot 01/31/2023   Benign prostatic hyperplasia with nocturia 01/31/2023   Mixed hyperlipidemia 01/31/2023   CAP (community acquired pneumonia) 11/08/2022   Acute mucoid otitis media of right ear 03/14/2022   Decreased appetite 02/27/2022   Superior mesenteric artery stenosis 01/30/2022   Hospital discharge follow-up 01/30/2022   Proctitis 01/21/2022   Cardiomyopathy (HCC) 01/20/2022   AKI (acute kidney injury) 01/19/2022   Failure to thrive in adult 01/19/2022   Marijuana abuse 01/19/2022   Elevated troponin 01/19/2022   Acute idiopathic gout of left ankle 05/01/2021   Iron deficiency anemia due to chronic blood loss 02/14/2021   Neck pain 02/14/2021   Bunion of great toe of right foot 02/14/2021   Primary insomnia 09/07/2020   Chronic diarrhea 05/19/2020   Arthritis of knee 05/19/2020   Moderate protein-calorie malnutrition 05/19/2020   Cyclic vomiting syndrome 05/19/2020   Rectal bleeding 03/25/2018   Avascular necrosis of hip, left (HCC) 11/12/2017   Gastroesophageal reflux disease 12/19/2016   Anemia 06/28/2012   Prostate cancer (HCC) 06/28/2012   HTN (hypertension) 06/28/2012   Crohn's disease (HCC) 06/28/2012   Alcoholism (HCC) 06/28/2012   PCP:  Tobie Suzzane POUR, MD Pharmacy:   [facility] - [place], [place] - [address] [place] [address] Phone: [phone] Fax: [phone]  [facility] [place], [place] - [address] [place] [address] Phone: [phone] Fax: [phone]     Social Drivers of Health (SDOH) Social History: SDOH Screenings   Food Insecurity: No Food Insecurity (02/09/2024)  Housing: Low Risk   (02/09/2024)  Transportation Needs: No Transportation Needs (02/09/2024)  Utilities: Not At Risk (02/09/2024)  Depression (PHQ2-9): Low Risk  (08/05/2023)  Financial Resource Strain: Medium Risk (03/26/2021)  Physical Activity: Insufficiently Active (03/26/2021)  Social Connections: Socially Isolated (02/09/2024)  Stress: No Stress Concern Present (03/26/2021)  Tobacco Use: Low Risk  (02/09/2024)   SDOH Interventions:     Readmission Risk Interventions    02/09/2024    3:50 PM 11/08/2022    9:32 AM  Readmission Risk Prevention Plan  Transportation Screening Complete Complete  HRI or Home Care Consult Complete Complete  Social Work Consult for Recovery Care Planning/Counseling Complete Complete  Palliative Care Screening Not Applicable Not Applicable  Medication Review Oceanographer) Complete Complete

## 2024-02-09 NOTE — Progress Notes (Signed)
 Initial Nutrition Assessment  DOCUMENTATION CODES:   Not applicable  INTERVENTION:   -Continue regular diet -30 ml Prosource Plus TID, each supplement provides 100 kcals and 15 grams protein -Continue 1 mg folic acid  daily -Continue MVI with minerals daily  -Continue 100 mg thiamine  daily -Magic cup TID with meals, each supplement provides 290 kcal and 9 grams of protein   NUTRITION DIAGNOSIS:   Increased nutrient needs related to chronic illness (CHF) as evidenced by estimated needs.  GOAL:   Patient will meet greater than or equal to 90% of their needs  MONITOR:   PO intake, Supplement acceptance  REASON FOR ASSESSMENT:   Malnutrition Screening Tool    ASSESSMENT:   Pt with past medical history of chronic alcoholism, gout, recreational substance use, hyperlipidemia, protein calorie malnutrition, history of prostate cancer, diastolic heart failure, GERD, Crohn's disease, chronic mesenteric ischemia, chronic diarrhea, poor appetite, failure to thrive, who was treated on 02/04/2024 after presenting with a syncopal episode and head injury after a fall.  He was treated for a facial soft tissue contusion of the face and found to have fracture of the nasal bone.  At the time he was able to feel better after treatment and was ambulatory without difficulty.  He reports that after going home he is increasing pain on the left sided area of his hip.  Has been getting worse.  Is affecting him being able to ambulate and take care of himself at home.  He reports that the pain has become uncontrolled and he ended up calling EMS who brought him back into the emergency department.  He received a CT scan of his pelvis with findings of acute, nondisplaced left parasymphyseal and left superior pubic  ramus fractures but no other fracture found with intact left total hip arthroplasty.  He was given treatment in ED but has been unable to get up due to uncontrolled pain.  He had findings of dehydration  and  hypokalemia from poor oral intake at home unable to access food and water  properly.  Pt admitted with uncontrolled pain and acute lt pubic ramus fractures.   Reviewed I/O's: +110 ml x 24 hours  Pt unavailable at time of visit. Attempted to speak with pt via call to hospital room phone, however, unable to reach. RD unable to obtain further nutrition-related history or complete nutrition-focused physical exam at this time.    Case discussed with RN, who reports that pt with no appetite, but states he will force himself to eat. He refuses offer of Ensure supplements, stating I don't want to be in the bathroom all day.   Reviewed wt hx; pt has experienced a 5.1% wt loss over the past year, which is not significant for time frame.   Pt awaiting PT and OT evaluations.   Medications reviewed and include colace, lovenox, folic acid , thiamine , and 0.9% NaCl with KCl 20 mEq/L infusion @ 40 ml/hr.   Labs reviewed: Na: 134, K: 3.3 (on IV supplementation).    Diet Order:   Diet Order             Diet regular Fluid consistency: Thin  Diet effective now                   EDUCATION NEEDS:   No education needs have been identified at this time  Skin:  Skin Assessment: Reviewed RN Assessment  Last BM:  02/09/24  Height:   Ht Readings from Last 1 Encounters:  02/09/24 6' (1.829 m)  Weight:   Wt Readings from Last 1 Encounters:  02/09/24 84 kg    Ideal Body Weight:  80.9 kg  BMI:  Body mass index is 25.12 kg/m.  Estimated Nutritional Needs:   Kcal:  2200-2400  Protein:  120-135 grams  Fluid:  2.0-2.2 L    Margery ORN, RD, LDN, CDCES Registered Dietitian III Certified Diabetes Care and Education Specialist If unable to reach this RD, please use RD Inpatient group chat on secure chat between hours of 8am-4 pm daily

## 2024-02-09 NOTE — Hospital Course (Signed)
 73 year old male who was living alone, past medical history of chronic alcoholism, gout, recreational substance use, hyperlipidemia, protein calorie malnutrition, history of prostate cancer, diastolic heart failure, GERD, Crohn's disease, chronic mesenteric ischemia, chronic diarrhea, poor appetite, failure to thrive, who was treated in the ED on 02/04/2024 after presenting with a syncopal episode and head injury after a fall.  He was treated for a facial soft tissue contusion of the face and found to have fracture of the nasal bone.  At the time he was able to feel better after treatment and was ambulatory without difficulty.  He reports that after going home he is increasing pain on the left sided area of his hip.  Has been getting worse.  Is affecting him being able to ambulate and take care of himself at home.  He reports that the pain has become uncontrolled and he ended up calling EMS who brought him back into the emergency department.  He received a CT scan of his pelvis with findings of acute, nondisplaced left parasymphyseal and left superior pubic ramus fractures but no other fracture found with intact left total hip arthroplasty.  He was given treatment in ED but has been unable to get up due to uncontrolled pain.  He had findings of dehydration and  hypokalemia from poor oral intake at home unable to access food and water  properly.  Admission was requested for pain management and therapy evaluations.   The patient was initially started on IV opioids.  He was transition to oral opioids.  As time passed, the patient's pelvic pain gradually improved.  He worked with physical therapy.  SNF was initially recommended, but the patient wanted to go home.  He made gradual improvements with physical therapy.  TOC assisted in obtaining a rollator for the patient for home use prior to discharge.  At time of discharge, the patient did not require any IV opioids.  His pain was controlled with taking just once daily  oxycodone .

## 2024-02-09 NOTE — ED Notes (Signed)
 ED TO INPATIENT HANDOFF REPORT  ED Nurse Name and Phone #: [phone]  S Name/Age/Gender Jonathan Benitez 73 y.o. male Room/Bed: APA06/APA06  Code Status   Code Status: Prior  Home/SNF/Other Home Patient oriented to: self, place, time, and situation Is this baseline? Yes   Triage Complete: Triage complete  Chief Complaint Uncontrolled pain [R52]  Triage Note Pt bib rcems for left sided hip pain from a fall last week. Pt states he has already been seen for the fall but the pain has progressively gotten worse.    Allergies Allergies  Allergen Reactions   Nsaids Other (See Comments)     Anemia, Gastrointestinal hemorrhage    Level of Care/Admitting Diagnosis ED Disposition     ED Disposition  Admit   Condition  --   Comment  Hospital Area: [facility] [[id number]]  Level of Care: Med-Surg [16]  Covid Evaluation: Confirmed COVID Negative  Diagnosis: Uncontrolled pain [721020]  Admitting Physician: VICCI AFTON CROME [[id number]]  Attending Physician: VICCI AFTON CROME [[id number]]  Certification:: I certify this patient will need inpatient services for at least 2 midnights  Expected Medical Readiness: 02/11/2024          B Medical/Surgery History 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   Past Surgical History:  Procedure Laterality Date   COLONOSCOPY N/A 03/30/2018   Procedure: COLONOSCOPY;  Surgeon: Golda Claudis PENNER, MD;  [facility];  Service: Endoscopy;  Laterality: N/A;  12:45   COLONOSCOPY WITH PROPOFOL  N/A 01/23/2022   Procedure: COLONOSCOPY WITH PROPOFOL ;  Surgeon: Cindie Carlin POUR, DO;  Location: [facility];  Service: Endoscopy;  Laterality: N/A;   HERNIA REPAIR     [place]   LEFT HEART CATH AND CORONARY ANGIOGRAPHY N/A 04/09/2023   Procedure: LEFT HEART CATH AND CORONARY  ANGIOGRAPHY;  Surgeon: [place], Peter M, MD;  Location: [facility] INVASIVE CV LAB;  Service: Cardiovascular;  Laterality: N/A;   POLYPECTOMY  03/30/2018   Procedure: POLYPECTOMY;  Surgeon: Golda Claudis PENNER, MD;  [facility];  Service: Endoscopy;;  colon   PROSTATE BIOPSY     positive for cancer   TOTAL HIP ARTHROPLASTY Left    [facility] 11/07/17 Dr. Jayson Crome     A IV Location/Drains/Wounds Patient Lines/Drains/Airways Status     Active Line/Drains/Airways     None            Intake/Output Last 24 hours No intake or output data in the 24 hours ending 02/09/24 0932  Labs/Imaging Results for orders placed or performed during the hospital encounter of 02/09/24 (from the past 48 hours)  CBC with Differential     Status: Abnormal   Collection Time: 02/09/24  7:43 AM  Result Value Ref Range   WBC 6.0 4.0 - 10.5 K/uL   RBC 2.97 (L) 4.22 - 5.81 MIL/uL   Hemoglobin 10.3 (L) 13.0 - 17.0 g/dL   HCT 69.1 (L) 60.9 - 47.9 %   MCV 103.7 (H) 80.0 - 100.0 fL   MCH 34.7 (H) 26.0 - 34.0 pg   MCHC 33.4 30.0 - 36.0 g/dL   RDW 86.3 88.4 - 84.4 %   Platelets 247 150 - 400 K/uL   nRBC 0.0 0.0 - 0.2 %   Neutrophils Relative % 61 %   Neutro Abs  3.8 1.7 - 7.7 K/uL   Lymphocytes Relative 20 %   Lymphs Abs 1.2 0.7 - 4.0 K/uL   Monocytes Relative 15 %   Monocytes Absolute 0.9 0.1 - 1.0 K/uL   Eosinophils Relative 2 %   Eosinophils Absolute 0.1 0.0 - 0.5 K/uL   Basophils Relative 1 %   Basophils Absolute 0.0 0.0 - 0.1 K/uL   Immature Granulocytes 1 %   Abs Immature Granulocytes 0.03 0.00 - 0.07 K/uL    Comment: Performed at [facility], [address], [place], [place] [address]  Basic metabolic panel     Status: Abnormal   Collection Time: 02/09/24  7:43 AM  Result Value Ref Range   Sodium 134 (L) 135 - 145 mmol/L   Potassium 3.3 (L) 3.5 - 5.1 mmol/L   Chloride 104 98 - 111 mmol/L   CO2 20 (L) 22 - 32 mmol/L   Glucose, Bld 88 70 - 99 mg/dL    Comment: Glucose reference range  applies only to samples taken after fasting for at least 8 hours.   BUN 5 (L) 8 - 23 mg/dL   Creatinine, Ser 9.16 0.61 - 1.24 mg/dL   Calcium  7.7 (L) 8.9 - 10.3 mg/dL   GFR, Estimated >39 >39 mL/min    Comment: (NOTE) Calculated using the CKD-EPI Creatinine Equation (2021)    Anion gap 10 5 - 15    Comment: Performed at [facility], [address] [place], [place] [address]   CT PELVIS WO CONTRAST Result Date: 02/09/2024 CLINICAL DATA:  Pelvic fracture. Continued left hip pain after falling 5 days ago. EXAM: CT PELVIS WITHOUT CONTRAST TECHNIQUE: Multidetector CT imaging of the pelvis was performed following the standard protocol without intravenous contrast. RADIATION DOSE REDUCTION: This exam was performed according to the departmental dose-optimization program which includes automated exposure control, adjustment of the mA and/or kV according to patient size and/or use of iterative reconstruction technique. COMPARISON:  Left hip radiograph 02/09/2024.  Pelvic CTA 11/19/2023. FINDINGS: Urinary Tract: The visualized distal ureters appear unremarkable. Possible mild bladder wall thickening versus incomplete distension. The bladder is partially obscured by artifact from the left total hip arthroplasty. Bowel: No bowel wall thickening, distention or surrounding inflammation identified within the pelvis. Mild distal colonic diverticulosis. Contrast within the lumen of the appendix. No appendiceal distention or surrounding inflammation. Vascular/Lymphatic: No enlarged pelvic lymph nodes identified. Diffuse iliofemoral atherosclerosis without evidence of aneurysm. Reproductive: The prostate gland and seminal vesicles appear unremarkable. Other: Probable left inguinal herniorrhaphy changes. No evidence of pelvic ascites or pneumoperitoneum. Musculoskeletal: Status post left total hip arthroplasty. The hardware is intact without evidence of loosening. There is no evidence of acute proximal femur fracture,  dislocation or right femoral head osteonecrosis. There are acute, nondisplaced left parasymphyseal and left superior pubic ramus fractures. No other acute pelvic fractures are demonstrated. The sacroiliac joints and symphysis pubis are intact. Chronic degenerative disc disease noted at the lumbosacral junction. No evidence of significant pelvic hematoma. Underlying left pelvic muscular atrophy, likely chronic. IMPRESSION: 1. Acute, nondisplaced left parasymphyseal and left superior pubic ramus fractures. 2. No other acute pelvic fractures identified. Intact left total hip arthroplasty. 3.  Aortic Atherosclerosis (ICD10-I70.0). Electronically Signed   By: Elsie Perone M.D.   On: 02/09/2024 08:57   DG Tibia/Fibula Left Result Date: 02/09/2024 CLINICAL DATA:  Fall with worsening pain. EXAM: LEFT TIBIA AND FIBULA - 2 VIEW COMPARISON:  None Available. FINDINGS: Bones are diffusely demineralized. Deformity of the proximal fibular diaphysis suggests healed  remote trauma. No evidence for an acute fracture. No worrisome lytic or sclerotic osseous abnormality. IMPRESSION: 1. No acute bony findings. 2. Deformity of the proximal fibular diaphysis suggests healed remote trauma. Electronically Signed   By: Camellia Candle M.D.   On: 02/09/2024 07:00   DG Hip Unilat W or Wo Pelvis 2-3 Views Left Result Date: 02/09/2024 CLINICAL DATA:  Fall.  Left-sided hip pain. EXAM: DG HIP (WITH OR WITHOUT PELVIS) 2-3V LEFT COMPARISON:  None Available. FINDINGS: Bones are demineralized. Status post left hip replacement. No evidence for dislocation or periprosthetic femur fracture. IMPRESSION: Status post left hip replacement without acute bony findings. Electronically Signed   By: Camellia Candle M.D.   On: 02/09/2024 06:59    Pending Labs Unresulted Labs (From admission, onward)    None       Vitals/Pain Today's Vitals   02/09/24 0603 02/09/24 0603 02/09/24 0604 02/09/24 0630  BP:  111/80 111/80 121/84  Pulse:  90 90 85   Resp:  18    Temp: 98.1 F (36.7 C)     TempSrc: Oral     SpO2:  98% 98% 98%  Weight:      Height:      PainSc:        Isolation Precautions No active isolations  Medications Medications  potassium chloride  SA (KLOR-CON  M) CR tablet 40 mEq (has no administration in time range)  HYDROcodone -acetaminophen  (NORCO/VICODIN) 5-325 MG per tablet 1 tablet (1 tablet Oral Given 02/09/24 0717)    Mobility non-ambulatory     Focused Assessments    R Recommendations: See Admitting Provider Note  Report given to:   Additional Notes:

## 2024-02-10 DIAGNOSIS — F1011 Alcohol abuse, in remission: Secondary | ICD-10-CM

## 2024-02-10 DIAGNOSIS — E871 Hypo-osmolality and hyponatremia: Secondary | ICD-10-CM

## 2024-02-10 DIAGNOSIS — S32592A Other specified fracture of left pubis, initial encounter for closed fracture: Secondary | ICD-10-CM | POA: Diagnosis not present

## 2024-02-10 DIAGNOSIS — E876 Hypokalemia: Secondary | ICD-10-CM

## 2024-02-10 LAB — CBC
HCT: 32.1 % — ABNORMAL LOW (ref 39.0–52.0)
Hemoglobin: 10.5 g/dL — ABNORMAL LOW (ref 13.0–17.0)
MCH: 33.8 pg (ref 26.0–34.0)
MCHC: 32.7 g/dL (ref 30.0–36.0)
MCV: 103.2 fL — ABNORMAL HIGH (ref 80.0–100.0)
Platelets: 280 K/uL (ref 150–400)
RBC: 3.11 MIL/uL — ABNORMAL LOW (ref 4.22–5.81)
RDW: 13.6 % (ref 11.5–15.5)
WBC: 5.5 K/uL (ref 4.0–10.5)
nRBC: 0 % (ref 0.0–0.2)

## 2024-02-10 LAB — BASIC METABOLIC PANEL WITH GFR
Anion gap: 8 (ref 5–15)
BUN: 6 mg/dL — ABNORMAL LOW (ref 8–23)
CO2: 21 mmol/L — ABNORMAL LOW (ref 22–32)
Calcium: 7.7 mg/dL — ABNORMAL LOW (ref 8.9–10.3)
Chloride: 107 mmol/L (ref 98–111)
Creatinine, Ser: 0.68 mg/dL (ref 0.61–1.24)
GFR, Estimated: >60 mL/min (ref >60)
Glucose, Bld: 93 mg/dL (ref 70–99)
Potassium: 3.6 mmol/L (ref 3.5–5.1)
Sodium: 136 mmol/L (ref 135–145)

## 2024-02-10 LAB — MAGNESIUM: Magnesium: 2.2 mg/dL (ref 1.7–2.4)

## 2024-02-10 MED ORDER — FERROUS SULFATE 325 (65 FE) MG PO TABS
325.0000 mg | ORAL_TABLET | Freq: Every day | ORAL | Status: DC
  Administered 2024-02-10 – 2024-02-12 (×3): 325 mg via ORAL
  Filled 2024-02-10 (×3): qty 1

## 2024-02-10 NOTE — Plan of Care (Signed)

## 2024-02-10 NOTE — Progress Notes (Signed)
 Physical Therapy Treatment Patient Details Name: Jonathan Benitez MRN: [id number] DOB: 1950-11-18 Today's Date: 02/10/2024   History of Present Illness Jonathan Benitez is a 72 year old male who was living alone, past medical history of chronic alcoholism, gout, recreational substance use, hyperlipidemia, protein calorie malnutrition, history of prostate cancer, diastolic heart failure, GERD, Crohn's disease, chronic mesenteric ischemia, chronic diarrhea, poor appetite, failure to thrive, who was treated in the ED on 02/04/2024 after presenting with a syncopal episode and head injury after a fall.  He was treated for a facial soft tissue contusion of the face and found to have fracture of the nasal bone.  At the time he was able to feel better after treatment and was ambulatory without difficulty.  He reports that after going home he is increasing pain on the left sided area of his hip.  Has been getting worse.  Is affecting him being able to ambulate and take care of himself at home.  He reports that the pain has become uncontrolled and he ended up calling EMS who brought him back into the emergency department.  He received a CT scan of his pelvis with findings of acute, nondisplaced left parasymphyseal and left superior pubic  ramus fractures but no other fracture found with intact left total hip arthroplasty.  He was given treatment in ED but has been unable to get up due to uncontrolled pain.  He had findings of dehydration and  hypokalemia from poor oral intake at home unable to access food and water  properly.  Admission was requested for pain management and therapy evaluations.    PT Comments  Patient demonstrates good return for sitting up at bedside, but has to move LLE with hands, once seated tolerated completing BLE exercises with occasional active assistance for left hip raises and LAQ's and has increased endurance/distance for gait training, but limited mostly due to fatigue and increasing left  groin/hip pain. Patient tolerated sitting up in chair after therapy.  Patient will benefit from continued skilled physical therapy in hospital and recommended venue below to increase strength, balance, endurance for safe ADLs and gait.      If plan is discharge home, recommend the following: A lot of help with walking and/or transfers;A little help with bathing/dressing/bathroom;Assistance with cooking/housework;Assist for transportation;Help with stairs or ramp for entrance   Can travel by private vehicle     Yes  Equipment Recommendations  None recommended by PT    Recommendations for Other Services       Precautions / Restrictions Precautions Precautions: Fall Recall of Precautions/Restrictions: Intact Restrictions Weight Bearing Restrictions Per Provider Order: No     Mobility  Bed Mobility Overal bed mobility: Needs Assistance Bed Mobility: Supine to Sit     Supine to sit: Modified independent (Device/Increase time), Supervision     General bed mobility comments: labored movement, has to use BUE to help move LLE when sittig up at bedside    Transfers Overall transfer level: Needs assistance Equipment used: Rolling walker (2 wheels) Transfers: Sit to/from Stand, Bed to chair/wheelchair/BSC Sit to Stand: Min assist   Step pivot transfers: Min assist       General transfer comment: slow labored movement with increased tolerance for weightbearing on LLE    Ambulation/Gait Ambulation/Gait assistance: Min assist Gait Distance (Feet): 30 Feet Assistive device: Rolling walker (2 wheels) Gait Pattern/deviations: Step-to pattern, Decreased step length - right, Decreased stance time - left, Decreased stride length, Decreased weight shift to left, Decreased step length - left,  Antalgic Gait velocity: decreased     General Gait Details: slow labored movement with increased endurance/distance for ambulation without loss of balance, but limited mostly due to c/o fatigue  and increasing left groin/hip pain   Stairs             Wheelchair Mobility     Tilt Bed    Modified Rankin (Stroke Patients Only)       Balance Overall balance assessment: Needs assistance Sitting-balance support: Feet supported, No upper extremity supported Sitting balance-Leahy Scale: Good Sitting balance - Comments: seated at EOB   Standing balance support: Reliant on assistive device for balance, During functional activity, Bilateral upper extremity supported Standing balance-Leahy Scale: Fair Standing balance comment: using RW                            Communication Communication Communication: No apparent difficulties  Cognition Arousal: Alert Behavior During Therapy: WFL for tasks assessed/performed   PT - Cognitive impairments: No apparent impairments                         Following commands: Intact      Cueing Cueing Techniques: Verbal cues, Tactile cues  Exercises General Exercises - Lower Extremity Long Arc Quad: Seated, AROM, Strengthening, Both, 10 reps, AAROM Hip Flexion/Marching: Seated, AROM, AAROM, Strengthening, Both, 10 reps Toe Raises: Seated, AROM, Strengthening, Both, 10 reps Heel Raises: Seated, AROM, Strengthening, Both, 10 reps    General Comments        Pertinent Vitals/Pain Pain Assessment Pain Location: L hip and groin area. Pain Descriptors / Indicators: Sore, Discomfort Pain Intervention(s): Limited activity within patient's tolerance, Monitored during session, Repositioned    Home Living                          Prior Function            PT Goals (current goals can now be found in the care plan section) Acute Rehab PT Goals Patient Stated Goal: pt would like to return home. PT Goal Formulation: With patient Time For Goal Achievement: 02/16/24 Potential to Achieve Goals: Good Progress towards PT goals: Progressing toward goals    Frequency    Min 3X/week      PT Plan       Co-evaluation              AM-PAC PT 6 Clicks Mobility   Outcome Measure  Help needed turning from your back to your side while in a flat bed without using bedrails?: None Help needed moving from lying on your back to sitting on the side of a flat bed without using bedrails?: None Help needed moving to and from a bed to a chair (including a wheelchair)?: A Little Help needed standing up from a chair using your arms (e.g., wheelchair or bedside chair)?: A Little Help needed to walk in hospital room?: A Lot Help needed climbing 3-5 steps with a railing? : A Lot 6 Click Score: 18    End of Session   Activity Tolerance: Patient tolerated treatment well;Patient limited by fatigue Patient left: in chair;with call bell/phone within reach Nurse Communication: Mobility status PT Visit Diagnosis: Other abnormalities of gait and mobility (R26.89);Muscle weakness (generalized) (M62.81);History of falling (Z91.81);Unsteadiness on feet (R26.81)     Time: 9179-9157 PT Time Calculation (min) (ACUTE ONLY): 22 min  Charges:    $Gait  Training: 8-22 mins $Therapeutic Exercise: 8-22 mins PT General Charges $$ ACUTE PT VISIT: 1 Visit                     11:54 AM, 02/10/24 Lynwood Music, MPT Physical Therapist with [facility] 336 [phone] office [phone] mobile phone

## 2024-02-10 NOTE — Plan of Care (Signed)
  Problem: Education: Goal: Knowledge of General Education information will improve Description: Including pain rating scale, medication(s)/side effects and non-pharmacologic comfort measures Outcome: Progressing   Problem: Clinical Measurements: Goal: Ability to maintain clinical measurements within normal limits will improve Outcome: Progressing Goal: Will remain free from infection Outcome: Progressing   Problem: Coping: Goal: Level of anxiety will decrease Outcome: Progressing   Problem: Elimination: Goal: Will not experience complications related to urinary retention Outcome: Progressing   Problem: Pain Managment: Goal: General experience of comfort will improve and/or be controlled Outcome: Progressing   Problem: Safety: Goal: Ability to remain free from injury will improve Outcome: Progressing   Problem: Skin Integrity: Goal: Risk for impaired skin integrity will decrease Outcome: Progressing

## 2024-02-10 NOTE — NC FL2 (Addendum)
 [place]  MEDICAID FL2 LEVEL OF CARE FORM     IDENTIFICATION  Patient Name: Jonathan Benitez Birthdate: September 02, 1950 Sex: male Admission Date (Current Location): 02/09/2024  [facility] and [place] Number:  [employer] and Address:  [facility],  [address] [place] [address]      Provider Number: [id number]  Attending Physician Name and Address:  Vicci Afton CROME, MD  Relative Name and Phone Number:       Current Level of Care: Hospital Recommended Level of Care: Skilled Nursing Facility Prior Approval Number:    Date Approved/Denied:   PASRR Number:  [id number]  Discharge Plan: SNF    Current Diagnoses: Patient Active Problem List   Diagnosis Date Noted   Uncontrolled pain 02/09/2024   Inferior pubic ramus fracture, left, sequela 02/09/2024   Chronic diastolic heart failure (HCC) 02/09/2024   Hyponatremia 02/09/2024   Chronic mesenteric ischemia 01/23/2024   Hypokalemia 08/06/2023   NSTEMI (non-ST elevated myocardial infarction) 04/08/2023   History of recreational drug use 04/08/2023   History of alcohol abuse 04/08/2023   History of prostate cancer 04/08/2023   History of gout 04/08/2023   HFrEF (heart failure with reduced ejection fraction) 04/08/2023   Acute gout of left foot 01/31/2023   Benign prostatic hyperplasia with nocturia 01/31/2023   Mixed hyperlipidemia 01/31/2023   CAP (community acquired pneumonia) 11/08/2022   Acute mucoid otitis media of right ear 03/14/2022   Decreased appetite 02/27/2022   Superior mesenteric artery stenosis 01/30/2022   Hospital discharge follow-up 01/30/2022   Proctitis 01/21/2022   Cardiomyopathy (HCC) 01/20/2022   AKI (acute kidney injury) 01/19/2022   Failure to thrive in adult 01/19/2022   Marijuana abuse 01/19/2022   Elevated troponin 01/19/2022   Acute idiopathic gout of left ankle 05/01/2021   Iron deficiency anemia due to chronic blood loss 02/14/2021   Neck pain 02/14/2021    Bunion of great toe of right foot 02/14/2021   Primary insomnia 09/07/2020   Chronic diarrhea 05/19/2020   Arthritis of knee 05/19/2020   Moderate protein-calorie malnutrition 05/19/2020   Cyclic vomiting syndrome 05/19/2020   Rectal bleeding 03/25/2018   Avascular necrosis of hip, left (HCC) 11/12/2017   Gastroesophageal reflux disease 12/19/2016   Anemia 06/28/2012   Prostate cancer (HCC) 06/28/2012   HTN (hypertension) 06/28/2012   Crohn's disease (HCC) 06/28/2012   Alcoholism (HCC) 06/28/2012    Orientation RESPIRATION BLADDER Height & Weight     Self, Time, Situation, Place  Normal Continent Weight: 185 lb 3 oz (84 kg) Height:  6' (182.9 cm)  BEHAVIORAL SYMPTOMS/MOOD NEUROLOGICAL BOWEL NUTRITION STATUS      Continent Diet  AMBULATORY STATUS COMMUNICATION OF NEEDS Skin   Extensive Assist Verbally Normal                       Personal Care Assistance Level of Assistance  Bathing, Feeding, Dressing Bathing Assistance: Limited assistance Feeding assistance: Independent Dressing Assistance: Limited assistance     Functional Limitations Info  Sight, Hearing, Speech Sight Info: Impaired Hearing Info: Adequate Speech Info: Adequate    SPECIAL CARE FACTORS FREQUENCY  PT (By licensed PT), OT (By licensed OT)     PT Frequency: 5 times weekly OT Frequency: 5 times weekly            Contractures Contractures Info: Not present    Additional Factors Info  Code Status, Allergies Code Status Info: FULL Allergies Info: Nsaids  Current Medications (02/10/2024):  This is the current hospital active medication list Current Facility-Administered Medications  Medication Dose Route Frequency Provider Last Rate Last Admin   (feeding supplement) PROSource Plus liquid 30 mL  30 mL Oral TID BM Johnson, Clanford L, MD   30 mL at 02/10/24 0816   acetaminophen  (TYLENOL ) tablet 650 mg  650 mg Oral Q6H Johnson, Clanford L, MD   650 mg at 02/10/24 1027   Or    acetaminophen  (TYLENOL ) suppository 650 mg  650 mg Rectal Q6H Johnson, Clanford L, MD       allopurinol  (ZYLOPRIM ) tablet 200 mg  200 mg Oral Daily Johnson, Clanford L, MD   200 mg at 02/10/24 0815   carvedilol  (COREG ) tablet 6.25 mg  6.25 mg Oral BID WC Johnson, Clanford L, MD   6.25 mg at 02/10/24 0815   docusate sodium (COLACE) capsule 100 mg  100 mg Oral BID Johnson, Clanford L, MD   100 mg at 02/10/24 0815   enoxaparin (LOVENOX) injection 40 mg  40 mg Subcutaneous Q24H Johnson, Clanford L, MD       fentaNYL  (SUBLIMAZE ) injection 12.5 mcg  12.5 mcg Intravenous Q2H PRN Johnson, Clanford L, MD       ferrous sulfate tablet 325 mg  325 mg Oral Q breakfast Johnson, Clanford L, MD   325 mg at 02/10/24 9089   fluticasone  (FLONASE ) 50 MCG/ACT nasal spray 2 spray  2 spray Each Nare Daily PRN Johnson, Clanford L, MD       folic acid  (FOLVITE ) tablet 1 mg  1 mg Oral Daily Johnson, Clanford L, MD   1 mg at 02/10/24 9185   loratadine  (CLARITIN ) tablet 10 mg  10 mg Oral Daily Johnson, Clanford L, MD   10 mg at 02/10/24 9185   LORazepam  (ATIVAN ) tablet 1-4 mg  1-4 mg Oral Q1H PRN Johnson, Clanford L, MD       Or   LORazepam  (ATIVAN ) injection 1-4 mg  1-4 mg Intravenous Q1H PRN Johnson, Clanford L, MD       magnesium  oxide (MAG-OX) tablet 400 mg  400 mg Oral BID Vicci, Clanford L, MD   400 mg at 02/10/24 9185   multivitamin with minerals tablet 1 tablet  1 tablet Oral Daily Vicci, Clanford L, MD   1 tablet at 02/10/24 0815   ondansetron  (ZOFRAN ) tablet 4 mg  4 mg Oral Q6H PRN Johnson, Clanford L, MD       Or   ondansetron  (ZOFRAN ) injection 4 mg  4 mg Intravenous Q6H PRN Johnson, Clanford L, MD       oxyCODONE  (Oxy IR/ROXICODONE ) immediate release tablet 5 mg  5 mg Oral Q6H PRN Johnson, Clanford L, MD   5 mg at 02/09/24 1140   pantoprazole  (PROTONIX ) EC tablet 40 mg  40 mg Oral QPM Johnson, Clanford L, MD   40 mg at 02/09/24 1745   potassium chloride  SA (KLOR-CON  M) CR tablet 20 mEq  20 mEq Oral BID  Johnson, Clanford L, MD       sucralfate  (CARAFATE ) tablet 1 g  1 g Oral TID WC & HS Johnson, Clanford L, MD   1 g at 02/10/24 9185   thiamine  (VITAMIN B1) tablet 100 mg  100 mg Oral Daily Johnson, Clanford L, MD   100 mg at 02/10/24 9186   Or   thiamine  (VITAMIN B1) injection 100 mg  100 mg Intravenous Daily Johnson, Clanford L, MD       traZODone  (DESYREL ) tablet 100  mg  100 mg Oral QHS PRN Johnson, Clanford L, MD       triamcinolone ointment (KENALOG) 0.1 % 1 Application  1 Application Topical QID PRN Johnson, Clanford L, MD         Discharge Medications: Please see discharge summary for a list of discharge medications.  Relevant Imaging Results:  Relevant Lab Results:   Additional Information SSN: [id number] [place]

## 2024-02-10 NOTE — TOC Progression Note (Signed)
 Transition of Care [facility]) - Progression Note    Patient Details  Name: Jonathan Benitez MRN: [id number] Date of Birth: [birth year]  [facility] CM/SW Contact  Lucie Lunger, [place] Phone Number: 02/10/2024, 11:09 AM  Clinical Narrative:    CSW updated that pt is now interested in SNF placement. CSW spoke with pt at bedside to review SNF vs home with [facility]. Pt states he feels SNF placement would be best for him since he does live alone and is weak. Pt is agreeable to SNF referral being sent out to local facilities for review. TOC to follow.   Expected Discharge Plan: Home w Home Health Services Barriers to Discharge: Continued Medical Work up               Expected Discharge Plan and Services In-house Referral: Clinical Social Work Discharge Planning Services: CM Consult Post Acute Care Choice: Home Health Living arrangements for the past 2 months: Single Family Home                 DME Arranged: Walker rolling DME Agency: [employer]       HH Arranged: PT, OT           Social Drivers of Health (SDOH) Interventions SDOH Screenings   Food Insecurity: No Food Insecurity (02/09/2024)  Housing: Low Risk  (02/09/2024)  Transportation Needs: No Transportation Needs (02/09/2024)  Utilities: Not At Risk (02/09/2024)  Depression (PHQ2-9): Low Risk  (08/05/2023)  Financial Resource Strain: Medium Risk (03/26/2021)  Physical Activity: Insufficiently Active (03/26/2021)  Social Connections: Socially Isolated (02/09/2024)  Stress: No Stress Concern Present (03/26/2021)  Tobacco Use: Low Risk  (02/09/2024)    Readmission Risk Interventions    02/09/2024    3:50 PM 11/08/2022    9:32 AM  Readmission Risk Prevention Plan  Transportation Screening Complete Complete  HRI or Home Care Consult Complete Complete  Social Work Consult for Recovery Care Planning/Counseling Complete Complete  Palliative Care Screening Not Applicable Not Applicable  Medication Review Furniture conservator/restorer) Complete Complete

## 2024-02-10 NOTE — Progress Notes (Signed)
 PROGRESS NOTE   Jonathan Benitez  FMW:[id number] DOB: 02/16/1951 DOA: 02/09/2024 PCP: Tobie Suzzane POUR, MD   Chief Complaint  Patient presents with   Hip Pain   Level of care: Med-Surg  Brief Admission History:  73 year old male who was living alone, past medical history of chronic alcoholism, gout, recreational substance use, hyperlipidemia, protein calorie malnutrition, history of prostate cancer, diastolic heart failure, GERD, Crohn's disease, chronic mesenteric ischemia, chronic diarrhea, poor appetite, failure to thrive, who was treated in the ED on 02/04/2024 after presenting with a syncopal episode and head injury after a fall.  He was treated for a facial soft tissue contusion of the face and found to have fracture of the nasal bone.  At the time he was able to feel better after treatment and was ambulatory without difficulty.  He reports that after going home he is increasing pain on the left sided area of his hip.  Has been getting worse.  Is affecting him being able to ambulate and take care of himself at home.  He reports that the pain has become uncontrolled and he ended up calling EMS who brought him back into the emergency department.  He received a CT scan of his pelvis with findings of acute, nondisplaced left parasymphyseal and left superior pubic ramus fractures but no other fracture found with intact left total hip arthroplasty.  He was given treatment in ED but has been unable to get up due to uncontrolled pain.  He had findings of dehydration and  hypokalemia from poor oral intake at home unable to access food and water  properly.  Admission was requested for pain management and therapy evaluations.     Assessment and Plan:  Acute left pubic ramus fractures -- from recent falls at home  -- Fortunately he is having increasing pain and immobility -- Admitting for pain management and therapy evaluation -- PT/OT evaluation requested--recommending SNF  -- fall precautions    Uncontrolled pain -- improving -- start scheduled acetaminophen  around the clock -- oral oxycodone  as needed for moderate pain -- IV fentanyl  for severe pain and prior to therapy sessions   Chronic diastolic heart failure -- appears dry and gently hydrated -- dc IVF as he is eating/drinking better     Dehydration -- treated -- IV fluids ordered and completed    Hyponatremia -- resolved  -- secondary to dehydration -- treating with IV fluids -- RESOLVED    Hypokalemia -- repleted  -- check Mg - 1.3 - repleted -- added potassium to IV fluids  Hypomagnesemia  -- IV replacement ordered and repleted to goal>2   Chronic alcoholism -- Patient reporting he is drinking 2 beers per day -- Added CIWA protocol to avoid acute alcohol withdrawal  Macrocytic anemia Iron deficiency -- continue daily iron supplement -- B12 (668), folic acid  (10.4) levels are reassuring   History of recreational substance use -- Patient noted recent use of marijuana -- Check urine toxicology screen-- pending   Adult Failure to thrive -- PT/OT/TOC evaluation requested -- working on SNF rehab placement    GERD -- pantoprazole  ordered for GI protection   DVT prophylaxis: enoxaparin Code Status: Full  Family Communication:  Disposition: SNF pending     Consultants:  PT/TOC Procedures:   Antimicrobials:    Subjective: Pt says his pain is 8/10, he is weak and he now has decided that SNF would be a better option for him as he lives alone.  Objective: Vitals:   02/09/24 2039 02/10/24 0015  02/10/24 0450 02/10/24 1229  BP: 104/67 133/79 125/86 124/76  Pulse: 74 75 69 83  Resp: 17 17 17 18   Temp: 97.8 F (36.6 C) 97.7 F (36.5 C) 97.7 F (36.5 C) 98.1 F (36.7 C)  TempSrc: Oral Oral Oral Oral  SpO2: 94% 99% 100% 100%  Weight:      Height:        Intake/Output Summary (Last 24 hours) at 02/10/2024 1247 Last data filed at 02/10/2024 1229 Gross per 24 hour  Intake 109.51 ml  Output  1350 ml  Net -1240.49 ml   Filed Weights   02/09/24 0600 02/09/24 1109  Weight: 84 kg 84 kg   Examination:  General exam: Appears calm and comfortable  Respiratory system: Clear to auscultation. Respiratory effort normal. Cardiovascular system: normal S1 & S2 heard. No JVD, murmurs, rubs, gallops or clicks. No pedal edema. Gastrointestinal system: Abdomen is nondistended, soft and nontender. No organomegaly or masses felt. Normal bowel sounds heard. Central nervous system: Alert and oriented. No focal neurological deficits. Extremities: Symmetric 5 x 5 power. Skin: No rashes, lesions or ulcers. Psychiatry: Judgement and insight appear normal. Mood & affect appropriate.   Data Reviewed: I have personally reviewed following labs and imaging studies  CBC: Recent Labs  Lab 02/04/24 0805 02/09/24 0743 02/10/24 0442  WBC 5.4 6.0 5.5  NEUTROABS 2.5 3.8  --   HGB 10.7* 10.3* 10.5*  HCT 31.2* 30.8* 32.1*  MCV 100.6* 103.7* 103.2*  PLT 186 247 280    Basic Metabolic Panel: Recent Labs  Lab 02/04/24 0805 02/09/24 0743 02/10/24 0442  NA 134* 134* 136  K 3.2* 3.3* 3.6  CL 96* 104 107  CO2 26 20* 21*  GLUCOSE 89 88 93  BUN <5* 5* 6*  CREATININE 0.83 0.83 0.68  CALCIUM  8.7* 7.7* 7.7*  MG  --  1.3* 2.2    CBG: Recent Labs  Lab 02/04/24 0803  GLUCAP 105*    No results found for this or any previous visit (from the past 240 hours).   Radiology Studies: CT PELVIS WO CONTRAST Result Date: 02/09/2024 CLINICAL DATA:  Pelvic fracture. Continued left hip pain after falling 5 days ago. EXAM: CT PELVIS WITHOUT CONTRAST TECHNIQUE: Multidetector CT imaging of the pelvis was performed following the standard protocol without intravenous contrast. RADIATION DOSE REDUCTION: This exam was performed according to the departmental dose-optimization program which includes automated exposure control, adjustment of the mA and/or kV according to patient size and/or use of iterative  reconstruction technique. COMPARISON:  Left hip radiograph 02/09/2024.  Pelvic CTA 11/19/2023. FINDINGS: Urinary Tract: The visualized distal ureters appear unremarkable. Possible mild bladder wall thickening versus incomplete distension. The bladder is partially obscured by artifact from the left total hip arthroplasty. Bowel: No bowel wall thickening, distention or surrounding inflammation identified within the pelvis. Mild distal colonic diverticulosis. Contrast within the lumen of the appendix. No appendiceal distention or surrounding inflammation. Vascular/Lymphatic: No enlarged pelvic lymph nodes identified. Diffuse iliofemoral atherosclerosis without evidence of aneurysm. Reproductive: The prostate gland and seminal vesicles appear unremarkable. Other: Probable left inguinal herniorrhaphy changes. No evidence of pelvic ascites or pneumoperitoneum. Musculoskeletal: Status post left total hip arthroplasty. The hardware is intact without evidence of loosening. There is no evidence of acute proximal femur fracture, dislocation or right femoral head osteonecrosis. There are acute, nondisplaced left parasymphyseal and left superior pubic ramus fractures. No other acute pelvic fractures are demonstrated. The sacroiliac joints and symphysis pubis are intact. Chronic degenerative disc disease noted at the  lumbosacral junction. No evidence of significant pelvic hematoma. Underlying left pelvic muscular atrophy, likely chronic. IMPRESSION: 1. Acute, nondisplaced left parasymphyseal and left superior pubic ramus fractures. 2. No other acute pelvic fractures identified. Intact left total hip arthroplasty. 3.  Aortic Atherosclerosis (ICD10-I70.0). Electronically Signed   By: Elsie Perone M.D.   On: 02/09/2024 08:57   DG Tibia/Fibula Left Result Date: 02/09/2024 CLINICAL DATA:  Fall with worsening pain. EXAM: LEFT TIBIA AND FIBULA - 2 VIEW COMPARISON:  None Available. FINDINGS: Bones are diffusely demineralized.  Deformity of the proximal fibular diaphysis suggests healed remote trauma. No evidence for an acute fracture. No worrisome lytic or sclerotic osseous abnormality. IMPRESSION: 1. No acute bony findings. 2. Deformity of the proximal fibular diaphysis suggests healed remote trauma. Electronically Signed   By: Camellia Candle M.D.   On: 02/09/2024 07:00   DG Hip Unilat W or Wo Pelvis 2-3 Views Left Result Date: 02/09/2024 CLINICAL DATA:  Fall.  Left-sided hip pain. EXAM: DG HIP (WITH OR WITHOUT PELVIS) 2-3V LEFT COMPARISON:  None Available. FINDINGS: Bones are demineralized. Status post left hip replacement. No evidence for dislocation or periprosthetic femur fracture. IMPRESSION: Status post left hip replacement without acute bony findings. Electronically Signed   By: Camellia Candle M.D.   On: 02/09/2024 06:59    Scheduled Meds:  (feeding supplement) PROSource Plus  30 mL Oral TID BM   acetaminophen   650 mg Oral Q6H   Or   acetaminophen   650 mg Rectal Q6H   allopurinol   200 mg Oral Daily   carvedilol   6.25 mg Oral BID WC   docusate sodium  100 mg Oral BID   enoxaparin (LOVENOX) injection  40 mg Subcutaneous Q24H   ferrous sulfate  325 mg Oral Q breakfast   folic acid   1 mg Oral Daily   loratadine   10 mg Oral Daily   magnesium  oxide  400 mg Oral BID   multivitamin with minerals  1 tablet Oral Daily   pantoprazole   40 mg Oral QPM   potassium chloride  SA  20 mEq Oral BID   sucralfate   1 g Oral TID WC & HS   thiamine   100 mg Oral Daily   Or   thiamine   100 mg Intravenous Daily   Continuous Infusions:   LOS: 1 day   Time spent: 53 mins  Dontrelle Mazon Vicci, MD How to contact the [facility] Attending or Consulting provider 7A - 7P or covering provider during after hours 7P -7A, for this patient?  Check the care team in [facility] and look for a) attending/consulting [facility] provider listed and b) the [facility] team listed Log into [email or web address] to find provider on call.  Locate the [facility] provider you are looking for  under [facility] and page to a number that you can be directly reached. If you still have difficulty reaching the provider, please page the [facility] (Director on Call) for the Hospitalists listed on amion for assistance.  02/10/2024, 12:47 PM

## 2024-02-11 DIAGNOSIS — S32592S Other specified fracture of left pubis, sequela: Secondary | ICD-10-CM

## 2024-02-11 DIAGNOSIS — K551 Chronic vascular disorders of intestine: Secondary | ICD-10-CM

## 2024-02-11 LAB — URINE DRUG SCREEN
Amphetamines: NEGATIVE
Barbiturates: NEGATIVE
Benzodiazepines: NEGATIVE
Cocaine: NEGATIVE
Fentanyl: NEGATIVE
Methadone Scn, Ur: NEGATIVE
Opiates: NEGATIVE
Tetrahydrocannabinol: POSITIVE — AB

## 2024-02-11 LAB — CBC
HCT: 28.1 % — ABNORMAL LOW (ref 39.0–52.0)
Hemoglobin: 9.2 g/dL — ABNORMAL LOW (ref 13.0–17.0)
MCH: 33.6 pg (ref 26.0–34.0)
MCHC: 32.7 g/dL (ref 30.0–36.0)
MCV: 102.6 fL — ABNORMAL HIGH (ref 80.0–100.0)
Platelets: 273 K/uL (ref 150–400)
RBC: 2.74 MIL/uL — ABNORMAL LOW (ref 4.22–5.81)
RDW: 13.3 % (ref 11.5–15.5)
WBC: 4.9 K/uL (ref 4.0–10.5)
nRBC: 0 % (ref 0.0–0.2)

## 2024-02-11 LAB — BASIC METABOLIC PANEL WITH GFR
Anion gap: 10 (ref 5–15)
BUN: 9 mg/dL (ref 8–23)
CO2: 20 mmol/L — ABNORMAL LOW (ref 22–32)
Calcium: 8.1 mg/dL — ABNORMAL LOW (ref 8.9–10.3)
Chloride: 110 mmol/L (ref 98–111)
Creatinine, Ser: 0.67 mg/dL (ref 0.61–1.24)
GFR, Estimated: >60 mL/min (ref >60)
Glucose, Bld: 83 mg/dL (ref 70–99)
Potassium: 3.7 mmol/L (ref 3.5–5.1)
Sodium: 140 mmol/L (ref 135–145)

## 2024-02-11 MED ORDER — BOOST / RESOURCE BREEZE PO LIQD CUSTOM
1.0000 | Freq: Three times a day (TID) | ORAL | Status: DC
  Administered 2024-02-11: 1 via ORAL

## 2024-02-11 NOTE — Plan of Care (Signed)
  Problem: Education: Goal: Knowledge of General Education information will improve Description: Including pain rating scale, medication(s)/side effects and non-pharmacologic comfort measures Outcome: Progressing   Problem: Health Behavior/Discharge Planning: Goal: Ability to manage health-related needs will improve Outcome: Progressing   Problem: Clinical Measurements: Goal: Ability to maintain clinical measurements within normal limits will improve Outcome: Progressing Goal: Will remain free from infection Outcome: Progressing Goal: Respiratory complications will improve Outcome: Progressing   Problem: Clinical Measurements: Goal: Ability to maintain clinical measurements within normal limits will improve Outcome: Progressing Goal: Will remain free from infection Outcome: Progressing Goal: Respiratory complications will improve Outcome: Progressing   Problem: Activity: Goal: Risk for activity intolerance will decrease Outcome: Progressing   Problem: Elimination: Goal: Will not experience complications related to bowel motility Outcome: Progressing Goal: Will not experience complications related to urinary retention Outcome: Progressing   Problem: Safety: Goal: Ability to remain free from injury will improve Outcome: Progressing

## 2024-02-11 NOTE — Progress Notes (Signed)
 PROGRESS NOTE  Jonathan Benitez FMW:[id number] DOB: January 14, 1951 DOA: 02/09/2024 PCP: Tobie Suzzane POUR, MD  Brief History:  73 year old male who was living alone, past medical history of chronic alcoholism, gout, recreational substance use, hyperlipidemia, protein calorie malnutrition, history of prostate cancer, diastolic heart failure, GERD, Crohn's disease, chronic mesenteric ischemia, chronic diarrhea, poor appetite, failure to thrive, who was treated in the ED on 02/04/2024 after presenting with a syncopal episode and head injury after a fall.  He was treated for a facial soft tissue contusion of the face and found to have fracture of the nasal bone.  At the time he was able to feel better after treatment and was ambulatory without difficulty.  He reports that after going home he is increasing pain on the left sided area of his hip.  Has been getting worse.  Is affecting him being able to ambulate and take care of himself at home.  He reports that the pain has become uncontrolled and he ended up calling EMS who brought him back into the emergency department.  He received a CT scan of his pelvis with findings of acute, nondisplaced left parasymphyseal and left superior pubic ramus fractures but no other fracture found with intact left total hip arthroplasty.  He was given treatment in ED but has been unable to get up due to uncontrolled pain.  He had findings of dehydration and  hypokalemia from poor oral intake at home unable to access food and water  properly.  Admission was requested for pain management and therapy evaluations.     Assessment/Plan:  Acute left pubic ramus fractures -- from recent falls at home  -- Fortunately he is having improving pain and immobility -- Admitting for pain management and therapy evaluation -- PT/OT evaluation requested--recommending SNF  -- pt refuses SNF --awaiting for DME to be delivered to home   Uncontrolled pain -- improving -- continue scheduled  acetaminophen  around the clock -- oral oxycodone  as needed for moderate pain -- IV fentanyl  for severe pain and prior to therapy sessions   Chronic diastolic heart failure -- clinically compensated -- 04/08/23 Echo EF 50-55%, +WMA, G1DD -- dc IVF as he is eating/drinking better     Dehydration -- treated -- IV fluids ordered and completed    Hyponatremia -- resolved  -- secondary to dehydration -- treating with IV fluids>>improved   Hypokalemia  -- repleted -- added potassium to IV fluids   Hypomagnesemia  -- repleted   Alcohol dependence -- Patient reporting he is drinking 3 beers per day -- Added CIWA protocol to avoid acute alcohol withdrawal -- no signs of withdrawal   Macrocytic anemia Iron deficiency -- continue daily iron supplement -- B12 (668), folic acid  (10.4) levels are reassuring   History of recreational substance use -- Patient noted recent use of marijuana -- Check urine toxicology screen-+THC   Adult Failure to thrive -- PT/OT/TOC evaluation requested -- B12 -- folate --TSH   GERD -- pantoprazole  ordered   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  Denies f/c, sob, n/v/d, cp, abd  pain  Objective: Vitals:   02/11/24 0441 02/11/24 1009 02/11/24 1331 02/11/24 1649  BP: (!) 140/92 (!) 142/81 (!) 148/94 (!) 152/93  Pulse: 88 90 92 95  Resp: 18 19 17    Temp: 98 F (36.7 C) 98.1 F (36.7 C) 98 F (36.7 C)   TempSrc: Oral Oral Oral   SpO2: 100% 100% 100%   Weight:      Height:        Intake/Output Summary (Last 24 hours) at 02/11/2024 1827 Last data filed at 02/11/2024 0442 Gross per 24 hour   Intake 600 ml  Output 500 ml  Net 100 ml   Weight change:  Exam:  General:  Pt is alert, follows commands appropriately, not in acute distress HEENT: No icterus, No thrush, No neck mass, [place]/AT Cardiovascular: RRR, S1/S2, no rubs, no gallops Respiratory: diminished BS.  No wheeze Abdomen: Soft/+BS, non tender, non distended, no guarding Extremities: No edema, No lymphangitis, No petechiae, No rashes, no synovitis   Data Reviewed: I have personally reviewed following labs and imaging studies Basic Metabolic Panel: Recent Labs  Lab 02/09/24 0743 02/10/24 0442 02/11/24 0431  NA 134* 136 140  K 3.3* 3.6 3.7  CL 104 107 110  CO2 20* 21* 20*  GLUCOSE 88 93 83  BUN 5* 6* 9  CREATININE 0.83 0.68 0.67  CALCIUM  7.7* 7.7* 8.1*  MG 1.3* 2.2  --    Liver Function Tests: No results for input(s): AST, ALT, ALKPHOS, BILITOT, PROT, ALBUMIN in the last 168 hours. No results for input(s): LIPASE, AMYLASE in the last 168 hours. No results for input(s): AMMONIA in the last 168 hours. Coagulation Profile: No results for input(s): INR, PROTIME in the last 168 hours. CBC: Recent Labs  Lab 02/09/24 0743 02/10/24 0442 02/11/24 0431  WBC 6.0 5.5 4.9  NEUTROABS 3.8  --   --   HGB 10.3* 10.5* 9.2*  HCT 30.8* 32.1* 28.1*  MCV 103.7* 103.2* 102.6*  PLT 247 280 273   Cardiac Enzymes: No results for input(s): CKTOTAL, CKMB, CKMBINDEX, TROPONINI in the last 168 hours. BNP: Invalid input(s): POCBNP CBG: No results for input(s): GLUCAP in the last 168 hours. HbA1C: No results for input(s): HGBA1C in the last 72 hours. Urine analysis:    Component Value Date/Time   COLORURINE AMBER (A) 04/09/2023 0402   APPEARANCEUR HAZY (A) 04/09/2023 0402   LABSPEC 1.016 04/09/2023 0402   PHURINE 5.0 04/09/2023 0402   GLUCOSEU >=500 (A) 04/09/2023 0402   HGBUR NEGATIVE 04/09/2023 0402   BILIRUBINUR NEGATIVE 04/09/2023 0402   KETONESUR 5 (A) 04/09/2023 0402    PROTEINUR NEGATIVE 04/09/2023 0402   UROBILINOGEN 0.2 12/17/2013 0825   NITRITE NEGATIVE 04/09/2023 0402   LEUKOCYTESUR NEGATIVE 04/09/2023 0402   Sepsis Labs: @LABRCNTIP (procalcitonin:4,lacticidven:4) )No results found for this or any previous visit (from the past 240 hours).   Scheduled Meds:  (feeding supplement) PROSource Plus  30 mL Oral TID BM   acetaminophen   650 mg Oral Q6H   Or   acetaminophen   650 mg Rectal Q6H   allopurinol   200 mg Oral Daily   carvedilol   6.25 mg Oral BID WC   docusate sodium  100 mg Oral BID   enoxaparin (LOVENOX) injection  40 mg Subcutaneous Q24H   feeding supplement  1 Container Oral TID BM   ferrous sulfate  325 mg Oral Q breakfast   folic acid   1 mg Oral Daily   loratadine   10 mg Oral Daily   magnesium  oxide  400 mg Oral BID   multivitamin with minerals  1 tablet Oral Daily   pantoprazole   40 mg Oral QPM   potassium chloride  SA  20 mEq Oral BID   sucralfate   1 g Oral TID WC & HS   thiamine   100 mg Oral Daily   Or   thiamine   100 mg Intravenous Daily   Continuous Infusions:  Procedures/Studies: CT PELVIS WO CONTRAST Result Date: 02/09/2024 CLINICAL DATA:  Pelvic fracture. Continued left hip pain after falling 5 days ago. EXAM: CT PELVIS WITHOUT CONTRAST TECHNIQUE: Multidetector CT imaging of the pelvis was performed following the standard protocol without intravenous contrast. RADIATION DOSE REDUCTION: This exam was performed according to the departmental dose-optimization program which includes automated exposure control, adjustment of the mA and/or kV according to patient size and/or use of iterative reconstruction technique. COMPARISON:  Left hip radiograph 02/09/2024.  Pelvic CTA 11/19/2023. FINDINGS: Urinary Tract: The visualized distal ureters appear unremarkable. Possible mild bladder wall thickening versus incomplete distension. The bladder is partially obscured by artifact from the left total hip arthroplasty. Bowel: No bowel wall  thickening, distention or surrounding inflammation identified within the pelvis. Mild distal colonic diverticulosis. Contrast within the lumen of the appendix. No appendiceal distention or surrounding inflammation. Vascular/Lymphatic: No enlarged pelvic lymph nodes identified. Diffuse iliofemoral atherosclerosis without evidence of aneurysm. Reproductive: The prostate gland and seminal vesicles appear unremarkable. Other: Probable left inguinal herniorrhaphy changes. No evidence of pelvic ascites or pneumoperitoneum. Musculoskeletal: Status post left total hip arthroplasty. The hardware is intact without evidence of loosening. There is no evidence of acute proximal femur fracture, dislocation or right femoral head osteonecrosis. There are acute, nondisplaced left parasymphyseal and left superior pubic ramus fractures. No other acute pelvic fractures are demonstrated. The sacroiliac joints and symphysis pubis are intact. Chronic degenerative disc disease noted at the lumbosacral junction. No evidence of significant pelvic hematoma. Underlying left pelvic muscular atrophy, likely chronic. IMPRESSION: 1. Acute, nondisplaced left parasymphyseal and left superior pubic ramus fractures. 2. No other acute pelvic fractures identified. Intact left total hip arthroplasty. 3.  Aortic Atherosclerosis (ICD10-I70.0). Electronically Signed   By: Elsie Perone M.D.   On: 02/09/2024 08:57   DG Tibia/Fibula Left Result Date: 02/09/2024 CLINICAL DATA:  Fall with worsening pain. EXAM: LEFT TIBIA AND FIBULA - 2 VIEW COMPARISON:  None Available. FINDINGS: Bones are diffusely demineralized. Deformity of the proximal fibular diaphysis suggests healed remote trauma. No evidence for an acute fracture. No worrisome lytic or sclerotic osseous abnormality. IMPRESSION: 1. No acute bony findings. 2. Deformity of the proximal fibular diaphysis suggests healed remote trauma. Electronically Signed   By: Camellia Candle M.D.   On: 02/09/2024 07:00    DG Hip Unilat W or Wo Pelvis 2-3 Views Left Result Date: 02/09/2024 CLINICAL DATA:  Fall.  Left-sided hip pain. EXAM: DG HIP (WITH OR WITHOUT PELVIS) 2-3V LEFT COMPARISON:  None Available. FINDINGS: Bones are demineralized. Status post left hip replacement. No evidence for dislocation or periprosthetic femur fracture. IMPRESSION: Status post left hip replacement without acute bony findings. Electronically Signed   By: Camellia Candle M.D.   On: 02/09/2024 06:59   CT Head Wo Contrast Result Date: 02/04/2024 CLINICAL DATA:  Provided history: Head trauma, minor. Facial trauma, blunt. Additional history provided: Syncopal episode. EXAM: CT HEAD WITHOUT CONTRAST CT MAXILLOFACIAL WITHOUT CONTRAST TECHNIQUE: Multidetector CT imaging of the head and maxillofacial structures were performed using the standard protocol without intravenous contrast. Multiplanar CT image reconstructions of the maxillofacial structures were also  generated. RADIATION DOSE REDUCTION: This exam was performed according to the departmental dose-optimization program which includes automated exposure control, adjustment of the mA and/or kV according to patient size and/or use of iterative reconstruction technique. COMPARISON:  Head CT 04/11/2023. FINDINGS: CT HEAD FINDINGS Brain: Generalized cerebral atrophy. Patchy and ill-defined hypoattenuation within the cerebral white matter, nonspecific but compatible with mild chronic small vessel ischemic disease. There is no acute intracranial hemorrhage. No demarcated cortical infarct. No extra-axial fluid collection. No evidence of an intracranial mass. No midline shift. Vascular: No hyperdense vessel.  Atherosclerotic calcifications. Skull: No calvarial fracture or aggressive osseous lesion. Other: Sizable forehead and periorbital hematoma on the left. Smaller left facial hematoma. CT MAXILLOFACIAL FINDINGS Osseous: Redemonstrated chronic medially displaced lamina papyracea fractures bilaterally.  Displaced left nasal bone fracture, new from the prior head CT of 04/11/2023 but otherwise age-indeterminate. Orbits: Sizable forehead and periorbital hematoma on the left. The globes are normal in size and contour. No retrobulbar hematoma. Sinuses: Trace secretions within the bilateral maxillary sinuses. Minimal mucosal thickening scattered within bilateral ethmoid air cells. Soft tissues: Sizable forehead and periorbital hematoma on the left. Other: Incompletely assessed cervical spondylosis. Nonspecific reversal of the expected cervical lordosis. Grade 1 anterolisthesis at C2-C3, C3-C4 and C4-C5. Grade 1 retrolisthesis at C5-C6 and C6-C7. IMPRESSION: CT head: 1.  No evidence of an acute intracranial abnormality. 2. Parenchymal atrophy and chronic small vessel ischemic disease. 3. Sizable forehead and periorbital hematoma on the left. CT maxillofacial: 1. Displaced left nasal bone fracture, new from the prior head CT of 04/11/2023 but otherwise age-indeterminate. 2. Redemonstrated chronic medially displaced lamina papyracea fractures bilaterally. 3. Sizable forehead and periorbital hematoma on the left. 4. Smaller left facial hematoma. 5. Minor paranasal sinus disease as described. 6. Nonspecific reversal of the expected cervical lordosis. 7. Grade 1 spondylolisthesis at C2-C3, C3-C4, C4-C5, C5-C6 and C6-C7. 8. Incompletely assessed cervical spondylosis. Electronically Signed   By: Rockey Childs D.O.   On: 02/04/2024 09:09   CT Maxillofacial WO CM Result Date: 02/04/2024 CLINICAL DATA:  Provided history: Head trauma, minor. Facial trauma, blunt. Additional history provided: Syncopal episode. EXAM: CT HEAD WITHOUT CONTRAST CT MAXILLOFACIAL WITHOUT CONTRAST TECHNIQUE: Multidetector CT imaging of the head and maxillofacial structures were performed using the standard protocol without intravenous contrast. Multiplanar CT image reconstructions of the maxillofacial structures were also generated. RADIATION DOSE  REDUCTION: This exam was performed according to the departmental dose-optimization program which includes automated exposure control, adjustment of the mA and/or kV according to patient size and/or use of iterative reconstruction technique. COMPARISON:  Head CT 04/11/2023. FINDINGS: CT HEAD FINDINGS Brain: Generalized cerebral atrophy. Patchy and ill-defined hypoattenuation within the cerebral white matter, nonspecific but compatible with mild chronic small vessel ischemic disease. There is no acute intracranial hemorrhage. No demarcated cortical infarct. No extra-axial fluid collection. No evidence of an intracranial mass. No midline shift. Vascular: No hyperdense vessel.  Atherosclerotic calcifications. Skull: No calvarial fracture or aggressive osseous lesion. Other: Sizable forehead and periorbital hematoma on the left. Smaller left facial hematoma. CT MAXILLOFACIAL FINDINGS Osseous: Redemonstrated chronic medially displaced lamina papyracea fractures bilaterally. Displaced left nasal bone fracture, new from the prior head CT of 04/11/2023 but otherwise age-indeterminate. Orbits: Sizable forehead and periorbital hematoma on the left. The globes are normal in size and contour. No retrobulbar hematoma. Sinuses: Trace secretions within the bilateral maxillary sinuses. Minimal mucosal thickening scattered within bilateral ethmoid air cells. Soft tissues: Sizable forehead and periorbital hematoma on the left. Other: Incompletely assessed cervical spondylosis.  Nonspecific reversal of the expected cervical lordosis. Grade 1 anterolisthesis at C2-C3, C3-C4 and C4-C5. Grade 1 retrolisthesis at C5-C6 and C6-C7. IMPRESSION: CT head: 1.  No evidence of an acute intracranial abnormality. 2. Parenchymal atrophy and chronic small vessel ischemic disease. 3. Sizable forehead and periorbital hematoma on the left. CT maxillofacial: 1. Displaced left nasal bone fracture, new from the prior head CT of 04/11/2023 but otherwise  age-indeterminate. 2. Redemonstrated chronic medially displaced lamina papyracea fractures bilaterally. 3. Sizable forehead and periorbital hematoma on the left. 4. Smaller left facial hematoma. 5. Minor paranasal sinus disease as described. 6. Nonspecific reversal of the expected cervical lordosis. 7. Grade 1 spondylolisthesis at C2-C3, C3-C4, C4-C5, C5-C6 and C6-C7. 8. Incompletely assessed cervical spondylosis. Electronically Signed   By: Rockey Childs D.O.   On: 02/04/2024 09:09    Alm Schneider, DO  [facility]  If 7PM-7AM, please contact night-coverage [email or web address] Password [email or web address] 02/11/2024, 6:27 PM   LOS: 2 days

## 2024-02-11 NOTE — TOC Progression Note (Signed)
 Transition of Care [facility]) - Progression Note    Patient Details  Name: Jonathan Benitez MRN: [id number] Date of Birth: 1950-08-17  Transition of [facility]) CM/SW Contact  Lucie Lunger, [place] Phone Number: 02/11/2024, 2:19 PM  Clinical Narrative:    CSW spoke with pt at bedside, pt states he feels much stronger and prefers to return home with [facility] and a rolling walker. CSW sent request for walker to [facility] who services pts needs. [facility] [place] states this can take a couple days and will be delivered to the home. Pt is understanding. HH arranged with [facility], orders in at this time. TOC to follow.   Expected Discharge Plan: Home w Home Health Services Barriers to Discharge: Continued Medical Work up               Expected Discharge Plan and Services In-house Referral: Clinical Social Work Discharge Planning Services: CM Consult Post Acute Care Choice: Home Health Living arrangements for the past 2 months: Single Family Home                 DME Arranged: Walker rolling DME Agency: [employer]       HH Arranged: PT, OT           Social Drivers of Health (SDOH) Interventions SDOH Screenings   Food Insecurity: No Food Insecurity (02/09/2024)  Housing: Low Risk  (02/09/2024)  Transportation Needs: No Transportation Needs (02/09/2024)  Utilities: Not At Risk (02/09/2024)  Depression (PHQ2-9): Low Risk  (08/05/2023)  Financial Resource Strain: Medium Risk (03/26/2021)  Physical Activity: Insufficiently Active (03/26/2021)  Social Connections: Socially Isolated (02/09/2024)  Stress: No Stress Concern Present (03/26/2021)  Tobacco Use: Low Risk  (02/09/2024)    Readmission Risk Interventions    02/09/2024    3:50 PM 11/08/2022    9:32 AM  Readmission Risk Prevention Plan  Transportation Screening Complete Complete  HRI or Home Care Consult Complete Complete  Social Work Consult for Recovery Care Planning/Counseling Complete Complete  Palliative Care Screening Not  Applicable Not Applicable  Medication Review Oceanographer) Complete Complete

## 2024-02-11 NOTE — Progress Notes (Signed)
 Nutrition Follow-up  DOCUMENTATION CODES:   Not applicable  INTERVENTION:   -Continue regular diet -30 ml Prosource Plus TID, each supplement provides 100 kcals and 15 grams protein -Continue 1 mg folic acid  daily -Continue MVI with minerals daily  -Continue 100 mg thiamine  daily -Continue Magic cup TID with meals, each supplement provides 290 kcal and 9 grams of protein  -Boost Breeze po TID, each supplement provides 250 kcal and 9 grams of protein   NUTRITION DIAGNOSIS:   Increased nutrient needs related to chronic illness (CHF) as evidenced by estimated needs.  Ongoing  GOAL:   Patient will meet greater than or equal to 90% of their needs  Progressing   MONITOR:   PO intake, Supplement acceptance  REASON FOR ASSESSMENT:   Malnutrition Screening Tool    ASSESSMENT:   Pt with past medical history of chronic alcoholism, gout, recreational substance use, hyperlipidemia, protein calorie malnutrition, history of prostate cancer, diastolic heart failure, GERD, Crohn's disease, chronic mesenteric ischemia, chronic diarrhea, poor appetite, failure to thrive, who was treated on 02/04/2024 after presenting with a syncopal episode and head injury after a fall.  He was treated for a facial soft tissue contusion of the face and found to have fracture of the nasal bone.  At the time he was able to feel better after treatment and was ambulatory without difficulty.  He reports that after going home he is increasing pain on the left sided area of his hip.  Has been getting worse.  Is affecting him being able to ambulate and take care of himself at home.  He reports that the pain has become uncontrolled and he ended up calling EMS who brought him back into the emergency department.  He received a CT scan of his pelvis with findings of acute, nondisplaced left parasymphyseal and left superior pubic  ramus fractures but no other fracture found with intact left total hip arthroplasty.  He was  given treatment in ED but has been unable to get up due to uncontrolled pain.  He had findings of dehydration and  hypokalemia from poor oral intake at home unable to access food and water  properly.  Reviewed I/O's: -370 ml x 24 hours and -1.1 L since admission  UOP: 1.5 L x 24 hours  Pt unavailable at time of visit. Attempted to speak with pt via call to hospital room phone, however, unable to reach. RD unable to obtain further nutrition-related history or complete nutrition-focused physical exam at this time.    Case discussed with RN. Intake is poor and pt had been refusing meals yesterday. Noted pt consumed 80% of dinner last night.   No new wt since last visit.   Per TOC notes, pt refusing SNF and now wants to discharge home with home health services.   Medications reviewed and include folic acid , colace, ferrous sulfate, MVI, magnesium  oxide, potassium chloride , and thiamine .   Labs reviewed.    Diet Order:   Diet Order             Diet regular Fluid consistency: Thin  Diet effective now                   EDUCATION NEEDS:   No education needs have been identified at this time  Skin:  Skin Assessment: Reviewed RN Assessment  Last BM:  02/09/24  Height:   Ht Readings from Last 1 Encounters:  02/09/24 6' (1.829 m)    Weight:   Wt Readings from Last 1  Encounters:  02/09/24 84 kg    Ideal Body Weight:  80.9 kg  BMI:  Body mass index is 25.12 kg/m.  Estimated Nutritional Needs:   Kcal:  2200-2400  Protein:  120-135 grams  Fluid:  2.0-2.2 L    Margery ORN, RD, LDN, CDCES Registered Dietitian III Certified Diabetes Care and Education Specialist If unable to reach this RD, please use RD Inpatient group chat on secure chat between hours of 8am-4 pm daily

## 2024-02-11 NOTE — Progress Notes (Signed)
 Nurse at bedside,patient is alert and oriented times four.Patient has no c/o pain or discomfort noted at this time.Very pleasant.Patient did ask about a front wheel walker this am,I will follow up with TOC,and the social worker regarding further needs. Plan of care on going.

## 2024-02-11 NOTE — Plan of Care (Signed)
   Problem: Education: Goal: Knowledge of General Education information will improve Description Including pain rating scale, medication(s)/side effects and non-pharmacologic comfort measures Outcome: Progressing   Problem: Education: Goal: Knowledge of General Education information will improve Description Including pain rating scale, medication(s)/side effects and non-pharmacologic comfort measures Outcome: Progressing

## 2024-02-12 LAB — CBC
HCT: 30.9 % — ABNORMAL LOW (ref 39.0–52.0)
Hemoglobin: 10.1 g/dL — ABNORMAL LOW (ref 13.0–17.0)
MCH: 34.4 pg — ABNORMAL HIGH (ref 26.0–34.0)
MCHC: 32.7 g/dL (ref 30.0–36.0)
MCV: 105.1 fL — ABNORMAL HIGH (ref 80.0–100.0)
Platelets: 340 K/uL (ref 150–400)
RBC: 2.94 MIL/uL — ABNORMAL LOW (ref 4.22–5.81)
RDW: 13.7 % (ref 11.5–15.5)
WBC: 6.2 K/uL (ref 4.0–10.5)
nRBC: 0 % (ref 0.0–0.2)

## 2024-02-12 LAB — BASIC METABOLIC PANEL WITH GFR
Anion gap: 12 (ref 5–15)
BUN: 10 mg/dL (ref 8–23)
CO2: 20 mmol/L — ABNORMAL LOW (ref 22–32)
Calcium: 8.8 mg/dL — ABNORMAL LOW (ref 8.9–10.3)
Chloride: 108 mmol/L (ref 98–111)
Creatinine, Ser: 0.73 mg/dL (ref 0.61–1.24)
GFR, Estimated: >60 mL/min (ref >60)
Glucose, Bld: 86 mg/dL (ref 70–99)
Potassium: 4.2 mmol/L (ref 3.5–5.1)
Sodium: 139 mmol/L (ref 135–145)

## 2024-02-12 LAB — TSH: TSH: 2.87 u[IU]/mL (ref 0.350–4.500)

## 2024-02-12 LAB — VITAMIN B12: Vitamin B-12: 634 pg/mL (ref 180–914)

## 2024-02-12 LAB — T4, FREE: Free T4: 0.91 ng/dL (ref 0.61–1.12)

## 2024-02-12 LAB — FOLATE: Folate: 11.7 ng/mL (ref >5.9)

## 2024-02-12 MED ORDER — OXYCODONE HCL 5 MG PO TABS: 5.0000 mg | ORAL_TABLET | Freq: Four times a day (QID) | ORAL | 0 refills | Status: AC | PRN

## 2024-02-12 NOTE — TOC Transition Note (Signed)
 Transition of Care [facility]) - Discharge Note   Patient Details  Name: Jonathan Benitez MRN: [id number] Date of Birth: 1950-12-14  Transition of Care [facility]) CM/SW Contact:  Lucie Lunger, LCSWA Phone Number: 02/12/2024, 3:28 PM  Clinical Narrative:    CSW notes that rollator was provided by [employer], pt can now safely D/C home with [facility] services. HH PT/OT orders placed. CSW updated Selinda with [facility] that pt will D/C home today. TOC signing off.   Final next level of care: Home w Home Health Services Barriers to Discharge: Barriers Resolved   Patient Goals and CMS Choice Patient states their goals for this hospitalization and ongoing recovery are:: return home CMS Medicare.gov Compare Post Acute Care list provided to:: Patient Choice offered to / list presented to : Patient      Discharge Placement                       Discharge Plan and Services Additional resources added to the After Visit Summary for   In-house Referral: Clinical Social Work Discharge Planning Services: CM Consult Post Acute Care Choice: Home Health          DME Arranged: [facility] DME Agency: [facility] [place] Date DME Agency Contacted: 02/11/24     HH Arranged: OT, PT HH Agency: [facility] Date HH Agency Contacted: 02/12/24   Representative spoke with at [facility] Agency: Selinda  Social Drivers of Health (SDOH) Interventions SDOH Screenings   Food Insecurity: No Food Insecurity (02/09/2024)  Housing: Low Risk  (02/09/2024)  Transportation Needs: No Transportation Needs (02/09/2024)  Utilities: Not At Risk (02/09/2024)  Depression (PHQ2-9): Low Risk  (08/05/2023)  Financial Resource Strain: Medium Risk (03/26/2021)  Physical Activity: Insufficiently Active (03/26/2021)  Social Connections: Socially Isolated (02/09/2024)  Stress: No Stress Concern Present (03/26/2021)  Tobacco Use: Low Risk  (02/09/2024)     Readmission Risk Interventions    02/12/2024    3:27  PM 02/09/2024    3:50 PM 11/08/2022    9:32 AM  Readmission Risk Prevention Plan  Transportation Screening Complete Complete Complete  HRI or Home Care Consult  Complete Complete  Social Work Consult for Recovery Care Planning/Counseling  Complete Complete  Palliative Care Screening  Not Applicable Not Applicable  Medication Review Oceanographer) Complete Complete Complete  HRI or Home Care Consult Complete    SW Recovery Care/Counseling Consult Complete    Palliative Care Screening Not Applicable    Skilled Nursing Facility Patient Refused

## 2024-02-12 NOTE — Progress Notes (Signed)
 Occupational Therapy Treatment Patient Details Name: Jonathan Benitez MRN: [id number] DOB: 07-06-1950 Today's Date: 02/12/2024   History of present illness Jonathan Benitez is a 73 year old male who was living alone, past medical history of chronic alcoholism, gout, recreational substance use, hyperlipidemia, protein calorie malnutrition, history of prostate cancer, diastolic heart failure, GERD, Crohn's disease, chronic mesenteric ischemia, chronic diarrhea, poor appetite, failure to thrive, who was treated in the ED on 02/04/2024 after presenting with a syncopal episode and head injury after a fall.  He was treated for a facial soft tissue contusion of the face and found to have fracture of the nasal bone.  At the time he was able to feel better after treatment and was ambulatory without difficulty.  He reports that after going home he is increasing pain on the left sided area of his hip.  Has been getting worse.  Is affecting him being able to ambulate and take care of himself at home.  He reports that the pain has become uncontrolled and he ended up calling EMS who brought him back into the emergency department.  He received a CT scan of his pelvis with findings of acute, nondisplaced left parasymphyseal and left superior pubic  ramus fractures but no other fracture found with intact left total hip arthroplasty.  He was given treatment in ED but has been unable to get up due to uncontrolled pain.  He had findings of dehydration and  hypokalemia from poor oral intake at home unable to access food and water  properly.  Admission was requested for pain management and therapy evaluations.   OT comments  Pt agreeable to OT treatment. Pt reports that he is set on going home. Pt able to ambulate in the room using the RW with supervision. Pt able to complete sit to stand from toilet without physical assist but mild labored effort without use of the grab bar. Pt educated on [facility] and tub bench being beneficial pieces  of equipment to have if the pt goes home. Pt left in the bed with call bell within reach and bed alarm set. Pt will benefit from continued OT in the hospital to increase strength, balance, and endurance for safe ADL's.         If plan is discharge home, recommend the following:  A little help with walking and/or transfers;A little help with bathing/dressing/bathroom;Assistance with cooking/housework;Assist for transportation;Help with stairs or ramp for entrance   Equipment Recommendations  BSC/3in1;Tub/shower bench (Pt given handout with these two pieces of DME highlighted with education on how they could be used to improve function at home.)          Precautions / Restrictions Precautions Precautions: Fall Recall of Precautions/Restrictions: Intact Restrictions Weight Bearing Restrictions Per Provider Order: No       Mobility Bed Mobility Overal bed mobility: Modified Independent             General bed mobility comments: HOB elevated    Transfers Overall transfer level: Needs assistance Equipment used: Rolling walker (2 wheels) Transfers: Sit to/from Stand Sit to Stand: Supervision           General transfer comment: No physical assist needed for sit to stand followed by ambulation with RW.     Balance Overall balance assessment: Needs assistance Sitting-balance support: Feet supported, No upper extremity supported Sitting balance-Leahy Scale: Good Sitting balance - Comments: seated at EOB   Standing balance support: Reliant on assistive device for balance, During functional activity, Bilateral upper extremity supported  Standing balance-Leahy Scale: Fair Standing balance comment: using RW                           ADL either performed or assessed with clinical judgement   ADL Overall ADL's : Needs assistance/impaired     Grooming: Standing;Supervision/safety Grooming Details (indicate cue type and reason): Able to wash hands while standing  at the sink with supervision for safety.                 Toilet Transfer: Supervision/safety;Rolling walker (2 wheels);Ambulation Toilet Transfer Details (indicate cue type and reason): EOB to toilet and back.         Functional mobility during ADLs: Supervision/safety;Rolling walker (2 wheels) General ADL Comments: Able to ambualte in the room with RW and supervision.     Communication Communication Communication: No apparent difficulties   Cognition Arousal: Alert Behavior During Therapy: WFL for tasks assessed/performed Cognition: No apparent impairments                               Following commands: Intact        Cueing   Cueing Techniques: Verbal cues               General Comments Given handout on DME/ AE that will be beneficial if the pt goes home as he reports he wants to do. Pt reported having a long handled sponge and a reacher.    Pertinent Vitals/ Pain       Pain Assessment Pain Assessment: Faces Faces Pain Scale: Hurts little more Pain Location: L groin area Pain Descriptors / Indicators: Discomfort Pain Intervention(s): Monitored during session, Repositioned                                                          Frequency  Min 2X/week        Progress Toward Goals  OT Goals(current goals can now be found in the care plan section)  Progress towards OT goals: Progressing toward goals  Acute Rehab OT Goals Patient Stated Goal: improve function OT Goal Formulation: With patient Time For Goal Achievement: 02/23/24 Potential to Achieve Goals: Good ADL Goals Pt Will Perform Grooming: standing;Independently Pt Will Perform Lower Body Bathing: Independently Pt Will Perform Lower Body Dressing: Independently Pt Will Transfer to Toilet: Independently;ambulating Pt Will Perform Toileting - Clothing Manipulation and hygiene: Independently;sit to/from stand;sitting/lateral leans  Plan                                       End of Session Equipment Utilized During Treatment: Rolling walker (2 wheels)  OT Visit Diagnosis: Unsteadiness on feet (R26.81);Other abnormalities of gait and mobility (R26.89);Muscle weakness (generalized) (M62.81);History of falling (Z91.81);Repeated falls (R29.6)   Activity Tolerance Patient tolerated treatment well   Patient Left in bed;with call bell/phone within reach;with bed alarm set   Nurse Communication          Time: 8541-8486 OT Time Calculation (min): 15 min  Charges: OT General Charges $OT Visit: 1 Visit OT Treatments $Self Care/Home Management : 8-22 mins  Mukesh Kornegay OT, MOT  Jayson Person 02/12/2024,  3:30 PM

## 2024-02-12 NOTE — Discharge Instructions (Signed)
 Re[facility] out to your [facility] or the [facility] to get a bedside commode and tub bench set up as they will need to be supplied through the [place].

## 2024-02-12 NOTE — Progress Notes (Signed)
 Physical Therapy Treatment Patient Details Name: Jonathan Benitez MRN: [id number] DOB: 02-10-1951 Today's Date: 02/12/2024   History of Present Illness Jonathan Benitez is a 73 year old male who was living alone, past medical history of chronic alcoholism, gout, recreational substance use, hyperlipidemia, protein calorie malnutrition, history of prostate cancer, diastolic heart failure, GERD, Crohn's disease, chronic mesenteric ischemia, chronic diarrhea, poor appetite, failure to thrive, who was treated in the ED on 02/04/2024 after presenting with a syncopal episode and head injury after a fall.  He was treated for a facial soft tissue contusion of the face and found to have fracture of the nasal bone.  At the time he was able to feel better after treatment and was ambulatory without difficulty.  He reports that after going home he is increasing pain on the left sided area of his hip.  Has been getting worse.  Is affecting him being able to ambulate and take care of himself at home.  He reports that the pain has become uncontrolled and he ended up calling EMS who brought him back into the emergency department.  He received a CT scan of his pelvis with findings of acute, nondisplaced left parasymphyseal and left superior pubic  ramus fractures but no other fracture found with intact left total hip arthroplasty.  He was given treatment in ED but has been unable to get up due to uncontrolled pain.  He had findings of dehydration and  hypokalemia from poor oral intake at home unable to access food and water  properly.  Admission was requested for pain management and therapy evaluations.    PT Comments  Pt was agreeable to completing today's PT treatment. Today's treatment focused on functional mobility and ambulation. He was able to demonstrate a step through gait pattern while ambulating with a RW.  He was independent with supine to sit and sit to supine transfers. His exercises were reviewed and he exhibited  good recall of these interventions. He was left in bed with the call bell within reach, bed alarm set, and RN notified. Patient will benefit from continued skilled physical therapy in hospital and recommended venue below to increase strength, balance, endurance for safe ADLs and gait.    If plan is discharge home, recommend the following: A little help with bathing/dressing/bathroom;Assistance with cooking/housework;Assist for transportation;Help with stairs or ramp for entrance;A little help with walking and/or transfers   Can travel by private vehicle     Yes  Equipment Recommendations  None recommended by PT    Recommendations for Other Services       Precautions / Restrictions Precautions Precautions: Fall Recall of Precautions/Restrictions: Intact Restrictions Weight Bearing Restrictions Per Provider Order: No     Mobility  Bed Mobility Overal bed mobility: Independent             General bed mobility comments: WFL with supine to sit and sit to supine transfers    Transfers Overall transfer level: Needs assistance Equipment used: Rolling walker (2 wheels) Transfers: Sit to/from Stand Sit to Stand: Supervision   Step pivot transfers: Supervision            Ambulation/Gait Ambulation/Gait assistance: Supervision Gait Distance (Feet): 50 Feet Assistive device: Rolling walker (2 wheels) Gait Pattern/deviations: Step-through pattern, Decreased stride length, Antalgic Gait velocity: decreased         Stairs             Wheelchair Mobility     Tilt Bed    Modified Rankin (Stroke Patients Only)  Balance Overall balance assessment: Needs assistance Sitting-balance support: Feet supported, No upper extremity supported Sitting balance-Leahy Scale: Good Sitting balance - Comments: seated at EOB   Standing balance support: Reliant on assistive device for balance, During functional activity, Bilateral upper extremity supported Standing  balance-Leahy Scale: Fair Standing balance comment: using RW                            Communication Communication Communication: No apparent difficulties  Cognition Arousal: Alert Behavior During Therapy: WFL for tasks assessed/performed   PT - Cognitive impairments: No apparent impairments                         Following commands: Intact      Cueing Cueing Techniques: Verbal cues  Exercises General Exercises - Lower Extremity Long Arc Quad: AROM, Both, 10 reps, Seated Hip Flexion/Marching: AROM, Both, 10 reps, Seated Toe Raises: Both, 10 reps, Seated Heel Raises: Both, 10 reps, Seated    General Comments        Pertinent Vitals/Pain Pain Assessment Pain Assessment: 0-10 Pain Score: 8  Pain Location: L groin area Pain Descriptors / Indicators: Sharp Pain Intervention(s): Limited activity within patient's tolerance, Monitored during session, Repositioned    Home Living                          Prior Function            PT Goals (current goals can now be found in the care plan section) Acute Rehab PT Goals Patient Stated Goal: pt would like to return home. PT Goal Formulation: With patient Time For Goal Achievement: 02/16/24 Potential to Achieve Goals: Good Progress towards PT goals: Progressing toward goals    Frequency    Min 3X/week      PT Plan      Co-evaluation              AM-PAC PT 6 Clicks Mobility   Outcome Measure  Help needed turning from your back to your side while in a flat bed without using bedrails?: None Help needed moving from lying on your back to sitting on the side of a flat bed without using bedrails?: None Help needed moving to and from a bed to a chair (including a wheelchair)?: A Little Help needed standing up from a chair using your arms (e.g., wheelchair or bedside chair)?: None Help needed to walk in hospital room?: A Little Help needed climbing 3-5 steps with a railing? : A  Lot 6 Click Score: 20    End of Session Equipment Utilized During Treatment: Gait belt Activity Tolerance: Patient tolerated treatment well Patient left: in bed;with call bell/phone within reach;with bed alarm set Nurse Communication: Mobility status;Precautions PT Visit Diagnosis: Other abnormalities of gait and mobility (R26.89);Muscle weakness (generalized) (M62.81);History of falling (Z91.81);Unsteadiness on feet (R26.81)     Time: 8947-8892 PT Time Calculation (min) (ACUTE ONLY): 15 min  Charges:    $Therapeutic Activity: 8-22 mins PT General Charges $$ ACUTE PT VISIT: 1 Visit                     Lacinda Fass, PT, DPT  02/12/2024, 11:31 AM

## 2024-02-12 NOTE — Discharge Summary (Signed)
 Physician Discharge Summary   Patient: Jonathan Benitez MRN: [id number] DOB: 11/10/1950  Admit date:     02/09/2024  Discharge date: 02/12/24  Discharge Physician: Alm Lois Slagel   PCP: Tobie Suzzane POUR, MD   Recommendations at discharge:   Please follow up with primary care provider within 1-2 weeks  Please repeat BMP and CBC in one week     Hospital Course: 73 year old male who was living alone, past medical history of chronic alcoholism, gout, recreational substance use, hyperlipidemia, protein calorie malnutrition, history of prostate cancer, diastolic heart failure, GERD, Crohn's disease, chronic mesenteric ischemia, chronic diarrhea, poor appetite, failure to thrive, who was treated in the ED on 02/04/2024 after presenting with a syncopal episode and head injury after a fall.  He was treated for a facial soft tissue contusion of the face and found to have fracture of the nasal bone.  At the time he was able to feel better after treatment and was ambulatory without difficulty.  He reports that after going home he is increasing pain on the left sided area of his hip.  Has been getting worse.  Is affecting him being able to ambulate and take care of himself at home.  He reports that the pain has become uncontrolled and he ended up calling EMS who brought him back into the emergency department.  He received a CT scan of his pelvis with findings of acute, nondisplaced left parasymphyseal and left superior pubic ramus fractures but no other fracture found with intact left total hip arthroplasty.  He was given treatment in ED but has been unable to get up due to uncontrolled pain.  He had findings of dehydration and  hypokalemia from poor oral intake at home unable to access food and water  properly.  Admission was requested for pain management and therapy evaluations.   The patient was initially started on IV opioids.  He was transition to oral opioids.  As time passed, the patient's pelvic pain gradually  improved.  He worked with physical therapy.  SNF was initially recommended, but the patient wanted to go home.  He made gradual improvements with physical therapy.  TOC assisted in obtaining a rollator for the patient for home use prior to discharge.  At time of discharge, the patient did not require any IV opioids.  His pain was controlled with taking just once daily oxycodone .  Assessment and Plan: Acute left pubic ramus fractures -- from recent falls at home  -- Fortunately he is having improving pain and immobility -- Admitting for pain management and therapy evaluation -- PT/OT evaluation requested--recommending SNF  -- pt refuses SNF --TOC assisted with obtaining rollator for patient prior to discharge   Uncontrolled pain -- improving -- continue scheduled acetaminophen  around the clock -- oral oxycodone  as needed for moderate pain -- IV fentanyl  for severe pain and prior to therapy sessions   Chronic diastolic heart failure -- clinically compensated -- 04/08/23 Echo EF 50-55%, +WMA, G1DD -- dc IVF as he is eating/drinking better     Dehydration -- treated -- IV fluids ordered and completed    Hyponatremia -- resolved  -- secondary to dehydration -- treating with IV fluids>>improved   Hypokalemia  -- repleted -- added potassium to IV fluids   Hypomagnesemia  -- repleted   Alcohol dependence -- Patient reporting he is drinking 3 beers per day -- Added CIWA protocol to avoid acute alcohol withdrawal -- no signs of withdrawal   Macrocytic anemia Iron deficiency -- continue daily  iron supplement -- B12 (668), folic acid  (10.4) levels are reassuring   History of recreational substance use -- Patient noted recent use of marijuana -- Check urine toxicology screen-+THC   Adult Failure to thrive -- PT/OT evaluation--pt refused SNF -- B12--624 -- folate--11.7 --TSH--2.870   GERD -- pantoprazole  ordered   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   carvedilol  6.25 MG tablet Commonly known as: COREG  Take 6.25 mg by mouth 2 (two) times daily with a meal.   cetirizine 10 MG tablet Commonly known as: ZYRTEC Take 10 mg by mouth daily.   diclofenac  Sodium 1 % Gel Commonly known as: VOLTAREN  Apply 4 g topically 4 (four) times daily.   esomeprazole  40 MG capsule Commonly known as: NexIUM  Take 1 capsule (40 mg total) by mouth daily at 12 noon.   fluticasone  50 MCG/ACT nasal spray Commonly known as: FLONASE  Place 2 sprays into both nostrils daily as needed for allergies.   magnesium  oxide 400 (240 Mg) MG tablet Commonly known as: MAG-OX Take 1 tablet (400 mg total) by mouth 2 (two) times daily.   oxyCODONE  5 MG immediate release tablet Commonly known as: Oxy IR/ROXICODONE  Take 1 tablet (5 mg total) by mouth every 6 (six) hours as needed for moderate pain (pain score 4-6).   potassium chloride  SA 20 MEQ tablet Commonly known as: KLOR-CON  M Take 1 tablet (20 mEq total) by mouth daily. What changed: when to take this   sucralfate  1 g tablet Commonly known as: Carafate  Take 1 tablet (1 g total) by mouth 4 (four) times daily -  with meals and at bedtime.   traZODone  50 MG tablet Commonly known as: DESYREL  Take 100 mg by mouth at bedtime as needed for sleep. for sleep   triamcinolone ointment 0.1 % Commonly known as: KENALOG Apply 1 Application topically 4 (four) times daily as needed (itching).               Durable Medical Equipment  (From admission,  onward)           Start     Ordered   02/09/24 1554  For home use only DME Walker rolling  Once       Question Answer Comment  Walker: With 5 Inch Wheels   Patient needs a walker to treat with the following condition Gait instability      02/09/24 1554            Follow-up Information     [facility]  Follow up.   Why: Agency will reach out to set up first home therapy visit. Contact information: [address] [place] [address] [phone]                Discharge Exam: Filed Weights   02/09/24 0600 02/09/24 1109  Weight: 84 kg 84 kg   HEENT:  [place]/AT, No thrush, no icterus CV:  RRR, no rub, no S3, no S4  Lung:  CTA, no wheeze, no rhonchi Abd:  soft/+BS, NT Ext:  No edema, no lymphangitis, no synovitis, no rash   Condition at discharge: stable  The results of significant diagnostics from this hospitalization (including imaging, microbiology, ancillary and laboratory) are listed below for reference.   Imaging Studies: CT PELVIS WO CONTRAST Result Date: 02/09/2024 CLINICAL DATA:  Pelvic fracture. Continued left hip pain after falling 5 days ago. EXAM: CT PELVIS WITHOUT CONTRAST TECHNIQUE: Multidetector CT imaging of the pelvis was performed following the standard protocol without intravenous contrast. RADIATION DOSE REDUCTION: This exam was performed according to the departmental dose-optimization program which includes automated exposure control, adjustment of the mA and/or kV according to patient size and/or use of iterative reconstruction technique. COMPARISON:  Left hip radiograph 02/09/2024.  Pelvic CTA 11/19/2023. FINDINGS: Urinary Tract: The visualized distal ureters appear unremarkable. Possible mild bladder wall thickening versus incomplete distension. The bladder is partially obscured by artifact from the left total hip arthroplasty. Bowel: No bowel wall thickening, distention or surrounding inflammation identified within the  pelvis. Mild distal colonic diverticulosis. Contrast within the lumen of the appendix. No appendiceal distention or surrounding inflammation. Vascular/Lymphatic: No enlarged pelvic lymph nodes identified. Diffuse iliofemoral atherosclerosis without evidence of aneurysm. Reproductive: The prostate gland and seminal vesicles appear unremarkable. Other: Probable left inguinal herniorrhaphy changes. No evidence of pelvic ascites or pneumoperitoneum. Musculoskeletal: Status post left total hip arthroplasty. The hardware is intact without evidence of loosening. There is no evidence of acute proximal femur fracture, dislocation or right femoral head osteonecrosis. There are acute, nondisplaced left parasymphyseal and left superior pubic ramus fractures. No other acute pelvic fractures are demonstrated. The sacroiliac joints and symphysis pubis are intact. Chronic degenerative disc disease noted at the lumbosacral junction. No evidence of significant pelvic hematoma. Underlying left pelvic muscular atrophy, likely chronic. IMPRESSION: 1. Acute, nondisplaced left parasymphyseal and left superior pubic ramus fractures. 2. No other acute pelvic fractures identified. Intact left total hip arthroplasty. 3.  Aortic Atherosclerosis (ICD10-I70.0). Electronically Signed   By: Elsie Perone M.D.   On: 02/09/2024 08:57   DG Tibia/Fibula Left Result Date: 02/09/2024 CLINICAL DATA:  Fall with worsening pain. EXAM: LEFT TIBIA AND FIBULA - 2 VIEW COMPARISON:  None Available. FINDINGS: Bones are diffusely demineralized. Deformity of the proximal fibular diaphysis suggests healed remote trauma. No evidence for an acute fracture. No worrisome lytic or sclerotic osseous abnormality. IMPRESSION: 1. No acute bony findings. 2. Deformity of the proximal fibular diaphysis suggests healed remote trauma. Electronically Signed   By: Camellia Candle M.D.   On: 02/09/2024 07:00   DG Hip Unilat W or Wo Pelvis 2-3 Views Left Result Date:  02/09/2024 CLINICAL DATA:  Fall.  Left-sided hip pain. EXAM: DG HIP (WITH OR WITHOUT PELVIS) 2-3V LEFT COMPARISON:  None Available. FINDINGS: Bones are demineralized. Status post left hip replacement. No evidence for dislocation or periprosthetic femur fracture. IMPRESSION: Status post left hip replacement without acute bony findings. Electronically Signed   By: Camellia Candle M.D.   On: 02/09/2024 06:59   CT Head Wo Contrast Result Date: 02/04/2024 CLINICAL DATA:  Provided history: Head trauma, minor. Facial trauma, blunt. Additional history provided: Syncopal episode. EXAM: CT HEAD WITHOUT CONTRAST CT MAXILLOFACIAL WITHOUT CONTRAST TECHNIQUE: Multidetector CT imaging of the head and maxillofacial structures were performed using the standard protocol without intravenous contrast. Multiplanar CT image reconstructions of the maxillofacial structures were also generated. RADIATION DOSE REDUCTION: This exam was performed according to the departmental dose-optimization program which includes automated  exposure control, adjustment of the mA and/or kV according to patient size and/or use of iterative reconstruction technique. COMPARISON:  Head CT 04/11/2023. FINDINGS: CT HEAD FINDINGS Brain: Generalized cerebral atrophy. Patchy and ill-defined hypoattenuation within the cerebral white matter, nonspecific but compatible with mild chronic small vessel ischemic disease. There is no acute intracranial hemorrhage. No demarcated cortical infarct. No extra-axial fluid collection. No evidence of an intracranial mass. No midline shift. Vascular: No hyperdense vessel.  Atherosclerotic calcifications. Skull: No calvarial fracture or aggressive osseous lesion. Other: Sizable forehead and periorbital hematoma on the left. Smaller left facial hematoma. CT MAXILLOFACIAL FINDINGS Osseous: Redemonstrated chronic medially displaced lamina papyracea fractures bilaterally. Displaced left nasal bone fracture, new from the prior head CT of  04/11/2023 but otherwise age-indeterminate. Orbits: Sizable forehead and periorbital hematoma on the left. The globes are normal in size and contour. No retrobulbar hematoma. Sinuses: Trace secretions within the bilateral maxillary sinuses. Minimal mucosal thickening scattered within bilateral ethmoid air cells. Soft tissues: Sizable forehead and periorbital hematoma on the left. Other: Incompletely assessed cervical spondylosis. Nonspecific reversal of the expected cervical lordosis. Grade 1 anterolisthesis at C2-C3, C3-C4 and C4-C5. Grade 1 retrolisthesis at C5-C6 and C6-C7. IMPRESSION: CT head: 1.  No evidence of an acute intracranial abnormality. 2. Parenchymal atrophy and chronic small vessel ischemic disease. 3. Sizable forehead and periorbital hematoma on the left. CT maxillofacial: 1. Displaced left nasal bone fracture, new from the prior head CT of 04/11/2023 but otherwise age-indeterminate. 2. Redemonstrated chronic medially displaced lamina papyracea fractures bilaterally. 3. Sizable forehead and periorbital hematoma on the left. 4. Smaller left facial hematoma. 5. Minor paranasal sinus disease as described. 6. Nonspecific reversal of the expected cervical lordosis. 7. Grade 1 spondylolisthesis at C2-C3, C3-C4, C4-C5, C5-C6 and C6-C7. 8. Incompletely assessed cervical spondylosis. Electronically Signed   By: Rockey Childs D.O.   On: 02/04/2024 09:09   CT Maxillofacial WO CM Result Date: 02/04/2024 CLINICAL DATA:  Provided history: Head trauma, minor. Facial trauma, blunt. Additional history provided: Syncopal episode. EXAM: CT HEAD WITHOUT CONTRAST CT MAXILLOFACIAL WITHOUT CONTRAST TECHNIQUE: Multidetector CT imaging of the head and maxillofacial structures were performed using the standard protocol without intravenous contrast. Multiplanar CT image reconstructions of the maxillofacial structures were also generated. RADIATION DOSE REDUCTION: This exam was performed according to the departmental  dose-optimization program which includes automated exposure control, adjustment of the mA and/or kV according to patient size and/or use of iterative reconstruction technique. COMPARISON:  Head CT 04/11/2023. FINDINGS: CT HEAD FINDINGS Brain: Generalized cerebral atrophy. Patchy and ill-defined hypoattenuation within the cerebral white matter, nonspecific but compatible with mild chronic small vessel ischemic disease. There is no acute intracranial hemorrhage. No demarcated cortical infarct. No extra-axial fluid collection. No evidence of an intracranial mass. No midline shift. Vascular: No hyperdense vessel.  Atherosclerotic calcifications. Skull: No calvarial fracture or aggressive osseous lesion. Other: Sizable forehead and periorbital hematoma on the left. Smaller left facial hematoma. CT MAXILLOFACIAL FINDINGS Osseous: Redemonstrated chronic medially displaced lamina papyracea fractures bilaterally. Displaced left nasal bone fracture, new from the prior head CT of 04/11/2023 but otherwise age-indeterminate. Orbits: Sizable forehead and periorbital hematoma on the left. The globes are normal in size and contour. No retrobulbar hematoma. Sinuses: Trace secretions within the bilateral maxillary sinuses. Minimal mucosal thickening scattered within bilateral ethmoid air cells. Soft tissues: Sizable forehead and periorbital hematoma on the left. Other: Incompletely assessed cervical spondylosis. Nonspecific reversal of the expected cervical lordosis. Grade 1 anterolisthesis at C2-C3, C3-C4 and C4-C5. Grade 1  retrolisthesis at C5-C6 and C6-C7. IMPRESSION: CT head: 1.  No evidence of an acute intracranial abnormality. 2. Parenchymal atrophy and chronic small vessel ischemic disease. 3. Sizable forehead and periorbital hematoma on the left. CT maxillofacial: 1. Displaced left nasal bone fracture, new from the prior head CT of 04/11/2023 but otherwise age-indeterminate. 2. Redemonstrated chronic medially displaced lamina  papyracea fractures bilaterally. 3. Sizable forehead and periorbital hematoma on the left. 4. Smaller left facial hematoma. 5. Minor paranasal sinus disease as described. 6. Nonspecific reversal of the expected cervical lordosis. 7. Grade 1 spondylolisthesis at C2-C3, C3-C4, C4-C5, C5-C6 and C6-C7. 8. Incompletely assessed cervical spondylosis. Electronically Signed   By: Rockey Childs D.O.   On: 02/04/2024 09:09    Microbiology: Results for orders placed or performed during the hospital encounter of 04/08/23  Resp panel by RT-PCR (RSV, Flu A&B, Covid) Anterior Nasal Swab     Status: None   Collection Time: 04/08/23  8:08 AM   Specimen: Anterior Nasal Swab  Result Value Ref Range Status   SARS Coronavirus 2 by RT PCR NEGATIVE NEGATIVE Final    Comment: (NOTE) SARS-CoV-2 target nucleic acids are NOT DETECTED.  The SARS-CoV-2 RNA is generally detectable in upper respiratory specimens during the acute phase of infection. The lowest concentration of SARS-CoV-2 viral copies this assay can detect is 138 copies/mL. A negative result does not preclude SARS-Cov-2 infection and should not be used as the sole basis for treatment or other patient management decisions. A negative result may occur with  improper specimen collection/handling, submission of specimen other than nasopharyngeal swab, presence of viral mutation(s) within the areas targeted by this assay, and inadequate number of viral copies(<138 copies/mL). A negative result must be combined with clinical observations, patient history, and epidemiological information. The expected result is Negative.  Fact Sheet for Patients:  BloggerCourse.com  Fact Sheet for Healthcare Providers:  SeriousBroker.it  This test is no t yet approved or cleared by the [place]  FDA and  has been authorized for detection and/or diagnosis of SARS-CoV-2 by FDA under an Emergency Use Authorization (EUA).  This EUA will remain  in effect (meaning this test can be used) for the duration of the COVID-19 declaration under Section 564(b)(1) of the Act, 21 U.S.C.section 360bbb-3(b)(1), unless the authorization is terminated  or revoked sooner.       Influenza A by PCR NEGATIVE NEGATIVE Final   Influenza B by PCR NEGATIVE NEGATIVE Final    Comment: (NOTE) The Xpert Xpress SARS-CoV-2/FLU/RSV plus assay is intended as an aid in the diagnosis of influenza from Nasopharyngeal swab specimens and should not be used as a sole basis for treatment. Nasal washings and aspirates are unacceptable for Xpert Xpress SARS-CoV-2/FLU/RSV testing.  Fact Sheet for Patients: BloggerCourse.com  Fact Sheet for Healthcare Providers: SeriousBroker.it  This test is not yet approved or cleared by the [place]  FDA and has been authorized for detection and/or diagnosis of SARS-CoV-2 by FDA under an Emergency Use Authorization (EUA). This EUA will remain in effect (meaning this test can be used) for the duration of the COVID-19 declaration under Section 564(b)(1) of the Act, 21 U.S.C. section 360bbb-3(b)(1), unless the authorization is terminated or revoked.     Resp Syncytial Virus by PCR NEGATIVE NEGATIVE Final    Comment: (NOTE) Fact Sheet for Patients: BloggerCourse.com  Fact Sheet for Healthcare Providers: SeriousBroker.it  This test is not yet approved or cleared by the [place]  FDA and has been authorized for detection and/or diagnosis of SARS-CoV-2  by FDA under an Emergency Use Authorization (EUA). This EUA will remain in effect (meaning this test can be used) for the duration of the COVID-19 declaration under Section 564(b)(1) of the Act, 21 U.S.C. section 360bbb-3(b)(1), unless the authorization is terminated or revoked.  Performed at [facility], [address], [place], [place]  [address]     Labs: CBC: Recent Labs  Lab 02/09/24 [phone] 02/10/24 0442 02/11/24 0431 02/12/24 0450  WBC 6.0 5.5 4.9 6.2  NEUTROABS 3.8  --   --   --   HGB 10.3* 10.5* 9.2* 10.1*  HCT 30.8* 32.1* 28.1* 30.9*  MCV 103.7* 103.2* 102.6* 105.1*  PLT 247 280 273 340   Basic Metabolic Panel: Recent Labs  Lab 02/09/24 0743 02/10/24 0442 02/11/24 0431 02/12/24 0450  NA 134* 136 140 139  K 3.3* 3.6 3.7 4.2  CL 104 107 110 108  CO2 20* 21* 20* 20*  GLUCOSE 88 93 83 86  BUN 5* 6* 9 10  CREATININE 0.83 0.68 0.67 0.73  CALCIUM  7.7* 7.7* 8.1* 8.8*  MG 1.3* 2.2  --   --    Liver Function Tests: No results for input(s): AST, ALT, ALKPHOS, BILITOT, PROT, ALBUMIN in the last 168 hours. CBG: No results for input(s): GLUCAP in the last 168 hours.  Discharge time spent: greater than 30 minutes.  Signed: Alm Schneider, MD [facility] 02/12/2024

## 2024-02-12 NOTE — Plan of Care (Signed)

## 2024-02-12 NOTE — Patient Instructions (Signed)
 Adaptive Equipment Note: If you have had a hip or knee replaced, dress  your surgical leg first (the side that was operated on).  Undress your surgical leg last. If you have had a hip  replaced, and you have hip precautions, follow your  own hip precautions when using this equipment. Reacher You can use a reacher to pick up light-weight objects  that are out of reach. You can also use it to get dressed: 1. Use the reacher to grasp the waistband of your  pants or skirt. For underwear, grasp around the leg  opening so it will be easy to get your foot through. 2. Lower the clothing to the floor. 3. Put your foot in the leg hole. (For skirts, put both  feet in.) 4. Pull the clothing up so you can reach it with  your hand. 5. If putting on pants or underwear, do the same for  the other leg. Dressing stick You can use a dressing stick to dress the lower part  of your body: 1. Hook the dressing stick through the belt loop at  the front of your pants. Or place the hook inside  the front of the waistband (for pants, skirt or  underwear). 2. Lower the clothing to the floor. 3. Put your foot in. (For skirts, put both feet in.) 4. Pull the clothing up so you can reach it with  your hand. 5. If putting on pants or underwear, do the same for  the other leg. To remove socks: 1. Slide the dressing stick  down into the sock, by  the heel. 2. Push the sock off. Page 2 of 4 Sock aid A sock aid lets you put your socks on without bending. 1. Put your sock on the sock aid. The heel goes on  the bottom. The toe of the sock should be pulled  tight against the sock aid. 2. Hold the straps with  both hands. 3. Lower the sock aid to  the floor. 4. Place your foot into  the sock aid. 5. Point your toes and  pull the straps until your sock is on. Long-handled shoehorn This lets you put your shoes on with  little bending. Simply place the  shoehorn in the heel of the  shoe. Then, slide your  heel  along the shoehorn to slip  your shoe on. Elastic shoelaces With elastic shoelaces,  you can slip your foot  into your shoe to avoid  bending or tying. The first  time you use them: 1. Thread the shoelaces  through your shoes. Be  sure to lace through the  tongue slot, if you have one. 2. Tie a knot or a double bow. Keep the laces tied at  all times. To put on shoes: 1. With the laces tied, slip your foot into the shoe. If  you have a reacher, use it to hold onto the tongue  of your shoe while you put it on. Or use a longhandled shoehorn and slide your foot into the shoe. 2. The laces will stretch as you put your foot in. They  will tighten around your foot once the shoe is on. Leg lifter This is mostly used to move your leg when getting in  and out of bed. If you need a leg lifter, your therapist  will give you one and show you how to use it. Page 3 of 4 Commode A commode can be used in different ways, so there  are different  kinds.  A "stationary" or "bedside" commode is used if  you have trouble getting to the bathroom toilet.  This commode is placed near the bed and has a  bucket that can be emptied into the toilet.  You can use an "all-in-one" or "3-in-1" commode  either at your bedside or in the bathroom. The  back is open, so you can remove the bucket and  place the commode  over the toilet as  needed. Putting it  over the toilet raises  the seat and gives you  handrails, making it  easier to sit down and  get up. You can make the legs of  either commode shorter or taller. Raised toilet seat A raised seat makes it easier to get on and off the  toilet. This is helpful to many people, including those  who have had a hip replaced. It keeps you from  bending too much at the hips as  you lower yourself onto the seat. It  also helps if you have trouble with  strength or balance when getting  on the toilet. Toilet safety frame With a safety frame, you   can push up from the  rails when getting on  and off the toilet. The  frame attaches to the  screws on your toilet  seat cover. You can make  the legs shorter or taller  as needed. Toilet aid If you have problems bending, twisting or reaching,  you can use a toilet aid to wipe yourself after using  the toilet. 1. Roll the toilet paper into a ball. 2. Grip part of the ball with the toilet aid. 3. Wipe. 4. Open the handle to drop  the used tissue into  the toilet. Grab bars You can install grab bars near your toilet and in your  bathtub or shower. This will give you something stable  to hold onto. Towel bars, soap dishes and shower  doors are not strong enough to hold your weight. Grab bars come in different sizes  and angles. Some clamp onto  the edge of the tub; others are  mounted to the studs in the wall. Page 4 of 4 Long-handled sponge It you have trouble bending  or twisting, the sponge will  help you wash your back, feet  and lower legs. Tub transfer bench  (also called an extended tub bench) A tub transfer bench is for anyone who has trouble  stepping into or out of the bathtub or who has  weight-bearing restrictions. 1. Walk up to the edge of the tub and turn around.  You should feel the bench at the back of  your knees. 2. Sit down on  the bench. 3. Staying seated, start  turning to face the  front of the tub. 4. Lift your feet over  the edge of the tub,  one at a time. (If you  have had your hip  replaced, do not lift  your knee higher than your hip.) 5. Reverse this process to get out of the tub. Tub or shower chair A tub or shower chair can help if  you have problems standing in  the shower or sitting at the  bottom of a bathtub. It  lets you sit on the chair  while you bathe. You  can use a hand-held  shower head along with  the chair. Hand-held shower head This lets you control the spray of  the water as you bathe. If you are  using a  shower chair or tub transfer  bench, you may need a hand-held  shower head. Therapist's name and phone number

## 2024-02-16 ENCOUNTER — Telehealth: Payer: Self-pay

## 2024-02-16 NOTE — Telephone Encounter (Signed)
 Called patient to complete TOC, I scheduled patient for first available Hospital follow up on 03/01/24. He is also scheduled to come in for acute visit on 02/26/24 for acid reflux, I was uncertain if patient needed to keep 02/26/24 appointment so advised patient I would send a message to office staff to confirm. Please call patient if he does not need 02/26/24 appointment. ( I was uncertain if provider would be able to address acute issues during hospital follow up, so I did not cancel 02/26/24 acute appointment). Thank you

## 2024-02-16 NOTE — Transitions of Care (Post Inpatient/ED Visit) (Signed)
 02/16/2024  Name: Jonathan Benitez MRN: [id number] DOB: 1950-10-20  Today's TOC FU Call Status: Today's TOC FU Call Status:: Successful TOC FU Call Completed TOC FU Call Complete Date: 02/16/24 Patient's Name and Date of Birth confirmed.  Transition Care Management Follow-up Telephone Call Date of Discharge: 02/12/24 [facility] Type of Discharge: Inpatient Admission Primary Inpatient Discharge Diagnosis:: uncontrolled pain How have you been since you were released from the hospital?: Better Any questions or concerns?: No  Items Reviewed: Did you receive and understand the discharge instructions provided?: Yes Medications obtained,verified, and reconciled?: Yes (Medications Reviewed) Any new allergies since your discharge?: No Dietary orders reviewed?: NA Do you have support at home?: Yes People in Home [RPT]: sibling(s)  Medications Reviewed Today: Medications Reviewed Today     Reviewed by Lang Avelina JINNY, CMA (Certified Medical Assistant) on 02/16/24 at 1118  Med List Status: <None>   Medication Order Taking? Sig Documenting Provider Last Dose Status Informant  allopurinol  (ZYLOPRIM ) 100 MG tablet [id number] No Take 200 mg by mouth daily. [provider] 02/08/2024 Morning Active Self, Pharmacy Records  carvedilol  (COREG ) 6.25 MG tablet [id number] No Take 6.25 mg by mouth 2 (two) times daily with a meal. [provider] 02/09/2024 Morning Active Self, Pharmacy Records  cetirizine (ZYRTEC) 10 MG tablet [id number] No Take 10 mg by mouth daily. [provider] 02/08/2024 Evening Active Self, Pharmacy Records           Med Note CHRISTIE ALYSON Schaumann Sep 13, 2021 12:38 PM)    diclofenac  Sodium (VOLTAREN ) 1 % GEL [id number] No Apply 4 g topically 4 (four) times daily. [provider] Unknown Active Self, Pharmacy Records  esomeprazole  (NEXIUM ) 40 MG capsule [id number] No Take 1 capsule (40 mg total) by mouth daily at 12  noon. Tobie Suzzane POUR, MD 02/08/2024 Noon Active Self, Pharmacy Records  fluticasone  (FLONASE ) 50 MCG/ACT nasal spray [id number] No Place 2 sprays into both nostrils daily as needed for allergies.  [provider] Unknown Active Self, Pharmacy Records  magnesium  oxide (MAG-OX) 400 (240 Mg) MG tablet [id number] No Take 1 tablet (400 mg total) by mouth 2 (two) times daily. Ricky Fines, MD Unknown Active Self, Pharmacy Records  oxyCODONE  (OXY IR/ROXICODONE ) 5 MG immediate release tablet [id number]  Take 1 tablet (5 mg total) by mouth every 6 (six) hours as needed for moderate pain (pain score 4-6). Evonnie Lenis, MD  Active   potassium chloride  SA (KLOR-CON  M) 20 MEQ tablet [id number] No Take 1 tablet (20 mEq total) by mouth daily.  Patient taking differently: Take 20 mEq by mouth 2 (two) times daily.   Tobie Suzzane POUR, MD 02/08/2024 Evening Active Self, Pharmacy Records  sucralfate  (CARAFATE ) 1 g tablet [id number] No Take 1 tablet (1 g total) by mouth 4 (four) times daily -  with meals and at bedtime. Tobie Suzzane POUR, MD 02/08/2024 Bedtime Active Self, Pharmacy Records  traZODone  (DESYREL ) 50 MG tablet [id number] No Take 100 mg by mouth at bedtime as needed for sleep. for sleep [provider] 02/08/2024 Bedtime Active Self, Pharmacy Records  triamcinolone ointment (KENALOG) 0.1 % [id number] No Apply 1 Application topically 4 (four) times daily as needed (itching). [provider] 02/08/2024 Morning Active Self, Pharmacy Records  Med List Note Elsworth Sharlet JAYSON Bishop 06/28/12 1410): Gets medications filled at the [facility] in [facility].            [facility] and Equipment/Supplies: Were Home Health Services Ordered?: NA Any  new equipment or medical supplies ordered?: NA  Functional Questionnaire: Do you need assistance with bathing/showering or dressing?: No Do you need assistance with meal preparation?: No Do you need assistance with eating?: No Do you have difficulty maintaining  continence: No Do you need assistance with getting out of bed/getting out of a chair/moving?: No Do you have difficulty managing or taking your medications?: No  Follow up appointments reviewed: PCP Follow-up appointment confirmed?: Yes Date of PCP follow-up appointment?: 03/01/24 Follow-up Provider: Dr. Tobie Specialist [facility] Follow-up appointment confirmed?: NA Do you need transportation to your follow-up appointment?: No Do you understand care options if your condition(s) worsen?: Yes-patient verbalized understanding    SIGNATURE Avelina Essex, CMA (AAMA)  [facility] [phone]

## 2024-02-18 ENCOUNTER — Encounter: Payer: Self-pay | Admitting: Internal Medicine

## 2024-02-26 ENCOUNTER — Ambulatory Visit: Payer: Self-pay | Admitting: Internal Medicine

## 2024-03-01 ENCOUNTER — Inpatient Hospital Stay: Admitting: Internal Medicine

## 2024-03-11 ENCOUNTER — Encounter: Payer: Self-pay | Admitting: Internal Medicine

## 2024-03-17 ENCOUNTER — Ambulatory Visit: Payer: Self-pay

## 2024-03-17 NOTE — Telephone Encounter (Signed)
 Appt scheduled 11/14. Pateint notified
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
## 2024-03-18 ENCOUNTER — Ambulatory Visit: Admitting: Internal Medicine

## 2024-03-26 ENCOUNTER — Ambulatory Visit: Admitting: Internal Medicine

## 2024-03-26 ENCOUNTER — Ambulatory Visit: Payer: Self-pay
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
## 2024-03-27 ENCOUNTER — Ambulatory Visit
Admission: RE | Admit: 2024-03-27 | Discharge: 2024-03-27 | Disposition: A | Discharge status: Home / Self Care | Payer: Self-pay | Source: Ambulatory Visit | Attending: Family Medicine | Admitting: Family Medicine

## 2024-03-27 VITALS — BP 144/84 | HR 93 | Temp 98.8°F | Resp 20

## 2024-03-27 DIAGNOSIS — K047 Periapical abscess without sinus: Secondary | ICD-10-CM

## 2024-03-27 MED ORDER — CHLORHEXIDINE GLUCONATE 0.12 % MT SOLN: 15.0000 mL | Freq: Two times a day (BID) | OROMUCOSAL | 0 refills | Status: AC

## 2024-03-27 MED ORDER — LIDOCAINE VISCOUS HCL 2 % MT SOLN: 10.0000 mL | OROMUCOSAL | 0 refills | Status: AC | PRN

## 2024-03-27 MED ORDER — AMOXICILLIN-POT CLAVULANATE 875-125 MG PO TABS: 1.0000 | ORAL_TABLET | Freq: Two times a day (BID) | ORAL | 0 refills | Status: AC

## 2024-03-27 NOTE — ED Triage Notes (Signed)
 Pt reports right side facial swelling, and pain,  that started x 1 week. Has been doping warm salt water  gargles but has found no relief.

## 2024-03-27 NOTE — Discharge Instructions (Signed)
 Follow-up with dentist to soon as possible

## 2024-03-27 NOTE — ED Provider Notes (Signed)
 [facility]    CSN: [id number] Arrival date & time: 03/27/24  1159      History   Chief Complaint Chief Complaint  Patient presents with   Dental Problem    Entered by patient    HPI Jonathan Benitez is a 73 y.o. male.   Patient presenting today with 1 week history of right upper dental pain, facial swelling.  Has dental implants in this area and multiple dental issues, working on getting in with a dentist but states he is having issues getting into the dentist.  Does have some facial swelling in the area but denies fever, chills, difficulty breathing or swelling, bleeding, drainage.  Trying warm salt water  gargles with no relief.    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    Patient Active Problem List   Diagnosis Date Noted   Hypomagnesemia 02/10/2024   Uncontrolled pain 02/09/2024   Inferior pubic ramus fracture, left, sequela 02/09/2024   Chronic diastolic heart failure (HCC) 02/09/2024   Hyponatremia 02/09/2024   Chronic mesenteric ischemia 01/23/2024   Hypokalemia 08/06/2023   NSTEMI (non-ST elevated myocardial infarction) 04/08/2023   History of recreational drug use 04/08/2023   History of alcohol abuse 04/08/2023   History of prostate cancer 04/08/2023   History of gout 04/08/2023   HFrEF (heart failure with reduced ejection fraction) 04/08/2023   Acute gout of left foot 01/31/2023   Benign prostatic hyperplasia with nocturia 01/31/2023   Mixed hyperlipidemia 01/31/2023   CAP (community acquired pneumonia) 11/08/2022   Acute mucoid otitis media of right ear 03/14/2022   Decreased appetite 02/27/2022   Superior mesenteric artery stenosis 01/30/2022   Hospital discharge follow-up 01/30/2022   Proctitis 01/21/2022   Cardiomyopathy (HCC) 01/20/2022   AKI (acute kidney injury) 01/19/2022    Failure to thrive in adult 01/19/2022   Marijuana abuse 01/19/2022   Elevated troponin 01/19/2022   Acute idiopathic gout of left ankle 05/01/2021   Iron deficiency anemia due to chronic blood loss 02/14/2021   Neck pain 02/14/2021   Bunion of great toe of right foot 02/14/2021   Primary insomnia 09/07/2020   Chronic diarrhea 05/19/2020   Arthritis of knee 05/19/2020   Moderate protein-calorie malnutrition 05/19/2020   Cyclic vomiting syndrome 05/19/2020   Rectal bleeding 03/25/2018   Avascular necrosis of hip, left (HCC) 11/12/2017   Gastroesophageal reflux disease 12/19/2016   Anemia 06/28/2012   Prostate cancer (HCC) 06/28/2012   HTN (hypertension) 06/28/2012   Crohn's disease (HCC) 06/28/2012   Alcoholism (HCC) 06/28/2012    Past Surgical History:  Procedure Laterality Date   COLONOSCOPY N/A 03/30/2018   Procedure: COLONOSCOPY;  Surgeon: Golda Claudis PENNER, MD;  Location: [facility];  Service: Endoscopy;  Laterality: N/A;  12:45   COLONOSCOPY WITH PROPOFOL  N/A 01/23/2022   Procedure: COLONOSCOPY WITH PROPOFOL ;  Surgeon: Cindie Carlin POUR, DO;  Location: [facility];  Service: Endoscopy;  Laterality: N/A;   HERNIA REPAIR     [place]   LEFT HEART CATH AND CORONARY ANGIOGRAPHY N/A 04/09/2023   Procedure: LEFT HEART CATH AND CORONARY ANGIOGRAPHY;  Surgeon: Jordan, Peter M, MD;  Location: [facility];  Service: Cardiovascular;  Laterality: N/A;   POLYPECTOMY  03/30/2018   Procedure: POLYPECTOMY;  Surgeon: Golda Claudis PENNER, MD;  Location: [facility];  Service: Endoscopy;;  colon   PROSTATE BIOPSY     positive for cancer   TOTAL HIP ARTHROPLASTY Left    [facility] 11/07/17 Dr. Jayson Crome       Home Medications    Prior to Admission medications   Medication Sig Start Date End Date Taking? Authorizing Provider  amoxicillin -clavulanate (AUGMENTIN ) 875-125 MG tablet Take 1 tablet by mouth every 12 (twelve) hours. 03/27/24  Yes Stuart Vernell Norris, PA-C   chlorhexidine (PERIDEX) 0.12 % solution Use as directed 15 mLs in the mouth or throat 2 (two) times daily. 03/27/24  Yes Stuart Vernell Norris, PA-C  lidocaine  (XYLOCAINE ) 2 % solution Use as directed 10 mLs in the mouth or throat every 3 (three) hours as needed. 03/27/24  Yes Stuart Vernell Norris, PA-C  allopurinol  (ZYLOPRIM ) 100 MG tablet Take 200 mg by mouth daily. 04/10/22   [provider]  carvedilol  (COREG ) 6.25 MG tablet Take 6.25 mg by mouth 2 (two) times daily with a meal. 07/18/23   [provider]  cetirizine (ZYRTEC) 10 MG tablet Take 10 mg by mouth daily.    [provider]  diclofenac  Sodium (VOLTAREN ) 1 % GEL Apply 4 g topically 4 (four) times daily. 02/19/23   [provider]  esomeprazole  (NEXIUM ) 40 MG capsule Take 1 capsule (40 mg total) by mouth daily at 12 noon. 01/23/24   Tobie Suzzane POUR, MD  fluticasone  (FLONASE ) 50 MCG/ACT nasal spray Place 2 sprays into both nostrils daily as needed for allergies.     [provider]  magnesium  oxide (MAG-OX) 400 (240 Mg) MG tablet Take 1 tablet (400 mg total) by mouth 2 (two) times daily. 01/23/22   Ricky Fines, MD  oxyCODONE  (OXY IR/ROXICODONE ) 5 MG immediate release tablet Take 1 tablet (5 mg total) by mouth every 6 (six) hours as needed for moderate pain (pain score 4-6). 02/12/24   Evonnie Lenis, MD  potassium chloride  SA (KLOR-CON  M) 20 MEQ tablet Take 1 tablet (20 mEq total) by mouth daily. Patient taking differently: Take 20 mEq by mouth 2 (two) times daily. 08/06/23   Tobie Suzzane POUR, MD  sucralfate  (CARAFATE ) 1 g tablet Take 1 tablet (1 g total) by mouth 4 (four) times daily -  with meals and at bedtime. 01/23/24   Tobie Suzzane POUR, MD  traZODone  (DESYREL ) 50 MG tablet Take 100 mg by mouth at bedtime as needed for sleep. for sleep 02/19/23   [provider]  triamcinolone ointment (KENALOG) 0.1 % Apply 1 Application topically 4 (four) times daily as needed (itching). 06/21/22   [provider]    Family History Family History  Problem Relation Age of Onset   Hypertension Mother    Coronary artery disease Mother    Hypertension Father    Stroke Father    Colon cancer Neg Hx    Colon polyps Neg Hx     Social History Social History   Tobacco Use   Smoking status: Never   Smokeless tobacco: Never  Vaping Use   Vaping status: Never Used  Substance Use Topics   Alcohol use: Yes    Comment: was drinking 3-4 beers a day   Drug use: Yes    Types: Marijuana    Comment: Smokes a joint every day     Allergies   Nsaids   Review of Systems Review of Systems Per HPI  Physical Exam Triage Vital Signs ED Triage Vitals  Encounter Vitals Group  BP 03/27/24 1216 (!) 144/84     Girls Systolic BP Percentile --      Girls Diastolic BP Percentile --      Boys Systolic BP Percentile --      Boys Diastolic BP Percentile --      Pulse Rate 03/27/24 1216 93     Resp 03/27/24 1216 20     Temp 03/27/24 1216 98.8 F (37.1 C)     Temp Source 03/27/24 1216 Oral     SpO2 03/27/24 1216 97 %     Weight --      Height --      Head Circumference --      Peak Flow --      Pain Score 03/27/24 1219 8     Pain Loc --      Pain Education --      Exclude from Growth Chart --    No data found.  Updated Vital Signs BP (!) 144/84 (BP Location: Right Arm)   Pulse 93   Temp 98.8 F (37.1 C) (Oral)   Resp 20   SpO2 97%   Visual Acuity Right Eye Distance:   Left Eye Distance:   Bilateral Distance:    Right Eye Near:   Left Eye Near:    Bilateral Near:     Physical Exam Vitals and nursing note reviewed.  Constitutional:      Appearance: Normal appearance.  HENT:     Head: Atraumatic.     Nose: Nose normal.     Mouth/Throat:     Mouth: Mucous membranes are moist.     Pharynx: Oropharynx is clear.     Comments: Poor dentition, gingival erythema and edema to the right upper jaw in the molar region Eyes:     Extraocular Movements: Extraocular  movements intact.     Conjunctiva/sclera: Conjunctivae normal.  Cardiovascular:     Rate and Rhythm: Normal rate.  Pulmonary:     Effort: Pulmonary effort is normal.  Musculoskeletal:        General: Normal range of motion.     Cervical back: Normal range of motion and neck supple.  Skin:    General: Skin is warm and dry.  Neurological:     Mental Status: He is oriented to person, place, and time.  Psychiatric:        Mood and Affect: Mood normal.        Thought Content: Thought content normal.        Judgment: Judgment normal.      UC Treatments / Results  Labs (all labs ordered are listed, but only abnormal results are displayed) Labs Reviewed - No data to display  EKG   Radiology No results found.  Procedures Procedures (including critical care time)  Medications Ordered in UC Medications - No data to display  Initial Impression / Assessment and Plan / UC Course  I have reviewed the triage vital signs and the nursing notes.  Pertinent labs & imaging results that were available during my care of the patient were reviewed by me and considered in my medical decision making (see chart for details).     Will treat with Augmentin , Peridex rinse, viscous lidocaine  and have close into follow-up.  Return for worsening or unresolving symptoms.  Final Clinical Impressions(s) / UC Diagnoses   Final diagnoses:  Dental infection     Discharge Instructions      Follow-up with dentist to soon as possible    ED Prescriptions  Medication Sig Dispense Auth. Provider   amoxicillin -clavulanate (AUGMENTIN ) 875-125 MG tablet Take 1 tablet by mouth every 12 (twelve) hours. 14 tablet Stuart Vernell Norris, PA-C   chlorhexidine (PERIDEX) 0.12 % solution Use as directed 15 mLs in the mouth or throat 2 (two) times daily. 120 mL Stuart Vernell Norris, PA-C   lidocaine  (XYLOCAINE ) 2 % solution Use as directed 10 mLs in the mouth or throat every 3 (three) hours as needed.  100 mL Stuart Vernell Norris, [place]      PDMP not reviewed this encounter.   Stuart Vernell Norris, [place] 03/27/24 1404

## 2024-05-26 ENCOUNTER — Other Ambulatory Visit (HOSPITAL_COMMUNITY): Payer: Self-pay

## 2024-05-26 DIAGNOSIS — Z1289 Encounter for screening for malignant neoplasm of other sites: Secondary | ICD-10-CM
